# Patient Record
Sex: Male | Born: 1980
Health system: Southern US, Community
[De-identification: ages and names within clinical notes are randomized; demographics above are authoritative.]

## PROBLEM LIST (undated history)

## (undated) DIAGNOSIS — F329 Major depressive disorder, single episode, unspecified: Secondary | ICD-10-CM

## (undated) DIAGNOSIS — R7303 Prediabetes: Secondary | ICD-10-CM

## (undated) DIAGNOSIS — D869 Sarcoidosis, unspecified: Secondary | ICD-10-CM

## (undated) DIAGNOSIS — F419 Anxiety disorder, unspecified: Secondary | ICD-10-CM

## (undated) DIAGNOSIS — G4730 Sleep apnea, unspecified: Secondary | ICD-10-CM

## (undated) DIAGNOSIS — J189 Pneumonia, unspecified organism: Secondary | ICD-10-CM

## (undated) DIAGNOSIS — R0600 Dyspnea, unspecified: Secondary | ICD-10-CM

## (undated) DIAGNOSIS — R519 Headache, unspecified: Secondary | ICD-10-CM

## (undated) DIAGNOSIS — F32A Depression, unspecified: Secondary | ICD-10-CM

## (undated) DIAGNOSIS — L309 Dermatitis, unspecified: Secondary | ICD-10-CM

## (undated) DIAGNOSIS — R51 Headache: Secondary | ICD-10-CM

## (undated) DIAGNOSIS — E669 Obesity, unspecified: Secondary | ICD-10-CM

## (undated) HISTORY — PX: OTHER SURGICAL HISTORY: SHX169

## (undated) HISTORY — PX: ARTHROSCOPY KNEE W/ DRILLING: SUR92

## (undated) HISTORY — DX: Dermatitis, unspecified: L30.9

---
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
## 2003-06-20 ENCOUNTER — Emergency Department (HOSPITAL_COMMUNITY): Admission: EM | Admit: 2003-06-20 | Discharge: 2003-06-20 | Payer: Self-pay | Admitting: Emergency Medicine

## 2005-02-20 ENCOUNTER — Emergency Department (HOSPITAL_COMMUNITY): Admission: EM | Admit: 2005-02-20 | Discharge: 2005-02-20 | Payer: Self-pay | Admitting: Emergency Medicine

## 2008-03-29 ENCOUNTER — Emergency Department (HOSPITAL_COMMUNITY): Admission: EM | Admit: 2008-03-29 | Discharge: 2008-03-29 | Payer: Self-pay | Admitting: Emergency Medicine

## 2009-09-21 ENCOUNTER — Emergency Department (HOSPITAL_COMMUNITY): Admission: EM | Admit: 2009-09-21 | Discharge: 2009-09-21 | Payer: Self-pay | Admitting: Family Medicine

## 2011-07-04 ENCOUNTER — Emergency Department (HOSPITAL_COMMUNITY)
Admission: EM | Admit: 2011-07-04 | Discharge: 2011-07-04 | Disposition: A | Discharge status: Home / Self Care | Payer: BC Managed Care – PPO | Attending: Emergency Medicine | Admitting: Emergency Medicine

## 2011-07-04 ENCOUNTER — Encounter (HOSPITAL_COMMUNITY): Payer: Self-pay | Admitting: Emergency Medicine

## 2011-07-04 DIAGNOSIS — R51 Headache: Secondary | ICD-10-CM | POA: Insufficient documentation

## 2011-07-04 DIAGNOSIS — F17200 Nicotine dependence, unspecified, uncomplicated: Secondary | ICD-10-CM | POA: Insufficient documentation

## 2011-07-04 HISTORY — DX: Obesity, unspecified: E66.9

## 2011-07-04 LAB — GLUCOSE, CAPILLARY: Glucose-Capillary: 104 mg/dL — ABNORMAL HIGH (ref 70–99)

## 2011-07-04 MED ORDER — IBUPROFEN 200 MG PO TABS
400.0000 mg | ORAL_TABLET | Freq: Once | ORAL | Status: AC
  Administered 2011-07-04: 400 mg via ORAL
  Filled 2011-07-04: qty 2

## 2011-07-04 MED ORDER — DIPHENHYDRAMINE HCL 25 MG PO CAPS
25.0000 mg | ORAL_CAPSULE | Freq: Once | ORAL | Status: AC
  Administered 2011-07-04: 25 mg via ORAL
  Filled 2011-07-04: qty 1

## 2011-07-04 MED ORDER — METOCLOPRAMIDE HCL 10 MG PO TABS
10.0000 mg | ORAL_TABLET | Freq: Once | ORAL | Status: AC
  Administered 2011-07-04: 10 mg via ORAL
  Filled 2011-07-04: qty 1

## 2011-07-04 NOTE — ED Notes (Signed)
Pt c/o severe headache beginning yesterday at noon. Pt states that he was informed he had high blood pressure. Pt c/o increase weakness since headache began. No nausea, no vomiting.

## 2011-07-04 NOTE — Discharge Instructions (Signed)
Headaches, Frequently Asked Questions MIGRAINE HEADACHES Q: What is migraine? What causes it? How can I treat it? A: Generally, migraine headaches begin as a dull ache. Then they develop into a constant, throbbing, and pulsating pain. You may experience pain at the temples. You may experience pain at the front or back of one or both sides of the head. The pain is usually accompanied by a combination of:  Nausea.   Vomiting.   Sensitivity to light and noise.  Some people (about 15%) experience an aura (see below) before an attack. The cause of migraine is believed to be chemical reactions in the brain. Treatment for migraine may include over-the-counter or prescription medications. It may also include self-help techniques. These include relaxation training and biofeedback.  Q: What is an aura? A: About 15% of people with migraine get an "aura". This is a sign of neurological symptoms that occur before a migraine headache. You may see wavy or jagged lines, dots, or flashing lights. You might experience tunnel vision or blind spots in one or both eyes. The aura can include visual or auditory hallucinations (something imagined). It may include disruptions in smell (such as strange odors), taste or touch. Other symptoms include:  Numbness.   A "pins and needles" sensation.   Difficulty in recalling or speaking the correct word.  These neurological events may last as long as 60 minutes. These symptoms will fade as the headache begins. Q: What is a trigger? A: Certain physical or environmental factors can lead to or "trigger" a migraine. These include:  Foods.   Hormonal changes.   Weather.   Stress.  It is important to remember that triggers are different for everyone. To help prevent migraine attacks, you need to figure out which triggers affect you. Keep a headache diary. This is a good way to track triggers. The diary will help you talk to your healthcare professional about your  condition. Q: Does weather affect migraines? A: Bright sunshine, hot, humid conditions, and drastic changes in barometric pressure may lead to, or "trigger," a migraine attack in some people. But studies have shown that weather does not act as a trigger for everyone with migraines. Q: What is the link between migraine and hormones? A: Hormones start and regulate many of your body's functions. Hormones keep your body in balance within a constantly changing environment. The levels of hormones in your body are unbalanced at times. Examples are during menstruation, pregnancy, or menopause. That can lead to a migraine attack. In fact, about three quarters of all women with migraine report that their attacks are related to the menstrual cycle.  Q: Is there an increased risk of stroke for migraine sufferers? A: The likelihood of a migraine attack causing a stroke is very remote. That is not to say that migraine sufferers cannot have a stroke associated with their migraines. In persons under age 40, the most common associated factor for stroke is migraine headache. But over the course of a person's normal life span, the occurrence of migraine headache may actually be associated with a reduced risk of dying from cerebrovascular disease due to stroke.  Q: What are acute medications for migraine? A: Acute medications are used to treat the pain of the headache after it has started. Examples over-the-counter medications, NSAIDs, ergots, and triptans.  Q: What are the triptans? A: Triptans are the newest class of abortive medications. They are specifically targeted to treat migraine. Triptans are vasoconstrictors. They moderate some chemical reactions in the brain.   The triptans work on receptors in your brain. Triptans help to restore the balance of a neurotransmitter called serotonin. Fluctuations in levels of serotonin are thought to be a main cause of migraine.  Q: Are over-the-counter medications for migraine  effective? A: Over-the-counter, or "OTC," medications may be effective in relieving mild to moderate pain and associated symptoms of migraine. But you should see your caregiver before beginning any treatment regimen for migraine.  Q: What are preventive medications for migraine? A: Preventive medications for migraine are sometimes referred to as "prophylactic" treatments. They are used to reduce the frequency, severity, and length of migraine attacks. Examples of preventive medications include antiepileptic medications, antidepressants, beta-blockers, calcium channel blockers, and NSAIDs (nonsteroidal anti-inflammatory drugs). Q: Why are anticonvulsants used to treat migraine? A: During the past few years, there has been an increased interest in antiepileptic drugs for the prevention of migraine. They are sometimes referred to as "anticonvulsants". Both epilepsy and migraine may be caused by similar reactions in the brain.  Q: Why are antidepressants used to treat migraine? A: Antidepressants are typically used to treat people with depression. They may reduce migraine frequency by regulating chemical levels, such as serotonin, in the brain.  Q: What alternative therapies are used to treat migraine? A: The term "alternative therapies" is often used to describe treatments considered outside the scope of conventional Western medicine. Examples of alternative therapy include acupuncture, acupressure, and yoga. Another common alternative treatment is herbal therapy. Some herbs are believed to relieve headache pain. Always discuss alternative therapies with your caregiver before proceeding. Some herbal products contain arsenic and other toxins. TENSION HEADACHES Q: What is a tension-type headache? What causes it? How can I treat it? A: Tension-type headaches occur randomly. They are often the result of temporary stress, anxiety, fatigue, or anger. Symptoms include soreness in your temples, a tightening  band-like sensation around your head (a "vice-like" ache). Symptoms can also include a pulling feeling, pressure sensations, and contracting head and neck muscles. The headache begins in your forehead, temples, or the back of your head and neck. Treatment for tension-type headache may include over-the-counter or prescription medications. Treatment may also include self-help techniques such as relaxation training and biofeedback. CLUSTER HEADACHES Q: What is a cluster headache? What causes it? How can I treat it? A: Cluster headache gets its name because the attacks come in groups. The pain arrives with little, if any, warning. It is usually on one side of the head. A tearing or bloodshot eye and a runny nose on the same side of the headache may also accompany the pain. Cluster headaches are believed to be caused by chemical reactions in the brain. They have been described as the most severe and intense of any headache type. Treatment for cluster headache includes prescription medication and oxygen. SINUS HEADACHES Q: What is a sinus headache? What causes it? How can I treat it? A: When a cavity in the bones of the face and skull (a sinus) becomes inflamed, the inflammation will cause localized pain. This condition is usually the result of an allergic reaction, a tumor, or an infection. If your headache is caused by a sinus blockage, such as an infection, you will probably have a fever. An x-ray will confirm a sinus blockage. Your caregiver's treatment might include antibiotics for the infection, as well as antihistamines or decongestants.  REBOUND HEADACHES Q: What is a rebound headache? What causes it? How can I treat it? A: A pattern of taking acute headache medications too   often can lead to a condition known as "rebound headache." A pattern of taking too much headache medication includes taking it more than 2 days per week or in excessive amounts. That means more than the label or a caregiver advises.  With rebound headaches, your medications not only stop relieving pain, they actually begin to cause headaches. Doctors treat rebound headache by tapering the medication that is being overused. Sometimes your caregiver will gradually substitute a different type of treatment or medication. Stopping may be a challenge. Regularly overusing a medication increases the potential for serious side effects. Consult a caregiver if you regularly use headache medications more than 2 days per week or more than the label advises. ADDITIONAL QUESTIONS AND ANSWERS Q: What is biofeedback? A: Biofeedback is a self-help treatment. Biofeedback uses special equipment to monitor your body's involuntary physical responses. Biofeedback monitors:  Breathing.   Pulse.   Heart rate.   Temperature.   Muscle tension.   Brain activity.  Biofeedback helps you refine and perfect your relaxation exercises. You learn to control the physical responses that are related to stress. Once the technique has been mastered, you do not need the equipment any more. Q: Are headaches hereditary? A: Four out of five (80%) of people that suffer report a family history of migraine. Scientists are not sure if this is genetic or a family predisposition. Despite the uncertainty, a child has a 50% chance of having migraine if one parent suffers. The child has a 75% chance if both parents suffer.  Q: Can children get headaches? A: By the time they reach high school, most young people have experienced some type of headache. Many safe and effective approaches or medications can prevent a headache from occurring or stop it after it has begun.  Q: What type of doctor should I see to diagnose and treat my headache? A: Start with your primary caregiver. Discuss his or her experience and approach to headaches. Discuss methods of classification, diagnosis, and treatment. Your caregiver may decide to recommend you to a headache specialist, depending upon  your symptoms or other physical conditions. Having diabetes, allergies, etc., may require a more comprehensive and inclusive approach to your headache. The National Headache Foundation will provide, upon request, a list of [facility] physician members in your state. Document Released: 07/20/2003 Document Revised: 01/09/2011 Document Reviewed: 12/28/2007 [facility] Patient Information 2012 [place], [place].General Headache, Without Cause A general headache has no specific cause. These headaches are not life-threatening. They will not lead to other types of headaches. HOME CARE   Make and keep follow-up visits with your doctor.   Only take medicine as told by your doctor.   Try to relax, get a massage, or use your thoughts to control your body (biofeedback).   Apply cold or heat to the head and neck. Apply 3 or 4 times a day or as needed.  Finding out the results of your test Ask when your test results will be ready. Make sure you get your test results. GET HELP RIGHT AWAY IF:   You have problems with medicine.   Your medicine does not help relieve pain.   Your headache changes or becomes worse.   You feel sick to your stomach (nauseous) or throw up (vomit).   You have a temperature by mouth above 102 F (38.9 C), not controlled by medicine.   Your have a stiff neck.   You have vision loss.   You have muscle weakness.   You lose control of your  muscles.   You lose balance or have trouble walking.   You feel like you are going to pass out (faint).  MAKE SURE YOU:   Understand these instructions.   Will watch this condition.   Will get help right away if you are not doing well or get worse.  Document Released: 02/06/2008 Document Revised: 01/09/2011 Document Reviewed: 02/06/2008 [facility] Patient Information 2012 [place], [place].  RESOURCE GUIDE  Dental Problems  Patients with Medicaid: [facility] [phone]                                            [phone] [address] Phone:  [phone]                                                  Phone:  [phone]  If unable to pay or uninsured, contact:  [facility] or [facility]. to become qualified for the adult dental clinic.  Chronic Pain Problems Contact [name]  [phone] Patients need to be referred by their primary care doctor.  Insufficient Money for Medicine Contact [employer]:  call "211" or [facility] [phone]) 023-0104.  No Primary Care Doctor Call [facility]  234-726-6418 Other agencies that provide inexpensive medical care    [facility]  [phone]    [facility]  [phone]    [facility]  [phone]    [facility]  [phone]    [facility]  [phone]    [facility]  [phone]  Psychological Services [facility]  [phone] [facility] Services  [phone] [facility] Mental [facility]   9841032621 (emergency services 680-575-1071)  Substance Abuse Resources [facility]  5415162938 [facility] [phone] [facility] [place] [phone] Floydene Flock [phone] Residential & Outpatient Substance Abuse Program  4062066255  Abuse/Neglect [facility] Child Abuse Hotline (757)855-8575 [facility] Child Abuse Hotline (810)690-1233 (After Hours)  Emergency Shelter [facility] 607-156-9647  Maternity Homes Room at the [place] [facility] (623)227-7040 [facility] ([phone]  MRSA Hotline #:   908 717 5389    [facility] Resources  [facility]     [employer]                          [facility] Dept. [address] [place]                       [address]      [address]  Patrecia Pace  [facility] Phone:  [phone]                                    Phone:  [phone]                 Phone:  [place] Phone:  [place] [phone] (After Hours)

## 2011-07-04 NOTE — ED Provider Notes (Signed)
History    30yM with HA. Gradual onset yesterday afternoon. Cannot remember what was doing at onset. Pain is diffuse, but worse in frontal region. Denies trauma. No appreciable exacerbating or relieving factors.  No n/v. No fever or chills. No neck pain or stiffness. No numbness, tingling or loss of strength. No sick contacts. No n/v.  Not on blood thinning medication. Not aware of family hx of cerebral aneurysm. No significant HA history. Pt concerned that may be related to BP because told on recent outpt evaluation that was high at that time. Pt also consider he may be diabetic.  CSN: [id number]  Arrival date & time 07/04/11  1034   First MD Initiated Contact with Patient 07/04/11 1041      Chief Complaint  Patient presents with  . Headache    (Consider location/radiation/quality/duration/timing/severity/associated sxs/prior treatment) HPI  Past Medical History  Diagnosis Date  . Obese     Past Surgical History  Procedure Date  . Arthroscopy knee w/ drilling     Family History  Problem Relation Age of Onset  . Hypertension Father   . Diabetes Father   . Stroke Father   . Cancer Paternal Uncle     History  Substance Use Topics  . Smoking status: Current Some Day Smoker -- 1.0 packs/day for 10 years    Types: Cigarettes  . Smokeless tobacco: Never Used  . Alcohol Use: No      Review of Systems   Review of symptoms negative unless otherwise noted in HPI.   Allergies  Review of patient's allergies indicates no known allergies.  Home Medications   Current Outpatient Rx  Name Route Sig Dispense Refill  . NAPROXEN SODIUM 220 MG PO TABS Oral Take 220 mg by mouth 2 (two) times daily with a meal.    . OXYCODONE-ACETAMINOPHEN 10-325 MG PO TABS Oral Take 1 tablet by mouth every 6 (six) hours as needed. pain      BP 112/46  Pulse 78  Temp(Src) 97.8 F (36.6 C) (Oral)  Ht 5\' 8"  (1.727 m)  Wt 470 lb (213.191 kg)  BMI 71.46 kg/m2  SpO2 97%  Physical Exam    Nursing note and vitals reviewed. Constitutional: He is oriented to person, place, and time. He appears well-developed and well-nourished. No distress.       Sitting next to bed. NAD. Morbidly obese.  HENT:  Head: Normocephalic and atraumatic.  Right Ear: External ear normal.  Left Ear: External ear normal.  Mouth/Throat: Oropharynx is clear and moist.       No facial tenderness.  Eyes: Conjunctivae and EOM are normal. Pupils are equal, round, and reactive to light. Right eye exhibits no discharge. Left eye exhibits no discharge.  Neck: Normal range of motion. Neck supple.  Cardiovascular: Normal rate, regular rhythm and normal heart sounds.  Exam reveals no gallop and no friction rub.   No murmur heard. Pulmonary/Chest: Effort normal and breath sounds normal. No respiratory distress.  Abdominal: Soft. He exhibits no distension. There is no tenderness.  Musculoskeletal: Normal range of motion. He exhibits no edema and no tenderness.  Lymphadenopathy:    He has no cervical adenopathy.  Neurological: He is alert and oriented to person, place, and time. No cranial nerve deficit. He exhibits normal muscle tone. Coordination normal.       Steady gait. Good finger to nose testing bilaterally.  Skin: Skin is warm and dry.  Psychiatric: He has a normal mood and affect. His behavior is normal.  Thought content normal.    ED Course  Procedures (including critical care time)  Labs Reviewed  GLUCOSE, CAPILLARY - Abnormal; Notable for the following:    Glucose-Capillary 104 (*)    All other components within normal limits   No results found.   1. Headache       MDM  30yM with HA. Suspect primary HA. Consider secondary causes such as infectious, bleed or mass but doubt. Consider venous thrombosis, carotid/vertebral dissection or ocular etiology but doubt as well. No contacts with similar to suggest CO poisoning. No hx of trauma. Not sudden onset or worst HA of life. Not on blood thinning  medications. Pt concerned about BP being elevated when checked as outpt recently but non concerning today. Also concerned about possible DM but less likely with nonfasting sugar of 104. Non focal neurological exam. Afebrile and no signs of meningismus. Plan continued symptomatic tx and fu as needed. Return precautions discussed.        Raeford Razor, MD 07/08/11 [phone]

## 2013-01-07 ENCOUNTER — Emergency Department (HOSPITAL_COMMUNITY): Payer: Commercial Managed Care - PPO

## 2013-01-07 ENCOUNTER — Encounter (HOSPITAL_COMMUNITY): Payer: Self-pay

## 2013-01-07 ENCOUNTER — Emergency Department (HOSPITAL_COMMUNITY)
Admission: EM | Admit: 2013-01-07 | Discharge: 2013-01-07 | Disposition: A | Discharge status: Home / Self Care | Payer: Commercial Managed Care - PPO | Attending: Emergency Medicine | Admitting: Emergency Medicine

## 2013-01-07 DIAGNOSIS — H5789 Other specified disorders of eye and adnexa: Secondary | ICD-10-CM | POA: Insufficient documentation

## 2013-01-07 DIAGNOSIS — R110 Nausea: Secondary | ICD-10-CM | POA: Insufficient documentation

## 2013-01-07 DIAGNOSIS — R220 Localized swelling, mass and lump, head: Secondary | ICD-10-CM | POA: Insufficient documentation

## 2013-01-07 DIAGNOSIS — E669 Obesity, unspecified: Secondary | ICD-10-CM | POA: Insufficient documentation

## 2013-01-07 DIAGNOSIS — J029 Acute pharyngitis, unspecified: Secondary | ICD-10-CM | POA: Insufficient documentation

## 2013-01-07 DIAGNOSIS — F17200 Nicotine dependence, unspecified, uncomplicated: Secondary | ICD-10-CM | POA: Insufficient documentation

## 2013-01-07 DIAGNOSIS — R509 Fever, unspecified: Secondary | ICD-10-CM | POA: Insufficient documentation

## 2013-01-07 DIAGNOSIS — M542 Cervicalgia: Secondary | ICD-10-CM | POA: Insufficient documentation

## 2013-01-07 DIAGNOSIS — R519 Headache, unspecified: Secondary | ICD-10-CM

## 2013-01-07 DIAGNOSIS — R51 Headache: Secondary | ICD-10-CM | POA: Insufficient documentation

## 2013-01-07 MED ORDER — KETOROLAC TROMETHAMINE 30 MG/ML IJ SOLN
30.0000 mg | Freq: Once | INTRAMUSCULAR | Status: AC
  Administered 2013-01-07: 30 mg via INTRAVENOUS
  Filled 2013-01-07: qty 1

## 2013-01-07 MED ORDER — SODIUM CHLORIDE 0.9 % IV BOLUS (SEPSIS)
1000.0000 mL | Freq: Once | INTRAVENOUS | Status: AC
  Administered 2013-01-07: 1000 mL via INTRAVENOUS

## 2013-01-07 MED ORDER — DIPHENHYDRAMINE HCL 50 MG/ML IJ SOLN
25.0000 mg | Freq: Once | INTRAMUSCULAR | Status: AC
  Administered 2013-01-07: 50 mg via INTRAVENOUS
  Filled 2013-01-07 (×2): qty 1

## 2013-01-07 MED ORDER — PROCHLORPERAZINE EDISYLATE 5 MG/ML IJ SOLN
10.0000 mg | Freq: Once | INTRAMUSCULAR | Status: AC
  Administered 2013-01-07: 10 mg via INTRAVENOUS
  Filled 2013-01-07: qty 2

## 2013-01-07 NOTE — Progress Notes (Signed)
Patient reports he no longer has [employer] and [employer].  He now has [employer] for insurance.  EDCM confirmed this with registration.  Patient also reports he does not have a pcp.  EDCM instructed patient to call the phone number on the back of his insurance card to help him fingd a doctor who is close to him and who is within network.  Patient thankful for information.

## 2013-01-07 NOTE — ED Notes (Signed)
Unable to do CT

## 2013-01-07 NOTE — ED Provider Notes (Signed)
CSN: [id number]     Arrival date & time 01/07/13  1744 History   First MD Initiated Contact with Patient 01/07/13 1836     Chief Complaint  Patient presents with  . Headache  . Nausea   (Consider location/radiation/quality/duration/timing/severity/associated sxs/prior Treatment) Patient is a 32 y.o. male presenting with headaches.  Headache Associated symptoms: abdominal pain, fever, nausea, neck pain, sore throat and vomiting (vomited into mouth and swallowed it.)   Associated symptoms: no congestion, no cough, no diarrhea, no drainage, no ear pain, no pain, no neck stiffness, no photophobia and no sinus pressure   Pt c/o waking up during the night with a headache described as pressure and throbbing behind his eyes and pressure and tightness in his neck. Pt has never had a HA like this before. Pain has stayed about the same throughout the day (8/10) and c/o photophobia. Pt also has throat pain, sore neck, nausea, vomiting into his mouth, chills, fever, watery eyes, swollen eyelids. Pt denies eye pain, visual changes, eye itching, chest pain, SOB, cough, constipation, diarrhea, sick contacts, new medications. Pt says nothing makes his symptoms better or worse.  Past Medical History  Diagnosis Date  . Obese    Past Surgical History  Procedure Laterality Date  . Arthroscopy knee w/ drilling     Family History  Problem Relation Age of Onset  . Hypertension Father   . Diabetes Father   . Stroke Father   . Cancer Paternal Uncle    History  Substance Use Topics  . Smoking status: Current Some Day Smoker -- 1.00 packs/day for 10 years    Types: Cigarettes  . Smokeless tobacco: Never Used  . Alcohol Use: No    Review of Systems  Constitutional: Positive for fever and chills.  HENT: Positive for sore throat, facial swelling and neck pain. Negative for ear pain, congestion, rhinorrhea, neck stiffness, postnasal drip, sinus pressure and ear discharge.   Eyes: Positive for redness.  Negative for photophobia, pain, discharge, itching and visual disturbance.  Respiratory: Negative for apnea, cough, chest tightness and shortness of breath.   Cardiovascular: Negative for chest pain.  Gastrointestinal: Positive for nausea, vomiting (vomited into mouth and swallowed it.) and abdominal pain. Negative for diarrhea, constipation and blood in stool.  Neurological: Positive for headaches.  Marland Kitchen.All other systems negative except as documented in the HPI. All pertinent positives and negatives as reviewed in the HPI.   Allergies  Review of patient's allergies indicates no known allergies.  Home Medications   Current Outpatient Rx  Name  Route  Sig  Dispense  Refill  . naproxen sodium (ANAPROX) 220 MG tablet   Oral   Take 440 mg by mouth as needed.         . traMADol (ULTRAM) 50 MG tablet   Oral   Take 50-100 mg by mouth every 6 (six) hours as needed for pain.          . naproxen sodium (ANAPROX) 220 MG tablet   Oral   Take 220 mg by mouth 2 (two) times daily with a meal.         . oxyCODONE-acetaminophen (PERCOCET) 10-325 MG per tablet   Oral   Take 1 tablet by mouth every 6 (six) hours as needed. pain          BP 149/78  Pulse 93  Temp(Src) 98 F (36.7 C) (Oral)  Resp 20  Ht 5\' 8"  (1.727 m)  Wt 535 lb (242.674 kg)  BMI 81.37  kg/m2  SpO2 95% Physical Exam  Constitutional: He is oriented to person, place, and time. He appears well-developed and well-nourished.  HENT:  Head: Normocephalic and atraumatic.  Right Ear: Tympanic membrane, external ear and ear canal normal.  Left Ear: Tympanic membrane, external ear and ear canal normal.  Nose: Nose normal.  Mouth/Throat: Uvula is midline, oropharynx is clear and moist and mucous membranes are normal. No oropharyngeal exudate.  Eyes: EOM and lids are normal. Pupils are equal, round, and reactive to light. Right conjunctiva is injected. Left conjunctiva is injected.  Neck: Normal range of motion. Neck supple.   Cardiovascular: Normal rate, regular rhythm, normal heart sounds and normal pulses.   Pulmonary/Chest: Effort normal and breath sounds normal.  Abdominal: Soft. Bowel sounds are normal. There is no tenderness. There is no rebound.  Musculoskeletal: Normal range of motion.       Cervical back: He exhibits normal range of motion, no tenderness, no bony tenderness, no edema, no deformity and no pain.  Lymphadenopathy:       Head (right side): No submental, no submandibular, no tonsillar, no preauricular, no posterior auricular and no occipital adenopathy present.       Head (left side): No submental, no submandibular, no tonsillar, no preauricular, no posterior auricular and no occipital adenopathy present.    He has no cervical adenopathy.  Neurological: He is alert and oriented to person, place, and time. He has normal strength. No cranial nerve deficit or sensory deficit.  Psychiatric: He has a normal mood and affect. His behavior is normal. Judgment and thought content normal.    ED Course  Procedures (including critical care time) Labs Review Labs Reviewed - No data to display Imaging Review No results found.  MDM  No diagnosis found. CT could be be performed due to patients body habitus. Pt HA pain has improved from 8/10 to 3/10 with medications.    20:25- Patient has no pain. The patient has no neurological deficits on exam. The patient understands that this could still represent something more serious but seems less likely based on his exam.   Carlyle Dolly, PA-C 01/08/13 [phone]

## 2013-01-07 NOTE — ED Notes (Signed)
Pt complains of a headache since last night and he feels like he could vomit

## 2013-01-12 NOTE — ED Provider Notes (Signed)
Medical screening examination/treatment/procedure(s) were performed by non-physician practitioner and as supervising physician I was immediately available for consultation/collaboration.  Ranada Vigorito R. Neely Cecena, MD 01/12/13 1434 

## 2013-12-24 ENCOUNTER — Encounter (HOSPITAL_COMMUNITY): Payer: Self-pay | Admitting: Emergency Medicine

## 2013-12-24 ENCOUNTER — Emergency Department (HOSPITAL_COMMUNITY)
Admission: EM | Admit: 2013-12-24 | Discharge: 2013-12-24 | Disposition: A | Discharge status: Home / Self Care | Payer: Commercial Managed Care - PPO | Attending: Emergency Medicine | Admitting: Emergency Medicine

## 2013-12-24 DIAGNOSIS — Y9289 Other specified places as the place of occurrence of the external cause: Secondary | ICD-10-CM | POA: Insufficient documentation

## 2013-12-24 DIAGNOSIS — L259 Unspecified contact dermatitis, unspecified cause: Secondary | ICD-10-CM | POA: Diagnosis not present

## 2013-12-24 DIAGNOSIS — M546 Pain in thoracic spine: Secondary | ICD-10-CM

## 2013-12-24 DIAGNOSIS — F17200 Nicotine dependence, unspecified, uncomplicated: Secondary | ICD-10-CM | POA: Diagnosis not present

## 2013-12-24 DIAGNOSIS — IMO0002 Reserved for concepts with insufficient information to code with codable children: Secondary | ICD-10-CM | POA: Insufficient documentation

## 2013-12-24 DIAGNOSIS — E669 Obesity, unspecified: Secondary | ICD-10-CM | POA: Insufficient documentation

## 2013-12-24 DIAGNOSIS — Y9389 Activity, other specified: Secondary | ICD-10-CM | POA: Diagnosis not present

## 2013-12-24 MED ORDER — METHOCARBAMOL 500 MG PO TABS: 500.0000 mg | ORAL_TABLET | Freq: Two times a day (BID) | ORAL | Status: DC

## 2013-12-24 MED ORDER — HYDROCORTISONE 2.5 % EX LOTN: TOPICAL_LOTION | Freq: Two times a day (BID) | CUTANEOUS | Status: DC
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
## 2013-12-26 NOTE — ED Provider Notes (Signed)
Medical screening examination/treatment/procedure(s) were performed by non-physician practitioner and as supervising physician I was immediately available for consultation/collaboration.   EKG Interpretation None        Mirian Mo, MD 12/26/13 1442

## 2014-10-07 ENCOUNTER — Ambulatory Visit: Payer: Commercial Managed Care - PPO | Attending: Otolaryngology

## 2014-10-07 DIAGNOSIS — F5101 Primary insomnia: Secondary | ICD-10-CM | POA: Diagnosis not present

## 2014-10-07 DIAGNOSIS — G4733 Obstructive sleep apnea (adult) (pediatric): Secondary | ICD-10-CM | POA: Diagnosis not present

## 2016-06-03 DIAGNOSIS — L259 Unspecified contact dermatitis, unspecified cause: Secondary | ICD-10-CM | POA: Diagnosis not present

## 2016-06-03 DIAGNOSIS — M545 Low back pain: Secondary | ICD-10-CM | POA: Diagnosis not present

## 2016-06-03 DIAGNOSIS — M2550 Pain in unspecified joint: Secondary | ICD-10-CM | POA: Diagnosis not present

## 2016-06-07 DIAGNOSIS — R21 Rash and other nonspecific skin eruption: Secondary | ICD-10-CM | POA: Diagnosis not present

## 2016-07-05 ENCOUNTER — Emergency Department (HOSPITAL_COMMUNITY)
Admission: EM | Admit: 2016-07-05 | Discharge: 2016-07-05 | Disposition: A | Discharge status: Home / Self Care | Payer: No Typology Code available for payment source | Attending: Emergency Medicine | Admitting: Emergency Medicine

## 2016-07-05 ENCOUNTER — Encounter (HOSPITAL_COMMUNITY): Payer: Self-pay

## 2016-07-05 DIAGNOSIS — Y92410 Unspecified street and highway as the place of occurrence of the external cause: Secondary | ICD-10-CM | POA: Insufficient documentation

## 2016-07-05 DIAGNOSIS — Y939 Activity, unspecified: Secondary | ICD-10-CM | POA: Insufficient documentation

## 2016-07-05 DIAGNOSIS — R51 Headache: Secondary | ICD-10-CM | POA: Insufficient documentation

## 2016-07-05 DIAGNOSIS — Z79899 Other long term (current) drug therapy: Secondary | ICD-10-CM | POA: Diagnosis not present

## 2016-07-05 DIAGNOSIS — M542 Cervicalgia: Secondary | ICD-10-CM | POA: Diagnosis not present

## 2016-07-05 DIAGNOSIS — Y999 Unspecified external cause status: Secondary | ICD-10-CM | POA: Insufficient documentation

## 2016-07-05 DIAGNOSIS — L259 Unspecified contact dermatitis, unspecified cause: Secondary | ICD-10-CM | POA: Diagnosis not present

## 2016-07-05 DIAGNOSIS — M7918 Myalgia, other site: Secondary | ICD-10-CM

## 2016-07-05 DIAGNOSIS — M546 Pain in thoracic spine: Secondary | ICD-10-CM | POA: Diagnosis not present

## 2016-07-05 DIAGNOSIS — F17210 Nicotine dependence, cigarettes, uncomplicated: Secondary | ICD-10-CM | POA: Insufficient documentation

## 2016-07-05 DIAGNOSIS — M2550 Pain in unspecified joint: Secondary | ICD-10-CM | POA: Diagnosis not present

## 2016-07-05 DIAGNOSIS — M545 Low back pain: Secondary | ICD-10-CM | POA: Diagnosis not present

## 2016-07-05 MED ORDER — NAPROXEN 500 MG PO TABS: 500.0000 mg | ORAL_TABLET | Freq: Two times a day (BID) | ORAL | 0 refills | Status: DC

## 2016-07-05 MED ORDER — METHOCARBAMOL 500 MG PO TABS: 500.0000 mg | ORAL_TABLET | Freq: Two times a day (BID) | ORAL | 0 refills | Status: DC
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
## 2016-07-16 DIAGNOSIS — G4733 Obstructive sleep apnea (adult) (pediatric): Secondary | ICD-10-CM | POA: Diagnosis not present

## 2016-07-16 DIAGNOSIS — R5381 Other malaise: Secondary | ICD-10-CM | POA: Diagnosis not present

## 2016-07-16 DIAGNOSIS — R5383 Other fatigue: Secondary | ICD-10-CM | POA: Diagnosis not present

## 2016-08-02 DIAGNOSIS — M545 Low back pain: Secondary | ICD-10-CM | POA: Diagnosis not present

## 2016-08-02 DIAGNOSIS — L259 Unspecified contact dermatitis, unspecified cause: Secondary | ICD-10-CM | POA: Diagnosis not present

## 2016-08-02 DIAGNOSIS — M2550 Pain in unspecified joint: Secondary | ICD-10-CM | POA: Diagnosis not present

## 2016-08-02 DIAGNOSIS — Z79891 Long term (current) use of opiate analgesic: Secondary | ICD-10-CM | POA: Diagnosis not present

## 2017-01-02 ENCOUNTER — Emergency Department (HOSPITAL_BASED_OUTPATIENT_CLINIC_OR_DEPARTMENT_OTHER): Admit: 2017-01-02 | Discharge: 2017-01-02 | Disposition: A | Discharge status: Home / Self Care | Payer: Self-pay

## 2017-01-02 ENCOUNTER — Encounter (HOSPITAL_COMMUNITY): Payer: Self-pay

## 2017-01-02 ENCOUNTER — Emergency Department (HOSPITAL_COMMUNITY): Payer: Self-pay

## 2017-01-02 ENCOUNTER — Emergency Department (HOSPITAL_COMMUNITY)
Admission: EM | Admit: 2017-01-02 | Discharge: 2017-01-02 | Disposition: A | Discharge status: Home / Self Care | Payer: Self-pay | Attending: Emergency Medicine | Admitting: Emergency Medicine

## 2017-01-02 DIAGNOSIS — M7989 Other specified soft tissue disorders: Secondary | ICD-10-CM

## 2017-01-02 DIAGNOSIS — Z79899 Other long term (current) drug therapy: Secondary | ICD-10-CM | POA: Insufficient documentation

## 2017-01-02 DIAGNOSIS — J189 Pneumonia, unspecified organism: Secondary | ICD-10-CM | POA: Insufficient documentation

## 2017-01-02 DIAGNOSIS — Z87891 Personal history of nicotine dependence: Secondary | ICD-10-CM | POA: Insufficient documentation

## 2017-01-02 DIAGNOSIS — R7989 Other specified abnormal findings of blood chemistry: Secondary | ICD-10-CM | POA: Insufficient documentation

## 2017-01-02 LAB — CBC WITH DIFFERENTIAL/PLATELET
BASOS ABS: 0 10*3/uL (ref 0.0–0.1)
Basophils Relative: 0 %
EOS ABS: 0.3 10*3/uL (ref 0.0–0.7)
Eosinophils Relative: 5 %
HCT: 41.8 % (ref 39.0–52.0)
HEMOGLOBIN: 14.3 g/dL (ref 13.0–17.0)
Lymphocytes Relative: 31 %
Lymphs Abs: 2.1 10*3/uL (ref 0.7–4.0)
MCH: 29.9 pg (ref 26.0–34.0)
MCHC: 34.2 g/dL (ref 30.0–36.0)
MCV: 87.3 fL (ref 78.0–100.0)
Monocytes Absolute: 0.5 10*3/uL (ref 0.1–1.0)
Monocytes Relative: 8 %
NEUTROS ABS: 3.8 10*3/uL (ref 1.7–7.7)
NEUTROS PCT: 56 %
Platelets: 271 10*3/uL (ref 150–400)
RBC: 4.79 MIL/uL (ref 4.22–5.81)
RDW: 13.6 % (ref 11.5–15.5)
WBC: 6.7 10*3/uL (ref 4.0–10.5)

## 2017-01-02 LAB — BASIC METABOLIC PANEL
Anion gap: 7 (ref 5–15)
BUN: 10 mg/dL (ref 6–20)
CALCIUM: 9 mg/dL (ref 8.9–10.3)
CHLORIDE: 107 mmol/L (ref 101–111)
CO2: 26 mmol/L (ref 22–32)
CREATININE: 0.81 mg/dL (ref 0.61–1.24)
GFR calc Af Amer: >60 mL/min (ref >60)
GFR calc non Af Amer: >60 mL/min (ref >60)
GLUCOSE: 90 mg/dL (ref 65–99)
Potassium: 3.8 mmol/L (ref 3.5–5.1)
Sodium: 140 mmol/L (ref 135–145)

## 2017-01-02 LAB — POCT I-STAT TROPONIN I: TROPONIN I, POC: 0.02 ng/mL (ref 0.00–0.08)

## 2017-01-02 LAB — D-DIMER, QUANTITATIVE (NOT AT ARMC): D DIMER QUANT: 0.89 ug{FEU}/mL — AB (ref 0.00–0.50)

## 2017-01-02 LAB — BRAIN NATRIURETIC PEPTIDE: B Natriuretic Peptide: 48.9 pg/mL (ref 0.0–100.0)

## 2017-01-02 MED ORDER — ALBUTEROL SULFATE (2.5 MG/3ML) 0.083% IN NEBU: 5.0000 mg | INHALATION_SOLUTION | Freq: Once | RESPIRATORY_TRACT | Status: DC

## 2017-01-02 MED ORDER — OXYCODONE-ACETAMINOPHEN 7.5-325 MG PO TABS
1.0000 | ORAL_TABLET | Freq: Once | ORAL | Status: AC
  Administered 2017-01-02: 1 via ORAL
  Filled 2017-01-02: qty 1

## 2017-01-02 MED ORDER — RIVAROXABAN (XARELTO) VTE STARTER PACK (15 & 20 MG): ORAL_TABLET | ORAL | 0 refills | Status: DC

## 2017-01-02 MED ORDER — IOPAMIDOL (ISOVUE-370) INJECTION 76%
INTRAVENOUS | Status: AC
  Filled 2017-01-02: qty 100

## 2017-01-02 MED ORDER — DOXYCYCLINE HYCLATE 100 MG PO CAPS: 100.0000 mg | ORAL_CAPSULE | Freq: Two times a day (BID) | ORAL | 0 refills | Status: AC

## 2017-01-02 NOTE — ED Notes (Signed)
Pt measures approximately 47in from side to side with shoulders from arm to arm measuring 42 in. PA made aware

## 2017-01-02 NOTE — ED Provider Notes (Signed)
[facility]ER[facility]Y DEPT Provider Note   CSN: [id number] Arrival date & time: 01/02/17  1347     History   Chief Complaint Chief Complaint  Patient presents with  . Shortness of Breath    HPI Anthony [name] is a 36 y.o. male.  HPI Patient, with past medical history of obesity, presents to ED for evaluation of 3 day history of shortness of breath, cough, leg swelling. He states that cough is productive. He has noted bilateral leg swelling with left leg worse than the right. Shortness of breath is worse with exertion. Also reports associated chills.  Denies any previous history of similar symptoms. Has no nebulizer or inhaler use at home. Reports quitting smoking 3 months ago.  He denies any hemoptysis, sick contacts, recent travel, recent surgery, prior DVT or PE.  Past Medical History:  Diagnosis Date  . Obese     There are no active problems to display for this patient.   Past Surgical History:  Procedure Laterality Date  . ARTHROSCOPY KNEE W/ DRILLING         Home Medications    Prior to Admission medications   Medication Sig Start Date End Date Taking? Authorizing Provider  naproxen (NAPROSYN) 500 MG tablet Take 1 tablet (500 mg total) by mouth 2 (two) times daily. 07/05/16  Yes Liberty Handy, PA-C  oxyCODONE-acetaminophen (PERCOCET) 10-325 MG per tablet Take 0.5-1 tablets by mouth 3 (three) times daily as needed for pain. pain   Yes [provider]  doxycycline (VIBRAMYCIN) 100 MG capsule Take 1 capsule (100 mg total) by mouth 2 (two) times daily. 01/02/17 01/09/17  Jaliana Medellin, PA-C  hydrocortisone 2.5 % lotion Apply topically 2 (two) times daily. Patient not taking: Reported on 01/02/2017 12/24/13   Piepenbrink, Victorino Dike, PA-C  methocarbamol (ROBAXIN) 500 MG tablet Take 1 tablet (500 mg total) by mouth 2 (two) times daily. Patient not taking: Reported on 01/02/2017 07/05/16   Liberty Handy, PA-C  Rivaroxaban 15 & 20 MG TBPK Take as directed on package:  Start with one 15mg  tablet by mouth twice a day with food. On Day 22, switch to one 20mg  tablet once a day with food. 01/02/17   Dietrich Pates, PA-C    Family History Family History  Problem Relation Age of Onset  . Hypertension Father   . Diabetes Father   . Stroke Father   . Cancer Paternal Uncle     Social History Social History  Substance Use Topics  . Smoking status: Former Smoker    Packs/day: 1.00    Years: 10.00    Types: Cigarettes    Quit date: 09/30/2016  . Smokeless tobacco: Never Used  . Alcohol use No     Allergies   Patient has no known allergies.   Review of Systems Review of Systems  Constitutional: Negative for appetite change, chills and fever.  HENT: Negative for ear pain, rhinorrhea, sneezing and sore throat.   Eyes: Negative for photophobia and visual disturbance.  Respiratory: Positive for cough and shortness of breath. Negative for chest tightness and wheezing.   Cardiovascular: Positive for leg swelling. Negative for chest pain and palpitations.  Gastrointestinal: Negative for abdominal pain, blood in stool, constipation, diarrhea, nausea and vomiting.  Genitourinary: Negative for dysuria, hematuria and urgency.  Musculoskeletal: Negative for myalgias.  Skin: Negative for rash.  Neurological: Negative for dizziness, weakness and light-headedness.     Physical Exam Updated Vital Signs BP (!) 143/79 (BP Location: Left Arm)   Pulse 74  Temp 99 F (37.2 C) (Oral)   Resp 18   Ht 5\' 8"  (1.727 m)   Wt (!) 251.3 kg (554 lb)   SpO2 92%   BMI 84.24 kg/m   Physical Exam  Constitutional: He appears well-developed and well-nourished. No distress.  Obese appearing male. Nontoxic appearing and in no acute distress. Pleasant.  HENT:  Head: Normocephalic and atraumatic.  Nose: Nose normal.  Eyes: Conjunctivae and EOM are normal. Left eye exhibits no discharge. No scleral icterus.  Neck: Normal range of motion. Neck supple.  Cardiovascular: Normal  rate, regular rhythm, normal heart sounds and intact distal pulses.  Exam reveals no gallop and no friction rub.   No murmur heard. Pulmonary/Chest: Effort normal. No respiratory distress. He has rhonchi in the right upper field.  Abdominal: Soft. Bowel sounds are normal. He exhibits no distension. There is no tenderness. There is no guarding.  Musculoskeletal: Normal range of motion. He exhibits edema.  Bilateral lower extremity edema right greater than left. There is associated discoloration on right shin. Sensation intact to light touch. No calf tenderness present bilaterally. 2+ DP pulses bilaterally.  Neurological: He is alert. He exhibits normal muscle tone. Coordination normal.  Skin: Skin is warm and dry. No rash noted.  Psychiatric: He has a normal mood and affect.  Nursing note and vitals reviewed.    ED Treatments / Results  Labs (all labs ordered are listed, but only abnormal results are displayed) Labs Reviewed  D-DIMER, QUANTITATIVE (NOT AT [facility]) - Abnormal; Notable for the following:       Result Value   D-Dimer, Quant 0.89 (*)    All other components within normal limits  BASIC METABOLIC PANEL  CBC WITH DIFFERENTIAL/PLATELET  BRAIN NATRIURETIC PEPTIDE  I-STAT TROPONIN, ED  POCT I-STAT TROPONIN I    EKG  EKG Interpretation  Date/Time:  Thursday January 02 2017 13:55:22 EDT Ventricular Rate:  84 PR Interval:    QRS Duration: 101 QT Interval:  377 QTC Calculation: 446 R Axis:   89 Text Interpretation:  Sinus rhythm RSR' in V1 or V2, right VCD or RVH Nonspecific T abnormalities, inferior leads Baseline wander in lead(s) II III aVF NO STEMI No old tracing to compare Confirmed by 1970 [phone]) on 01/02/2017 8:16:41 PM       Radiology Dg Chest 2 View  Result Date: 01/02/2017 CLINICAL DATA:  Morbid obesity, shortness of breath, 1 week of nonproductive cough, exertional dyspnea, current smoker. Obesity. EXAM: CHEST  2 VIEW COMPARISON:  None in PACs  FINDINGS: There patchy alveolar opacities in both lungs. There is no pneumothorax or pleural effusion. The heart is top-normal in size. The pulmonary vascularity is mildly engorged. The bony thorax exhibits no acute abnormality. IMPRESSION: Patchy infiltrates bilaterally worrisome for pneumonia or asymmetric pulmonary edema. There is likely underlying CHF as well. Correlation with the patient's clinical history and laboratory values will be needed in an effort to judge the likelihood of CHF versus pneumonia. Conceivably these entities could coexist. Electronically Signed   By: David  01/04/2017 M.D.   On: 01/02/2017 14:32    Procedures Procedures (including critical care time)  Medications Ordered in ED Medications  iopamidol (ISOVUE-370) 76 % injection (not administered)  oxyCODONE-acetaminophen (PERCOCET) 7.5-325 MG per tablet 1 tablet (1 tablet Oral Given 01/02/17 1831)     Initial Impression / Assessment and Plan / ED Course  I have reviewed the triage vital signs and the nursing notes.  Pertinent labs & imaging results that were available  during my care of the patient were reviewed by me and considered in my medical decision making (see chart for details).     Patient, with a past medical history of obesity, presents to ED for evaluation of shortness of breath, cough, lower extremity edema. Symptoms have been going on for the past 3 days. on physical exam there is bilateral lower extremity edema with right greater than left. There are coarse breath sounds on right side. He is satting at 95-98% on room air. He is afebrile. He is not tachycardic or tachypneic. He does not appear in respiratory distress and is tolerating secretions. Chest x-ray showed findings for possible pneumonia or CHF. BMP, CBC unremarkable. Troponin negative 1. EKG shows nonspecific T changes and inferior leads no previous tracings to compare. BNP normal. D-dimer is elevated to 0.89. CT tech stated that patient is too large  for CT machine. I contacted [facility] and it appears that patient's with is too large for their machine as well. Spoke to radiologist who stated that we could get a VQ scan that is most likely going to return as indeterminate. Spoke to Dr. Eudelia Bunch, and after speaking to other attending, will elect to start patient on Xarelto and doxycycline for pneumonia. Patient does have follow-up with PCP monthly. I encouraged him to follow up closely with PCP. Advised him to discontinue use of naproxen and other NSAIDs while being on Xarelto. Patient increase to plan. Strict return precautions given.  Final Clinical Impressions(s) / ED Diagnoses   Final diagnoses:  Community acquired pneumonia, unspecified laterality  Elevated d-dimer    New Prescriptions New Prescriptions   DOXYCYCLINE (VIBRAMYCIN) 100 MG CAPSULE    Take 1 capsule (100 mg total) by mouth 2 (two) times daily.   RIVAROXABAN 15 & 20 MG TBPK    Take as directed on package: Start with one 15mg  tablet by mouth twice a day with food. On Day 22, switch to one 20mg  tablet once a day with food.     , PA-C 01/02/17 2223    01/04/17, MD 01/03/17 [phone]

## 2017-01-02 NOTE — ED Triage Notes (Signed)
Patient presents with shortness of breath x3 days. Patient states his niece and nephew were recently ill with cold/flu like symptoms. Patient reports he "felt feverish" yesterday, but did not take his temperature. Patient endorses dry, nonproductive cough starting yesterday. Patient denies sore throat/runny nose/ nasal congestion/nausea/vomiting/diarrhea. Patient denies history of asthma. Patient endorses history of sleep apnea and states he uses a bipap at home when he sleeps.

## 2017-01-02 NOTE — ED Notes (Signed)
Pt stuck X1 with no success. Pt reports he has had to have ultrasound in the past

## 2017-01-02 NOTE — Discharge Instructions (Signed)
Please read attached information regarding your condition. Take Xarelto as directed. Take doxycycline twice daily for 1 week. Discontinue use of naproxen and other NSAIDs. Follow-up with PCP in one to 2 days for further management. Return to ED for worsening chest pain, trouble breathing, trouble swallowing, numbness, injuries.

## 2017-01-02 NOTE — Progress Notes (Signed)
**  Preliminary report by tech**  Right lower extremity venous duplex complete. There is no obvious evidence of deep or superficial vein thrombosis involving the right lower extremity. All clearly visualized vessels appear patent and compressible. There is no evidence of a Baker's cyst on the right. Results were given to Dr. Eudelia Bunch.  01/02/17 6:31 PM Olen Cordial RVT

## 2017-01-02 NOTE — ED Notes (Addendum)
Vascular Ultrasound at bedside.  

## 2017-01-03 ENCOUNTER — Telehealth: Payer: Self-pay | Admitting: Emergency Medicine

## 2017-01-03 MED FILL — XARELTO 15 MG TABLET: 15 | 21 days supply | Qty: 42 | Fill #0

## 2017-01-03 MED FILL — ?DOXYCYCLINE HYC 100MG TAB: 100 | 7 days supply | Qty: 14 | Fill #0

## 2017-01-03 NOTE — Telephone Encounter (Signed)
Pt called in stating the medications he was started on yesterday for pneumonia and blood thinners are too expensive. Pt does not have ins at this time and is not working, reports transitioning to disablity.  Pt did have a PCP but is unable to continue to see them at this time.  Discussed assistance by establishing PCP with one of our clinics. [facility] Appointment made for Thurs Aug 30 at 8:00 am.  Advised pt to go to the [facility] for pharmacy needs and to make an appointment with them for financial counseling.  No further CM needs noted at this time.

## 2017-01-06 ENCOUNTER — Telehealth: Payer: Self-pay

## 2017-01-06 NOTE — Telephone Encounter (Signed)
As per Georgiana Shore Ausdell, RPH/[facility], the patient picked up the xarelto and doxycycline on 01/03/17. Message sent with Derrek Monaco, RN CM with an update.

## 2017-01-09 ENCOUNTER — Ambulatory Visit (INDEPENDENT_AMBULATORY_CARE_PROVIDER_SITE_OTHER): Payer: Self-pay | Admitting: Family Medicine

## 2017-01-09 ENCOUNTER — Encounter: Payer: Self-pay | Admitting: Family Medicine

## 2017-01-09 VITALS — BP 134/84 | HR 90 | Temp 98.2°F | Resp 14 | Ht 68.0 in | Wt >= 6400 oz

## 2017-01-09 DIAGNOSIS — J189 Pneumonia, unspecified organism: Secondary | ICD-10-CM

## 2017-01-09 DIAGNOSIS — Z7901 Long term (current) use of anticoagulants: Secondary | ICD-10-CM

## 2017-01-09 DIAGNOSIS — F329 Major depressive disorder, single episode, unspecified: Secondary | ICD-10-CM

## 2017-01-09 LAB — POCT URINALYSIS DIP (DEVICE)
BILIRUBIN URINE: NEGATIVE
Glucose, UA: NEGATIVE mg/dL
Hgb urine dipstick: NEGATIVE
Ketones, ur: NEGATIVE mg/dL
Leukocytes, UA: NEGATIVE
NITRITE: NEGATIVE
PH: 5.5 (ref 5.0–8.0)
PROTEIN: NEGATIVE mg/dL
Specific Gravity, Urine: 1.02 (ref 1.005–1.030)
Urobilinogen, UA: 0.2 mg/dL (ref 0.0–1.0)

## 2017-01-09 MED ORDER — RIVAROXABAN (XARELTO) VTE STARTER PACK (15 & 20 MG): ORAL_TABLET | ORAL | 0 refills | Status: DC

## 2017-01-09 MED ORDER — CIPROFLOXACIN HCL 0.3 % OP SOLN: 1.0000 [drp] | OPHTHALMIC | 0 refills | Status: DC

## 2017-01-09 MED FILL — CIPROFLOXACIN 0.3% EYE DROP: 0.3 | 5 days supply | Qty: 5 | Fill #0

## 2017-01-09 NOTE — Progress Notes (Signed)
Patient ID: Anthony [name], male    DOB: 1981-01-29, 36 y.o.   MRN: [id number]  PCP: Bing Neighbors, FNP  Chief Complaint  Patient presents with  . Establish Care  . Hospitalization Follow-up    Subjective:  HPI Anthony [name] is a 36 y.o. male,with morbid obesity, chronic pain,  former smoker, and situational major depressive disorder, presents for hospital follow-up.  Hospital Follow-up Anthony [name] presented to [facility] emergency department on 01/02/2017 with a complaint of chest pain , shortness of breath , productive cough, and bilateral leg swelling. He was initially worked up and it was thought that his symptoms were related to heart failure and pneumonia according per CXR. He had an elevated D. Dimer. However due to patient's weight facility was unable to obtain a CTA as the machine will not accommodate a patient weighing over 350 pounds. Therefore patient was treated with doxycycline for pneumonia and started on Xarelto for presumptive pulmonary embolism. Today Anthony [name] reports only occasionally mild aching  at the lower chest. Continues to have a mild non-productive cough and with eye puffiness upon awakening which resolves throughout the day. He is currently taking his Xarelto starter pack as prescribed and has access to pick-up remaining refills at [facility] pharmacy.   Major Depression Anthony [name] reports chronic, on-going situational depression. He reports a prior injury which occurred at work a few years ago which resulted in lose of employment and insurance benefits. He had gotten another job,which he lost due to being able to perform up to standard due to [employer] limitations. He followed for chronic pain management by Dr. Billee Cashing. Bensen is in the process of applying for disability. He denies suicidal thoughts or thoughts of harming others. He doesn't feel it is necessary at present to start medication therapy or counseling services at present, however is willing to follow-up if symptoms  worsen or do not improve. Social History   Social History  . Marital status: Married    Spouse name: N/A  . Number of children: N/A  . Years of education: N/A   Occupational History  . Not on file.   Social History Main Topics  . Smoking status: Former Smoker    Packs/day: 1.00    Years: 10.00    Types: Cigarettes    Quit date: 09/30/2016  . Smokeless tobacco: Never Used  . Alcohol use No  . Drug use: No  . Sexual activity: Yes    Birth control/ protection: Condom   Other Topics Concern  . Not on file   Social History Narrative  . No narrative on file    Family History  Problem Relation Age of Onset  . Hypertension Father   . Diabetes Father   . Stroke Father   . Cancer Paternal Uncle    Review of Systems See history of present illness There are no active problems to display for this patient.   No Known Allergies  Prior to Admission medications   Medication Sig Start Date End Date Taking? Authorizing Provider  doxycycline (VIBRAMYCIN) 100 MG capsule Take 1 capsule (100 mg total) by mouth 2 (two) times daily. 01/02/17 01/09/17 Yes Khatri, Hina, PA-C  hydrocortisone 2.5 % lotion Apply topically 2 (two) times daily. 12/24/13  Yes Piepenbrink, Victorino Dike, PA-C  methocarbamol (ROBAXIN) 500 MG tablet Take 1 tablet (500 mg total) by mouth 2 (two) times daily. 07/05/16  Yes Liberty Handy, PA-C  oxyCODONE-acetaminophen (PERCOCET) 10-325 MG per tablet Take 0.5-1 tablets by mouth 3 (three) times daily as  needed for pain. pain   Yes [provider]  Rivaroxaban 15 & 20 MG TBPK Take as directed on package: Start with one 15mg  tablet by mouth twice a day with food. On Day 22, switch to one 20mg  tablet once a day with food. 01/02/17  Yes Khatri, Hina, PA-C  naproxen (NAPROSYN) 500 MG tablet Take 1 tablet (500 mg total) by mouth 2 (two) times daily. Patient not taking: Reported on 01/09/2017 07/05/16   01/11/2017    Past Medical, Surgical Family and Social  History reviewed and updated.    Objective:   Today's Vitals   01/09/17 1023  BP: 134/84  Pulse: 90  Resp: 14  Temp: 98.2 F (36.8 C)  TempSrc: Oral  SpO2: 97%  Weight: (!) 529 lb (240 kg)  Height: 5\' 8"  (1.727 m)    Wt Readings from Last 3 Encounters:  01/09/17 (!) 529 lb (240 kg)  01/02/17 (!) 554 lb (251.3 kg)  07/05/16 (!) 554 lb (251.3 kg)    Physical Exam  Constitutional: He is oriented to person, place, and time. He appears well-developed and well-nourished.  HENT:  Head: Normocephalic and atraumatic.  Nose: Nose normal.  Eyes: Pupils are equal, round, and reactive to light. Conjunctivae and EOM are normal. Scleral icterus is present.  Negative periorbital edema  Positive bilateral eye erythema   Neck: Normal range of motion. Neck supple.  Cardiovascular: Normal rate, regular rhythm, normal heart sounds and intact distal pulses.   Pulmonary/Chest: Effort normal and breath sounds normal. He has no wheezes. He has no rales.  Abdominal: Soft.  Musculoskeletal: Normal range of motion.  Lymphadenopathy:    He has no cervical adenopathy.  Neurological: He is alert and oriented to person, place, and time.  Skin: Skin is warm and dry.  Psychiatric: He has a normal mood and affect. His behavior is normal. Judgment and thought content normal.   Assessment & Plan:  1. Pneumonia due to infectious organism, unspecified laterality, unspecified part of lung -Continue Doxycycline, complete all medication  -Return in 6 weeks for repeat CXR  2. Anticoagulated by anticoagulation treatment -Xarelto x 6 months for presumptive PE, given weight and inability to confirm presence of PE /location, will opt to anticoagulate for 6 months.   -Check creatinine in 6 weeks.  3. Reactive depression -Pt decline counseling and SSRI. Affect was negative for signs of active depression. -Encouraged to follow-up if symptoms persist or worsen   RTC: 6 weeks creatinine, CXR, and symptoms  evaluation.   01/11/17. 01/04/17, MSN, FNP-C [facility]  [address] Tiburcio Pea [place], [place] Sherian Maroon [phone]

## 2017-01-16 ENCOUNTER — Ambulatory Visit: Payer: Commercial Managed Care - PPO | Attending: Family Medicine

## 2017-01-16 ENCOUNTER — Telehealth: Payer: Self-pay | Admitting: Family Medicine

## 2017-01-16 DIAGNOSIS — F329 Major depressive disorder, single episode, unspecified: Secondary | ICD-10-CM | POA: Insufficient documentation

## 2017-01-16 DIAGNOSIS — Z7901 Long term (current) use of anticoagulants: Secondary | ICD-10-CM | POA: Insufficient documentation

## 2017-01-16 DIAGNOSIS — J189 Pneumonia, unspecified organism: Secondary | ICD-10-CM | POA: Insufficient documentation

## 2017-01-16 NOTE — Telephone Encounter (Signed)
Wife called because patient was seen for pneumonia last week and he still having symptoms. He does have a non productive cough, is still out of breath and having trouble breathing. She was wondering if the antibiotic could be extended. Patient uses the [facility]. Please advise

## 2017-01-16 NOTE — Telephone Encounter (Signed)
Spoke with patients wife. she states he is still not feeling better, and now has developed a non productive cough. She states he is not having any chest pain or shortness of breath at this time. I advised that patient will need to come in for a follow up and an appointment is scheduled for tomorrow 01/17/2017 with Jerrilyn Cairo. Thanks!

## 2017-01-17 ENCOUNTER — Ambulatory Visit (INDEPENDENT_AMBULATORY_CARE_PROVIDER_SITE_OTHER): Payer: Self-pay | Admitting: Family Medicine

## 2017-01-17 ENCOUNTER — Ambulatory Visit (HOSPITAL_COMMUNITY)
Admission: RE | Admit: 2017-01-17 | Discharge: 2017-01-17 | Disposition: A | Discharge status: Home / Self Care | Payer: No Typology Code available for payment source | Source: Ambulatory Visit | Attending: Family Medicine | Admitting: Family Medicine

## 2017-01-17 ENCOUNTER — Encounter: Payer: Self-pay | Admitting: Family Medicine

## 2017-01-17 ENCOUNTER — Telehealth: Payer: Self-pay | Admitting: Family Medicine

## 2017-01-17 VITALS — BP 140/68 | HR 100 | Temp 98.0°F | Resp 14 | Ht 68.0 in | Wt >= 6400 oz

## 2017-01-17 DIAGNOSIS — J189 Pneumonia, unspecified organism: Secondary | ICD-10-CM

## 2017-01-17 DIAGNOSIS — I288 Other diseases of pulmonary vessels: Secondary | ICD-10-CM | POA: Diagnosis not present

## 2017-01-17 DIAGNOSIS — R0602 Shortness of breath: Secondary | ICD-10-CM

## 2017-01-17 LAB — CBC WITH DIFFERENTIAL/PLATELET
BASOS PCT: 0.8 %
Basophils Absolute: 57 cells/uL (ref 0–200)
Eosinophils Absolute: 249 cells/uL (ref 15–500)
Eosinophils Relative: 3.5 %
HCT: 46 % (ref 38.5–50.0)
Hemoglobin: 15.5 g/dL (ref 13.2–17.1)
Lymphs Abs: 1633 cells/uL (ref 850–3900)
MCH: 29.4 pg (ref 27.0–33.0)
MCHC: 33.7 g/dL (ref 32.0–36.0)
MCV: 87.3 fL (ref 80.0–100.0)
MONOS PCT: 11 %
MPV: 10.9 fL (ref 7.5–12.5)
Neutro Abs: 4381 cells/uL (ref 1500–7800)
Neutrophils Relative %: 61.7 %
PLATELETS: 298 10*3/uL (ref 140–400)
RBC: 5.27 10*6/uL (ref 4.20–5.80)
RDW: 13.4 % (ref 11.0–15.0)
TOTAL LYMPHOCYTE: 23 %
WBC: 7.1 10*3/uL (ref 3.8–10.8)
WBCMIX: 781 {cells}/uL (ref 200–950)

## 2017-01-17 MED ORDER — IPRATROPIUM BROMIDE 0.02 % IN SOLN
0.5000 mg | Freq: Once | RESPIRATORY_TRACT | Status: AC
  Administered 2017-01-17: 0.5 mg via RESPIRATORY_TRACT

## 2017-01-17 MED ORDER — AZITHROMYCIN 250 MG PO TABS: ORAL_TABLET | ORAL | 0 refills | Status: DC

## 2017-01-17 MED ORDER — ALBUTEROL SULFATE HFA 108 (90 BASE) MCG/ACT IN AERS: 2.0000 | INHALATION_SPRAY | RESPIRATORY_TRACT | 1 refills | Status: DC | PRN

## 2017-01-17 MED ORDER — ALBUTEROL SULFATE (2.5 MG/3ML) 0.083% IN NEBU
2.5000 mg | INHALATION_SOLUTION | Freq: Once | RESPIRATORY_TRACT | Status: AC
  Administered 2017-01-17: 2.5 mg via RESPIRATORY_TRACT

## 2017-01-17 MED ORDER — AMOXICILLIN-POT CLAVULANATE 875-125 MG PO TABS: 1.0000 | ORAL_TABLET | Freq: Two times a day (BID) | ORAL | 0 refills | Status: DC

## 2017-01-17 MED FILL — !VENTOLIN HFA INHALER: 108 (90 BAS | 16 days supply | Qty: 18 | Fill #0

## 2017-01-17 MED FILL — AMOX-CLAV 875-125 MG TABLET: 875-125 | 10 days supply | Qty: 20 | Fill #0

## 2017-01-17 MED FILL — AZITHROMYCIN 250 MG TABLET: 250 | 5 days supply | Qty: 6 | Fill #0

## 2017-01-17 NOTE — Telephone Encounter (Signed)
Patient notified of results and will continue medication 

## 2017-01-17 NOTE — Progress Notes (Signed)
Patient ID: Anthony [name], male    DOB: 1981-04-05, 36 y.o.   MRN: [id number]  PCP: Bing Neighbors, FNP  Chief Complaint  Patient presents with  . Shortness of Breath  . Wheezing  . Medication Problem    xarelto not working    Subjective:  HPI Anthony [name] is a 36 y.o. male, recently diagnosed with pneumonia presents today complaining of SOB and wheezing after completing antibiotic treatment. Anthony [name] was diagnosed with CAP 01/02/2017 at [facility] ED. During that visit he was found to have an elevated D dimer, however to weigh capacity limitations of the CT machine, PE could not be confirmed by imaging. He was prescribed a 10 day course of Doxycyline 100 mg and started on Xarelto for treatment of presumptive PE. Today he reports over the last few days experiencing cold sweats, subjective fever, increased fatigue, shortness of breath, decreased appetite, wheezing and worsening cough which is occasionally productive of brownish and green sputum. He feels that the xarelto isn't working to improve his symptoms. He has not attempted to relieve cough with any OTC medications. He denies lower or upper extremity swelling or chest pain.  Social History   Social History  . Marital status: Married    Spouse name: N/A  . Number of children: N/A  . Years of education: N/A   Occupational History  . Not on file.   Social History Main Topics  . Smoking status: Former Smoker    Packs/day: 1.00    Years: 10.00    Types: Cigarettes    Quit date: 09/30/2016  . Smokeless tobacco: Never Used  . Alcohol use No  . Drug use: No  . Sexual activity: Yes    Birth control/ protection: Condom   Other Topics Concern  . Not on file   Social History Narrative  . No narrative on file    Family History  Problem Relation Age of Onset  . Hypertension Father   . Diabetes Father   . Stroke Father   . Cancer Paternal Uncle    Review of Systems  See HPI   Patient Active Problem List   Diagnosis  Date Noted  . Pneumonia due to infectious organism 01/16/2017  . Reactive depression 01/16/2017  . Anticoagulated by anticoagulation treatment 01/16/2017    No Known Allergies  Prior to Admission medications   Medication Sig Start Date End Date Taking? Authorizing Provider  ciprofloxacin (CILOXAN) 0.3 % ophthalmic solution Place 1 drop into both eyes every 4 (four) hours while awake. 01/09/17  Yes Bing Neighbors, FNP  oxyCODONE-acetaminophen (PERCOCET) 10-325 MG per tablet Take 0.5-1 tablets by mouth 3 (three) times daily as needed for pain. pain   Yes [provider]  Rivaroxaban 15 & 20 MG TBPK Take as directed on package 01/09/17  Yes Bing Neighbors, FNP  hydrocortisone 2.5 % lotion Apply topically 2 (two) times daily. Patient not taking: Reported on 01/17/2017 12/24/13   Piepenbrink, Victorino Dike, PA-C  methocarbamol (ROBAXIN) 500 MG tablet Take 1 tablet (500 mg total) by mouth 2 (two) times daily. Patient not taking: Reported on 01/17/2017 07/05/16   Jerrell Mylar    Past Medical, Surgical Family and Social History reviewed and updated.    Objective:   Today's Vitals   01/17/17 0916  BP: 140/68  Pulse: 100  Resp: 14  Temp: 98 F (36.7 C)  TempSrc: Oral  SpO2: 97%  Weight: (!) 530 lb 9.6 oz (240.7 kg)  Height: 5\' 8"  (1.727  m)    Wt Readings from Last 3 Encounters:  01/17/17 (!) 530 lb 9.6 oz (240.7 kg)  01/09/17 (!) 529 lb (240 kg)  01/02/17 (!) 554 lb (251.3 kg)    Physical Exam  Constitutional: He is oriented to person, place, and time. He appears ill.  HENT:  Head: Normocephalic and atraumatic.  Nose: Nose normal.  Mouth/Throat: Oropharynx is clear and moist. No oropharyngeal exudate.  Eyes:  Bilateral scleral erythema. Negative of orbital edema   Neck: Normal range of motion. Neck supple.  Cardiovascular: Normal rate, regular rhythm, normal heart sounds and intact distal pulses.   Pulmonary/Chest: Effort normal. He has decreased breath  sounds in the right upper field, the right middle field, the right lower field and the left upper field. He has no wheezes.  Negative for wheezing. Positive for dyspnea.  Musculoskeletal: Normal range of motion.  Lymphadenopathy:    He has no cervical adenopathy.  Neurological: He is alert and oriented to person, place, and time.  Skin: Skin is warm. He is diaphoretic.  Psychiatric: He has a normal mood and affect. His behavior is normal. Thought content normal.   Dg Chest 2 View  Result Date: 01/17/2017 CLINICAL DATA:  Pneumonia. Completed antibiotics continued fever. Morbid obesity. EXAM: CHEST  2 VIEW COMPARISON:  01/02/2017 FINDINGS: Patchy airspace disease in the right middle lobe unchanged. Nodular density left upper lobe measuring 15 mm also unchanged. Mild patchy left lower lobe airspace disease Heart size within normal limits. Pulmonary artery enlargement may represent vascular congestion or pulmonary artery hypertension. No effusion. IMPRESSION: No significant change from the prior study. While this may represent slowly resolving pneumonia, consider follow-up chest CT to exclude underlying neoplastic process or atypical infection Pulmonary artery enlargement suggesting pulmonary artery hypertension. Underlying mild heart failure not excluded. Electronically Signed   By: Marlan Palau M.D.   On: 01/17/2017 11:46   Dg Chest 2 View  Result Date: 01/02/2017 CLINICAL DATA:  Morbid obesity, shortness of breath, 1 week of nonproductive cough, exertional dyspnea, current smoker. Obesity. EXAM: CHEST  2 VIEW COMPARISON:  None in PACs FINDINGS: There patchy alveolar opacities in both lungs. There is no pneumothorax or pleural effusion. The heart is top-normal in size. The pulmonary vascularity is mildly engorged. The bony thorax exhibits no acute abnormality. IMPRESSION: Patchy infiltrates bilaterally worrisome for pneumonia or asymmetric pulmonary edema. There is likely underlying CHF as well.  Correlation with the patient's clinical history and laboratory values will be needed in an effort to judge the likelihood of CHF versus pneumonia. Conceivably these entities could coexist. Electronically Signed   By: David  [place] M.D.   On: 01/02/2017 14:32       Assessment & Plan:  1. Pneumonia due to infectious organism, unspecified laterality, unspecified part of lung 2. Shortness of breath -Treating empirically with Azithromycin and Augmentin for suspected worsening pneumonia.  -Chest-xray continues to note a questionable underlying CHF, pulmonary hypertension, and the presence of a 15 mm lung nodule. Ordering a CT Chest within 10 days as patient will be nearing the completion of prescribed antibiotic therapy. Educated about the indications and expected outcomes for treatment with a blood thinner, Xarelto is indicated as you are being treated for presumptive pulmonary embolism. For acute shortness of breath, Duo nebulizer treatment administered once in office.  Meds ordered this encounter  Medications  . azithromycin (ZITHROMAX) 250 MG tablet    Sig: Take 2 tabs PO x 1 dose, then 1 tab PO QD x 4 days  Dispense:  6 tablet    Refill:  0    Order Specific Question:   Supervising Provider    Answer:   Quentin Angst [id number]  . amoxicillin-clavulanate (AUGMENTIN) 875-125 MG tablet    Sig: Take 1 tablet by mouth 2 (two) times daily.    Dispense:  20 tablet    Refill:  0    Order Specific Question:   Supervising Provider    Answer:   Quentin Angst [id number]  . albuterol (PROVENTIL HFA;VENTOLIN HFA) 108 (90 Base) MCG/ACT inhaler    Sig: Inhale 2 puffs into the lungs every 4 (four) hours as needed for wheezing or shortness of breath (cough, shortness of breath or wheezing.).    Dispense:  1 Inhaler    Refill:  1    Order Specific Question:   Supervising Provider    Answer:   Quentin Angst [id number]  . albuterol (PROVENTIL) (2.5 MG/3ML) 0.083% nebulizer solution 2.5  mg  . ipratropium (ATROVENT) nebulizer solution 0.5 mg   RTC: 4 days to re-evaluate symptoms.   [name], MSN, FNP-C [facility]  [address] Sherian Maroon [place], [place] [address] [phone]

## 2017-01-17 NOTE — Patient Instructions (Addendum)
-Go downstairs to obtain a chest X-ray, I will follow-up with you via phone regarding labs and chest x-ray results. -Start Azithromycin Take 2 tabs x 1 dose, then 1 tab every day for x 4 days. -Augmentin 875-125 mg twice daily with food x 10 days. -I have prescribed albuterol inhaler, use 4 hours as needed for shortness of breath  -Continue Xarelto as prescribed.   Community-Acquired Pneumonia, Adult Pneumonia is an infection of the lungs. There are different types of pneumonia. One type can develop while a person is in a hospital. A different type, called community-acquired pneumonia, develops in people who are not, or have not recently been, in the hospital or other health care facility. What are the causes? Pneumonia may be caused by bacteria, viruses, or funguses. Community-acquired pneumonia is often caused by Streptococcus pneumonia bacteria. These bacteria are often passed from one person to another by breathing in droplets from the cough or sneeze of an infected person. What increases the risk? The condition is more likely to develop in:  People who havechronic diseases, such as chronic obstructive pulmonary disease (COPD), asthma, congestive heart failure, cystic fibrosis, diabetes, or kidney disease.  People who haveearly-stage or late-stage HIV.  People who havesickle cell disease.  People who havehad their spleen removed (splenectomy).  People who havepoor Administrator.  People who havemedical conditions that increase the risk of breathing in (aspirating) secretions their own mouth and nose.  People who havea weakened immune system (immunocompromised).  People who smoke.  People whotravel to areas where pneumonia-causing germs commonly exist.  People whoare around animal habitats or animals that have pneumonia-causing germs, including birds, bats, rabbits, cats, and farm animals.  What are the signs or symptoms? Symptoms of this condition include:  Adry  cough.  A wet (productive) cough.  Fever.  Sweating.  Chest pain, especially when breathing deeply or coughing.  Rapid breathing or difficulty breathing.  Shortness of breath.  Shaking chills.  Fatigue.  Muscle aches.  How is this diagnosed? Your health care provider will take a medical history and perform a physical exam. You may also have other tests, including:  Imaging studies of your chest, including X-rays.  Tests to check your blood oxygen level and other blood gases.  Other tests on blood, mucus (sputum), fluid around your lungs (pleural fluid), and urine.  If your pneumonia is severe, other tests may be done to identify the specific cause of your illness. How is this treated? The type of treatment that you receive depends on many factors, such as the cause of your pneumonia, the medicines you take, and other medical conditions that you have. For most adults, treatment and recovery from pneumonia may occur at home. In some cases, treatment must happen in a hospital. Treatment may include:  Antibiotic medicines, if the pneumonia was caused by bacteria.  Antiviral medicines, if the pneumonia was caused by a virus.  Medicines that are given by mouth or through an IV tube.  Oxygen.  Respiratory therapy.  Although rare, treating severe pneumonia may include:  Mechanical ventilation. This is done if you are not breathing well on your own and you cannot maintain a safe blood oxygen level.  Thoracentesis. This procedureremoves fluid around one lung or both lungs to help you breathe better.  Follow these instructions at home:  Take over-the-counter and prescription medicines only as told by your health care provider. ? Only takecough medicine if you are losing sleep. Understand that cough medicine can prevent your body's natural  ability to remove mucus from your lungs. ? If you were prescribed an antibiotic medicine, take it as told by your health care provider.  Do not stop taking the antibiotic even if you start to feel better.  Sleep in a semi-upright position at night. Try sleeping in a reclining chair, or place a few pillows under your head.  Do not use tobacco products, including cigarettes, chewing tobacco, and e-cigarettes. If you need help quitting, ask your health care provider.  Drink enough water to keep your urine clear or pale yellow. This will help to thin out mucus secretions in your lungs. How is this prevented? There are ways that you can decrease your risk of developing community-acquired pneumonia. Consider getting a pneumococcal vaccine if:  You are older than 35 years of age.  You are older than 35 years of age and are undergoing cancer treatment, have chronic lung disease, or have other medical conditions that affect your immune system. Ask your health care provider if this applies to you.  There are different types and schedules of pneumococcal vaccines. Ask your health care provider which vaccination option is best for you. You may also prevent community-acquired pneumonia if you take these actions:  Get an influenza vaccine every year. Ask your health care provider which type of influenza vaccine is best for you.  Go to the dentist on a regular basis.  Wash your hands often. Use hand sanitizer if soap and water are not available.  Contact a health care provider if:  You have a fever.  You are losing sleep because you cannot control your cough with cough medicine. Get help right away if:  You have worsening shortness of breath.  You have increased chest pain.  Your sickness becomes worse, especially if you are an older adult or have a weakened immune system.  You cough up blood. This information is not intended to replace advice given to you by your health care provider. Make sure you discuss any questions you have with your health care provider. Document Released: 04/29/2005 Document Revised: 09/07/2015 Document  Reviewed: 08/24/2014 Elsevier Interactive Patient Education  2017 [employer].

## 2017-01-17 NOTE — Telephone Encounter (Signed)
Please advise patient that his chest x-ray shows likely  worsening pneumonia and therefore he should continue with prescribed antibiotics. I would also like to schedule a follow chest CT scan which I will have scheduled within the next 10 days closer to the end of course of antibiotics.  CBC was normal, however I would like to repeat labs in 10 days and CT scan in 10 days.  [name], MSN, FNP-C [facility]  [address] [place] Maroon [place], [place] [address] [phone]

## 2017-01-19 ENCOUNTER — Telehealth: Payer: Self-pay | Admitting: Family Medicine

## 2017-01-19 NOTE — Telephone Encounter (Signed)
Please schedule CT . I will be out of the office next week, preferably if it could be schedule no later than 9/17, that would be great at [facility].

## 2017-01-20 MED FILL — XARELTO 20 MG TABLET: 20 | 9 days supply | Qty: 9 | Fill #0

## 2017-01-21 ENCOUNTER — Ambulatory Visit: Payer: Self-pay | Admitting: Family Medicine

## 2017-01-24 ENCOUNTER — Ambulatory Visit: Payer: Self-pay | Admitting: Family Medicine

## 2017-01-27 ENCOUNTER — Ambulatory Visit: Payer: Self-pay | Admitting: Family Medicine

## 2017-01-28 ENCOUNTER — Ambulatory Visit (HOSPITAL_COMMUNITY): Payer: Self-pay

## 2017-01-31 ENCOUNTER — Ambulatory Visit: Payer: Self-pay

## 2017-02-03 ENCOUNTER — Ambulatory Visit (INDEPENDENT_AMBULATORY_CARE_PROVIDER_SITE_OTHER): Payer: Self-pay | Admitting: Family Medicine

## 2017-02-03 ENCOUNTER — Encounter: Payer: Self-pay | Admitting: Family Medicine

## 2017-02-03 VITALS — BP 136/74 | HR 84 | Temp 98.0°F | Resp 14 | Ht 68.0 in | Wt >= 6400 oz

## 2017-02-03 DIAGNOSIS — R350 Frequency of micturition: Secondary | ICD-10-CM

## 2017-02-03 DIAGNOSIS — M25561 Pain in right knee: Secondary | ICD-10-CM

## 2017-02-03 DIAGNOSIS — G8929 Other chronic pain: Secondary | ICD-10-CM

## 2017-02-03 DIAGNOSIS — R0602 Shortness of breath: Secondary | ICD-10-CM

## 2017-02-03 DIAGNOSIS — R918 Other nonspecific abnormal finding of lung field: Secondary | ICD-10-CM

## 2017-02-03 DIAGNOSIS — M25562 Pain in left knee: Secondary | ICD-10-CM

## 2017-02-03 LAB — POCT GLYCOSYLATED HEMOGLOBIN (HGB A1C): HEMOGLOBIN A1C: 5.8

## 2017-02-03 NOTE — Progress Notes (Signed)
Patient ID: Anthony [name], male    DOB: 1980/12/26, 36 y.o.   MRN: [id number]  PCP: Bing Neighbors, FNP  Chief Complaint  Patient presents with  . Follow-up    SOB  . Sciatica    Subjective:  HPI Anthony [name] is a 36 y.o. male presents for evaluation of shortness of breath. Siaosi reports no improvement of dyspnea since completing last course of antibiotics for symptoms and CXR concerning for worsening pneumonia. Anthony [name] was recently diagnosed with pneumonia 01/02/2017 at [facility] ED. During that visit he was found to have an elevated D dimer, however to weigh capacity limitations of the CT machine, PE could not be confirmed by imaging. He was prescribed a 10 day course of Doxycyline 100 mg and started on Xarelto for treatment of presumptive PE.  He was seen in office again 01/17/2017 and reported worsening of shortness of breath, fever, wheezing, and pronounced fatigue. He was treated with a 10 day course Augmentin and Azithromycin. He notes today no improvement of symptoms. Anthony [name] has stopped taking his Xarelto as he feels this medication is contributing to his symptoms, despite previously being counseled that Xarelto is anticoagulation therapy and treatment for PE. Reports compliance with BIPAP. No improvement of dyspnea with albuterol inhaler. Reports continued chest heaviness with deep breathing. Most recent CXR was significant for lung nodules and patient has been scheduled for a CT chest to further evaluation the etiology of nodules.  Social History   Social History  . Marital status: Married    Spouse name: N/A  . Number of children: N/A  . Years of education: N/A   Occupational History  . Not on file.   Social History Main Topics  . Smoking status: Former Smoker    Packs/day: 1.00    Years: 10.00    Types: Cigarettes    Quit date: 09/30/2016  . Smokeless tobacco: Never Used  . Alcohol use No  . Drug use: No  . Sexual activity: Yes    Birth control/ protection: Condom    Other Topics Concern  . Not on file   Social History Narrative  . No narrative on file    Family History  Problem Relation Age of Onset  . Hypertension Father   . Diabetes Father   . Stroke Father   . Cancer Paternal Uncle    Review of Systems  Constitutional: Positive for fatigue. Negative for fever.  Respiratory: Positive for cough, chest tightness and shortness of breath.   Cardiovascular: Negative.   Psychiatric/Behavioral: Positive for sleep disturbance.    Patient Active Problem List   Diagnosis Date Noted  . Pneumonia due to infectious organism 01/16/2017  . Reactive depression 01/16/2017  . Anticoagulated by anticoagulation treatment 01/16/2017    No Known Allergies  Prior to Admission medications   Medication Sig Start Date End Date Taking? Authorizing Provider  albuterol (PROVENTIL HFA;VENTOLIN HFA) 108 (90 Base) MCG/ACT inhaler Inhale 2 puffs into the lungs every 4 (four) hours as needed for wheezing or shortness of breath (cough, shortness of breath or wheezing.). 01/17/17  Yes Bing Neighbors, FNP  amoxicillin-clavulanate (AUGMENTIN) 875-125 MG tablet Take 1 tablet by mouth 2 (two) times daily. Patient not taking: Reported on 02/03/2017 01/17/17   Bing Neighbors, FNP  azithromycin (ZITHROMAX) 250 MG tablet Take 2 tabs PO x 1 dose, then 1 tab PO QD x 4 days Patient not taking: Reported on 02/03/2017 01/17/17   Bing Neighbors, FNP  ciprofloxacin (CILOXAN) 0.3 % ophthalmic  solution Place 1 drop into both eyes every 4 (four) hours while awake. Patient not taking: Reported on 02/03/2017 01/09/17   Bing Neighbors, FNP  hydrocortisone 2.5 % lotion Apply topically 2 (two) times daily. Patient not taking: Reported on 01/17/2017 12/24/13   Piepenbrink, Victorino Dike, PA-C  methocarbamol (ROBAXIN) 500 MG tablet Take 1 tablet (500 mg total) by mouth 2 (two) times daily. Patient not taking: Reported on 01/17/2017 07/05/16   Liberty Handy, PA-C  oxyCODONE-acetaminophen  (PERCOCET) 10-325 MG per tablet Take 0.5-1 tablets by mouth 3 (three) times daily as needed for pain. pain    [provider]  Rivaroxaban 15 & 20 MG TBPK Take as directed on package Patient not taking: Reported on 02/03/2017 01/09/17   Bing Neighbors, FNP    Past Medical, Surgical Family and Social History reviewed and updated.    Objective:   Today's Vitals   02/03/17 0908  BP: 136/74  Pulse: 84  Resp: 14  Temp: 98 F (36.7 C)  TempSrc: Oral  SpO2: 97%  Weight: (!) 513 lb 3.2 oz (232.8 kg)  Height: 5\' 8"  (1.727 m)    Wt Readings from Last 3 Encounters:  02/03/17 (!) 513 lb 3.2 oz (232.8 kg)  01/17/17 (!) 530 lb 9.6 oz (240.7 kg)  01/09/17 (!) 529 lb (240 kg)   Physical Exam  Constitutional: He is oriented to person, place, and time. He appears well-developed and well-nourished.  Non-toxic appearance. He does not have a sickly appearance. He does not appear ill.  HENT:  Head: Normocephalic.  Cardiovascular: Normal rate, regular rhythm, normal heart sounds and intact distal pulses.   Pulmonary/Chest: Effort normal. No accessory muscle usage. No respiratory distress. He has decreased breath sounds in the right upper field, the right middle field, the right lower field, the left upper field, the left middle field and the left lower field. He has no wheezes. He has no rhonchi. He has no rales.  Musculoskeletal: Normal range of motion.  Lymphadenopathy:    He has no cervical adenopathy.  Neurological: He is alert and oriented to person, place, and time.  Skin: Skin is warm and dry.  Psychiatric: He has a normal mood and affect. His behavior is normal. Judgment and thought content normal.   Assessment & Plan:  1. Shortness of breath 2. Lung nodules 3. Urinary frequency 4. Chronic pain of both knees  [name] continues to experience dyspnea in spite of conservative measures with short-acting inhaler and extended course of antibiotics.Echo is pending to rule of  cardiac etiology. ? Pulmonary HTN? Concern abnormal chest x-ray indicating lung nodules. CT chest pending to further evaluate nodules and rule out malignancy as a possible cause of persistent dyspnea given recent diagnosis of unprovoked PE. Mr. Wessler counseled again regarding resuming Xarelto and risk associated with abruptly discontinuing medication. Mr. Alejandro is prediabetic which may contribute to recent urinary frequency symptoms. Educated regarding limiting fluid intake close to bedtime. Patient is advised to notify me once approved for [facility] Financial assistance in order to be referred to pain management and orthopedic referral for chronic knee pain.   Orders Placed This Encounter  Procedures  . Brain natriuretic peptide  . EXTRA LAV TOP TUBE  . POCT glycosylated hemoglobin (Hb A1C)  . ECHOCARDIOGRAM COMPLETE     RTC: 6 weeks. Will follow-up with you via phone regarding pending diagnostic test.  [facility]. Godfrey Pick, MSN, FNP-C [facility]  [address] [place] [place],  [place] [address] [phone]

## 2017-02-03 NOTE — Patient Instructions (Signed)
Resume Xarelto to reduce the risk of a thrombolytic event.  Obtain Chest CT and I will have you schedule for an echocardiogram.  Contact office to advise once [facility] Patient Assistance Application has been approved and then the remainder of your referrals to pulmonology, orthopedics, and pain management  be completed      Shortness of Breath, Adult Shortness of breath means you have trouble breathing. Your lungs are organs for breathing. Follow these instructions at home: Pay attention to any changes in your symptoms. Take these actions to help with your condition:  Do not smoke. Smoking can cause shortness of breath. If you need help to quit smoking, ask your doctor.  Avoid things that can make it harder to breathe, such as: ? Mold. ? Dust. ? Air pollution. ? Chemical smells. ? Things that can cause allergy symptoms (allergens), if you have allergies.  Keep your living space clean and free of mold and dust.  Rest as needed. Slowly return to your usual activities.  Take over-the-counter and prescription medicines, including oxygen and inhaled medicines, only as told by your doctor.  Keep all follow-up visits as told by your doctor. This is important.  Contact a doctor if:  Your condition does not get better as soon as expected.  You have a hard time doing your normal activities, even after you rest.  You have new symptoms. Get help right away if:  You have trouble breathing when you are resting.  You feel light-headed or you faint.  You have a cough that is not helped by medicines.  You cough up blood.  You have pain with breathing.  You have pain in your chest, arms, shoulders, or belly (abdomen).  You have a fever.  You cannot walk up stairs.  You cannot exercise the way you normally do. This information is not intended to replace advice given to you by your health care provider. Make sure you discuss any questions you have with your health care  provider. Document Released: 10/16/2007 Document Revised: 05/16/2016 Document Reviewed: 05/16/2016 Elsevier Interactive Patient Education  2017 [employer].

## 2017-02-04 LAB — BRAIN NATRIURETIC PEPTIDE: Brain Natriuretic Peptide: 5 pg/mL (ref <100)

## 2017-02-04 LAB — EXTRA LAV TOP TUBE

## 2017-02-05 ENCOUNTER — Ambulatory Visit (HOSPITAL_COMMUNITY)
Admission: RE | Admit: 2017-02-05 | Discharge: 2017-02-05 | Disposition: A | Discharge status: Home / Self Care | Payer: Self-pay | Source: Ambulatory Visit | Attending: Family Medicine | Admitting: Family Medicine

## 2017-02-05 ENCOUNTER — Other Ambulatory Visit: Payer: Self-pay | Admitting: Family Medicine

## 2017-02-05 DIAGNOSIS — R0602 Shortness of breath: Secondary | ICD-10-CM

## 2017-02-05 DIAGNOSIS — R918 Other nonspecific abnormal finding of lung field: Secondary | ICD-10-CM | POA: Insufficient documentation

## 2017-02-05 DIAGNOSIS — R590 Localized enlarged lymph nodes: Secondary | ICD-10-CM | POA: Insufficient documentation

## 2017-02-06 ENCOUNTER — Other Ambulatory Visit: Payer: Self-pay | Admitting: Family Medicine

## 2017-02-06 DIAGNOSIS — R590 Localized enlarged lymph nodes: Secondary | ICD-10-CM

## 2017-02-06 DIAGNOSIS — R918 Other nonspecific abnormal finding of lung field: Secondary | ICD-10-CM

## 2017-02-06 NOTE — Progress Notes (Signed)
Anthony [name] , 36 year old recently established with here at [facility] for primary care services following an/ emergency department visit (01/02/2017)  in which he was diagnosed with a PE after presenting with chest pain and shortness of breath. He was placed on Xarelto for anticoagulation for treatment for a presumptive PE. Due to his morbid obesity, Anthony [name] was unable to perform CT angiogram to confirm PE however he had an elevated d dimer. He was diagnosed and treated for pneumonia only during the ED encounter. Anthony [name] persisted here in office again 01/17/2017 with symptoms of worsening shortness of breath, cough, chills, and subjective fever. Repeat CXR was concerning for pneumonia and revealed the presence of lung nodules. Antibiotic treatment was initiated again and CT Chest ordered and confirmed suspicious adenopathy and notable opacities left and right lobes of the lungs at multiple sites.   Attempted to call patient to make him aware of results, left voice mail to call and speak directly with me about results.  Referring patient to thoracic oncology for further work-up and evaluation.   For shortness of breath, he had an upcoming Echocardiogram pending 02/03/2017, however patient cancelled appointment.   Ct Chest Wo Contrast  Result Date: 02/06/2017 CLINICAL DATA:  Shortness of Breath EXAM: CT CHEST WITHOUT CONTRAST TECHNIQUE: Multidetector CT imaging of the chest was performed following the standard protocol without IV contrast. COMPARISON:  Chest radiograph January 17, 2017 FINDINGS: Cardiovascular: There is no appreciable thoracic aortic aneurysm. Visualized great vessels appear unremarkable on this noncontrast enhanced study. Pericardium is not appreciably thickened. Mediastinum/Nodes: Visualized thyroid appears unremarkable. No esophageal lesions are evident. There is extensive adenopathy throughout the thoracic region. There is a lymph node in the aortopulmonary window region measuring 4.0 x  2.8 cm. There are several right paratracheal and pretracheal lymph nodes, largest measuring 2.8 x 2.8 cm. There are multiple enlarged hilar lymph nodes, largest measuring 3.1 x 2.9 cm. There is left hilar adenopathy with the largest lymph node appreciable in the left hilum measuring 3.4 x 2.9 cm. There is sub- carinal adenopathy with the largest sub- carinal lymph node appreciable measuring 2.9 x 2.7 cm. Lungs/Pleura: There is a masslike area in the anterior segment of the left upper lobe near the apex measuring 2.6 x 2.0 x 2.1 cm. This area has irregular margins. There is a somewhat spiculated nodular opacity abutting the major fissure in the posterior segment of the left upper lobe measuring 1.9 x 1.5 x 1.5 cm. A nearby nodular opacity in this same area measures 1.3 x 1.0 x 1.0 cm. There are confluent opacities in the right perihilar region with involvement in portions of the right middle and lower lobes at several sites. These areas appear more infiltrative and nodular all the right, although there is a somewhat irregular opacity in the medial segment of the right lower lobe measuring 3.2 x 3.0 x 2.2 cm. There is a nodular opacity in the inferior lingula measuring 1.5 x 1.0 x 1.0 cm. There is a 5 mm nodular opacity in the anterior segment right upper lobe seen on axial slice 30 series 8. There is no pleural effusion or pleural thickening evident. Upper Abdomen: Liver appears prominent but incompletely visualized. Visualized spleen appears normal. Visualized upper abdominal structures otherwise appear unremarkable. Musculoskeletal: There is degenerative change in the thoracic spine. There are no blastic or lytic bone lesions. IMPRESSION: 1. Multiple irregular nodular appearing opacities on the left, concerning for multifocal neoplasia. 2. Areas of confluent opacity at multiple sites on the  right, most notably in the right middle and lower lobes. These areas appear more infiltrative than neoplastic, although  neoplasm with superimposed pneumonitis is certainly possible in one or more of these areas. 3. Extensive multifocal adenopathy throughout the hila and mediastinal regions. This appearance heightened suspicion for underlying neoplasm. 4. Given the changes in the lungs, correlation with bronchoscopy may well be warranted. Nuclear medicine PET study also could be helpful to assess for degree of metabolic activity in areas of abnormal lung parenchyma and enlarged lymph nodes. These results will be called to the ordering clinician or representative by the Radiologist Assistant, and communication documented in the PACS or zVision Dashboard. Electronically Signed   By: Bretta Bang III M.D.   On: 02/06/2017 08:50      Dg Chest 2 View  Result Date: 01/17/2017 CLINICAL DATA:  Pneumonia. Completed antibiotics continued fever. Morbid obesity. EXAM: CHEST  2 VIEW COMPARISON:  01/02/2017 FINDINGS: Patchy airspace disease in the right middle lobe unchanged. Nodular density left upper lobe measuring 15 mm also unchanged. Mild patchy left lower lobe airspace disease Heart size within normal limits. Pulmonary artery enlargement may represent vascular congestion or pulmonary artery hypertension. No effusion. IMPRESSION: No significant change from the prior study. While this may represent slowly resolving pneumonia, consider follow-up chest CT to exclude underlying neoplastic process or atypical infection Pulmonary artery enlargement suggesting pulmonary artery hypertension. Underlying mild heart failure not excluded. Electronically Signed   By: Marlan Palau M.D.   On: 01/17/2017 11:46    Anthony [name]. Tiburcio Pea, MSN, FNP-C [facility]  [address] [place], [place] [address] [phone]

## 2017-02-07 ENCOUNTER — Telehealth: Payer: Self-pay | Admitting: Family Medicine

## 2017-02-07 NOTE — Telephone Encounter (Signed)
Patient advised of abnormal CT results and that a referral has been placed to thoracic oncology for further evaluation of lung nodules and adenopathy. Patient also provided the phone number to call and reschedule echocardiogram.   [name], MSN, FNP-C [facility]  [address] [place], [place] [address] [phone]

## 2017-02-08 DIAGNOSIS — R918 Other nonspecific abnormal finding of lung field: Secondary | ICD-10-CM | POA: Insufficient documentation

## 2017-02-11 ENCOUNTER — Telehealth: Payer: Self-pay | Admitting: Family Medicine

## 2017-02-11 ENCOUNTER — Ambulatory Visit (HOSPITAL_COMMUNITY)
Admission: RE | Admit: 2017-02-11 | Discharge: 2017-02-11 | Disposition: A | Discharge status: Home / Self Care | Payer: Self-pay | Source: Ambulatory Visit | Attending: Family Medicine | Admitting: Family Medicine

## 2017-02-11 DIAGNOSIS — E669 Obesity, unspecified: Secondary | ICD-10-CM | POA: Insufficient documentation

## 2017-02-11 DIAGNOSIS — R0602 Shortness of breath: Secondary | ICD-10-CM

## 2017-02-11 DIAGNOSIS — Z87891 Personal history of nicotine dependence: Secondary | ICD-10-CM | POA: Insufficient documentation

## 2017-02-11 NOTE — Telephone Encounter (Signed)
Anthony [name],  Contact patient to advise that recent echocardiogram was unremarkable and that oncology will be contacting him to schedule his consultative visit.  Also please follow-up on referral I placed with oncology. There is a note in the system that patient is to be scheduled with Dr. Gweneth Dimitri with thoracic oncology, however I do not see in telephone notes that patient has been contacted.    [name], MSN, FNP-C [facility]  [address] [place], [place] [address] [phone]

## 2017-02-11 NOTE — Progress Notes (Signed)
  Echocardiogram 2D Echocardiogram has been performed.  Anthony [name] 02/11/2017, 11:06 AM

## 2017-02-12 ENCOUNTER — Encounter: Payer: Self-pay | Admitting: *Deleted

## 2017-02-12 NOTE — Progress Notes (Signed)
Oncology Nurse Navigator Documentation  Oncology Nurse Navigator Flowsheets 02/12/2017  Navigator Location [facility]  Navigator Encounter Type Other/I updated Dr. Arbutus Ped on patient recent scan. He stated patient can be seen by Dr. Gweneth Dimitri.  I updated new patient coordinator to schedule with him.   Treatment Phase Abnormal Scans  Barriers/Navigation Needs Coordination of Care  Interventions Coordination of Care  Coordination of Care Other  Acuity Level 1  Time Spent with Patient 15

## 2017-02-13 ENCOUNTER — Telehealth: Payer: Self-pay | Admitting: Hematology and Oncology

## 2017-02-13 ENCOUNTER — Encounter: Payer: Self-pay | Admitting: Hematology and Oncology

## 2017-02-13 NOTE — Telephone Encounter (Signed)
APPT HAS BEEN SCHEDULED FOR THE PT TO SEE DR. PERLOV ON 10/8 AT 10AM. PT AWARE TO ARRIVE 30 MINUTES EARLY. ADDRESS VERIFIED. PT STATED HE IS SELF PAY. LETTER MAILED.

## 2017-02-17 ENCOUNTER — Encounter: Payer: Self-pay | Admitting: Hematology and Oncology

## 2017-02-17 ENCOUNTER — Telehealth: Payer: Self-pay | Admitting: Hematology and Oncology

## 2017-02-17 ENCOUNTER — Ambulatory Visit (HOSPITAL_BASED_OUTPATIENT_CLINIC_OR_DEPARTMENT_OTHER): Payer: Self-pay | Admitting: Hematology and Oncology

## 2017-02-17 ENCOUNTER — Ambulatory Visit (HOSPITAL_BASED_OUTPATIENT_CLINIC_OR_DEPARTMENT_OTHER): Payer: Self-pay

## 2017-02-17 VITALS — BP 135/66 | HR 96 | Temp 99.3°F | Resp 21 | Ht 68.0 in | Wt >= 6400 oz

## 2017-02-17 DIAGNOSIS — R590 Localized enlarged lymph nodes: Secondary | ICD-10-CM

## 2017-02-17 DIAGNOSIS — R634 Abnormal weight loss: Secondary | ICD-10-CM

## 2017-02-17 DIAGNOSIS — R61 Generalized hyperhidrosis: Secondary | ICD-10-CM

## 2017-02-17 DIAGNOSIS — R0602 Shortness of breath: Secondary | ICD-10-CM

## 2017-02-17 DIAGNOSIS — R918 Other nonspecific abnormal finding of lung field: Secondary | ICD-10-CM

## 2017-02-17 DIAGNOSIS — R630 Anorexia: Secondary | ICD-10-CM

## 2017-02-17 DIAGNOSIS — R05 Cough: Secondary | ICD-10-CM

## 2017-02-17 DIAGNOSIS — R509 Fever, unspecified: Secondary | ICD-10-CM

## 2017-02-17 DIAGNOSIS — R6881 Early satiety: Secondary | ICD-10-CM

## 2017-02-17 LAB — COMPREHENSIVE METABOLIC PANEL
ALBUMIN: 3.5 g/dL (ref 3.5–5.0)
ALK PHOS: 78 U/L (ref 40–150)
ALT: 27 U/L (ref 0–55)
AST: 26 U/L (ref 5–34)
Anion Gap: 7 mEq/L (ref 3–11)
BUN: 7.1 mg/dL (ref 7.0–26.0)
CALCIUM: 9.6 mg/dL (ref 8.4–10.4)
CO2: 28 mEq/L (ref 22–29)
CREATININE: 0.9 mg/dL (ref 0.7–1.3)
Chloride: 103 mEq/L (ref 98–109)
EGFR: >90 mL/min/{1.73_m2} (ref >90)
GLUCOSE: 91 mg/dL (ref 70–140)
Potassium: 4.2 mEq/L (ref 3.5–5.1)
Sodium: 138 mEq/L (ref 136–145)
TOTAL PROTEIN: 8.1 g/dL (ref 6.4–8.3)
Total Bilirubin: 0.55 mg/dL (ref 0.20–1.20)

## 2017-02-17 LAB — CBC WITH DIFFERENTIAL/PLATELET
BASO%: 0.8 % (ref 0.0–2.0)
BASOS ABS: 0.1 10*3/uL (ref 0.0–0.1)
EOS%: 5.3 % (ref 0.0–7.0)
Eosinophils Absolute: 0.4 10*3/uL (ref 0.0–0.5)
HEMATOCRIT: 44.3 % (ref 38.4–49.9)
HEMOGLOBIN: 14.6 g/dL (ref 13.0–17.1)
LYMPH#: 1.5 10*3/uL (ref 0.9–3.3)
LYMPH%: 22.2 % (ref 14.0–49.0)
MCH: 29 pg (ref 27.2–33.4)
MCHC: 33 g/dL (ref 32.0–36.0)
MCV: 88 fL (ref 79.3–98.0)
MONO#: 1 10*3/uL — ABNORMAL HIGH (ref 0.1–0.9)
MONO%: 14.3 % — ABNORMAL HIGH (ref 0.0–14.0)
NEUT%: 57.4 % (ref 39.0–75.0)
NEUTROS ABS: 3.8 10*3/uL (ref 1.5–6.5)
Platelets: 278 10*3/uL (ref 140–400)
RBC: 5.04 10*6/uL (ref 4.20–5.82)
RDW: 14.1 % (ref 11.0–14.6)
WBC: 6.7 10*3/uL (ref 4.0–10.3)

## 2017-02-17 LAB — LACTATE DEHYDROGENASE: LDH: 295 U/L — AB (ref 125–245)

## 2017-02-17 NOTE — Telephone Encounter (Signed)
Scheduled appt per 10/8 los - Gave patient AVS and calender per los.  

## 2017-02-18 LAB — C-REACTIVE PROTEIN: CRP: 55 mg/L — AB (ref 0.0–4.9)

## 2017-02-18 LAB — ANGIOTENSIN CONVERTING ENZYME: Angio Convert Enzyme: 83 U/L — ABNORMAL HIGH (ref 14–82)

## 2017-02-18 LAB — SEDIMENTATION RATE: SED RATE: 29 mm/h — AB (ref 0–15)

## 2017-02-19 ENCOUNTER — Encounter: Payer: Self-pay | Admitting: Hematology and Oncology

## 2017-02-19 NOTE — Assessment & Plan Note (Signed)
37 y.o. male with presentation of protracted cough, shortness of breath with bilateral multifocal pulmonary nodules and mediastinal lymphadenopathy in the context of significant generalized symptoms including subjective fevers, night sweats, decreased appetite, early satiety, and significant weight loss.  Differential diagnosis includes autoimmune, infectious, and malignant etiologies. For me, on top of the list or sarcoidosis, lymphoproliferative condition such as Hodgkin's lymphoma or non-Hodgkin's lymphomas.  Plan: --Labs today as outlined below --CT A/P --Consult pulmonology for possible bronchoscopy/endobronchial ultrasound with lymph node biopsy. --RTC 1 week to review

## 2017-02-19 NOTE — Progress Notes (Signed)
[facility] New Visit:  Assessment: Abnormal chest x-ray with multiple lung nodules 36 y.o. male with presentation of protracted cough, shortness of breath with bilateral multifocal pulmonary nodules and mediastinal lymphadenopathy in the context of significant generalized symptoms including subjective fevers, night sweats, decreased appetite, early satiety, and significant weight loss.  Differential diagnosis includes autoimmune, infectious, and malignant etiologies. For me, on top of the list or sarcoidosis, lymphoproliferative condition such as Hodgkin's lymphoma or non-Hodgkin's lymphomas.  Plan: --Labs today as outlined below --CT A/P --Consult pulmonology for possible bronchoscopy/endobronchial ultrasound with lymph node biopsy. --RTC 1 week to review  Voice recognition software was used and creation of this note. Despite my best effort at editing the text, some misspelling/errors may have occurred.  Orders Placed This Encounter  Procedures  . CT Abdomen Pelvis W Contrast    Standing Status:   Future    Standing Expiration Date:   02/17/2018    Order Specific Question:   If indicated for the ordered procedure, I authorize the administration of contrast media per Radiology protocol    Answer:   Yes    Order Specific Question:   Preferred imaging location?    Answer:   [facility]    Order Specific Question:   Radiology Contrast Protocol - do NOT remove file path    Answer:   [email or web address]  . CBC with Differential    Standing Status:   Future    Number of Occurrences:   1    Standing Expiration Date:   02/17/2018  . Comprehensive metabolic panel    Standing Status:   Future    Number of Occurrences:   1    Standing Expiration Date:   02/17/2018  . Lactate dehydrogenase (LDH)    Standing Status:   Future    Number of Occurrences:   1    Standing Expiration Date:   02/17/2018  . Angiotensin converting enzyme     Standing Status:   Future    Number of Occurrences:   1    Standing Expiration Date:   02/17/2018  . Sedimentation rate    Standing Status:   Future    Number of Occurrences:   1    Standing Expiration Date:   02/17/2018  . C-reactive protein    Standing Status:   Future    Number of Occurrences:   1    Standing Expiration Date:   02/17/2018  . Ambulatory referral to Pulmonology    Referral Priority:   Routine    Referral Type:   Consultation    Referral Reason:   Specialty Services Required    Requested Specialty:   Pulmonary Disease    Number of Visits Requested:   1    All questions were answered.  . The patient knows to call the clinic with any problems, questions or concerns.  This note was electronically signed.    History of Presenting Illness Anthony [name] 36 y.o. presenting to the [facility] for Finding of urinary nodules and mediastinal lymphadenopathy. Patient was diagnosed with pneumonia with protracted period of cough which has started approximately 6 weeks ago. At the end of August, patient was diagnosed with pneumonia and treated with antibiotics without significant relief. Due to elevated d-dimer and inability to obtain lung imaging due to patient's habitus patient was also started on her Rivaroxaban (Xarelto) for suspected pulmonary embolus. Patient continues to have cough to the point off intolerable feeds, associated with shortness of  breath. He has developed discomfort in the left upper quadrant/left lower ribs. Presently, having fevers, chills, 2 episodes of drain teaching night sweats patient reports significant loss of appetite for the past 4 weeks associated with early satiety and weight loss of approximately 40 pounds. No constipation, one episode of watery diarrhea without blood or melena. Has been having generalized body aches and muscle discomfort since Saturday.  He was seen in the emergency room on 02/05/17 and CT scan of the chest was obtained due to  unremitting cough. The CT scan demonstrates extensive lymphadenopathy throughout the mediastinum measuring up to 4.0 cm as well as at least for pulmonary lesions including 2.6 cm lesion in the left upper lobe, 1.9 cm left upper lobe lesion, 3.2 cm right lower lobe lesion, and 1.5 cm lesion in the lingula. No reported adrenal lesions at that time.  Oncological/hematological History:  Medical History: Past Medical History:  Diagnosis Date  . Obese     Surgical History: Past Surgical History:  Procedure Laterality Date  . ARTHROSCOPY KNEE W/ DRILLING      Family History: Family History  Problem Relation Age of Onset  . Hypertension Father   . Diabetes Father   . Stroke Father   . Cancer Paternal Uncle     Social History: Social History   Social History  . Marital status: Married    Spouse name: N/A  . Number of children: N/A  . Years of education: N/A   Occupational History  . Not on file.   Social History Main Topics  . Smoking status: Former Smoker    Packs/day: 1.00    Years: 10.00    Types: Cigarettes    Quit date: 09/30/2016  . Smokeless tobacco: Never Used  . Alcohol use No  . Drug use: No  . Sexual activity: Yes    Birth control/ protection: Condom   Other Topics Concern  . Not on file   Social History Narrative  . No narrative on file    Allergies: No Known Allergies  Medications:  Current Outpatient Prescriptions  Medication Sig Dispense Refill  . oxyCODONE-acetaminophen (PERCOCET) 10-325 MG per tablet Take 0.5-1 tablets by mouth 3 (three) times daily as needed for pain. pain    . albuterol (PROVENTIL HFA;VENTOLIN HFA) 108 (90 Base) MCG/ACT inhaler Inhale 2 puffs into the lungs every 4 (four) hours as needed for wheezing or shortness of breath (cough, shortness of breath or wheezing.). 1 Inhaler 1  . amoxicillin-clavulanate (AUGMENTIN) 875-125 MG tablet Take 1 tablet by mouth 2 (two) times daily. (Patient not taking: Reported on 02/03/2017) 20  tablet 0  . azithromycin (ZITHROMAX) 250 MG tablet Take 2 tabs PO x 1 dose, then 1 tab PO QD x 4 days (Patient not taking: Reported on 02/03/2017) 6 tablet 0  . ciprofloxacin (CILOXAN) 0.3 % ophthalmic solution Place 1 drop into both eyes every 4 (four) hours while awake. (Patient not taking: Reported on 02/03/2017) 5 mL 0  . hydrocortisone 2.5 % lotion Apply topically 2 (two) times daily. (Patient not taking: Reported on 01/17/2017) 59 mL 0  . methocarbamol (ROBAXIN) 500 MG tablet Take 1 tablet (500 mg total) by mouth 2 (two) times daily. (Patient not taking: Reported on 01/17/2017) 20 tablet 0  . Rivaroxaban 15 & 20 MG TBPK Take as directed on package (Patient not taking: Reported on 02/03/2017) 51 each 0   No current facility-administered medications for this visit.     Review of Systems: Review of Systems  Constitutional:  Positive for appetite change, chills, fatigue, fever and unexpected weight change.       Night sweats  Respiratory: Positive for cough.        Left upper quadrant/left lower rib discomfort. Dry cough without hemoptysis  Gastrointestinal: Positive for diarrhea. Negative for blood in stool, constipation and nausea.       Early satiety  Musculoskeletal: Positive for arthralgias and myalgias.     PHYSICAL EXAMINATION Blood pressure 135/66, pulse 96, temperature 99.3 F (37.4 C), temperature source Oral, resp. rate (!) 21, height _0  (1.727 m), weight (!) 509 lb 9.6 oz (231.2 kg), SpO2 98 %.  ECOG PERFORMANCE STATUS: 2 - Symptomatic, <50% confined to bed  Physical Exam  Constitutional: He is oriented to person, place, and time and well-developed, well-nourished, and in no distress. No distress.  HENT:  Head: Normocephalic and atraumatic.  Mouth/Throat: Oropharynx is clear and moist. No oropharyngeal exudate.  Eyes: Pupils are equal, round, and reactive to light. Conjunctivae are normal. Right eye exhibits no discharge. No scleral icterus.  Neck: No thyromegaly present.   Cardiovascular: Normal rate and regular rhythm.   No murmur heard. Heart sounds are distant  Pulmonary/Chest: Effort normal. He has no wheezes. He has no rales.  Abdominal: Soft. He exhibits no distension. There is no tenderness.  Obese, extent of obesity limits evaluation for possible hepatosplenomegaly or intra-abdominal mass lesions.  Musculoskeletal: He exhibits edema.  Lymphadenopathy:    He has no cervical adenopathy.  Neurological: He is alert and oriented to person, place, and time. He has normal reflexes. No cranial nerve deficit.  Skin: Skin is warm. No rash noted. He is diaphoretic. No erythema.  Skin of the palms appears to be thickened, fissures present without active bleeding at this time.     LABORATORY DATA: I have personally reviewed the data as listed: Appointment on 02/17/2017  Component Date Value Ref Range Status  . WBC 02/17/2017 6.7  4.0 - 10.3 10e3/uL Final  . NEUT# 02/17/2017 3.8  1.5 - 6.5 10e3/uL Final  . HGB 02/17/2017 14.6  13.0 - 17.1 g/dL Final  . HCT 02/17/2017 44.3  38.4 - 49.9 % Final  . Platelets 02/17/2017 278  140 - 400 10e3/uL Final  . MCV 02/17/2017 88.0  79.3 - 98.0 fL Final  . MCH 02/17/2017 29.0  27.2 - 33.4 pg Final  . MCHC 02/17/2017 33.0  32.0 - 36.0 g/dL Final  . RBC 02/17/2017 5.04  4.20 - 5.82 10e6/uL Final  . RDW 02/17/2017 14.1  11.0 - 14.6 % Final  . lymph# 02/17/2017 1.5  0.9 - 3.3 10e3/uL Final  . MONO# 02/17/2017 1.0* 0.1 - 0.9 10e3/uL Final  . Eosinophils Absolute 02/17/2017 0.4  0.0 - 0.5 10e3/uL Final  . Basophils Absolute 02/17/2017 0.1  0.0 - 0.1 10e3/uL Final  . NEUT% 02/17/2017 57.4  39.0 - 75.0 % Final  . LYMPH% 02/17/2017 22.2  14.0 - 49.0 % Final  . MONO% 02/17/2017 14.3* 0.0 - 14.0 % Final  . EOS% 02/17/2017 5.3  0.0 - 7.0 % Final  . BASO% 02/17/2017 0.8  0.0 - 2.0 % Final  . Sodium 02/17/2017 138  136 - 145 mEq/L Final  . Potassium 02/17/2017 4.2  3.5 - 5.1 mEq/L Final  . Chloride 02/17/2017 103  98 - 109 mEq/L  Final  . CO2 02/17/2017 28  22 - 29 mEq/L Final  . Glucose 02/17/2017 91  70 - 140 mg/dl Final   Glucose reference range is for nonfasting  patients. Fasting glucose reference range is 70- 100.  Marland Kitchen BUN 02/17/2017 7.1  7.0 - 26.0 mg/dL Final  . Creatinine 02/17/2017 0.9  0.7 - 1.3 mg/dL Final  . Total Bilirubin 02/17/2017 0.55  0.20 - 1.20 mg/dL Final  . Alkaline Phosphatase 02/17/2017 78  40 - 150 U/L Final  . AST 02/17/2017 26  5 - 34 U/L Final  . ALT 02/17/2017 27  0 - 55 U/L Final  . Total Protein 02/17/2017 8.1  6.4 - 8.3 g/dL Final  . Albumin 02/17/2017 3.5  3.5 - 5.0 g/dL Final  . Calcium 02/17/2017 9.6  8.4 - 10.4 mg/dL Final  . Anion Gap 02/17/2017 7  3 - 11 mEq/L Final  . EGFR 02/17/2017 >90  >90 ml/min/1.73 m2 Final   eGFR is calculated using the CKD-EPI Creatinine Equation (2009)  . LDH 02/17/2017 295* 125 - 245 U/L Final  . Angio Convert Enzyme 02/17/2017 83* 14 - 82 U/L Final  . Sedimentation Rate-Westergren 02/17/2017 29* 0 - 15 mm/hr Final  . CRP 02/17/2017 55.0* 0.0 - 4.9 mg/L Final        Ardath Sax, MD

## 2017-02-21 ENCOUNTER — Ambulatory Visit (HOSPITAL_COMMUNITY)
Admission: RE | Admit: 2017-02-21 | Discharge: 2017-02-21 | Disposition: A | Discharge status: Home / Self Care | Payer: Self-pay | Source: Ambulatory Visit | Attending: Hematology and Oncology | Admitting: Hematology and Oncology

## 2017-02-21 ENCOUNTER — Ambulatory Visit: Payer: Commercial Managed Care - PPO | Admitting: Family Medicine

## 2017-02-21 DIAGNOSIS — R918 Other nonspecific abnormal finding of lung field: Secondary | ICD-10-CM | POA: Insufficient documentation

## 2017-02-21 MED ORDER — IOPAMIDOL (ISOVUE-300) INJECTION 61%
INTRAVENOUS | Status: AC
  Administered 2017-02-21: 150 mL
  Filled 2017-02-21: qty 150

## 2017-02-21 MED ORDER — IOPAMIDOL (ISOVUE-300) INJECTION 61%
INTRAVENOUS | Status: AC
  Filled 2017-02-21: qty 75

## 2017-02-24 ENCOUNTER — Ambulatory Visit (HOSPITAL_BASED_OUTPATIENT_CLINIC_OR_DEPARTMENT_OTHER): Payer: Self-pay | Admitting: Hematology and Oncology

## 2017-02-24 ENCOUNTER — Encounter: Payer: Self-pay | Admitting: Hematology and Oncology

## 2017-02-24 ENCOUNTER — Telehealth: Payer: Self-pay

## 2017-02-24 VITALS — BP 146/72 | HR 68 | Temp 99.2°F | Resp 19 | Ht 68.0 in | Wt >= 6400 oz

## 2017-02-24 DIAGNOSIS — D862 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: Secondary | ICD-10-CM

## 2017-02-24 NOTE — Telephone Encounter (Signed)
Printed avs and calender for upcoming appoiintment. oper 10/15 los

## 2017-02-28 ENCOUNTER — Ambulatory Visit (INDEPENDENT_AMBULATORY_CARE_PROVIDER_SITE_OTHER): Payer: Self-pay | Admitting: Emergency Medicine

## 2017-02-28 ENCOUNTER — Encounter: Payer: Self-pay | Admitting: Emergency Medicine

## 2017-02-28 DIAGNOSIS — R918 Other nonspecific abnormal finding of lung field: Secondary | ICD-10-CM

## 2017-02-28 NOTE — Progress Notes (Signed)
Subjective:    Patient ID: Anthony [name], male    DOB: 08-14-1980, 36 y.o.   MRN: [id number]  HPI  36 year old overweight gentleman with a minimal tobacco history, OSA on CPAP, who is referred from Dr Lebron Conners for evaluation of bilateral nodular infiltrates and mediastinal/hilar lymphadenopathy. He was experiencing SOB which was treated 12/2016 as possible pneumonia. The SOB persisted and he developed cough, ultimately a CT chest was done on 02/05/17 that I have reviewed. This shows lateral nodular infiltrates, central in nature, also with significant mediastinal and hilar lymphadenopathy. He was seen by Dr. Lebron Conners with oncology to further evaluate. Referred now to pursue tissue diagnosis.   He has been experiencing subjective fevers, sweats and decreased appetite. He has exertional dyspnea, especially with stairs. He had some eye swelling that has improved.   Labs showed elevated LDH at 295, ESR 29, ACE level 83, c-RP 55 (markedly elevated).   Review of Systems  Constitutional: Positive for appetite change and unexpected weight change. Negative for fever.  HENT: Positive for congestion and dental problem. Negative for ear pain, nosebleeds, postnasal drip, rhinorrhea, sinus pressure, sneezing, sore throat and trouble swallowing.   Eyes: Negative for redness and itching.  Respiratory: Positive for cough and shortness of breath. Negative for chest tightness and wheezing.   Cardiovascular: Positive for chest pain. Negative for palpitations and leg swelling.  Gastrointestinal: Positive for abdominal pain. Negative for nausea and vomiting.  Genitourinary: Negative for dysuria.  Musculoskeletal: Negative for joint swelling.  Skin: Negative for rash.  Neurological: Positive for headaches.  Hematological: Does not bruise/bleed easily.  Psychiatric/Behavioral: Positive for dysphoric mood. The patient is nervous/anxious.    Past Medical History:  Diagnosis Date  . Eczema of hand   . Obese       Family History  Problem Relation Age of Onset  . Hypertension Father   . Diabetes Father   . Stroke Father   . Lymphoma Paternal Uncle   . Ovarian cancer Maternal Grandmother 74     Social History   Social History  . Marital status: Married    Spouse name: N/A  . Number of children: N/A  . Years of education: N/A   Occupational History  . Disabled    Social History Main Topics  . Smoking status: Former Smoker    Packs/day: 0.30    Years: 10.00    Types: Cigarettes    Quit date: 09/30/2016  . Smokeless tobacco: Never Used  . Alcohol use No  . Drug use: No  . Sexual activity: Yes    Birth control/ protection: Condom   Other Topics Concern  . Not on file   Social History Narrative  . 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   No Known Allergies   Outpatient Medications Prior to Visit  Medication Sig Dispense Refill  . albuterol (PROVENTIL HFA;VENTOLIN HFA) 108 (90 Base) MCG/ACT inhaler Inhale 2 puffs into the lungs every 4 (four) hours as needed for wheezing or shortness of breath (cough, shortness of breath or wheezing.). 1 Inhaler 1  . oxyCODONE-acetaminophen (PERCOCET) 10-325 MG per tablet Take 0.5-1 tablets by mouth 3 (three) times daily as needed for pain. pain    . amoxicillin-clavulanate (AUGMENTIN) 875-125 MG tablet Take 1 tablet by mouth 2 (two) times daily. 20 tablet 0  . azithromycin (ZITHROMAX) 250 MG tablet Take 2 tabs PO x 1 dose, then 1 tab PO QD x 4 days  6 tablet 0  . ciprofloxacin (CILOXAN) 0.3 % ophthalmic solution Place 1 drop into both eyes every 4 (four) hours while awake. 5 mL 0  . hydrocortisone 2.5 % lotion Apply topically 2 (two) times daily. 59 mL 0  . methocarbamol (ROBAXIN) 500 MG tablet Take 1 tablet (500 mg total) by mouth 2 (two) times daily. 20 tablet 0  . Rivaroxaban 15 & 20 MG TBPK Take as directed on package 51 each 0   No facility-administered medications prior to visit.          Objective:   Physical Exam Vitals:   02/28/17 1428 02/28/17 1429  BP:  132/80  Pulse:  88  SpO2:  96%  Weight: (!) 504 lb (228.6 kg)   Height: _0  (1.727 m)    Gen: Pleasant, obese man, in no distress,  normal affect  ENT: No lesions,  mouth clear,  oropharynx clear, no postnasal drip  Neck: No JVD, no TMG, no carotid bruits  Lungs: No use of accessory muscles, clear without rales or rhonchi  Cardiovascular: RRR, heart sounds normal, no murmur or gallops, no peripheral edema  Musculoskeletal: No deformities, no cyanosis or clubbing  Neuro: alert, non focal  Skin: Warm, no lesions or rashes    02/05/17 --  COMPARISON:  Chest radiograph January 17, 2017  FINDINGS: Cardiovascular: There is no appreciable thoracic aortic aneurysm. Visualized great vessels appear unremarkable on this noncontrast enhanced study. Pericardium is not appreciably thickened.  Mediastinum/Nodes: Visualized thyroid appears unremarkable. No esophageal lesions are evident.  There is extensive adenopathy throughout the thoracic region. There is a lymph node in the aortopulmonary window region measuring 4.0 x 2.8 cm. There are several right paratracheal and pretracheal lymph nodes, largest measuring 2.8 x 2.8 cm. There are multiple enlarged hilar lymph nodes, largest measuring 3.1 x 2.9 cm. There is left hilar adenopathy with the largest lymph node appreciable in the left hilum measuring 3.4 x 2.9 cm. There is sub- carinal adenopathy with the largest sub- carinal lymph node appreciable measuring 2.9 x 2.7 cm.  Lungs/Pleura: There is a masslike area in the anterior segment of the left upper lobe near the apex measuring 2.6 x 2.0 x 2.1 cm. This area has irregular margins. There is a somewhat spiculated nodular opacity abutting the major fissure in the posterior segment of the left upper lobe measuring 1.9 x 1.5 x 1.5 cm. A nearby nodular opacity in this same area measures 1.3 x  1.0 x 1.0 cm. There are confluent opacities in the right perihilar region with involvement in portions of the right middle and lower lobes at several sites. These areas appear more infiltrative and nodular all the right, although there is a somewhat irregular opacity in the medial segment of the right lower lobe measuring 3.2 x 3.0 x 2.2 cm. There is a nodular opacity in the inferior lingula measuring 1.5 x 1.0 x 1.0 cm. There is a 5 mm nodular opacity in the anterior segment right upper lobe seen on axial slice 30 series 8. There is no pleural effusion or pleural thickening evident.  Upper Abdomen: Liver appears prominent but incompletely visualized. Visualized spleen appears normal. Visualized upper abdominal structures otherwise appear unremarkable.  Musculoskeletal: There is degenerative change in the thoracic spine. There are no blastic or lytic bone lesions.  IMPRESSION: 1. Multiple irregular nodular appearing opacities on the left, concerning for multifocal neoplasia.  2. Areas of confluent opacity at multiple sites on the right, most notably in the right middle  and lower lobes. These areas appear more infiltrative than neoplastic, although neoplasm with superimposed pneumonitis is certainly possible in one or more of these areas.  3. Extensive multifocal adenopathy throughout the hila and mediastinal regions. This appearance heightened suspicion for underlying neoplasm.  4. Given the changes in the lungs, correlation with bronchoscopy may well be warranted. Nuclear medicine PET study also could be helpful to assess for degree of metabolic activity in areas of abnormal lung parenchyma and enlarged lymph nodes.      Assessment & Plan:  Abnormal chest x-ray with multiple lung nodules CT scan of the chest performed in the setting of cough, dyspnea with bilateral patchy nodular infiltrates, mediastinal lymphadenopathy. Elevated Ace level suggestive of sarcoidosis.  Agree that he needs nodal and lung biopsies performed. Best strategy would be endobronchial ultrasound. We will work on arranging this as soon as possible.  Baltazar Apo, MD, PhD 02/28/2017, 3:00 PM [facility] [phone] or if no answer [phone]

## 2017-02-28 NOTE — Patient Instructions (Addendum)
We reviewed your CT scan of the chest today. We will work on arranging a bronchoscopy with endobronchial ultrasound to allow [place] to biopsy your chest lymph nodes and your lung tissue. Follow with Dr Delton Coombes in 1 month

## 2017-02-28 NOTE — Assessment & Plan Note (Signed)
CT scan of the chest performed in the setting of cough, dyspnea with bilateral patchy nodular infiltrates, mediastinal lymphadenopathy. Elevated Ace level suggestive of sarcoidosis. Agree that he needs nodal and lung biopsies performed. Best strategy would be endobronchial ultrasound. We will work on arranging this as soon as possible.

## 2017-03-02 NOTE — Progress Notes (Signed)
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 male with presentation of protracted cough, shortness of breath with bilateral multifocal pulmonary nodules and mediastinal lymphadenopathy in the context of significant generalized symptoms including subjective fevers, night sweats, decreased appetite, early satiety, and significant weight loss.  Our additional assessment demonstrates no evidence of lymphadenopathy or other abnormal findings in the abdomen and pelvis. I worry demonstrates elevated inflammatory markers and elevation of ACE further supporting our previous suspicion for possible pulmonary sarcoidosis.at this time, patient plan for pulmonology assessment and will likely undergo bronchoscopy and possibly EBUS at that time.  Plan: --proceed with pulmonary assessment at discretion of pulmonology clinic --return to my clinic for follow-up two weeks after bronchoscopy.  Voice recognition software was used and creation of this note. Despite my best effort at editing the text, some misspelling/errors may have occurred.   No orders of the defined types were placed in this encounter.   Cancer Staging No matching staging information was found for the patient.  All questions were answered. . The patient knows to call the clinic with any problems, questions or concerns.  This note was electronically signed.    History of Presenting Illness Anthony [name] is a 36 y.o. male followed in the [facility] for finding of Pulmonary nodules and mediastinal lymphadenopathy. Patient was diagnosed with pneumonia with protracted period of cough which has started approximately 6 weeks ago. At the end of August, patient was diagnosed with pneumonia and treated with antibiotics without significant relief. Due to elevated d-dimer and inability to obtain lung imaging due to patient's habitus patient was also started on her  Rivaroxaban (Xarelto) for suspected pulmonary embolus. Patient continues to have cough to the point off intolerable feeds, associated with shortness of breath. He has developed discomfort in the left upper quadrant/left lower ribs. Presently, having fevers, chills, 2 episodes of drain teaching night sweats patient reports significant loss of appetite for the past 4 weeks associated with early satiety and weight loss of approximately 40 pounds. No constipation, one episode of watery diarrhea without blood or melena. Has been having generalized body aches and muscle discomfort since Saturday.  He was seen in the emergency room on 02/05/17 and CT scan of the chest was obtained due to unremitting cough. The CT scan demonstrates extensive lymphadenopathy throughout the mediastinum measuring up to 4.0 cm as well as at least for pulmonary lesions including 2.6 cm lesion in the left upper lobe, 1.9 cm left upper lobe lesion, 3.2 cm right lower lobe lesion, and 1.5 cm lesion in the lingula. No reported adrenal lesions at that time. Patient returns to the clinic today to review results of additional evaluation. He denies a new complaint since last visit to the clinic.   Oncological/hematological History:  No history exists.    Medical History: Past Medical History:  Diagnosis Date  . Eczema of hand   . Obese     Surgical History: Past Surgical History:  Procedure Laterality Date  . ARTHROSCOPY KNEE W/ DRILLING      Family History: Family History  Problem Relation Age of Onset  . Hypertension Father   . Diabetes Father   . Stroke Father   . Lymphoma Paternal Uncle   . Ovarian cancer Maternal Grandmother 60    Social History: Social History   Social History  . Marital status: Married    Spouse name: N/A  . Number of children: N/A  .  Years of education: N/A   Occupational History  . Disabled    Social History Main Topics  . Smoking status: Former Smoker    Packs/day: 0.30    Years:  10.00    Types: Cigarettes    Quit date: 09/30/2016  . Smokeless tobacco: Never Used  . Alcohol use No  . Drug use: No  . Sexual activity: Yes    Birth control/ protection: Condom   Other Topics Concern  . Not on file   Social History Narrative  . No narrative on file    Allergies: No Known Allergies  Medications:  Current Outpatient Prescriptions  Medication Sig Dispense Refill  . albuterol (PROVENTIL HFA;VENTOLIN HFA) 108 (90 Base) MCG/ACT inhaler Inhale 2 puffs into the lungs every 4 (four) hours as needed for wheezing or shortness of breath (cough, shortness of breath or wheezing.). 1 Inhaler 1  . oxyCODONE-acetaminophen (PERCOCET) 10-325 MG per tablet Take 0.5-1 tablets by mouth 3 (three) times daily as needed for pain. pain    . naproxen (NAPROSYN) 500 MG tablet Take 500 mg by mouth 2 (two) times daily with a meal.     No current facility-administered medications for this visit.     Review of Systems: Review of Systems  Constitutional: Positive for appetite change, chills, fatigue, fever and unexpected weight change.       Night sweats  Respiratory: Positive for cough.        Left upper quadrant/left lower rib discomfort. Dry cough without hemoptysis  Gastrointestinal: Positive for diarrhea. Negative for blood in stool, constipation and nausea.       Early satiety  Musculoskeletal: Positive for arthralgias and myalgias.     PHYSICAL EXAMINATION Blood pressure (!) 146/72, pulse 68, temperature 99.2 F (37.3 C), temperature source Oral, resp. rate 19, height _0  (1.727 m), weight (!) 503 lb (228.2 kg), SpO2 99 %.  ECOG PERFORMANCE STATUS: 2 - Symptomatic, <50% confined to bed  Physical Exam  Constitutional: He is oriented to person, place, and time and well-developed, well-nourished, and in no distress. No distress.  HENT:  Head: Normocephalic and atraumatic.  Mouth/Throat: Oropharynx is clear and moist. No oropharyngeal exudate.  Eyes: Pupils are equal,  round, and reactive to light. Conjunctivae are normal. Right eye exhibits no discharge. No scleral icterus.  Neck: No thyromegaly present.  Cardiovascular: Normal rate and regular rhythm.   No murmur heard. Heart sounds are distant  Pulmonary/Chest: Effort normal. He has no wheezes. He has no rales.  Abdominal: Soft. He exhibits no distension. There is no tenderness.  Obese, extent of obesity limits evaluation for possible hepatosplenomegaly or intra-abdominal mass lesions.  Musculoskeletal: He exhibits edema.  Lymphadenopathy:    He has no cervical adenopathy.  Neurological: He is alert and oriented to person, place, and time. He has normal reflexes. No cranial nerve deficit.  Skin: Skin is warm. No rash noted. He is diaphoretic. No erythema.  Skin of the palms appears to be thickened, fissures present without active bleeding at this time.     LABORATORY DATA: I have personally reviewed the data as listed: No visits with results within 1 Week(s) from this visit.  Latest known visit with results is:  Appointment on 02/17/2017  Component Date Value Ref Range Status  . WBC 02/17/2017 6.7  4.0 - 10.3 10e3/uL Final  . NEUT# 02/17/2017 3.8  1.5 - 6.5 10e3/uL Final  . HGB 02/17/2017 14.6  13.0 - 17.1 g/dL Final  . HCT 02/17/2017 44.3  38.4 -  49.9 % Final  . Platelets 02/17/2017 278  140 - 400 10e3/uL Final  . MCV 02/17/2017 88.0  79.3 - 98.0 fL Final  . MCH 02/17/2017 29.0  27.2 - 33.4 pg Final  . MCHC 02/17/2017 33.0  32.0 - 36.0 g/dL Final  . RBC 02/17/2017 5.04  4.20 - 5.82 10e6/uL Final  . RDW 02/17/2017 14.1  11.0 - 14.6 % Final  . lymph# 02/17/2017 1.5  0.9 - 3.3 10e3/uL Final  . MONO# 02/17/2017 1.0* 0.1 - 0.9 10e3/uL Final  . Eosinophils Absolute 02/17/2017 0.4  0.0 - 0.5 10e3/uL Final  . Basophils Absolute 02/17/2017 0.1  0.0 - 0.1 10e3/uL Final  . NEUT% 02/17/2017 57.4  39.0 - 75.0 % Final  . LYMPH% 02/17/2017 22.2  14.0 - 49.0 % Final  . MONO% 02/17/2017 14.3* 0.0 - 14.0 %  Final  . EOS% 02/17/2017 5.3  0.0 - 7.0 % Final  . BASO% 02/17/2017 0.8  0.0 - 2.0 % Final  . Sodium 02/17/2017 138  136 - 145 mEq/L Final  . Potassium 02/17/2017 4.2  3.5 - 5.1 mEq/L Final  . Chloride 02/17/2017 103  98 - 109 mEq/L Final  . CO2 02/17/2017 28  22 - 29 mEq/L Final  . Glucose 02/17/2017 91  70 - 140 mg/dl Final   Glucose reference range is for nonfasting patients. Fasting glucose reference range is 70- 100.  Marland Kitchen BUN 02/17/2017 7.1  7.0 - 26.0 mg/dL Final  . Creatinine 02/17/2017 0.9  0.7 - 1.3 mg/dL Final  . Total Bilirubin 02/17/2017 0.55  0.20 - 1.20 mg/dL Final  . Alkaline Phosphatase 02/17/2017 78  40 - 150 U/L Final  . AST 02/17/2017 26  5 - 34 U/L Final  . ALT 02/17/2017 27  0 - 55 U/L Final  . Total Protein 02/17/2017 8.1  6.4 - 8.3 g/dL Final  . Albumin 02/17/2017 3.5  3.5 - 5.0 g/dL Final  . Calcium 02/17/2017 9.6  8.4 - 10.4 mg/dL Final  . Anion Gap 02/17/2017 7  3 - 11 mEq/L Final  . EGFR 02/17/2017 >90  >90 ml/min/1.73 m2 Final   eGFR is calculated using the CKD-EPI Creatinine Equation (2009)  . LDH 02/17/2017 295* 125 - 245 U/L Final  . Angio Convert Enzyme 02/17/2017 83* 14 - 82 U/L Final  . Sedimentation Rate-Westergren 02/17/2017 29* 0 - 15 mm/hr Final  . CRP 02/17/2017 55.0* 0.0 - 4.9 mg/L Final       Ardath Sax, MD

## 2017-03-02 NOTE — Assessment & Plan Note (Signed)
36 y.o. male with presentation of protracted cough, shortness of breath with bilateral multifocal pulmonary nodules and mediastinal lymphadenopathy in the context of significant generalized symptoms including subjective fevers, night sweats, decreased appetite, early satiety, and significant weight loss.  Our additional assessment demonstrates no evidence of lymphadenopathy or other abnormal findings in the abdomen and pelvis. I worry demonstrates elevated inflammatory markers and elevation of ACE further supporting our previous suspicion for possible pulmonary sarcoidosis.at this time, patient plan for pulmonology assessment and will likely undergo bronchoscopy and possibly EBUS at that time.  Plan: --proceed with pulmonary assessment at discretion of pulmonology clinic --return to my clinic for follow-up two weeks after bronchoscopy.

## 2017-03-03 ENCOUNTER — Encounter (HOSPITAL_COMMUNITY): Payer: Self-pay

## 2017-03-03 NOTE — Anesthesia Preprocedure Evaluation (Addendum)
Anesthesia Evaluation  Patient identified by MRN, date of birth, ID band Patient awake    Reviewed: Allergy & Precautions, NPO status , Patient's Chart, lab work & pertinent test results  Airway Mallampati: III  TM Distance: >3 FB Neck ROM: Full    Dental no notable dental hx. (+) Poor Dentition   Pulmonary shortness of breath and with exertion, sleep apnea and Continuous Positive Airway Pressure Ventilation , pneumonia, former smoker,  Pulmonary infiltrates Sarcoidosis Pulmonary and mediastinal lymphadenopathy   Pulmonary exam normal breath sounds clear to auscultation       Cardiovascular negative cardio ROS Normal cardiovascular exam Rhythm:Regular Rate:Normal     Neuro/Psych  Headaches, PSYCHIATRIC DISORDERS Anxiety Depression    GI/Hepatic negative GI ROS, Neg liver ROS,   Endo/Other  Morbid obesityPre diabetes   Renal/GU negative Renal ROS  negative genitourinary   Musculoskeletal negative musculoskeletal ROS (+) Eczema   Abdominal (+) + obese,   Peds  Hematology xarelto- last dose 10/22   Anesthesia Other Findings   Reproductive/Obstetrics                           Anesthesia Physical Anesthesia Plan  ASA: III  Anesthesia Plan: General   Post-op Pain Management:    Induction: Intravenous  PONV Risk Score and Plan: 2 and Ondansetron, Propofol infusion, Midazolam and Promethazine  Airway Management Planned: Oral ETT  Additional Equipment:   Intra-op Plan: Utilization Of Total Body Hypothermia per surgeon request  Post-operative Plan: Extubation in OR  Informed Consent: I have reviewed the patients History and Physical, chart, labs and discussed the procedure including the risks, benefits and alternatives for the proposed anesthesia with the patient or authorized representative who has indicated his/her understanding and acceptance.   Dental advisory given  Plan  Discussed with: CRNA, Anesthesiologist and Surgeon  Anesthesia Plan Comments:        Anesthesia Quick Evaluation

## 2017-03-04 ENCOUNTER — Ambulatory Visit (HOSPITAL_COMMUNITY): Payer: Self-pay | Admitting: Certified Registered Nurse Anesthetist

## 2017-03-04 ENCOUNTER — Encounter (HOSPITAL_COMMUNITY)
Admission: RE | Disposition: A | Discharge status: Home / Self Care | Payer: Self-pay | Source: Ambulatory Visit | Attending: Emergency Medicine

## 2017-03-04 ENCOUNTER — Ambulatory Visit (HOSPITAL_COMMUNITY)
Admission: RE | Admit: 2017-03-04 | Discharge: 2017-03-04 | Disposition: A | Discharge status: Home / Self Care | Payer: Self-pay | Source: Ambulatory Visit | Attending: Emergency Medicine | Admitting: Emergency Medicine

## 2017-03-04 ENCOUNTER — Encounter (HOSPITAL_COMMUNITY): Payer: Self-pay

## 2017-03-04 DIAGNOSIS — I898 Other specified noninfective disorders of lymphatic vessels and lymph nodes: Secondary | ICD-10-CM | POA: Insufficient documentation

## 2017-03-04 DIAGNOSIS — G4733 Obstructive sleep apnea (adult) (pediatric): Secondary | ICD-10-CM | POA: Insufficient documentation

## 2017-03-04 DIAGNOSIS — Z87891 Personal history of nicotine dependence: Secondary | ICD-10-CM | POA: Insufficient documentation

## 2017-03-04 DIAGNOSIS — Z791 Long term (current) use of non-steroidal anti-inflammatories (NSAID): Secondary | ICD-10-CM | POA: Insufficient documentation

## 2017-03-04 DIAGNOSIS — Z79899 Other long term (current) drug therapy: Secondary | ICD-10-CM | POA: Insufficient documentation

## 2017-03-04 DIAGNOSIS — R918 Other nonspecific abnormal finding of lung field: Secondary | ICD-10-CM | POA: Diagnosis present

## 2017-03-04 DIAGNOSIS — Z9989 Dependence on other enabling machines and devices: Secondary | ICD-10-CM | POA: Insufficient documentation

## 2017-03-04 DIAGNOSIS — R590 Localized enlarged lymph nodes: Secondary | ICD-10-CM | POA: Diagnosis present

## 2017-03-04 DIAGNOSIS — J9811 Atelectasis: Secondary | ICD-10-CM | POA: Insufficient documentation

## 2017-03-04 DIAGNOSIS — Z6841 Body Mass Index (BMI) 40.0 and over, adult: Secondary | ICD-10-CM | POA: Insufficient documentation

## 2017-03-04 DIAGNOSIS — Z79891 Long term (current) use of opiate analgesic: Secondary | ICD-10-CM | POA: Insufficient documentation

## 2017-03-04 HISTORY — DX: Prediabetes: R73.03

## 2017-03-04 HISTORY — DX: Depression, unspecified: F32.A

## 2017-03-04 HISTORY — DX: Dyspnea, unspecified: R06.00

## 2017-03-04 HISTORY — PX: ENDOBRONCHIAL ULTRASOUND: SHX5096

## 2017-03-04 HISTORY — DX: Headache, unspecified: R51.9

## 2017-03-04 HISTORY — DX: Sleep apnea, unspecified: G47.30

## 2017-03-04 HISTORY — DX: Pneumonia, unspecified organism: J18.9

## 2017-03-04 HISTORY — DX: Anxiety disorder, unspecified: F41.9

## 2017-03-04 HISTORY — DX: Headache: R51

## 2017-03-04 HISTORY — DX: Major depressive disorder, single episode, unspecified: F32.9

## 2017-03-04 LAB — BODY FLUID CELL COUNT WITH DIFFERENTIAL
Lymphs, Fluid: 12 %
Monocyte-Macrophage-Serous Fluid: 12 % — ABNORMAL LOW (ref 50–90)
Neutrophil Count, Fluid: 76 % — ABNORMAL HIGH (ref 0–25)
Total Nucleated Cell Count, Fluid: 520 cu mm (ref 0–1000)

## 2017-03-04 SURGERY — ENDOBRONCHIAL ULTRASOUND (EBUS)
Anesthesia: General | Laterality: Bilateral

## 2017-03-04 MED ORDER — MIDAZOLAM HCL 2 MG/2ML IJ SOLN
INTRAMUSCULAR | Status: AC
  Filled 2017-03-04: qty 2

## 2017-03-04 MED ORDER — ONDANSETRON HCL 4 MG/2ML IJ SOLN
INTRAMUSCULAR | Status: DC | PRN
  Administered 2017-03-04: 4 mg via INTRAVENOUS

## 2017-03-04 MED ORDER — ESMOLOL HCL 100 MG/10ML IV SOLN
INTRAVENOUS | Status: DC | PRN
  Administered 2017-03-04 (×3): 20 mg via INTRAVENOUS

## 2017-03-04 MED ORDER — KETAMINE HCL 10 MG/ML IJ SOLN
INTRAMUSCULAR | Status: AC
  Filled 2017-03-04: qty 1

## 2017-03-04 MED ORDER — FENTANYL CITRATE (PF) 100 MCG/2ML IJ SOLN
INTRAMUSCULAR | Status: DC | PRN
  Administered 2017-03-04: 50 ug via INTRAVENOUS

## 2017-03-04 MED ORDER — SUCCINYLCHOLINE CHLORIDE 200 MG/10ML IV SOSY
PREFILLED_SYRINGE | INTRAVENOUS | Status: DC | PRN
  Administered 2017-03-04: 180 mg via INTRAVENOUS

## 2017-03-04 MED ORDER — ONDANSETRON HCL 4 MG/2ML IJ SOLN
INTRAMUSCULAR | Status: AC
  Filled 2017-03-04: qty 2

## 2017-03-04 MED ORDER — DEXAMETHASONE SODIUM PHOSPHATE 10 MG/ML IJ SOLN
INTRAMUSCULAR | Status: DC | PRN
  Administered 2017-03-04: 10 mg via INTRAVENOUS

## 2017-03-04 MED ORDER — LIDOCAINE 2% (20 MG/ML) 5 ML SYRINGE
INTRAMUSCULAR | Status: DC | PRN
  Administered 2017-03-04: 100 mg via INTRAVENOUS

## 2017-03-04 MED ORDER — MIDAZOLAM HCL 5 MG/5ML IJ SOLN
INTRAMUSCULAR | Status: DC | PRN
  Administered 2017-03-04: 1 mg via INTRAVENOUS

## 2017-03-04 MED ORDER — PROPOFOL 10 MG/ML IV BOLUS
INTRAVENOUS | Status: DC | PRN
  Administered 2017-03-04: 300 mg via INTRAVENOUS

## 2017-03-04 MED ORDER — LACTATED RINGERS IV SOLN
INTRAVENOUS | Status: DC
  Administered 2017-03-04 (×2): via INTRAVENOUS

## 2017-03-04 MED ORDER — DEXAMETHASONE SODIUM PHOSPHATE 10 MG/ML IJ SOLN
INTRAMUSCULAR | Status: AC
  Filled 2017-03-04: qty 1

## 2017-03-04 MED ORDER — LIDOCAINE 2% (20 MG/ML) 5 ML SYRINGE
INTRAMUSCULAR | Status: AC
  Filled 2017-03-04: qty 5

## 2017-03-04 MED ORDER — ROCURONIUM BROMIDE 50 MG/5ML IV SOSY
PREFILLED_SYRINGE | INTRAVENOUS | Status: DC | PRN
  Administered 2017-03-04: 10 mg via INTRAVENOUS
  Administered 2017-03-04: 40 mg via INTRAVENOUS

## 2017-03-04 MED ORDER — SUGAMMADEX SODIUM 200 MG/2ML IV SOLN
INTRAVENOUS | Status: DC | PRN
  Administered 2017-03-04: 500 mg via INTRAVENOUS

## 2017-03-04 MED ORDER — PROPOFOL 10 MG/ML IV BOLUS
INTRAVENOUS | Status: AC
  Filled 2017-03-04: qty 40

## 2017-03-04 MED ORDER — FENTANYL CITRATE (PF) 100 MCG/2ML IJ SOLN
INTRAMUSCULAR | Status: AC
  Filled 2017-03-04: qty 2

## 2017-03-04 NOTE — Progress Notes (Signed)
Pt up to chair to sit up per patient request. SAT up in chair varying from mid 80's to upper 90's pt denying trouble breath. Pt states when moving at home he gets SOB. Dr. Malen Gauze made aware that of above. That SATs going from mid 80's to upper 90's while sitting up in chair. Dr. Malen Gauze is fine with this and feels this is how patient does at home. Reminded patient that Dr. Delton Coombes would like him to wear his CPAP at home today while he is resting on sofa or bed. Pt to be discharged to home.

## 2017-03-04 NOTE — Anesthesia Postprocedure Evaluation (Signed)
Anesthesia Post Note  Patient: Anthony [name]  Procedure(s) Performed: ENDOBRONCHIAL ULTRASOUND (Bilateral )     Patient location during evaluation: PACU Anesthesia Type: General Level of consciousness: awake and alert and oriented Pain management: pain level controlled Vital Signs Assessment: post-procedure vital signs reviewed and stable Respiratory status: spontaneous breathing, nonlabored ventilation and respiratory function stable Cardiovascular status: blood pressure returned to baseline and stable Postop Assessment: no apparent nausea or vomiting Anesthetic complications: no    Last Vitals:  Vitals:   03/04/17 0920 03/04/17 0925  BP: (!) 158/82   Pulse: 84 79  Resp: 11 16  Temp: 36.8 C   SpO2: 99% 100%    Last Pain:  Vitals:   03/04/17 0920  TempSrc: Oral                 Brinsley Wence A.

## 2017-03-04 NOTE — H&P (View-Only) (Signed)
 Subjective:    Patient ID: Anthony [name], male    DOB: 09/23/1980, 36 y.o.   MRN: [id number]  HPI  36-year-old overweight gentleman with a minimal tobacco history, OSA on CPAP, who is referred from Dr Perlov for evaluation of bilateral nodular infiltrates and mediastinal/hilar lymphadenopathy. He was experiencing SOB which was treated 12/2016 as possible pneumonia. The SOB persisted and he developed cough, ultimately a CT chest was done on 02/05/17 that I have reviewed. This shows lateral nodular infiltrates, central in nature, also with significant mediastinal and hilar lymphadenopathy. He was seen by Dr. Perlov with oncology to further evaluate. Referred now to pursue tissue diagnosis.   He has been experiencing subjective fevers, sweats and decreased appetite. He has exertional dyspnea, especially with stairs. He had some eye swelling that has improved.   Labs showed elevated LDH at 295, ESR 29, ACE level 83, c-RP 55 (markedly elevated).   Review of Systems  Constitutional: Positive for appetite change and unexpected weight change. Negative for fever.  HENT: Positive for congestion and dental problem. Negative for ear pain, nosebleeds, postnasal drip, rhinorrhea, sinus pressure, sneezing, sore throat and trouble swallowing.   Eyes: Negative for redness and itching.  Respiratory: Positive for cough and shortness of breath. Negative for chest tightness and wheezing.   Cardiovascular: Positive for chest pain. Negative for palpitations and leg swelling.  Gastrointestinal: Positive for abdominal pain. Negative for nausea and vomiting.  Genitourinary: Negative for dysuria.  Musculoskeletal: Negative for joint swelling.  Skin: Negative for rash.  Neurological: Positive for headaches.  Hematological: Does not bruise/bleed easily.  Psychiatric/Behavioral: Positive for dysphoric mood. The patient is nervous/anxious.    Past Medical History:  Diagnosis Date  . Eczema of hand   . Obese       Family History  Problem Relation Age of Onset  . Hypertension Father   . Diabetes Father   . Stroke Father   . Lymphoma Paternal Uncle   . Ovarian cancer Maternal Grandmother 60     Social History   Social History  . Marital status: Married    Spouse name: N/A  . Number of children: N/A  . Years of education: N/A   Occupational History  . Disabled    Social History Main Topics  . Smoking status: Former Smoker    Packs/day: 0.30    Years: 10.00    Types: Cigarettes    Quit date: 09/30/2016  . Smokeless tobacco: Never Used  . Alcohol use No  . Drug use: No  . Sexual activity: Yes    Birth control/ protection: Condom   Other Topics Concern  . Not on file   Social History Narrative  . No narrative on file  He has worked as a musician, some custodial work No military [place] native.   No Known Allergies   Outpatient Medications Prior to Visit  Medication Sig Dispense Refill  . albuterol (PROVENTIL HFA;VENTOLIN HFA) 108 (90 Base) MCG/ACT inhaler Inhale 2 puffs into the lungs every 4 (four) hours as needed for wheezing or shortness of breath (cough, shortness of breath or wheezing.). 1 Inhaler 1  . oxyCODONE-acetaminophen (PERCOCET) 10-325 MG per tablet Take 0.5-1 tablets by mouth 3 (three) times daily as needed for pain. pain    . amoxicillin-clavulanate (AUGMENTIN) 875-125 MG tablet Take 1 tablet by mouth 2 (two) times daily. 20 tablet 0  . azithromycin (ZITHROMAX) 250 MG tablet Take 2 tabs PO x 1 dose, then 1 tab PO QD x 4 days   6 tablet 0  . ciprofloxacin (CILOXAN) 0.3 % ophthalmic solution Place 1 drop into both eyes every 4 (four) hours while awake. 5 mL 0  . hydrocortisone 2.5 % lotion Apply topically 2 (two) times daily. 59 mL 0  . methocarbamol (ROBAXIN) 500 MG tablet Take 1 tablet (500 mg total) by mouth 2 (two) times daily. 20 tablet 0  . Rivaroxaban 15 & 20 MG TBPK Take as directed on package 51 each 0   No facility-administered medications prior to visit.          Objective:   Physical Exam Vitals:   02/28/17 1428 02/28/17 1429  BP:  132/80  Pulse:  88  SpO2:  96%  Weight: (!) 504 lb (228.6 kg)   Height: 5' 8" (1.727 m)    Gen: Pleasant, obese man, in no distress,  normal affect  ENT: No lesions,  mouth clear,  oropharynx clear, no postnasal drip  Neck: No JVD, no TMG, no carotid bruits  Lungs: No use of accessory muscles, clear without rales or rhonchi  Cardiovascular: RRR, heart sounds normal, no murmur or gallops, no peripheral edema  Musculoskeletal: No deformities, no cyanosis or clubbing  Neuro: alert, non focal  Skin: Warm, no lesions or rashes    02/05/17 --  COMPARISON:  Chest radiograph January 17, 2017  FINDINGS: Cardiovascular: There is no appreciable thoracic aortic aneurysm. Visualized great vessels appear unremarkable on this noncontrast enhanced study. Pericardium is not appreciably thickened.  Mediastinum/Nodes: Visualized thyroid appears unremarkable. No esophageal lesions are evident.  There is extensive adenopathy throughout the thoracic region. There is a lymph node in the aortopulmonary window region measuring 4.0 x 2.8 cm. There are several right paratracheal and pretracheal lymph nodes, largest measuring 2.8 x 2.8 cm. There are multiple enlarged hilar lymph nodes, largest measuring 3.1 x 2.9 cm. There is left hilar adenopathy with the largest lymph node appreciable in the left hilum measuring 3.4 x 2.9 cm. There is sub- carinal adenopathy with the largest sub- carinal lymph node appreciable measuring 2.9 x 2.7 cm.  Lungs/Pleura: There is a masslike area in the anterior segment of the left upper lobe near the apex measuring 2.6 x 2.0 x 2.1 cm. This area has irregular margins. There is a somewhat spiculated nodular opacity abutting the major fissure in the posterior segment of the left upper lobe measuring 1.9 x 1.5 x 1.5 cm. A nearby nodular opacity in this same area measures 1.3 x  1.0 x 1.0 cm. There are confluent opacities in the right perihilar region with involvement in portions of the right middle and lower lobes at several sites. These areas appear more infiltrative and nodular all the right, although there is a somewhat irregular opacity in the medial segment of the right lower lobe measuring 3.2 x 3.0 x 2.2 cm. There is a nodular opacity in the inferior lingula measuring 1.5 x 1.0 x 1.0 cm. There is a 5 mm nodular opacity in the anterior segment right upper lobe seen on axial slice 30 series 8. There is no pleural effusion or pleural thickening evident.  Upper Abdomen: Liver appears prominent but incompletely visualized. Visualized spleen appears normal. Visualized upper abdominal structures otherwise appear unremarkable.  Musculoskeletal: There is degenerative change in the thoracic spine. There are no blastic or lytic bone lesions.  IMPRESSION: 1. Multiple irregular nodular appearing opacities on the left, concerning for multifocal neoplasia.  2. Areas of confluent opacity at multiple sites on the right, most notably in the right middle   and lower lobes. These areas appear more infiltrative than neoplastic, although neoplasm with superimposed pneumonitis is certainly possible in one or more of these areas.  3. Extensive multifocal adenopathy throughout the hila and mediastinal regions. This appearance heightened suspicion for underlying neoplasm.  4. Given the changes in the lungs, correlation with bronchoscopy may well be warranted. Nuclear medicine PET study also could be helpful to assess for degree of metabolic activity in areas of abnormal lung parenchyma and enlarged lymph nodes.      Assessment & Plan:  Abnormal chest x-ray with multiple lung nodules CT scan of the chest performed in the setting of cough, dyspnea with bilateral patchy nodular infiltrates, mediastinal lymphadenopathy. Elevated Ace level suggestive of sarcoidosis.  Agree that he needs nodal and lung biopsies performed. Best strategy would be endobronchial ultrasound. We will work on arranging this as soon as possible.  Haniyah Maciolek, MD, PhD 02/28/2017, 3:00 PM [facility] [phone] or if no answer [phone]  

## 2017-03-04 NOTE — Interval H&P Note (Signed)
History and Physical Interval Note:  03/04/2017 7:35 AM  Anthony [name]  has presented today for surgery, with the diagnosis of LUNG MASS  The various methods of treatment have been discussed with the patient and family. After consideration of risks, benefits and other options for treatment, the patient has consented to  Procedure(s): ENDOBRONCHIAL ULTRASOUND (Bilateral) as a surgical intervention .  The patient's history has been reviewed, patient examined, no change in status, stable for surgery.  I have reviewed the patient's chart and labs.  Questions were answered to the patient's satisfaction.     Brandelyn Henne S.

## 2017-03-04 NOTE — Anesthesia Procedure Notes (Signed)
Procedure Name: Intubation Date/Time: 03/04/2017 8:09 AM Performed by: Wynonia Sours Pre-anesthesia Checklist: Patient identified, Emergency Drugs available, Suction available, Patient being monitored and Timeout performed Patient Re-evaluated:Patient Re-evaluated prior to induction Oxygen Delivery Method: Circle system utilized Preoxygenation: Pre-oxygenation with 100% oxygen Induction Type: IV induction Ventilation: Two handed mask ventilation required Laryngoscope Size: Glidescope and 4 Grade View: Grade I Tube type: Oral Tube size: 9.0 mm Number of attempts: 1 Airway Equipment and Method: Stylet,  Video-laryngoscopy and Patient positioned with wedge pillow Placement Confirmation: ETT inserted through vocal cords under direct vision,  positive ETCO2,  CO2 detector and breath sounds checked- equal and bilateral Secured at: 22 cm Tube secured with: Tape Dental Injury: Teeth and Oropharynx as per pre-operative assessment  Comments: Elective use of glidescope Lo Pro #4 utilized. Patient pre-oxgenated and on ramp pillow.

## 2017-03-04 NOTE — Discharge Instructions (Signed)
Flexible Bronchoscopy, Care After These instructions give you information on caring for yourself after your procedure. Your doctor may also give you more specific instructions. Call your doctor if you have any problems or questions after your procedure. Follow these instructions at home:  Do not eat or drink anything for 2 hours after your procedure. If you try to eat or drink before the medicine wears off, food or drink could go into your lungs. You could also burn yourself.  After 2 hours have passed and when you can cough and gag normally, you may eat soft food and drink liquids slowly.  The day after the test, you may eat your normal diet.  You may do your normal activities.  Keep all doctor visits. Get help right away if:  You get more and more short of breath.  You get light-headed.  You feel like you are going to pass out (faint).  You have chest pain.  You have new problems that worry you.  You cough up more than a little blood.  You cough up more blood than before. This information is not intended to replace advice given to you by your health care provider. Make sure you discuss any questions you have with your health care provider. Document Released: 02/24/2009 Document Revised: 10/05/2015 Document Reviewed: 01/01/2013 Elsevier Interactive Patient Education  2017 [place].  Please call our office for any questions or concerns.  [phone].

## 2017-03-04 NOTE — Transfer of Care (Signed)
Immediate Anesthesia Transfer of Care Note  Patient: Anthony [name]  Procedure(s) Performed: Procedure(s): ENDOBRONCHIAL ULTRASOUND (Bilateral)  Patient Location: PACU  Anesthesia Type:General  Level of Consciousness:  sedated, patient cooperative and responds to stimulation  Airway & Oxygen Therapy:Patient Spontanous Breathing and Patient connected to face mask oxgen  Post-op Assessment:  Report given to PACU RN and Post -op Vital signs reviewed and stable  Post vital signs:  Reviewed and stable  Last Vitals:  Vitals:   03/04/17 0655 03/04/17 0920  BP: (!) 150/69   Pulse: 73 84  Resp: 10 11  Temp: 37 C 36.8 C  SpO2: 98% 99%    Complications: No apparent anesthesia complications

## 2017-03-04 NOTE — Progress Notes (Signed)
Pt with inspiratory and some expiratory wheezes . O2 SAt 100% on face mask with 15L. Pt awake and states he is not having any trouble breathing. After about 1-2 min. In recovery wheezes gone. Dr. Malen Gauze to see pt and made aware of wheezing at times but then going away. Dr. Malen Gauze stated that may give an albuterol treatment if pt feels like he is having trouble breathing.  Pt denies trouble at this time. Encouraged to cough and deep breath. Dr. Ortencia Kick by to talk to patient. No CXR needed at this time per Dr. Ortencia Kick.

## 2017-03-04 NOTE — Op Note (Signed)
[facility] Cardiopulmonary Patient Name: Anthony [name] Procedure Date: 03/04/2017 MRN: [id number] Attending MD: Collene Gobble , MD Date of Birth: 01/28/1981 CSN: [id number] Age: 36 Admit Type: Outpatient Ethnicity: Not Hispanic or Latino Procedure:            Bronchoscopy Indications:          Bilateral atelectasis, Bilateral hilar lymphadenopathy,                        Mediastinal adenopathy Providers:            Collene Gobble, MD, Elmer Ramp. Tilden Dome, RN, Ashley Mariner                        RRT,RCP Referring MD:          Medicines:            General Anesthesia Complications:        No immediate complications Estimated Blood Loss: Estimated blood loss was minimal. Procedure:      Pre-Anesthesia Assessment:      - A History and Physical has been performed. Patient meds and allergies       have been reviewed. The risks and benefits of the procedure and the       sedation options and risks were discussed with the patient. All       questions were answered and informed consent was obtained. Patient       identification and proposed procedure were verified prior to the       procedure by the physician in the pre-procedure area. Mental Status       Examination: normal. Airway Examination: normal oropharyngeal airway.       Respiratory Examination: clear to auscultation. CV Examination: RRR, no       murmurs, no S3 or S4. ASA Grade Assessment: II - A patient with mild       systemic disease. After reviewing the risks and benefits, the patient       was deemed in satisfactory condition to undergo the procedure. The       anesthesia plan was to use general anesthesia. Immediately prior to       administration of medications, the patient was re-assessed for adequacy       to receive sedatives. The heart rate, respiratory rate, oxygen       saturations, blood pressure, adequacy of pulmonary ventilation, and       response to care were monitored throughout the procedure.  The physical       status of the patient was re-assessed after the procedure.      After obtaining informed consent, the bronchoscope was passed under       direct vision. Throughout the procedure, the patient's blood pressure,       pulse, and oxygen saturations were monitored continuously. the [id number]       ( [id number]) scope was introduced through the mouth, via the endotracheal       tube and advanced to the tracheobronchial tree. The procedure was       accomplished without difficulty. The patient tolerated the procedure       well. Findings:      The endotracheal tube is in good position. The visualized portion of the       trachea is of normal caliber. The carina is sharp. The tracheobronchial       tree was examined to  at least the first subsegmental level. Bronchial       mucosa and anatomy were difusely edematous and crowded; there are no       endobronchial lesions, and no secretions.      Endobronchial ultrasound was used assist with fine needle aspiration in       the entire tracheobronchial tree. There was generalized lymphadenopathy       in all stations. Transbronchial Wang needle biopsies were performed at       stations 11L, 10R, 7 and sent for cytology.      Rapid On-Site Evaluation (ROSE): Preliminary cytology of the lesions in       the subcarinal area and in the right hilum was suggestive of       non-necrotic granulomatous tissue (final results are pending). Impression:      - Bilateral atelectasis      - Bilateral hilar lymphadenopathy      - Mediastinal adenopathy      - The examination was normal with exception of edematous airways. There       was widespread bulky mediastinal and hilar lymphadenopathy.      - Endobronchial ultrasound was performed. Needle biopsies at 11L, 10R, 7.      - Rapid On-Site Evaluation (ROSE): Preliminary cytology of the lesion in       the subcarinal area and in the right hilum was suggestive of       non-necrotic granulomatous  tissue (final results are pending).      - BAL performed RLL Superior Segment for cytology and microbiology. Moderate Sedation:      Performed under general anesthesia Recommendation:      - Await BAL and cytology results. Procedure Code(s):      --- Professional ---      [phone], Bronchoscopy, rigid or flexible, including fluoroscopic guidance,       when performed; diagnostic, with cell washing, when performed (separate       procedure)      31654, Bronchoscopy, rigid or flexible, including fluoroscopic guidance,       when performed; with transendoscopic endobronchial ultrasound (EBUS)       during bronchoscopic diagnostic or therapeutic intervention(s) for       peripheral lesion(s) (List separately in addition to code for primary       procedure[s]) Diagnosis Code(s):      --- Professional ---      J98.11, Atelectasis      R59.9, Enlarged lymph nodes, unspecified CPT copyright 2016 American Medical Association. All rights reserved. The codes documented in this report are preliminary and upon coder review may  be revised to meet current compliance requirements. Collene Gobble, MD Collene Gobble, MD 03/04/2017 9:13:32 AM Number of Addenda: 0 Scope In: Scope Out:

## 2017-03-05 ENCOUNTER — Encounter (HOSPITAL_COMMUNITY): Payer: Self-pay | Admitting: Emergency Medicine

## 2017-03-05 LAB — ACID FAST SMEAR (AFB, MYCOBACTERIA): Acid Fast Smear: NEGATIVE

## 2017-03-06 ENCOUNTER — Telehealth: Payer: Self-pay | Admitting: Emergency Medicine

## 2017-03-06 LAB — CULTURE, BAL-QUANTITATIVE
CULTURE: NORMAL — AB
SPECIAL REQUESTS: NORMAL

## 2017-03-06 LAB — CULTURE, BAL-QUANTITATIVE W GRAM STAIN

## 2017-03-06 NOTE — Telephone Encounter (Signed)
Patient called back on the status of this message

## 2017-03-06 NOTE — Telephone Encounter (Signed)
Spoke with pt, he states he has knee pain in his legs and walks with a cane. After his procedure on Tuesday he was walking a little better and then the next day he didn't need his cane at all. He wants to know if RB gave him something to help with this because he has not been able to walk like this in 5 years. He wants this to continue and wants to know what he was given during the procedure. RB please advise.

## 2017-03-06 NOTE — Telephone Encounter (Signed)
Pt is aware of below message and voiced his understanding. Nothing further needed.  

## 2017-03-06 NOTE — Telephone Encounter (Signed)
Let him know that I did not give him anything new for the procedure.  He did have general anesthesia managed by the anesthesiologists.  It is possible that they may have given him something anti-inflammatory although I do not have any record of this.  We can try to investigate by looking at the anesthesia notes.

## 2017-03-17 ENCOUNTER — Telehealth: Payer: Self-pay | Admitting: Hematology and Oncology

## 2017-03-17 ENCOUNTER — Ambulatory Visit: Payer: Self-pay | Admitting: Family Medicine

## 2017-03-17 ENCOUNTER — Ambulatory Visit (HOSPITAL_BASED_OUTPATIENT_CLINIC_OR_DEPARTMENT_OTHER): Payer: Self-pay | Admitting: Hematology and Oncology

## 2017-03-17 ENCOUNTER — Other Ambulatory Visit: Payer: Self-pay

## 2017-03-17 ENCOUNTER — Encounter: Payer: Self-pay | Admitting: Hematology and Oncology

## 2017-03-17 ENCOUNTER — Ambulatory Visit: Payer: Self-pay

## 2017-03-17 VITALS — BP 126/52 | HR 72 | Temp 98.8°F | Resp 20 | Ht 68.0 in | Wt >= 6400 oz

## 2017-03-17 DIAGNOSIS — D869 Sarcoidosis, unspecified: Secondary | ICD-10-CM

## 2017-03-17 DIAGNOSIS — D862 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: Secondary | ICD-10-CM

## 2017-03-17 NOTE — Telephone Encounter (Signed)
Scheduled appt per 11/5 los - Gave patient AVS and calender los. - central radiology to contact patient with scan appt.

## 2017-03-21 LAB — QUANTIFERON-TB GOLD PLUS
QUANTIFERON NIL VALUE: 0.1 [IU]/mL
QUANTIFERON TB1 AG VALUE: 0.1 [IU]/mL
QUANTIFERON-TB GOLD PLUS: NEGATIVE
QuantiFERON TB2 Ag Value: 0.1 IU/mL

## 2017-03-27 NOTE — Progress Notes (Signed)
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 male with presentation of protracted cough, shortness of breath with bilateral multifocal pulmonary nodules and mediastinal lymphadenopathy in the context of significant generalized symptoms including subjective fevers, night sweats, decreased appetite, early satiety, and significant weight loss.  Testing demonstrated elevation of ACE enzyme.  Subsequently, patient underwent bronchoscopy confirming diagnosis of pulmonary/mediastinal lymph node sarcoidosis.  Initial testing for possible presence of mycobacteria is negative.  Will complete if evaluation for tuberculosis by obtaining QuantiFERON gold.  Additional imaging with bone scan to assess for possible skeletal involvement with sarcoidosis.  Plan: --Bone scan --Quantiferon testing, if negative, start prednisone 20-40 mg/day. --Patient does have appointment scheduled with Dr. Delton Coombes who will take over prednisone management --Return to my clinic in 2 weeks  Voice recognition software was used and creation of this note. Despite my best effort at editing the text, some misspelling/errors may have occurred.   Orders Placed This Encounter  Procedures  . NM Bone Scan Whole Body    Standing Status:   Future    Standing Expiration Date:   03/17/2018    Order Specific Question:   If indicated for the ordered procedure, I authorize the administration of a radiopharmaceutical per Radiology protocol    Answer:   Yes    Order Specific Question:   Preferred imaging location?    Answer:   [facility]    Order Specific Question:   Radiology Contrast Protocol - do NOT remove file path    Answer:   [email or web address]  . QuantiFERON-TB Gold Plus   All questions were answered. . The patient knows to call the clinic with any problems, questions or concerns.  This note was electronically  signed.    History of Presenting Illness Anthony [name] is a 36 y.o. male followed in the Cancer Center for finding of Pulmonary nodules and mediastinal lymphadenopathy. Patient was diagnosed with pneumonia with protracted period of cough which has started approximately 6 weeks ago. At the end of August, patient was diagnosed with pneumonia and treated with antibiotics without significant relief. Due to elevated d-dimer and inability to obtain lung imaging due to patient's habitus patient was also started on her Rivaroxaban (Xarelto) for suspected pulmonary embolus. Patient continues to have cough to the point off intolerable feeds, associated with shortness of breath. He has developed discomfort in the left upper quadrant/left lower ribs. Presently, having fevers, chills, 2 episodes of drain teaching night sweats patient reports significant loss of appetite for the past 4 weeks associated with early satiety and weight loss of approximately 40 pounds. No constipation, one episode of watery diarrhea without blood or melena. Has been having generalized body aches and muscle discomfort since Saturday.  He was seen in the emergency room on 02/05/17 and CT scan of the chest was obtained due to unremitting cough. The CT scan demonstrates extensive lymphadenopathy throughout the mediastinum measuring up to 4.0 cm as well as at least for pulmonary lesions including 2.6 cm lesion in the left upper lobe, 1.9 cm left upper lobe lesion, 3.2 cm right lower lobe lesion, and 1.5 cm lesion in the lingula. No reported adrenal lesions at that time. Patient returns to the clinic today to review results of additional evaluation. He denies a new complaint since last visit to the clinic.  Patient returns to the clinic after undergoing pulmonary evaluation which confirmed presence of sarcoidosispatient underwent bronchoscopy by Dr. Delton Coombes on  03/04/17 demonstrating edematous and crowded mucosa, no endobronchial lesions and no  malignant cells.  Biopsy demonstrates no malignancy and granulomatous noncaseating inflammation in the lymph nodes.  CT scan of abdomen and pelvis obtained on 02/21/17 demonstrates no abnormal findings in the abdomen and pelvis to suggest involvement with disease.  Medical History: Past Medical History:  Diagnosis Date  . Anxiety   . Depression   . Dyspnea   . Eczema of hand   . Headache    hx of with tooth pain  . Obese   . Pneumonia    12/2016  . Pre-diabetes   . Sleep apnea    cpap    Surgical History: Past Surgical History:  Procedure Laterality Date  . ARTHROSCOPY KNEE W/ DRILLING     right knee   . ENDOBRONCHIAL ULTRASOUND Bilateral 03/04/2017   Procedure: ENDOBRONCHIAL ULTRASOUND;  Surgeon: Leslye Peer, MD;  Location: [facility] ENDOSCOPY;  Service: Cardiopulmonary;  Laterality: Bilateral;  . left knee inner growth plate removed     to straighten leg    Family History: Family History  Problem Relation Age of Onset  . Hypertension Father   . Diabetes Father   . Stroke Father   . Lymphoma Paternal Uncle   . Ovarian cancer Maternal Grandmother 78    Social History: Social History   Socioeconomic History  . Marital status: Married    Spouse name: Not on file  . Number of children: Not on file  . Years of education: Not on file  . Highest education level: Not on file  Social Needs  . Financial resource strain: Not on file  . Food insecurity - worry: Not on file  . Food insecurity - inability: Not on file  . Transportation needs - medical: Not on file  . Transportation needs - non-medical: Not on file  Occupational History  . Occupation: Disabled  Tobacco Use  . Smoking status: Former Smoker    Packs/day: 0.30    Years: 10.00    Pack years: 3.00    Types: Cigarettes    Last attempt to quit: 09/30/2016    Years since quitting: 0.4  . Smokeless tobacco: Never Used  Substance and Sexual Activity  . Alcohol use: No  . Drug use: No  . Sexual activity: Yes     Birth control/protection: Condom  Other Topics Concern  . Not on file  Social History Narrative  . Not on file    Allergies: Allergies  Allergen Reactions  . Xarelto [Rivaroxaban]     Bleeding gums, weakness, eye swelling/bluring vision, intensified joint pain    Medications:  Current Outpatient Medications  Medication Sig Dispense Refill  . albuterol (PROVENTIL HFA;VENTOLIN HFA) 108 (90 Base) MCG/ACT inhaler Inhale 2 puffs into the lungs every 4 (four) hours as needed for wheezing or shortness of breath (cough, shortness of breath or wheezing.). 1 Inhaler 1  . Menthol, Topical Analgesic, (ICY HOT EX) Apply 1 application topically daily as needed (muscle pain).    . naproxen (NAPROSYN) 500 MG tablet Take 500 mg by mouth 2 (two) times daily with a meal.    . naproxen sodium (ANAPROX) 220 MG tablet Take 220-440 mg by mouth 2 (two) times daily. Take 440 mg in the morning an 220 mg in the evening    . oxyCODONE-acetaminophen (PERCOCET) 10-325 MG per tablet Take 1 tablet by mouth 3 (three) times daily. pain    . Pseudoeph-Doxylamine-DM-APAP (NYQUIL PO) Take 1 Dose by mouth at bedtime as needed (cough).  No current facility-administered medications for this visit.     Review of Systems: Review of Systems  Constitutional: Positive for appetite change, chills, fatigue, fever and unexpected weight change.       Night sweats  Respiratory: Positive for cough.        Left upper quadrant/left lower rib discomfort. Dry cough without hemoptysis  Gastrointestinal: Positive for diarrhea. Negative for blood in stool, constipation and nausea.       Early satiety  Musculoskeletal: Positive for arthralgias and myalgias.     PHYSICAL EXAMINATION Blood pressure (!) 126/52, pulse 72, temperature 98.8 F (37.1 C), temperature source Oral, resp. rate 20, height 5\' 8"  (1.727 m), weight (!) 498 lb 3.2 oz (226 kg), SpO2 96 %.  ECOG PERFORMANCE STATUS: 2 - Symptomatic, <50% confined to  bed  Physical Exam  Constitutional: He is oriented to person, place, and time and well-developed, well-nourished, and in no distress. No distress.  HENT:  Head: Normocephalic and atraumatic.  Mouth/Throat: Oropharynx is clear and moist. No oropharyngeal exudate.  Eyes: Conjunctivae are normal. Pupils are equal, round, and reactive to light. Right eye exhibits no discharge. No scleral icterus.  Neck: No thyromegaly present.  Cardiovascular: Normal rate and regular rhythm.  No murmur heard. Heart sounds are distant  Pulmonary/Chest: Effort normal. He has no wheezes. He has no rales.  Abdominal: Soft. He exhibits no distension. There is no tenderness.  Obese, extent of obesity limits evaluation for possible hepatosplenomegaly or intra-abdominal mass lesions.  Musculoskeletal: He exhibits edema.  Lymphadenopathy:    He has no cervical adenopathy.  Neurological: He is alert and oriented to person, place, and time. He has normal reflexes. No cranial nerve deficit.  Skin: Skin is warm. No rash noted. He is diaphoretic. No erythema.  Skin of the palms appears to be thickened, fissures present without active bleeding at this time.     LABORATORY DATA: I have personally reviewed the data as listed: Office Visit on 03/17/2017  Component Date Value Ref Range Status  . QuantiFERON Incubation 03/17/2017 Incubation performed.   Final  . QuantiFERON Criteria 03/17/2017 Comment   Final   Comment: The QuantiFERON-TB Gold Plus result is determined by subtracting the Nil value from either TB antigen (Ag) tube. The mitogen tube serves as a control for the test.   . QuantiFERON TB1 Ag Value 03/17/2017 0.10  IU/mL Final  . QuantiFERON TB2 Ag Value 03/17/2017 0.10  IU/mL Final  . QuantiFERON Nil Value 03/17/2017 0.10  IU/mL Final  . QuantiFERON Mitogen Value 03/17/2017 >10.00  IU/mL Final  . QuantiFERON-TB Gold Plus 03/17/2017 Negative  Negative Final       Daisy Blossom, MD

## 2017-03-27 NOTE — Assessment & Plan Note (Signed)
36 y.o. male with presentation of protracted cough, shortness of breath with bilateral multifocal pulmonary nodules and mediastinal lymphadenopathy in the context of significant generalized symptoms including subjective fevers, night sweats, decreased appetite, early satiety, and significant weight loss.  Testing demonstrated elevation of ACE enzyme.  Subsequently, patient underwent bronchoscopy confirming diagnosis of pulmonary/mediastinal lymph node sarcoidosis.  Initial testing for possible presence of mycobacteria is negative.  Will complete if evaluation for tuberculosis by obtaining QuantiFERON gold.  Additional imaging with bone scan to assess for possible skeletal involvement with sarcoidosis.  Plan: --Bone scan --Quantiferon testing, if negative, start prednisone 20-40 mg/day. --Patient does have appointment scheduled with Dr. Delton Coombes who will take over prednisone management --Return to my clinic in 2 weeks

## 2017-03-31 ENCOUNTER — Encounter (HOSPITAL_COMMUNITY): Payer: Self-pay

## 2017-03-31 ENCOUNTER — Telehealth: Payer: Self-pay

## 2017-03-31 ENCOUNTER — Other Ambulatory Visit: Payer: Self-pay | Admitting: Hematology and Oncology

## 2017-03-31 ENCOUNTER — Ambulatory Visit (HOSPITAL_BASED_OUTPATIENT_CLINIC_OR_DEPARTMENT_OTHER): Payer: Self-pay | Admitting: Hematology and Oncology

## 2017-03-31 DIAGNOSIS — D869 Sarcoidosis, unspecified: Secondary | ICD-10-CM

## 2017-03-31 MED ORDER — PREDNISONE 20 MG PO TABS: 20.0000 mg | ORAL_TABLET | Freq: Every day | ORAL | 0 refills | Status: DC

## 2017-03-31 NOTE — Telephone Encounter (Signed)
Pt did not show for 9:20 appointment.  A message was left to check on pt and asked for him to call to reschedule appointment.

## 2017-04-02 ENCOUNTER — Telehealth: Payer: Self-pay | Admitting: Hematology and Oncology

## 2017-04-02 LAB — FUNGAL ORGANISM REFLEX

## 2017-04-02 LAB — FUNGUS CULTURE WITH STAIN

## 2017-04-02 LAB — FUNGUS CULTURE RESULT

## 2017-04-02 NOTE — Telephone Encounter (Signed)
MP PAL - moved 12/3 lab/fu to 12/4. Spoke with patient he is aware.

## 2017-04-10 ENCOUNTER — Ambulatory Visit (INDEPENDENT_AMBULATORY_CARE_PROVIDER_SITE_OTHER): Payer: Self-pay | Admitting: Emergency Medicine

## 2017-04-10 ENCOUNTER — Encounter: Payer: Self-pay | Admitting: Emergency Medicine

## 2017-04-10 DIAGNOSIS — K5900 Constipation, unspecified: Secondary | ICD-10-CM

## 2017-04-10 DIAGNOSIS — D862 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: Secondary | ICD-10-CM

## 2017-04-10 MED ORDER — PREDNISONE 10 MG PO TABS: 30.0000 mg | ORAL_TABLET | Freq: Every day | ORAL | 1 refills | Status: DC

## 2017-04-10 NOTE — Assessment & Plan Note (Signed)
Try using colace 100mg  twice a day Try using senna or bisacodyl over the counter as needed for constipation.

## 2017-04-10 NOTE — Assessment & Plan Note (Signed)
We will start prednisone 30mg  daily and plan to take for the next month.   Try to modify your carb intake while you are on this medication.  Follow your weight closely while on the prednisone.  We will repeat a CXR or CT scan of your chest  Follow with Dr in 1 month

## 2017-04-10 NOTE — Progress Notes (Signed)
Subjective:    Patient ID: Anthony [name], male    DOB: [birth year], 36 y.o.   MRN: [id number]  HPI  36 year old overweight gentleman with a minimal tobacco history, OSA on CPAP, who is referred from Dr Lebron Conners for evaluation of bilateral nodular infiltrates and mediastinal/hilar lymphadenopathy. He was experiencing SOB which was treated 12/2016 as possible pneumonia. The SOB persisted and he developed cough, ultimately a CT chest was done on 02/05/17 that I have reviewed. This shows lateral nodular infiltrates, central in nature, also with significant mediastinal and hilar lymphadenopathy. He was seen by Dr. Lebron Conners with oncology to further evaluate. Referred now to pursue tissue diagnosis.   He has been experiencing subjective fevers, sweats and decreased appetite. He has exertional dyspnea, especially with stairs. He had some eye swelling that has improved.   Labs showed elevated LDH at 295, ESR 29, ACE level 83, c-RP 55 (markedly elevated).   ROV 04/10/17 --follow-up visit for patient with a history of obstructive sleep apnea and bilateral nodular infiltrates with mediastinal lymphadenopathy.  He underwent endobronchial ultrasound and nodal biopsies on 03/04/17.  Nodal biopsies consistent with granulomatous inflammation.  AFB and fungal studies are negative. Quant gold negative.   Review of Systems  Constitutional: Positive for appetite change and unexpected weight change. Negative for fever.  HENT: Positive for congestion and dental problem. Negative for ear pain, nosebleeds, postnasal drip, rhinorrhea, sinus pressure, sneezing, sore throat and trouble swallowing.   Eyes: Negative for redness and itching.  Respiratory: Positive for cough and shortness of breath. Negative for chest tightness and wheezing.   Cardiovascular: Positive for chest pain. Negative for palpitations and leg swelling.  Gastrointestinal: Positive for abdominal pain. Negative for nausea and vomiting.  Genitourinary:  Negative for dysuria.  Musculoskeletal: Negative for joint swelling.  Skin: Negative for rash.  Neurological: Positive for headaches.  Hematological: Does not bruise/bleed easily.  Psychiatric/Behavioral: Positive for dysphoric mood. The patient is nervous/anxious.    Past Medical History:  Diagnosis Date  . Anxiety   . Depression   . Dyspnea   . Eczema of hand   . Headache    hx of with tooth pain  . Obese   . Pneumonia    12/2016  . Pre-diabetes   . Sleep apnea    cpap     Family History  Problem Relation Age of Onset  . Hypertension Father   . Diabetes Father   . Stroke Father   . Lymphoma Paternal Uncle   . Ovarian cancer Maternal Grandmother 79     Social History   Socioeconomic History  . Marital status: Married    Spouse name: Not on file  . Number of children: Not on file  . Years of education: Not on file  . Highest education level: Not on file  Social Needs  . Financial resource strain: Not on file  . Food insecurity - worry: Not on file  . Food insecurity - inability: Not on file  . Transportation needs - medical: Not on file  . Transportation needs - non-medical: Not on file  Occupational History  . Occupation: Disabled  Tobacco Use  . Smoking status: Former Smoker    Packs/day: 0.30    Years: 10.00    Pack years: 3.00    Types: Cigarettes    Last attempt to quit: 09/30/2016    Years since quitting: 0.5  . Smokeless tobacco: Never Used  Substance and Sexual Activity  . Alcohol use: No  . Drug use:  No  . Sexual activity: Yes    Birth control/protection: Condom  Other Topics Concern  . Not on file  Social History Narrative  . 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   Allergies  Allergen Reactions  . Xarelto [Rivaroxaban]     Bleeding gums, weakness, eye swelling/bluring vision, intensified joint pain     Outpatient Medications Prior to Visit  Medication Sig Dispense Refill  . albuterol  (PROVENTIL HFA;VENTOLIN HFA) 108 (90 Base) MCG/ACT inhaler Inhale 2 puffs into the lungs every 4 (four) hours as needed for wheezing or shortness of breath (cough, shortness of breath or wheezing.). 1 Inhaler 1  . Menthol, Topical Analgesic, (ICY HOT EX) Apply 1 application topically daily as needed (muscle pain).    . naproxen (NAPROSYN) 500 MG tablet Take 500 mg by mouth 2 (two) times daily with a meal.    . naproxen sodium (ANAPROX) 220 MG tablet Take 220-440 mg by mouth 2 (two) times daily. Take 440 mg in the morning an 220 mg in the evening    . oxyCODONE-acetaminophen (PERCOCET) 10-325 MG per tablet Take 1 tablet by mouth 3 (three) times daily. pain    . Pseudoeph-Doxylamine-DM-APAP (NYQUIL PO) Take 1 Dose by mouth at bedtime as needed (cough).    . predniSONE (DELTASONE) 20 MG tablet Take 1 tablet (20 mg total) daily with breakfast by mouth. (Patient not taking: Reported on 04/10/2017) 30 tablet 0   No facility-administered medications prior to visit.         Objective:   Physical Exam Vitals:   04/10/17 1127  BP: 128/66  Pulse: 61  SpO2: 99%  Weight: (!) 506 lb (229.5 kg)  Height: '5\' 8"'  (1.727 m)   Gen: Pleasant, obese man, in no distress,  normal affect  ENT: No lesions,  mouth clear,  oropharynx clear, no postnasal drip  Neck: No JVD, no TMG, no carotid bruits  Lungs: No use of accessory muscles, clear without rales or rhonchi  Cardiovascular: RRR, heart sounds normal, no murmur or gallops, no peripheral edema  Musculoskeletal: No deformities, no cyanosis or clubbing  Neuro: alert, non focal  Skin: Warm, no lesions or rashes    02/05/17 --  COMPARISON:  Chest radiograph January 17, 2017  FINDINGS: Cardiovascular: There is no appreciable thoracic aortic aneurysm. Visualized great vessels appear unremarkable on this noncontrast enhanced study. Pericardium is not appreciably thickened.  Mediastinum/Nodes: Visualized thyroid appears unremarkable.  No esophageal lesions are evident.  There is extensive adenopathy throughout the thoracic region. There is a lymph node in the aortopulmonary window region measuring 4.0 x 2.8 cm. There are several right paratracheal and pretracheal lymph nodes, largest measuring 2.8 x 2.8 cm. There are multiple enlarged hilar lymph nodes, largest measuring 3.1 x 2.9 cm. There is left hilar adenopathy with the largest lymph node appreciable in the left hilum measuring 3.4 x 2.9 cm. There is sub- carinal adenopathy with the largest sub- carinal lymph node appreciable measuring 2.9 x 2.7 cm.  Lungs/Pleura: There is a masslike area in the anterior segment of the left upper lobe near the apex measuring 2.6 x 2.0 x 2.1 cm. This area has irregular margins. There is a somewhat spiculated nodular opacity abutting the major fissure in the posterior segment of the left upper lobe measuring 1.9 x 1.5 x 1.5 cm. A nearby nodular opacity in this same area measures 1.3 x 1.0 x 1.0 cm. There are confluent opacities in  the right perihilar region with involvement in portions of the right middle and lower lobes at several sites. These areas appear more infiltrative and nodular all the right, although there is a somewhat irregular opacity in the medial segment of the right lower lobe measuring 3.2 x 3.0 x 2.2 cm. There is a nodular opacity in the inferior lingula measuring 1.5 x 1.0 x 1.0 cm. There is a 5 mm nodular opacity in the anterior segment right upper lobe seen on axial slice 30 series 8. There is no pleural effusion or pleural thickening evident.  Upper Abdomen: Liver appears prominent but incompletely visualized. Visualized spleen appears normal. Visualized upper abdominal structures otherwise appear unremarkable.  Musculoskeletal: There is degenerative change in the thoracic spine. There are no blastic or lytic bone lesions.  IMPRESSION: 1. Multiple irregular nodular appearing opacities on the  left, concerning for multifocal neoplasia.  2. Areas of confluent opacity at multiple sites on the right, most notably in the right middle and lower lobes. These areas appear more infiltrative than neoplastic, although neoplasm with superimposed pneumonitis is certainly possible in one or more of these areas.  3. Extensive multifocal adenopathy throughout the hila and mediastinal regions. This appearance heightened suspicion for underlying neoplasm.  4. Given the changes in the lungs, correlation with bronchoscopy may well be warranted. Nuclear medicine PET study also could be helpful to assess for degree of metabolic activity in areas of abnormal lung parenchyma and enlarged lymph nodes.      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   Try to modify your carb intake while you are on this medication.  Follow your weight closely while on the prednisone.  We will repeat a CXR or CT scan of your chest  Follow with Dr BLamonte Sakaiin 1 month  Constipation Try using colace 1061mtwice a day Try using senna or bisacodyl over the counter as needed for constipation.   RoBaltazar ApoMD, PhD 04/10/2017, 5:41 PM [facility] [phone] if no answer [phone]

## 2017-04-10 NOTE — Patient Instructions (Addendum)
We will start prednisone 30mg  daily and plan to take for the next month.   Try to modify your carb intake while you are on this medication.  Follow your weight closely while on the prednisone.  We will repeat a CXR or CT scan of your chest  Try using colace 100mg  twice a day Try using senna or bisacodyl over the counter as needed for constipation.  Follow with Dr in 1 month

## 2017-04-14 ENCOUNTER — Ambulatory Visit: Payer: Self-pay | Admitting: Hematology and Oncology

## 2017-04-14 ENCOUNTER — Other Ambulatory Visit: Payer: Self-pay

## 2017-04-15 ENCOUNTER — Other Ambulatory Visit: Payer: Self-pay

## 2017-04-15 ENCOUNTER — Telehealth: Payer: Self-pay

## 2017-04-15 ENCOUNTER — Ambulatory Visit: Payer: Self-pay | Admitting: Hematology and Oncology

## 2017-04-15 NOTE — Telephone Encounter (Signed)
Pt was a no show for his 0840 appointment on 04/15/2017.  A call was made and a message was left with patient letting him know about the missed appointment and advised to call back to reschedule.

## 2017-04-17 LAB — ACID FAST CULTURE WITH REFLEXED SENSITIVITIES: ACID FAST CULTURE - AFSCU3: NEGATIVE

## 2017-04-17 LAB — ACID FAST CULTURE WITH REFLEXED SENSITIVITIES (MYCOBACTERIA)

## 2017-04-19 NOTE — Progress Notes (Signed)
Appointment has been canceled.

## 2017-04-22 ENCOUNTER — Other Ambulatory Visit: Payer: Self-pay

## 2017-05-19 ENCOUNTER — Ambulatory Visit: Payer: BLUE CROSS/BLUE SHIELD | Admitting: Emergency Medicine

## 2017-05-19 ENCOUNTER — Encounter: Payer: Self-pay | Admitting: Emergency Medicine

## 2017-05-19 DIAGNOSIS — D862 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: Secondary | ICD-10-CM

## 2017-05-19 DIAGNOSIS — D869 Sarcoidosis, unspecified: Secondary | ICD-10-CM | POA: Diagnosis not present

## 2017-05-19 DIAGNOSIS — G4733 Obstructive sleep apnea (adult) (pediatric): Secondary | ICD-10-CM | POA: Insufficient documentation

## 2017-05-19 NOTE — Progress Notes (Signed)
Subjective:    Patient ID: Anthony [name], male    DOB: 1981-03-27, 37 y.o.   MRN: [id number]  HPI  37 year old overweight gentleman with a minimal tobacco history, OSA on CPAP, who is referred from Dr Lebron Conners for evaluation of bilateral nodular infiltrates and mediastinal/hilar lymphadenopathy. He was experiencing SOB which was treated 12/2016 as possible pneumonia. The SOB persisted and he developed cough, ultimately a CT chest was done on 02/05/17 that I have reviewed. This shows lateral nodular infiltrates, central in nature, also with significant mediastinal and hilar lymphadenopathy. He was seen by Dr. Lebron Conners with oncology to further evaluate. Referred now to pursue tissue diagnosis.   He has been experiencing subjective fevers, sweats and decreased appetite. He has exertional dyspnea, especially with stairs. He had some eye swelling that has improved.   Labs showed elevated LDH at 295, ESR 29, ACE level 83, c-RP 55 (markedly elevated).   ROV 04/10/17 --follow-up visit for patient with a history of obstructive sleep apnea and bilateral nodular infiltrates with mediastinal lymphadenopathy.  He underwent endobronchial ultrasound and nodal biopsies on 03/04/17.  Nodal biopsies consistent with granulomatous inflammation.  AFB and fungal studies are negative. Quant gold negative.   ROV 05/19/17 --follow-up visit for patient with history of obesity, obstructive sleep apnea, sarcoidosis based on endobronchial ultrasound and nodal biopsies.  Cultures are all negative.  We started him on prednisone at his last visit. He believes that his breathing has benefited. His rash on his hands is clearing up.   Review of Systems  Constitutional: Positive for appetite change and unexpected weight change. Negative for fever.  HENT: Positive for congestion and dental problem. Negative for ear pain, nosebleeds, postnasal drip, rhinorrhea, sinus pressure, sneezing, sore throat and trouble swallowing.   Eyes: Negative  for redness and itching.  Respiratory: Positive for cough and shortness of breath. Negative for chest tightness and wheezing.   Cardiovascular: Positive for chest pain. Negative for palpitations and leg swelling.  Gastrointestinal: Positive for abdominal pain. Negative for nausea and vomiting.  Genitourinary: Negative for dysuria.  Musculoskeletal: Negative for joint swelling.  Skin: Negative for rash.  Neurological: Positive for headaches.  Hematological: Does not bruise/bleed easily.  Psychiatric/Behavioral: Positive for dysphoric mood. The patient is nervous/anxious.    Past Medical History:  Diagnosis Date  . Anxiety   . Depression   . Dyspnea   . Eczema of hand   . Headache    hx of with tooth pain  . Obese   . Pneumonia    12/2016  . Pre-diabetes   . Sleep apnea    cpap     Family History  Problem Relation Age of Onset  . Hypertension Father   . Diabetes Father   . Stroke Father   . Lymphoma Paternal Uncle   . Ovarian cancer Maternal Grandmother 44     Social History   Socioeconomic History  . Marital status: Married    Spouse name: Not on file  . Number of children: Not on file  . Years of education: Not on file  . Highest education level: Not on file  Social Needs  . Financial resource strain: Not on file  . Food insecurity - worry: Not on file  . Food insecurity - inability: Not on file  . Transportation needs - medical: Not on file  . Transportation needs - non-medical: Not on file  Occupational History  . Occupation: Disabled  Tobacco Use  . Smoking status: Former Smoker  Packs/day: 0.30    Years: 10.00    Pack years: 3.00    Types: Cigarettes    Last attempt to quit: 09/30/2016    Years since quitting: 0.6  . Smokeless tobacco: Never Used  Substance and Sexual Activity  . Alcohol use: No  . Drug use: No  . Sexual activity: Yes    Birth control/protection: Condom  Other Topics Concern  . Not on file  Social History Narrative  . 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   Allergies  Allergen Reactions  . Xarelto [Rivaroxaban]     Bleeding gums, weakness, eye swelling/bluring vision, intensified joint pain     Outpatient Medications Prior to Visit  Medication Sig Dispense Refill  . albuterol (PROVENTIL HFA;VENTOLIN HFA) 108 (90 Base) MCG/ACT inhaler Inhale 2 puffs into the lungs every 4 (four) hours as needed for wheezing or shortness of breath (cough, shortness of breath or wheezing.). 1 Inhaler 1  . Menthol, Topical Analgesic, (ICY HOT EX) Apply 1 application topically daily as needed (muscle pain).    . naproxen (NAPROSYN) 500 MG tablet Take 500 mg by mouth 2 (two) times daily with a meal.    . naproxen sodium (ANAPROX) 220 MG tablet Take 220-440 mg by mouth 2 (two) times daily. Take 440 mg in the morning an 220 mg in the evening    . oxyCODONE-acetaminophen (PERCOCET) 10-325 MG per tablet Take 1 tablet by mouth 3 (three) times daily. pain    . predniSONE (DELTASONE) 10 MG tablet Take 3 tablets (30 mg total) by mouth daily with breakfast. 90 tablet 1  . Pseudoeph-Doxylamine-DM-APAP (NYQUIL PO) Take 1 Dose by mouth at bedtime as needed (cough).     No facility-administered medications prior to visit.         Objective:   Physical Exam Vitals:   05/19/17 1523  BP: 134/62  Pulse: 72  SpO2: 96%  Weight: (!) 512 lb (232.2 kg)  Height: '5\' 8"'  (1.727 m)   Gen: Pleasant, obese man, in no distress,  normal affect  ENT: No lesions,  mouth clear,  oropharynx clear, no postnasal drip  Neck: No JVD, no TMG, no carotid bruits  Lungs: No use of accessory muscles, clear without rales or rhonchi  Cardiovascular: RRR, heart sounds normal, no murmur or gallops, no peripheral edema  Musculoskeletal: No deformities, no cyanosis or clubbing  Neuro: alert, non focal  Skin: Warm, no lesions or rashes    02/05/17 --  COMPARISON:  Chest radiograph January 17, 2017  FINDINGS: Cardiovascular: There is no appreciable thoracic aortic aneurysm. Visualized great vessels appear unremarkable on this noncontrast enhanced study. Pericardium is not appreciably thickened.  Mediastinum/Nodes: Visualized thyroid appears unremarkable. No esophageal lesions are evident.  There is extensive adenopathy throughout the thoracic region. There is a lymph node in the aortopulmonary window region measuring 4.0 x 2.8 cm. There are several right paratracheal and pretracheal lymph nodes, largest measuring 2.8 x 2.8 cm. There are multiple enlarged hilar lymph nodes, largest measuring 3.1 x 2.9 cm. There is left hilar adenopathy with the largest lymph node appreciable in the left hilum measuring 3.4 x 2.9 cm. There is sub- carinal adenopathy with the largest sub- carinal lymph node appreciable measuring 2.9 x 2.7 cm.  Lungs/Pleura: There is a masslike area in the anterior segment of the left upper lobe near the apex measuring 2.6 x 2.0 x 2.1 cm. This area has irregular margins.  There is a somewhat spiculated nodular opacity abutting the major fissure in the posterior segment of the left upper lobe measuring 1.9 x 1.5 x 1.5 cm. A nearby nodular opacity in this same area measures 1.3 x 1.0 x 1.0 cm. There are confluent opacities in the right perihilar region with involvement in portions of the right middle and lower lobes at several sites. These areas appear more infiltrative and nodular all the right, although there is a somewhat irregular opacity in the medial segment of the right lower lobe measuring 3.2 x 3.0 x 2.2 cm. There is a nodular opacity in the inferior lingula measuring 1.5 x 1.0 x 1.0 cm. There is a 5 mm nodular opacity in the anterior segment right upper lobe seen on axial slice 30 series 8. There is no pleural effusion or pleural thickening evident.  Upper Abdomen: Liver appears prominent but incompletely visualized. Visualized spleen appears  normal. Visualized upper abdominal structures otherwise appear unremarkable.  Musculoskeletal: There is degenerative change in the thoracic spine. There are no blastic or lytic bone lesions.  IMPRESSION: 1. Multiple irregular nodular appearing opacities on the left, concerning for multifocal neoplasia.  2. Areas of confluent opacity at multiple sites on the right, most notably in the right middle and lower lobes. These areas appear more infiltrative than neoplastic, although neoplasm with superimposed pneumonitis is certainly possible in one or more of these areas.  3. Extensive multifocal adenopathy throughout the hila and mediastinal regions. This appearance heightened suspicion for underlying neoplasm.  4. Given the changes in the lungs, correlation with bronchoscopy may well be warranted. Nuclear medicine PET study also could be helpful to assess for degree of metabolic activity in areas of abnormal lung parenchyma and enlarged lymph nodes.      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  Then decrease to 10 mg daily for 1 week, then stop altogether. If you have any problems while you are decreasing the prednisone please call our office.  Please call especially for any weakness, dizziness, fatigue. We will repeat CT scan of the chest without contrast to follow sarcoidosis. Follow with Dr Lamonte Sakai in 1 month to review the results of your CT scan and to follow your status off the prednisone.  Obstructive sleep apnea Continue your BiPAP every night.  You will need to follow-up with your sleep physician to see if it is time for you to have a repeat BiPAP titration study in the sleep lab.  Baltazar Apo, MD, PhD 05/19/2017, 3:51 PM [facility] [phone] or if no answer [phone]

## 2017-05-19 NOTE — Assessment & Plan Note (Signed)
Decrease your prednisone to 20 mg daily for the next week.  Then decrease to 10 mg daily for 1 week, then stop altogether. If you have any problems while you are decreasing the prednisone please call our office.  Please call especially for any weakness, dizziness, fatigue. We will repeat CT scan of the chest without contrast to follow sarcoidosis. Follow with Dr Delton Coombes in 1 month to review the results of your CT scan and to follow your status off the prednisone.

## 2017-05-19 NOTE — Assessment & Plan Note (Signed)
Continue your BiPAP every night.  You will need to follow-up with your sleep physician to see if it is time for you to have a repeat BiPAP titration study in the sleep lab.

## 2017-05-19 NOTE — Patient Instructions (Signed)
Decrease your prednisone to 20 mg daily for the next week.  Then decrease to 10 mg daily for 1 week, then stop altogether. If you have any problems while you are decreasing the prednisone please call our office.  Please call especially for any weakness, dizziness, fatigue. We will repeat CT scan of the chest without contrast to follow sarcoidosis. Continue your BiPAP every night.  You will need to follow-up with your sleep physician to see if it is time for you to have a repeat BiPAP titration study in the sleep lab. Follow with Dr Delton Coombes in 1 month to review the results of your CT scan and to follow your status off the prednisone.

## 2017-05-22 ENCOUNTER — Ambulatory Visit (HOSPITAL_COMMUNITY)
Admission: RE | Admit: 2017-05-22 | Discharge: 2017-05-22 | Disposition: A | Discharge status: Home / Self Care | Payer: BLUE CROSS/BLUE SHIELD | Source: Ambulatory Visit | Attending: Emergency Medicine | Admitting: Emergency Medicine

## 2017-05-22 DIAGNOSIS — R918 Other nonspecific abnormal finding of lung field: Secondary | ICD-10-CM | POA: Diagnosis not present

## 2017-05-22 DIAGNOSIS — I700 Atherosclerosis of aorta: Secondary | ICD-10-CM | POA: Diagnosis not present

## 2017-05-22 DIAGNOSIS — K760 Fatty (change of) liver, not elsewhere classified: Secondary | ICD-10-CM | POA: Diagnosis not present

## 2017-05-22 DIAGNOSIS — I2510 Atherosclerotic heart disease of native coronary artery without angina pectoris: Secondary | ICD-10-CM | POA: Diagnosis not present

## 2017-05-22 DIAGNOSIS — D869 Sarcoidosis, unspecified: Secondary | ICD-10-CM | POA: Insufficient documentation

## 2017-06-16 ENCOUNTER — Other Ambulatory Visit: Payer: Self-pay | Admitting: Emergency Medicine

## 2017-06-19 ENCOUNTER — Encounter: Payer: Self-pay | Admitting: Emergency Medicine

## 2017-06-19 ENCOUNTER — Ambulatory Visit: Payer: BLUE CROSS/BLUE SHIELD | Admitting: Emergency Medicine

## 2017-06-19 DIAGNOSIS — D862 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: Secondary | ICD-10-CM

## 2017-06-19 DIAGNOSIS — D869 Sarcoidosis, unspecified: Secondary | ICD-10-CM | POA: Diagnosis not present

## 2017-06-19 MED ORDER — PREDNISONE 10 MG PO TABS: 30.0000 mg | ORAL_TABLET | Freq: Every day | ORAL | 5 refills | Status: DC

## 2017-06-19 MED ORDER — BLOOD GLUCOSE MONITOR KIT: PACK | 0 refills | Status: DC

## 2017-06-19 NOTE — Patient Instructions (Addendum)
We will restart prednisone at 30 mg daily We will check blood work today Please continue your BiPAP mask every night. Follow with Sleep Medicine as planned.  We will obtain glucose monitoring kit for you to check your glucose twice a day.  Please call our office if your glucose is staying greater than 200. We will repeat your CT scan of the chest with contrast in May 2019 to follow sarcoidosis We will perform pulmonary function testing Follow with Dr Lamonte Sakai in 3 months or sooner if you have any problems.

## 2017-06-19 NOTE — Progress Notes (Signed)
Subjective:    Patient ID: Anthony [name], male    DOB: 1980/08/28, 37 y.o.   MRN: [id number]  HPI  37 year old overweight gentleman with a minimal tobacco history, OSA on CPAP, who is referred from Dr Lebron Conners for evaluation of bilateral nodular infiltrates and mediastinal/hilar lymphadenopathy. He was experiencing SOB which was treated 12/2016 as possible pneumonia. The SOB persisted and he developed cough, ultimately a CT chest was done on 02/05/17 that I have reviewed. This shows lateral nodular infiltrates, central in nature, also with significant mediastinal and hilar lymphadenopathy. He was seen by Dr. Lebron Conners with oncology to further evaluate. Referred now to pursue tissue diagnosis.   He has been experiencing subjective fevers, sweats and decreased appetite. He has exertional dyspnea, especially with stairs. He had some eye swelling that has improved.   Labs showed elevated LDH at 295, ESR 29, ACE level 83, c-RP 55 (markedly elevated).   ROV 04/10/17 --follow-up visit for patient with a history of obstructive sleep apnea and bilateral nodular infiltrates with mediastinal lymphadenopathy.  He underwent endobronchial ultrasound and nodal biopsies on 03/04/17.  Nodal biopsies consistent with granulomatous inflammation.  AFB and fungal studies are negative. Quant gold negative.   ROV 05/19/17 --follow-up visit for patient with history of obesity, obstructive sleep apnea, sarcoidosis based on endobronchial ultrasound and nodal biopsies.  Cultures are all negative.  We started him on prednisone at his last visit. He believes that his breathing has benefited. His rash on his hands is clearing up.   ROV 06/19/17 --patient is 37 years old, has a history of sarcoidosis.  We treated him with an extended course of prednisone.  Last visit 1 month ago we started to taper with a plan to come off completely.  CT chest was done on 05/22/17 that I have reviewed.  This shows improvement in his bilateral hilar and  mediastinal lymphadenopathy, possibly some mild decrease in his nodular pulmonary infiltrates although they look mostly unchanged. Since he came off the prednisone his breathing has become more difficult, trouble with walking, trouble with stairs. He had been able to shop at the store before the pred was stopped. He tried albuterol - no real change. He has had some increased cough, his wife has heard some wheeze.    Review of Systems  Constitutional: Positive for appetite change and unexpected weight change. Negative for fever.  HENT: Positive for congestion and dental problem. Negative for ear pain, nosebleeds, postnasal drip, rhinorrhea, sinus pressure, sneezing, sore throat and trouble swallowing.   Eyes: Negative for redness and itching.  Respiratory: Positive for cough and shortness of breath. Negative for chest tightness and wheezing.   Cardiovascular: Positive for chest pain. Negative for palpitations and leg swelling.  Gastrointestinal: Positive for abdominal pain. Negative for nausea and vomiting.  Genitourinary: Negative for dysuria.  Musculoskeletal: Negative for joint swelling.  Skin: Negative for rash.  Neurological: Positive for headaches.  Hematological: Does not bruise/bleed easily.  Psychiatric/Behavioral: Positive for dysphoric mood. The patient is nervous/anxious.      Objective:   Physical Exam Vitals:   06/19/17 0908 06/19/17 0909  BP:  126/72  Pulse:  66  SpO2:  98%  Weight: (!) 528 lb (239.5 kg)   Height: '5\' 8"'  (1.727 m)    Gen: Pleasant, obese man, in no distress,  normal affect  ENT: No lesions,  mouth clear,  oropharynx clear, no postnasal drip  Neck: No JVD, no TMG, no carotid bruits  Lungs: No use of accessory muscles,  clear without rales or rhonchi  Cardiovascular: RRR, heart sounds normal, no murmur or gallops, no peripheral edema  Musculoskeletal: No deformities, no cyanosis or clubbing  Neuro: alert, non focal  Skin: Warm, no lesions or  rashes    02/05/17 --  COMPARISON:  Chest radiograph January 17, 2017  FINDINGS: Cardiovascular: There is no appreciable thoracic aortic aneurysm. Visualized great vessels appear unremarkable on this noncontrast enhanced study. Pericardium is not appreciably thickened.  Mediastinum/Nodes: Visualized thyroid appears unremarkable. No esophageal lesions are evident.  There is extensive adenopathy throughout the thoracic region. There is a lymph node in the aortopulmonary window region measuring 4.0 x 2.8 cm. There are several right paratracheal and pretracheal lymph nodes, largest measuring 2.8 x 2.8 cm. There are multiple enlarged hilar lymph nodes, largest measuring 3.1 x 2.9 cm. There is left hilar adenopathy with the largest lymph node appreciable in the left hilum measuring 3.4 x 2.9 cm. There is sub- carinal adenopathy with the largest sub- carinal lymph node appreciable measuring 2.9 x 2.7 cm.  Lungs/Pleura: There is a masslike area in the anterior segment of the left upper lobe near the apex measuring 2.6 x 2.0 x 2.1 cm. This area has irregular margins. There is a somewhat spiculated nodular opacity abutting the major fissure in the posterior segment of the left upper lobe measuring 1.9 x 1.5 x 1.5 cm. A nearby nodular opacity in this same area measures 1.3 x 1.0 x 1.0 cm. There are confluent opacities in the right perihilar region with involvement in portions of the right middle and lower lobes at several sites. These areas appear more infiltrative and nodular all the right, although there is a somewhat irregular opacity in the medial segment of the right lower lobe measuring 3.2 x 3.0 x 2.2 cm. There is a nodular opacity in the inferior lingula measuring 1.5 x 1.0 x 1.0 cm. There is a 5 mm nodular opacity in the anterior segment right upper lobe seen on axial slice 30 series 8. There is no pleural effusion or pleural thickening evident.  Upper Abdomen: Liver  appears prominent but incompletely visualized. Visualized spleen appears normal. Visualized upper abdominal structures otherwise appear unremarkable.  Musculoskeletal: There is degenerative change in the thoracic spine. There are no blastic or lytic bone lesions.  IMPRESSION: 1. Multiple irregular nodular appearing opacities on the left, concerning for multifocal neoplasia.  2. Areas of confluent opacity at multiple sites on the right, most notably in the right middle and lower lobes. These areas appear more infiltrative than neoplastic, although neoplasm with superimposed pneumonitis is certainly possible in one or more of these areas.  3. Extensive multifocal adenopathy throughout the hila and mediastinal regions. This appearance heightened suspicion for underlying neoplasm.  4. Given the changes in the lungs, correlation with bronchoscopy may well be warranted. Nuclear medicine PET study also could be helpful to assess for degree of metabolic activity in areas of abnormal lung parenchyma and enlarged lymph nodes.      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  His lymphadenopathy did shrink some on CT scan of the chest from 05/22/17.  He is more dyspneic now that he is off prednisone.  This coupled with the persistent infiltrates suggest that he has active disease.  Unclear how much obstruction he has.  He will need pulmonary function testing. I will put him back on prednisone 30 mg.  Repeat CT scan in 3 months.  If his infiltrates do not clear or if he has continued symptoms then we may need to look for a steroid sparing agent.  Check his ACE level today  We will restart prednisone at 30 mg daily We will check blood work today Please continue your BiPAP mask every night. Follow with Sleep Medicine as planned.  We will obtain glucose monitoring kit for you to check your glucose twice a  day.  Please call our office if your glucose is staying greater than 200. We will repeat your CT scan of the chest with contrast in May 2019 to follow sarcoidosis We will perform pulmonary function testing Follow with Dr Lamonte Sakai in 3 months or sooner if you have any problems.   Baltazar Apo, MD, PhD 06/19/2017, 1:38 PM [facility] [phone] or if no answer [phone]

## 2017-06-19 NOTE — Assessment & Plan Note (Signed)
Sarcoidosis with persistent pulmonary infiltrates.  His lymphadenopathy did shrink some on CT scan of the chest from 05/22/17.  He is more dyspneic now that he is off prednisone.  This coupled with the persistent infiltrates suggest that he has active disease.  Unclear how much obstruction he has.  He will need pulmonary function testing. I will put him back on prednisone 30 mg.  Repeat CT scan in 3 months.  If his infiltrates do not clear or if he has continued symptoms then we may need to look for a steroid sparing agent.  Check his ACE level today  We will restart prednisone at 30 mg daily We will check blood work today Please continue your BiPAP mask every night. Follow with Sleep Medicine as planned.  We will obtain glucose monitoring kit for you to check your glucose twice a day.  Please call our office if your glucose is staying greater than 200. We will repeat your CT scan of the chest with contrast in May 2019 to follow sarcoidosis We will perform pulmonary function testing Follow with Dr Lamonte Sakai in 3 months or sooner if you have any problems.

## 2017-07-11 ENCOUNTER — Telehealth: Payer: Self-pay | Admitting: Emergency Medicine

## 2017-07-11 NOTE — Telephone Encounter (Signed)
Pt used to go to his PCP Dr. Billee Cashing to get his pain managed but Dr. Dimas Millin office has been completely shut down.  Pt is wanting to know what Dr. Delton Coombes recommends for pt.  Dr. Delton Coombes, any suggestions/recommendations you might have for pt whether it be another doctor for pt to go to for help with managing his pain?  Please advise. Thanks!

## 2017-07-14 NOTE — Telephone Encounter (Signed)
Attempted to call pt but no answer.   Left message for pt to return our call x1 

## 2017-07-14 NOTE — Telephone Encounter (Signed)
He may want to try [facility] on [address]

## 2017-07-15 NOTE — Telephone Encounter (Signed)
Called pt and advised message from the provider. Pt understood and verbalized understanding. Nothing further is needed.    

## 2017-07-15 NOTE — Telephone Encounter (Signed)
Pt is calling back [phone]

## 2017-07-21 ENCOUNTER — Telehealth: Payer: Self-pay | Admitting: Emergency Medicine

## 2017-07-21 DIAGNOSIS — R52 Pain, unspecified: Secondary | ICD-10-CM

## 2017-07-21 NOTE — Telephone Encounter (Signed)
Called and spoke with patient, he stated that his doctor that he was seeing lost their license and is no longer in practice. He is needing a referral to [facility] on [address]   RB please advise if you are ok with this, thanks.

## 2017-07-21 NOTE — Telephone Encounter (Signed)
Spoke with patient. He is aware of the referral. Nothing else needed at time of call.

## 2017-07-21 NOTE — Telephone Encounter (Signed)
Yes please make the referral

## 2017-07-25 DIAGNOSIS — G4733 Obstructive sleep apnea (adult) (pediatric): Secondary | ICD-10-CM | POA: Diagnosis not present

## 2017-08-15 ENCOUNTER — Telehealth: Payer: Self-pay | Admitting: Emergency Medicine

## 2017-08-15 NOTE — Telephone Encounter (Signed)
Spoke with the pt  He is asking for rx for percocet for his back pain, knee pain and sciatic pain  He states that this had previously been prescribed by Dr. Candida Peeling, but he is no longer practicing here  We had already made referral to pain clinic for him, and he states that it's going to take approx 4 months for an appt  I advised that we will check with Dr Delton Coombes, as he is back in the office on 08/18/17  Pt verbalized understanding  Please advise thanks!

## 2017-08-18 MED ORDER — OXYCODONE-ACETAMINOPHEN 10-325 MG PO TABS: 1.0000 | ORAL_TABLET | Freq: Three times a day (TID) | ORAL | 0 refills | Status: DC

## 2017-08-18 NOTE — Telephone Encounter (Signed)
Left detailed message stating that we are still waiting on Dr. Kavin Leech response.   RB, please advise. Thanks!

## 2017-08-18 NOTE — Telephone Encounter (Signed)
Ok to refill monthly for the next 4 months until he can be seen and assessed by the pain clinic

## 2017-08-18 NOTE — Telephone Encounter (Signed)
Pt calling back for an update on his request for Rx of Percocet. Cb is [phone]

## 2017-08-18 NOTE — Telephone Encounter (Signed)
Spoke with the pt and notified of recs per RB  He verbalized understanding  Rx printed and up front for pick up

## 2017-09-09 ENCOUNTER — Telehealth: Payer: Self-pay | Admitting: Emergency Medicine

## 2017-09-09 NOTE — Telephone Encounter (Signed)
Called and spoke with patient he states that he is out of his medication percocet and requesting a refill to be sent in. I advised patient that we can not refill the medication at this time as we just refilled it on the 8th of April. Nothing further needed at this time.

## 2017-09-10 ENCOUNTER — Ambulatory Visit (HOSPITAL_COMMUNITY)
Admission: RE | Admit: 2017-09-10 | Discharge: 2017-09-10 | Disposition: A | Discharge status: Home / Self Care | Payer: BLUE CROSS/BLUE SHIELD | Source: Ambulatory Visit | Attending: Emergency Medicine | Admitting: Emergency Medicine

## 2017-09-10 DIAGNOSIS — D869 Sarcoidosis, unspecified: Secondary | ICD-10-CM

## 2017-09-10 DIAGNOSIS — R918 Other nonspecific abnormal finding of lung field: Secondary | ICD-10-CM | POA: Diagnosis not present

## 2017-09-10 MED ORDER — IOHEXOL 300 MG/ML  SOLN
75.0000 mL | Freq: Once | INTRAMUSCULAR | Status: AC | PRN
  Administered 2017-09-10: 75 mL via INTRAVENOUS

## 2017-09-17 ENCOUNTER — Telehealth: Payer: Self-pay | Admitting: Emergency Medicine

## 2017-09-17 MED ORDER — OXYCODONE-ACETAMINOPHEN 10-325 MG PO TABS: 1.0000 | ORAL_TABLET | Freq: Three times a day (TID) | ORAL | 0 refills | Status: DC

## 2017-09-17 NOTE — Telephone Encounter (Signed)
I have reviewed all of the most recent records from his visits with Dr. Delton Coombes and there is no discussion about prescription of Percocet.

## 2017-09-17 NOTE — Telephone Encounter (Signed)
Pt called last month for this refill and Dr. Delton Coombes stated that he would refill this for the next 4 months for him.  Dr. Kendrick Fries - would you be willing to sign this prescription as Dr. Delton Coombes is not here to sign this. Thanks.

## 2017-09-17 NOTE — Telephone Encounter (Signed)
OK to refill

## 2017-09-17 NOTE — Telephone Encounter (Signed)
Rx has been printed, signed and placed up front for pick up. Pt is aware. Nothing further was needed. 

## 2017-09-17 NOTE — Telephone Encounter (Signed)
This documentation is in a telephone encounter dated 08/15/17.

## 2017-09-22 ENCOUNTER — Ambulatory Visit: Payer: BLUE CROSS/BLUE SHIELD | Admitting: Emergency Medicine

## 2017-09-22 ENCOUNTER — Encounter: Payer: Self-pay | Admitting: Emergency Medicine

## 2017-09-22 ENCOUNTER — Ambulatory Visit (INDEPENDENT_AMBULATORY_CARE_PROVIDER_SITE_OTHER): Payer: BLUE CROSS/BLUE SHIELD | Admitting: Emergency Medicine

## 2017-09-22 DIAGNOSIS — G4733 Obstructive sleep apnea (adult) (pediatric): Secondary | ICD-10-CM

## 2017-09-22 DIAGNOSIS — D862 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: Secondary | ICD-10-CM

## 2017-09-22 DIAGNOSIS — D869 Sarcoidosis, unspecified: Secondary | ICD-10-CM

## 2017-09-22 LAB — PULMONARY FUNCTION TEST
DL/VA % pred: 159 %
DL/VA: 7.27 ml/min/mmHg/L
DLCO unc % pred: 174 %
DLCO unc: 51.72 ml/min/mmHg
FEF 25-75 Post: 2.89 L/sec
FEF 25-75 Pre: 4.09 L/sec
FEF2575-%Change-Post: -29 %
FEF2575-%Pred-Post: 76 %
FEF2575-%Pred-Pre: 109 %
FEV1-%Change-Post: -7 %
FEV1-%Pred-Post: 89 %
FEV1-%Pred-Pre: 97 %
FEV1-Post: 3.1 L
FEV1-Pre: 3.36 L
FEV1FVC-%Change-Post: 0 %
FEV1FVC-%Pred-Pre: 101 %
FEV6-%Change-Post: -7 %
FEV6-%Pred-Post: 89 %
FEV6-%Pred-Pre: 97 %
FEV6-Post: 3.68 L
FEV6-Pre: 3.98 L
FEV6FVC-%Pred-Post: 102 %
FEV6FVC-%Pred-Pre: 102 %
FVC-%Change-Post: -7 %
FVC-%Pred-Post: 88 %
FVC-%Pred-Pre: 95 %
FVC-Post: 3.68 L
FVC-Pre: 3.98 L
Post FEV1/FVC ratio: 84 %
Post FEV6/FVC ratio: 100 %
Pre FEV1/FVC ratio: 84 %
Pre FEV6/FVC Ratio: 100 %

## 2017-09-22 NOTE — Assessment & Plan Note (Signed)
Agree with following up with your bariatric physicians. We will need to continue to work on weight loss and exercise.  We can refer you to [place] [facility] if you believe that you could benefit.

## 2017-09-22 NOTE — Progress Notes (Signed)
PFT scheduled today 09/22/17. Pre and post completed as well as DLCO. Unable to complete Pleth due to not being able to shut the door. Nitrogen washout was unable to be obtained.

## 2017-09-22 NOTE — Patient Instructions (Signed)
Your CT scan of the chest shows improvement in your areas of sarcoidosis in both lungs as well as your lymph node swelling. Your pulmonary function testing from today does not show a significant contribution from asthma. Based on your results I would like to taper your prednisone as follows.  Please decrease to 20 mg daily for the next 2 weeks.  If you tolerate this then decrease to 10 mg daily for 2 weeks and then stop. Please keep albuterol available to use 2 puffs if needed for shortness of breath.  We will not start an every day asthma medication at this time based on your breathing tests. Please continue your CPAP every night. Agree with following up with your bariatric physicians. We will need to continue to work on weight loss and exercise.  We can refer you to [place] [facility] if you believe that you could benefit. Follow with Dr Delton Coombes in 1 month or next available.

## 2017-09-22 NOTE — Assessment & Plan Note (Addendum)
His chest CT is improved compared with both of his priors, January and last August.  It is a better improvement then he got the first time we treated him with prednisone.  I am hopeful that may be we will get him into remission.  If not then he will likely need a steroid sparing agent like methotrexate.  I will wean his prednisone off and follow him clinically and radiographically.  His pulmonary function testing did not show any evidence of significant obstruction, his flow volume loops were normal with evidence for some mild restriction.  Your CT scan of the chest shows improvement in your areas of sarcoidosis in both lungs as well as your lymph node swelling. Your pulmonary function testing from today does not show a significant contribution from asthma. Based on your results I would like to taper your prednisone as follows.  Please decrease to 20 mg daily for the next 2 weeks.  If you tolerate this then decrease to 10 mg daily for 2 weeks and then stop. Please keep albuterol available to use 2 puffs if needed for shortness of breath.  We will not start an every day asthma medication at this time based on your breathing tests. Agree with following up with your bariatric physicians. We will need to continue to work on weight loss and exercise.  We can refer you to [place] [facility] if you believe that you could benefit. Follow with Dr Delton Coombes in 1 month or next available.

## 2017-09-22 NOTE — Progress Notes (Signed)
Subjective:    Patient ID: Anthony [name], male    DOB: 02-08-1981, 37 y.o.   MRN: [id number]  HPI  37 year old overweight gentleman with a minimal tobacco history, OSA on CPAP, who is referred from Dr Lebron Conners for evaluation of bilateral nodular infiltrates and mediastinal/hilar lymphadenopathy. He was experiencing SOB which was treated 12/2016 as possible pneumonia. The SOB persisted and he developed cough, ultimately a CT chest was done on 02/05/17 that I have reviewed. This shows lateral nodular infiltrates, central in nature, also with significant mediastinal and hilar lymphadenopathy. He was seen by Dr. Lebron Conners with oncology to further evaluate. Referred now to pursue tissue diagnosis.   He has been experiencing subjective fevers, sweats and decreased appetite. He has exertional dyspnea, especially with stairs. He had some eye swelling that has improved.   Labs showed elevated LDH at 295, ESR 29, ACE level 83, c-RP 55 (markedly elevated).   ROV 04/10/17 --follow-up visit for patient with a history of obstructive sleep apnea and bilateral nodular infiltrates with mediastinal lymphadenopathy.  He underwent endobronchial ultrasound and nodal biopsies on 03/04/17.  Nodal biopsies consistent with granulomatous inflammation.  AFB and fungal studies are negative. Quant gold negative.   ROV 05/19/17 --follow-up visit for patient with history of obesity, obstructive sleep apnea, sarcoidosis based on endobronchial ultrasound and nodal biopsies.  Cultures are all negative.  We started him on prednisone at his last visit. He believes that his breathing has benefited. His rash on his hands is clearing up.   ROV 06/19/17 --patient is 37 years old, has a history of sarcoidosis.  We treated him with an extended course of prednisone.  Last visit 1 month ago we started to taper with a plan to come off completely.  CT chest was done on 05/22/17 that I have reviewed.  This shows improvement in his bilateral hilar and  mediastinal lymphadenopathy, possibly some mild decrease in his nodular pulmonary infiltrates although they look mostly unchanged. Since he came off the prednisone his breathing has become more difficult, trouble with walking, trouble with stairs. He had been able to shop at the store before the pred was stopped. He tried albuterol - no real change. He has had some increased cough, his wife has heard some wheeze.   ROV 09/22/17 --patient has a history of sarcoidosis for which she has been treated in the past with prednisone.  He had bilateral hilar and mediastinal lymphadenopathy as well as some nodular pulmonary infiltrates.  At his visit in February based on clinical symptoms and his CT scan from January 2019 I have restarted his scheduled prednisone at 30 mg daily.  He had a repeat CT scan of the chest on 09/11/2017 that I reviewed.  This shows a market decrease in the size of his right perihilar pulmonary parenchymal nodules, persistent left upper lobe nodule with linear platelike scarring, some decrease in his mild mediastinal lymphadenopathy.  He underwent pulmonary function testing on 09/22/2017 that I have reviewed.  This shows evidence for probable restriction, little to support coexisting obstruction.  His diffusion capacity was elevated. He still needs to work on getting a pain clinic appointment. He is going to return to see a bariatric surgeon for his weight. He remains on CPAP, reliable with it. He does clinically benefit from it. He has albuterol, uses rarely.    Review of Systems  Constitutional: Positive for appetite change and unexpected weight change. Negative for fever.  HENT: Positive for congestion and dental problem. Negative for ear  pain, nosebleeds, postnasal drip, rhinorrhea, sinus pressure, sneezing, sore throat and trouble swallowing.   Eyes: Negative for redness and itching.  Respiratory: Positive for cough and shortness of breath. Negative for chest tightness and wheezing.     Cardiovascular: Positive for chest pain. Negative for palpitations and leg swelling.  Gastrointestinal: Positive for abdominal pain. Negative for nausea and vomiting.  Genitourinary: Negative for dysuria.  Musculoskeletal: Negative for joint swelling.  Skin: Negative for rash.  Neurological: Positive for headaches.  Hematological: Does not bruise/bleed easily.  Psychiatric/Behavioral: Positive for dysphoric mood. The patient is nervous/anxious.      Objective:   Physical Exam Vitals:   09/22/17 1008  BP: 116/74  Pulse: 64  SpO2: 97%  Weight: (!) 558 lb (253.1 kg)  Height: '5\' 8"'  (1.727 m)   Gen: Pleasant, obese man, in no distress,  normal affect  ENT: No lesions,  mouth clear,  oropharynx clear, no postnasal drip  Neck: No JVD, no stridor  Lungs: No use of accessory muscles, clear without rales or rhonchi  Cardiovascular: RRR, heart sounds normal, no murmur or gallops, no peripheral edema  Musculoskeletal: No deformities, no cyanosis or clubbing  Neuro: alert, non focal  Skin: Warm, healing patchy dryness on his R hand.     CT chest 09/11/17 --  COMPARISON:  CT 05/22/2017  FINDINGS: Cardiovascular: No significant vascular findings. Normal heart size. No pericardial effusion.  Mediastinum/Nodes: No axillary supraclavicular adenopathy. Mild mediastinal adenopathy similar. For example RIGHT paratracheal lymph node measures 12 mm short axis compared to 14 mm remeasured. Prevascular lymph node measures 7 mm compared with 8 mm. No new or enlarged mediastinal lymph nodes. No hilar adenopathy.  Esophagus normal.  No pericardial effusion  Lungs/Pleura: Pulmonary nodules have decreased in size compared to prior. For example LEFT upper lobe nodule presents as thin linear scarring measuring 6 mm x 12 mm decreased from nodule lesion measuring 25 x 18 mm (image 39/5).  Large perihilar nodules in the RIGHT lower lobe of also decreased in size with platelike linear  scarring remaining. Example RIGHT upper lobe nodule/linear scarring measuring 27 x 19 mm decreased from 38 x 30 mm. Similar findings in the RIGHT lower lobe. No new nodularity. Airways are normal.  Upper Abdomen: Limited view of the liver, kidneys, pancreas are unremarkable. Normal adrenal glands.  Musculoskeletal: No aggressive osseous lesion.  IMPRESSION: 1. Marked decrease in size of RIGHT perihilar pulmonary parenchymal nodules and LEFT upper lobe nodule with linear platelike scarring remaining. 2. 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  It is a better improvement then he got the first time we treated him with prednisone.  I am hopeful that may be we will get him into remission.  If not then he will likely need a steroid sparing agent like methotrexate.  I will wean his prednisone off and follow him clinically and radiographically.  His pulmonary function testing did not show any evidence of significant obstruction, his flow volume loops were normal with evidence for some mild restriction.  Your CT scan of the chest shows improvement in your areas of sarcoidosis in both lungs as well as your lymph node swelling. Your pulmonary function testing from today does not show a significant contribution from asthma. Based on your results I would like to taper your  prednisone as follows.  Please decrease to 20 mg daily for the next 2 weeks.  If you tolerate this then decrease to 10 mg daily for 2 weeks and then stop. Please keep albuterol available to use 2 puffs if needed for shortness of breath.  We will not start an every day asthma medication at this time based on your breathing tests. Agree with following up with your bariatric physicians. We will need to continue to work on weight loss and exercise.   We can refer you to [facility] if you believe that you could benefit. Follow with Dr Lamonte Sakai in 1 month or next available.   Obstructive sleep apnea Please continue your CPAP every night.  Morbid obesity ([place]) Agree with following up with your bariatric physicians. We will need to continue to work on weight loss and exercise.  We can refer you to [facility] if you believe that you could benefit.  Baltazar Apo, MD, PhD 09/22/2017, 10:41 AM [facility] [phone] or if no answer [phone]

## 2017-09-22 NOTE — Assessment & Plan Note (Signed)
Please continue your CPAP every night.  

## 2017-10-17 ENCOUNTER — Telehealth: Payer: Self-pay | Admitting: Emergency Medicine

## 2017-10-17 MED ORDER — OXYCODONE-ACETAMINOPHEN 10-325 MG PO TABS: 1.0000 | ORAL_TABLET | Freq: Three times a day (TID) | ORAL | 0 refills | Status: DC

## 2017-10-17 NOTE — Telephone Encounter (Signed)
Percocet 10-325 #90, no refills, printed and signed by MW.  Prescription placed in sealed envelope, up front.  Patient notified of prescription ready for pickup, and that he needed to bring a picture ID. Patient stated understanding.  Nothing further at this time.

## 2017-10-17 NOTE — Telephone Encounter (Signed)
Per a telephone encounter dated 08/15/17, RB said that he would refill this medication for 4 months for him.  MW - please advise if you would be willing to sign this prescription since RB is not here in the office. Thanks.

## 2017-10-17 NOTE — Telephone Encounter (Signed)
yes

## 2017-11-04 ENCOUNTER — Encounter: Payer: Self-pay | Admitting: Emergency Medicine

## 2017-11-04 ENCOUNTER — Ambulatory Visit: Payer: BLUE CROSS/BLUE SHIELD | Admitting: Emergency Medicine

## 2017-11-04 VITALS — BP 118/68 | HR 90 | Ht 68.0 in | Wt >= 6400 oz

## 2017-11-04 DIAGNOSIS — D862 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: Secondary | ICD-10-CM

## 2017-11-04 DIAGNOSIS — L89219 Pressure ulcer of right hip, unspecified stage: Secondary | ICD-10-CM

## 2017-11-04 DIAGNOSIS — G4733 Obstructive sleep apnea (adult) (pediatric): Secondary | ICD-10-CM

## 2017-11-04 DIAGNOSIS — L89209 Pressure ulcer of unspecified hip, unspecified stage: Secondary | ICD-10-CM | POA: Insufficient documentation

## 2017-11-04 NOTE — Patient Instructions (Addendum)
We will try to stay off of the prednisone for another month, will follow your breathing and skin changes from your sarcoidosis.  Please continue your CPAP every night.  Take albuterol 2 puffs up to every 4 hours if needed for shortness of breath.  If your sarcoidosis becomes more active over the next month then we will consider starting an alternative medication like Methotrexate or Imuran.  We will refer you to the wound care clinic to evaluate your right flank and right hip.  Follow with Dr Delton Coombes in 1 month or next available to reassess.

## 2017-11-04 NOTE — Progress Notes (Signed)
Subjective:    Patient ID: Anthony [name], male    DOB: [birth year], 37 y.o.   MRN: [id number]  HPI  37 year old overweight gentleman with a minimal tobacco history, OSA on CPAP, who is referred from Dr Lebron Conners for evaluation of bilateral nodular infiltrates and mediastinal/hilar lymphadenopathy. He was experiencing SOB which was treated 12/2016 as possible pneumonia. The SOB persisted and he developed cough, ultimately a CT chest was done on 02/05/17 that I have reviewed. This shows lateral nodular infiltrates, central in nature, also with significant mediastinal and hilar lymphadenopathy. He was seen by Dr. Lebron Conners with oncology to further evaluate. Referred now to pursue tissue diagnosis.   He has been experiencing subjective fevers, sweats and decreased appetite. He has exertional dyspnea, especially with stairs. He had some eye swelling that has improved.   Labs showed elevated LDH at 295, ESR 29, ACE level 83, c-RP 55 (markedly elevated).   ROV 04/10/17 --follow-up visit for patient with a history of obstructive sleep apnea and bilateral nodular infiltrates with mediastinal lymphadenopathy.  He underwent endobronchial ultrasound and nodal biopsies on 03/04/17.  Nodal biopsies consistent with granulomatous inflammation.  AFB and fungal studies are negative. Quant gold negative.   ROV 05/19/17 --follow-up visit for patient with history of obesity, obstructive sleep apnea, sarcoidosis based on endobronchial ultrasound and nodal biopsies.  Cultures are all negative.  We started him on prednisone at his last visit. He believes that his breathing has benefited. His rash on his hands is clearing up.   ROV 06/19/17 --patient is 37 years old, has a history of sarcoidosis.  We treated him with an extended course of prednisone.  Last visit 1 month ago we started to taper with a plan to come off completely.  CT chest was done on 05/22/17 that I have reviewed.  This shows improvement in his bilateral hilar and  mediastinal lymphadenopathy, possibly some mild decrease in his nodular pulmonary infiltrates although they look mostly unchanged. Since he came off the prednisone his breathing has become more difficult, trouble with walking, trouble with stairs. He had been able to shop at the store before the pred was stopped. He tried albuterol - no real change. He has had some increased cough, his wife has heard some wheeze.   ROV 09/22/17 --patient has a history of sarcoidosis for which she has been treated in the past with prednisone.  He had bilateral hilar and mediastinal lymphadenopathy as well as some nodular pulmonary infiltrates.  At his visit in February based on clinical symptoms and his CT scan from January 2019 I have restarted his scheduled prednisone at 30 mg daily.  He had a repeat CT scan of the chest on 09/11/2017 that I reviewed.  This shows a market decrease in the size of his right perihilar pulmonary parenchymal nodules, persistent left upper lobe nodule with linear platelike scarring, some decrease in his mild mediastinal lymphadenopathy.  He underwent pulmonary function testing on 09/22/2017 that I have reviewed.  This shows evidence for probable restriction, little to support coexisting obstruction.  His diffusion capacity was elevated. He still needs to work on getting a pain clinic appointment. He is going to return to see a bariatric surgeon for his weight. He remains on CPAP, reliable with it. He does clinically benefit from it. He has albuterol, uses rarely.   ROV 11/04/17 --this is a follow-up visit for pleasant 37 year old gentleman with a history of obesity, sarcoidosis diagnosed by nodal biopsies and characterized by mediastinal lymphadenopathy with nodular  pulmonary infiltrates. He also has rash and some nodular lesions on R > L hand.   We had put him on prednisone earlier in the year based on interval increase in the infiltrates on CT scan of the chest.  He is currently weaned to off, has been  off for about 2 weeks.  He has albuterol available for symptom management but has not been on a scheduled bronchodilator.  He uses CPAP reliably every night.    Review of Systems  Constitutional: Positive for appetite change and unexpected weight change. Negative for fever.  HENT: Positive for congestion and dental problem. Negative for ear pain, nosebleeds, postnasal drip, rhinorrhea, sinus pressure, sneezing, sore throat and trouble swallowing.   Eyes: Negative for redness and itching.  Respiratory: Positive for cough and shortness of breath. Negative for chest tightness and wheezing.   Cardiovascular: Positive for chest pain. Negative for palpitations and leg swelling.  Gastrointestinal: Positive for abdominal pain. Negative for nausea and vomiting.  Genitourinary: Negative for dysuria.  Musculoskeletal: Negative for joint swelling.  Skin: Negative for rash.  Neurological: Positive for headaches.  Hematological: Does not bruise/bleed easily.  Psychiatric/Behavioral: Positive for dysphoric mood. The patient is nervous/anxious.      Objective:   Physical Exam Vitals:   11/04/17 1007  BP: 118/68  Pulse: 90  SpO2: 95%  Weight: (!) 557 lb (252.7 kg)  Height: '5\' 8"'  (1.727 m)   Gen: Pleasant, obese man, in no distress,  normal affect  ENT: No lesions,  mouth clear,  oropharynx clear, no postnasal drip  Neck: No JVD, no stridor  Lungs: No use of accessory muscles, clear without rales or rhonchi  Cardiovascular: RRR, heart sounds normal, no murmur or gallops, no peripheral edema  Musculoskeletal: No deformities, no cyanosis or clubbing  Neuro: alert, non focal  Skin: Warm, healing patchy dryness on his R hand.     CT chest 09/11/17 --  COMPARISON:  CT 05/22/2017  FINDINGS: Cardiovascular: No significant vascular findings. Normal heart size. No pericardial effusion.  Mediastinum/Nodes: No axillary supraclavicular adenopathy. Mild mediastinal adenopathy similar. For  example RIGHT paratracheal lymph node measures 12 mm short axis compared to 14 mm remeasured. Prevascular lymph node measures 7 mm compared with 8 mm. No new or enlarged mediastinal lymph nodes. No hilar adenopathy.  Esophagus normal.  No pericardial effusion  Lungs/Pleura: Pulmonary nodules have decreased in size compared to prior. For example LEFT upper lobe nodule presents as thin linear scarring measuring 6 mm x 12 mm decreased from nodule lesion measuring 25 x 18 mm (image 39/5).  Large perihilar nodules in the RIGHT lower lobe of also decreased in size with platelike linear scarring remaining. Example RIGHT upper lobe nodule/linear scarring measuring 27 x 19 mm decreased from 38 x 30 mm. Similar findings in the RIGHT lower lobe. No new nodularity. Airways are normal.  Upper Abdomen: Limited view of the liver, kidneys, pancreas are unremarkable. Normal adrenal glands.  Musculoskeletal: No aggressive osseous lesion.  IMPRESSION: 1. Marked decrease in size of RIGHT perihilar pulmonary parenchymal nodules and LEFT upper lobe nodule with linear platelike scarring remaining. 2. Mild mediastinal adenopathy is similar to slightly decreased       Assessment & Plan:  Pressure sore of hip He has noted a pressure sore on his right hip.  He also has a healing lesion on his right lateral abdomen.  We talked about ways to keep pressure off the hip, some simple wound care strategies but I think he needs to  go to the wound care clinic to have it looked at.  I will refer him today.  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  We got him off his prednisone and is been off for about 2 weeks.  He is having some increase in dyspnea compared to when he was on the prednisone.  Unfortunately also his right hand skin changes are becoming more noticeable.  Not clear to me that he is going to  be able to stay off immunosuppression.  If his breathing continues to decline or if his skin changes become more active than I think we will have to move to methotrexate.  We will assess this in 1 month  Obstructive sleep apnea Continue CPAP every night  Baltazar Apo, MD, PhD 11/04/2017, 10:30 AM  [facility] [phone] or if no answer [phone]

## 2017-11-04 NOTE — Assessment & Plan Note (Signed)
Continue CPAP every night. 

## 2017-11-04 NOTE — Assessment & Plan Note (Signed)
He is radiographically improved on CT scan from 09/11/2017, had also clinically improved.  We got him off his prednisone and is been off for about 2 weeks.  He is having some increase in dyspnea compared to when he was on the prednisone.  Unfortunately also his right hand skin changes are becoming more noticeable.  Not clear to me that he is going to be able to stay off immunosuppression.  If his breathing continues to decline or if his skin changes become more active than I think we will have to move to methotrexate.  We will assess this in 1 month

## 2017-11-04 NOTE — Assessment & Plan Note (Signed)
He has noted a pressure sore on his right hip.  He also has a healing lesion on his right lateral abdomen.  We talked about ways to keep pressure off the hip, some simple wound care strategies but I think he needs to go to the wound care clinic to have it looked at.  I will refer him today.

## 2017-11-12 ENCOUNTER — Telehealth: Payer: Self-pay | Admitting: Emergency Medicine

## 2017-11-12 NOTE — Telephone Encounter (Signed)
Called and spoke with patient regarding oxycodone rx refill. Advised pt that refill is not due until after 11/17/2017. Pt verbalized understanding and had no further concerns. Nothing further needed at this time.

## 2017-11-17 ENCOUNTER — Telehealth: Payer: Self-pay | Admitting: Emergency Medicine

## 2017-11-17 MED ORDER — OXYCODONE-ACETAMINOPHEN 10-325 MG PO TABS: 1.0000 | ORAL_TABLET | Freq: Three times a day (TID) | ORAL | 0 refills | Status: DC

## 2017-11-17 NOTE — Telephone Encounter (Signed)
Yes I agreed to fill it through September for him. Please remind him that he needs to get a PCP so it can be filled by them thereafter. Thanks

## 2017-11-17 NOTE — Telephone Encounter (Signed)
Pt is requesting a refill of his Oxycodone 10/325mg . Last refill: 10/17/17 #90 with 0 refills, take 1 tab TID.  RB please advise if ok to refill.  Thanks!

## 2017-11-17 NOTE — Telephone Encounter (Signed)
Okay 

## 2017-11-17 NOTE — Telephone Encounter (Signed)
Rx has been printed and placed in brown accordion folder for pickup.  Reminded pt of PCP recommendations.  Nothing further needed at this time.

## 2017-11-17 NOTE — Telephone Encounter (Signed)
Routing to VS (dod) to see if he is willing to sign rx for pt since RB is working in the hospital today.  VS please advise.  Thank you!

## 2017-12-05 ENCOUNTER — Encounter: Payer: Self-pay | Admitting: Pulmonary Disease

## 2017-12-05 ENCOUNTER — Ambulatory Visit (INDEPENDENT_AMBULATORY_CARE_PROVIDER_SITE_OTHER): Payer: BLUE CROSS/BLUE SHIELD | Admitting: Pulmonary Disease

## 2017-12-05 VITALS — BP 142/84 | HR 93 | Ht 68.0 in | Wt >= 6400 oz

## 2017-12-05 DIAGNOSIS — M25561 Pain in right knee: Secondary | ICD-10-CM

## 2017-12-05 DIAGNOSIS — R52 Pain, unspecified: Secondary | ICD-10-CM | POA: Diagnosis not present

## 2017-12-05 DIAGNOSIS — D862 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: Secondary | ICD-10-CM | POA: Diagnosis not present

## 2017-12-05 DIAGNOSIS — M25562 Pain in left knee: Secondary | ICD-10-CM

## 2017-12-05 DIAGNOSIS — G4733 Obstructive sleep apnea (adult) (pediatric): Secondary | ICD-10-CM | POA: Diagnosis not present

## 2017-12-05 DIAGNOSIS — M25569 Pain in unspecified knee: Secondary | ICD-10-CM | POA: Insufficient documentation

## 2017-12-05 DIAGNOSIS — G894 Chronic pain syndrome: Secondary | ICD-10-CM

## 2017-12-05 DIAGNOSIS — G8929 Other chronic pain: Secondary | ICD-10-CM

## 2017-12-05 MED ORDER — PREDNISONE 10 MG PO TABS: ORAL_TABLET | ORAL | 0 refills | Status: DC

## 2017-12-05 NOTE — Patient Instructions (Addendum)
Prednisone  >>> Prednisone 30 mg daily take in the morning with food >>> Continue this dose until you follow-up with our clinic  We will refer you to orthopedics for your knee pain  We will refer you to pain management for chronic pain  Continue follow-up with wound care next month  Follow-up in our office in 3 to 4 weeks to see how you are doing on this prednisone dose  Follow-up with our office sooner if symptoms are not improving   Please contact the office if your symptoms worsen or you have concerns that you are not improving.   Thank you for choosing [facility] for your healthcare, and for allowing [place] to partner with you on your healthcare journey. I am thankful to be able to provide care to you today.   Elisha Headland FNP-C

## 2017-12-05 NOTE — Assessment & Plan Note (Addendum)
Continue to work towards healthy weight Continue to follow-up with bariatrics once you have insurance coverage from disability paperwork

## 2017-12-05 NOTE — Assessment & Plan Note (Signed)
Prednisone  >>> Prednisone 30 mg daily take in the morning with food >>> Continue this dose until you follow-up with our clinic  Continue follow-up with wound care next month  Follow-up in our office in 3 to 4 weeks to see how you are doing on this prednisone dose

## 2017-12-05 NOTE — Assessment & Plan Note (Signed)
Referral to pain management today

## 2017-12-05 NOTE — Assessment & Plan Note (Signed)
Continue CPAP use . Keep up the hard work using your device.  . Do not drive or operate heavy machinery if tired or drowsy.  . Please notify the supply company and office if you are unable to use your device regularly due to missing supplies or machine being broken.  . Work on maintaining a healthy weight and following your recommended nutrition plan  . Maintain proper daily exercise and movement  . Maintaining proper use of your device can also help improve management of other chronic illnesses such as: Blood pressure, blood sugars, and weight management.

## 2017-12-05 NOTE — Assessment & Plan Note (Signed)
Referral to orthopedics today

## 2017-12-05 NOTE — Progress Notes (Signed)
'@Patient'  ID: Anthony [name], male    DOB: [birth year], 37 y.o.   MRN: [id number]  Chief Complaint  Patient presents with  . Follow-up    RB pt, since being off prednisone feels breathing is worse, hands and knees are worse    Referring provider: No ref. provider found  HPI: 37 year old male patient.  Former smoker.  Obstructive sleep apnea CPAP.  Sarcoidosis.  Former smoker (quit 09/30/2016).  3 pack years.  Recent [facility] Encounters:   ROV 09/22/17 --patient has a history of sarcoidosis for which she has been treated in the past with prednisone.  He had bilateral hilar and mediastinal lymphadenopathy as well as some nodular pulmonary infiltrates.  At his visit in February based on clinical symptoms and his CT scan from January 2019 I have restarted his scheduled prednisone at 30 mg daily.  He had a repeat CT scan of the chest on 09/11/2017 that I reviewed.  This shows a market decrease in the size of his right perihilar pulmonary parenchymal nodules, persistent left upper lobe nodule with linear platelike scarring, some decrease in his mild mediastinal lymphadenopathy.  He underwent pulmonary function testing on 09/22/2017 that I have reviewed.  This shows evidence for probable restriction, little to support coexisting obstruction.  His diffusion capacity was elevated. He still needs to work on getting a pain clinic appointment. He is going to return to see a bariatric surgeon for his weight. He remains on CPAP, reliable with it. He does clinically benefit from it. He has albuterol, uses rarely.   ROV 11/04/17 --this is a follow-up visit for pleasant 37 year old gentleman with a history of obesity, sarcoidosis diagnosed by nodal biopsies and characterized by mediastinal lymphadenopathy with nodular pulmonary infiltrates. He also has rash and some nodular lesions on R > L hand.   We had put him on prednisone earlier in the year based on interval increase in the infiltrates on CT scan of  the chest.  He is currently weaned to off, has been off for about 2 weeks.  He has albuterol available for symptom management but has not been on a scheduled bronchodilator.  He uses CPAP reliably every night.  Plan: Refer to wound care clinic, continue CPAP, try to remain off prednisone for another month, if sarcoidosis becomes more active we will consider starting alternative medication like methotrexate or Imuran.  Tests:  09/22/2017-pulmonary function test- FVC 95, F/F ratio 84, FEV1 97, no significant bronchodilator change DLCO 174  Imaging:  09/10/2017-CT chest with contrast- market decrease in size of right perihilar pulmonary parenchymal nodules, and left upper lobe nodule with linear scarring  05/22/2017-CT chest without contrast- mediastinal and bilateral hilar adenopathy is decreased, scattered irregular nodular peribronchovascular fossae of consolidation in both lungs are stable to mildly decreased in size.   Cardiac:  02/11/2017-echocardiogram-LV ejection fraction 55 to 62%, systolic function is normal,  Labs:  03/17/2017-quant gold-negative 02/17/17>>>Labs showed elevated LDH at 295, ESR 29, ACE level 83, c-RP 55 (markedly elevated).   Micro:   Chart Review:    12/05/17 -OV Follow up  37 year old patient seen in office today for follow-up visit.  Patient reporting that shortness of breath has gotten worse since being seen in office last month.  Patient also reports that knee pain has gotten worse and he may have to start using a cane again.  Patient denies other respiratory symptoms besides shortness of breath.  Patient reports he has been using his CPAP nightly and that it has been going  well with no complications.  Patient has appointment with wound care in August/2019.  Of note patient does have continued right hand rash.  Patient says that this resolved initially when he was on prednisone in the past.  Patient is no longer pursuing bariatric surgery as he is waiting for his  disability/insurance to be settled.  When this occurs patient would like to proceed forward with bariatric surgery.  Patient says he should have information regarding disability coverage for the next week.   Allergies  Allergen Reactions  . Xarelto [Rivaroxaban]     Bleeding gums, weakness, eye swelling/bluring vision, intensified joint pain     There is no immunization history on file for this patient.  Past Medical History:  Diagnosis Date  . Anxiety   . Depression   . Dyspnea   . Eczema of hand   . Headache    hx of with tooth pain  . Obese   . Pneumonia    12/2016  . Pre-diabetes   . Sleep apnea    cpap    Tobacco History: Social History   Tobacco Use  Smoking Status Former Smoker  . Packs/day: 0.30  . Years: 10.00  . Pack years: 3.00  . Types: Cigarettes  . Last attempt to quit: 09/30/2016  . Years since quitting: 1.1  Smokeless Tobacco Never Used   Counseling given: Not Answered Continue not smoking.  Outpatient Encounter Medications as of 12/05/2017  Medication Sig  . albuterol (PROVENTIL HFA;VENTOLIN HFA) 108 (90 Base) MCG/ACT inhaler Inhale 2 puffs into the lungs every 4 (four) hours as needed for wheezing or shortness of breath (cough, shortness of breath or wheezing.).  Marland Kitchen Menthol, Topical Analgesic, (ICY HOT EX) Apply 1 application topically daily as needed (muscle pain).  . naproxen (NAPROSYN) 500 MG tablet Take 500 mg by mouth 2 (two) times daily with a meal.  . naproxen sodium (ANAPROX) 220 MG tablet Take 220-440 mg by mouth 2 (two) times daily. Take 440 mg in the morning an 220 mg in the evening  . oxyCODONE-acetaminophen (PERCOCET) 10-325 MG tablet Take 1 tablet by mouth 3 (three) times daily. pain  . predniSONE (DELTASONE) 10 MG tablet Take 3 tablets (30 mg total) daily in the morning with food   No facility-administered encounter medications on file as of 12/05/2017.      Review of Systems  Review of Systems  Constitutional: Negative for  activity change, chills, fatigue, fever and unexpected weight change.  HENT: Negative for postnasal drip, rhinorrhea, sinus pressure, sinus pain, sneezing and sore throat.   Respiratory: Positive for shortness of breath. Negative for cough and wheezing.   Cardiovascular: Negative for chest pain and palpitations.  Gastrointestinal: Negative for constipation, diarrhea, nausea and vomiting.  Genitourinary: Negative for hematuria and urgency.  Musculoskeletal: Positive for gait problem. Negative for arthralgias.       Knee pain   Skin: Positive for rash. Negative for color change.       Right hand peeling, minor on left finger tip   Neurological: Negative for dizziness, seizures and headaches.  Psychiatric/Behavioral: Negative for dysphoric mood. The patient is not nervous/anxious.   All other systems reviewed and are negative.     Physical Exam  BP (!) 142/84 (BP Location: Right Wrist, Cuff Size: Normal)   Pulse 93   Ht '5\' 8"'  (1.727 m)   Wt (!) 557 lb 12.8 oz (253 kg)   SpO2 97%   BMI 84.81 kg/m   Wt Readings from Last 5  Encounters:  12/05/17 (!) 557 lb 12.8 oz (253 kg)  11/04/17 (!) 557 lb (252.7 kg)  09/22/17 (!) 558 lb (253.1 kg)  06/19/17 (!) 528 lb (239.5 kg)  05/19/17 (!) 512 lb (232.2 kg)     Physical Exam  Constitutional: He is oriented to person, place, and time and well-developed, well-nourished, and in no distress. Vital signs are normal. No distress.  HENT:  Head: Normocephalic and atraumatic.  Right Ear: Hearing, tympanic membrane, external ear and ear canal normal.  Left Ear: Hearing, tympanic membrane, external ear and ear canal normal.  Mouth/Throat: Uvula is midline, oropharynx is clear and moist and mucous membranes are normal. No oropharyngeal exudate.  Mallampati 2  Eyes: Pupils are equal, round, and reactive to light.  Glasses  Neck: Normal range of motion. Neck supple. No JVD present.  Cardiovascular: Normal rate, regular rhythm and normal heart  sounds.  Pulmonary/Chest: Effort normal. No accessory muscle usage. No respiratory distress. He has decreased breath sounds. He has no wheezes. He has no rhonchi.  Diminished breath sounds throughout exam, air movement in all lobes  Abdominal: Soft. Bowel sounds are normal. There is no tenderness.  Musculoskeletal: Normal range of motion. He exhibits no edema.  Lymphadenopathy:    He has no cervical adenopathy.  Neurological: He is alert and oriented to person, place, and time. Gait normal.  Skin: Skin is warm, dry and intact. Rash (Peeling right hand rash) noted. He is not diaphoretic. No erythema.     Psychiatric: Mood, memory, affect and judgment normal.  Nursing note and vitals reviewed.    Lab Results:  CBC    Component Value Date/Time   WBC 6.7 02/17/2017 1111   WBC 7.1 01/17/2017 1025   RBC 5.04 02/17/2017 1111   RBC 5.27 01/17/2017 1025   HGB 14.6 02/17/2017 1111   HCT 44.3 02/17/2017 1111   PLT 278 02/17/2017 1111   MCV 88.0 02/17/2017 1111   MCH 29.0 02/17/2017 1111   MCH 29.4 01/17/2017 1025   MCHC 33.0 02/17/2017 1111   MCHC 33.7 01/17/2017 1025   RDW 14.1 02/17/2017 1111   LYMPHSABS 1.5 02/17/2017 1111   MONOABS 1.0 (H) 02/17/2017 1111   EOSABS 0.4 02/17/2017 1111   BASOSABS 0.1 02/17/2017 1111    BMET    Component Value Date/Time   NA 138 02/17/2017 1111   K 4.2 02/17/2017 1111   CL 107 01/02/2017 1805   CO2 28 02/17/2017 1111   GLUCOSE 91 02/17/2017 1111   BUN 7.1 02/17/2017 1111   CREATININE 0.9 02/17/2017 1111   CALCIUM 9.6 02/17/2017 1111   GFRNONAA >60 01/02/2017 1805   GFRAA >60 01/02/2017 1805    BNP    Component Value Date/Time   BNP 5 02/03/2017 1009    ProBNP No results found for: PROBNP  Imaging: No results found.   Assessment & Plan:   Pleasant 37 year old patient seen office today.  Unfortunately shortness of breath continues to worsen.  This is multifactorial given patient's sarcoidosis, deconditioning, and morbid  obesity.  Discussed with patient as well as with Dr. Lamonte Sakai restarting 30 mg daily prednisone.  Will reevaluate patient in 3 weeks.  If patient is still needing prednisone then we will transition to methotrexate.  We will refer to orthopedics for chronic knee pain.  Encourage patient to keep follow-up with wound care next month.  We will also refer to pain management for chronic pain.  As soon as disability paperwork is completed patient to proceed forward with meeting  with bariatrics.   Obstructive sleep apnea Continue CPAP use . Keep up the hard work using your device.  . Do not drive or operate heavy machinery if tired or drowsy.  . Please notify the supply company and office if you are unable to use your device regularly due to missing supplies or machine being broken.  . Work on maintaining a healthy weight and following your recommended nutrition plan  . Maintain proper daily exercise and movement  . Maintaining proper use of your device can also help improve management of other chronic illnesses such as: Blood pressure, blood sugars, and weight management.     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) Prednisone  >>> Prednisone 30 mg daily take in the morning with food >>> Continue this dose until you follow-up with our clinic  Continue follow-up with wound care next month  Follow-up in our office in 3 to 4 weeks to see how you are [name] on this prednisone dose  Morbid obesity ([place]) Continue to work towards healthy weight Continue to follow-up with bariatrics once you have insurance coverage from disability paperwork  Chronic pain Referral to pain management today  Knee pain Referral to orthopedics today   This appointment was 42 minutes along with her 50% of that time in direct face-to-face patient care, assessment, interview, plan of care discussion, follow-up.  I also discussed this case with Dr. Lamonte Sakai.  Lauraine Rinne, NP 12/05/2017

## 2017-12-15 ENCOUNTER — Telehealth: Payer: Self-pay | Admitting: Emergency Medicine

## 2017-12-15 MED ORDER — OXYCODONE-ACETAMINOPHEN 10-325 MG PO TABS: 1.0000 | ORAL_TABLET | Freq: Three times a day (TID) | ORAL | 0 refills | Status: DC

## 2017-12-15 NOTE — Telephone Encounter (Signed)
He has been referred to pain management several times before. Can you find out what is the status of the referral?  Ok to give for 2 weeks while he is travelling. This would be the last prescription of pain meds from this office.

## 2017-12-15 NOTE — Telephone Encounter (Signed)
Spoke with pt. He is aware that his prescription is ready. Nothing further was needed.

## 2017-12-15 NOTE — Telephone Encounter (Signed)
Per a telephone encounter dated 08/15/17, RB said that he would refill this medication for 4 months for him. This will be the last prescription that the pt will be given.  Spoke with pt. He states that he would like the prescription today as he is going to be leaving for [place] tomorrow.  Dr. Isaiah Serge - please advise if you are okay with signing this prescription as RB is not in the office today. Thanks.

## 2017-12-16 ENCOUNTER — Telehealth: Payer: Self-pay | Admitting: Emergency Medicine

## 2017-12-16 NOTE — Telephone Encounter (Signed)
Spoke with patient. He wanted to know why he only received 42 Percocet tablets. Explained to him that Dr. Isaiah Serge signed his RX since RB was not in the office. And since he has been referred to pain management, Dr. Isaiah Serge only allowed him enough to last for 2 weeks. The rest will need to come from pain management.   Patient verbalized understanding. Nothing further needed at time of call.

## 2017-12-17 ENCOUNTER — Encounter (HOSPITAL_BASED_OUTPATIENT_CLINIC_OR_DEPARTMENT_OTHER): Payer: BLUE CROSS/BLUE SHIELD | Attending: Internal Medicine

## 2017-12-29 ENCOUNTER — Telehealth: Payer: Self-pay | Admitting: Emergency Medicine

## 2017-12-29 DIAGNOSIS — D862 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: Secondary | ICD-10-CM

## 2017-12-29 NOTE — Telephone Encounter (Signed)
Patient is in the lobby and wanted to speak to someone about medication.

## 2017-12-29 NOTE — Telephone Encounter (Signed)
Called and spoke with patient regarding refill request Pt is requesting refill for prednisone that was prescribed by B.Mack on 12/05/2017 Pt advised he lost the rx of prednisone 10mg , taking 3 tabs QD; he advised currently not taking Pt would like another script of prednisone to be prescribed to [facility] as he lost his original rx.  Pt also advised that his rx for Percocet 10-325mg  is needing refilled until end of Sept 2019 for #90 tabs Pt has been calling in monthly for refill on Percocet 10-325mg  for #90 tabs On 08/28/17 RB scripted Percocet #90 tabs On 09/17/17 BQ scripted Percocet #90 tabs On 10/17/17 MW scripted Percocet #90 tabs On 11/17/17 VS scripted Percocet #90 tabs On 12/15/17 PM scripted Percocet #42 tabs for a 2 weeks supply to have pt seek pain management. Pt is requesting refill of Percocet today for remaining amount to last until end of September 2019.  Called and spoke with October 2019 with [facility] this morning regarding this medication She advised pt filled the rx of prednisone 10mg  #90 tabs on 12/05/2017 She advised pt has filled percocet #90 monthly since 08/28/17   RB please advise.

## 2017-12-29 NOTE — Telephone Encounter (Signed)
Spoke with patient in lobby regarding his refill request on percocet #90 tabs and prednisone Advised patient that RB is out off the office, and will provide an answer later tomorrow Pt advised that he has to have these medications tomorrow as he will be out of the percocet and lost the prednisone.  RB please advise and review below first message today. Thank you.

## 2017-12-30 MED ORDER — PREDNISONE 10 MG PO TABS: ORAL_TABLET | ORAL | 0 refills | Status: DC

## 2017-12-30 NOTE — Telephone Encounter (Signed)
Pt is returning call. Cb is [phone].

## 2017-12-30 NOTE — Telephone Encounter (Signed)
Attempted to call pt. I did not receive an answer. I have left a message for pt to return our call.  

## 2017-12-30 NOTE — Telephone Encounter (Signed)
Pt is returning call. Pt is very upset that he has not been called back with the refill. Pt states that he is out of both Prednisone and Percocet. Cb is [phone]

## 2017-12-30 NOTE — Telephone Encounter (Signed)
I spoke with Anthony [name] about this pt yesterday and he was fine with refilling the pt's prednisone. The Percocet prescription will need to be decided by RB.  I spoke with the pt and made him aware of this. Rx for prednisone has been sent in.  Will route message to myself for follow up tomorrow with RB.

## 2017-12-31 MED ORDER — OXYCODONE-ACETAMINOPHEN 10-325 MG PO TABS: 1.0000 | ORAL_TABLET | Freq: Three times a day (TID) | ORAL | 0 refills | Status: DC

## 2017-12-31 NOTE — Telephone Encounter (Signed)
Patient returned phone call; pt contact # [phone]

## 2017-12-31 NOTE — Telephone Encounter (Signed)
Called spoke with patient who reported he was just checking on the Percocet Rx. Advised patient that we are still waiting on RB's response if he will refill the Percocet. Patient voiced his understanding. Will route back to Dr Delton Coombes to await his decision.

## 2017-12-31 NOTE — Telephone Encounter (Signed)
Pt is calling back in regards to the percocet Rx. Cb [phone].

## 2017-12-31 NOTE — Telephone Encounter (Signed)
Per RB >> okay refill for #45 tablets and this will be the last prescription for Percocet.  Spoke with pt. He has been made aware of RB's response. Rx has been printed, signed and placed up front for pick up. Nothing further was needed.

## 2017-12-31 NOTE — Telephone Encounter (Signed)
I will fill for the rest of this month. I cannot fill it as we go forward. He will need find an alternative source, pain center, etc.

## 2017-12-31 NOTE — Telephone Encounter (Signed)
Spoke with pt, advised him that he has an appt on Friday and the phone tree must have called him to remind him of his appt. Weare still awaiting RB response. Pt understood.

## 2018-01-02 ENCOUNTER — Ambulatory Visit: Payer: BLUE CROSS/BLUE SHIELD | Admitting: Emergency Medicine

## 2018-01-02 ENCOUNTER — Other Ambulatory Visit (INDEPENDENT_AMBULATORY_CARE_PROVIDER_SITE_OTHER): Payer: BLUE CROSS/BLUE SHIELD

## 2018-01-02 ENCOUNTER — Encounter: Payer: Self-pay | Admitting: Emergency Medicine

## 2018-01-02 VITALS — BP 140/84 | HR 96 | Ht 68.0 in | Wt >= 6400 oz

## 2018-01-02 DIAGNOSIS — D869 Sarcoidosis, unspecified: Secondary | ICD-10-CM | POA: Diagnosis not present

## 2018-01-02 DIAGNOSIS — D862 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: Secondary | ICD-10-CM

## 2018-01-02 DIAGNOSIS — G4733 Obstructive sleep apnea (adult) (pediatric): Secondary | ICD-10-CM | POA: Diagnosis not present

## 2018-01-02 LAB — CBC WITH DIFFERENTIAL/PLATELET
BASOS PCT: 0.6 % (ref 0.0–3.0)
Basophils Absolute: 0.1 10*3/uL (ref 0.0–0.1)
EOS ABS: 0 10*3/uL (ref 0.0–0.7)
Eosinophils Relative: 0.2 % (ref 0.0–5.0)
HCT: 46.4 % (ref 39.0–52.0)
Hemoglobin: 15.3 g/dL (ref 13.0–17.0)
LYMPHS ABS: 1 10*3/uL (ref 0.7–4.0)
Lymphocytes Relative: 8.4 % — ABNORMAL LOW (ref 12.0–46.0)
MCHC: 32.9 g/dL (ref 30.0–36.0)
MCV: 93 fl (ref 78.0–100.0)
MONO ABS: 0.3 10*3/uL (ref 0.1–1.0)
Monocytes Relative: 2.7 % — ABNORMAL LOW (ref 3.0–12.0)
NEUTROS ABS: 10.1 10*3/uL — AB (ref 1.4–7.7)
Neutrophils Relative %: 88.1 % — ABNORMAL HIGH (ref 43.0–77.0)
PLATELETS: 296 10*3/uL (ref 150.0–400.0)
RBC: 4.98 Mil/uL (ref 4.22–5.81)
RDW: 14 % (ref 11.5–15.5)
WBC: 11.5 10*3/uL — ABNORMAL HIGH (ref 4.0–10.5)

## 2018-01-02 LAB — HEPATIC FUNCTION PANEL
ALT: 20 U/L (ref 0–53)
AST: 12 U/L (ref 0–37)
Albumin: 4.2 g/dL (ref 3.5–5.2)
Alkaline Phosphatase: 56 U/L (ref 39–117)
BILIRUBIN TOTAL: 0.5 mg/dL (ref 0.2–1.2)
Bilirubin, Direct: 0.1 mg/dL (ref 0.0–0.3)
TOTAL PROTEIN: 7.9 g/dL (ref 6.0–8.3)

## 2018-01-02 LAB — BASIC METABOLIC PANEL
BUN: 12 mg/dL (ref 6–23)
CHLORIDE: 101 meq/L (ref 96–112)
CO2: 32 mEq/L (ref 19–32)
Calcium: 9.7 mg/dL (ref 8.4–10.5)
Creatinine, Ser: 1.09 mg/dL (ref 0.40–1.50)
GFR: 98.42 mL/min (ref >60.00)
GLUCOSE: 135 mg/dL — AB (ref 70–99)
POTASSIUM: 4.2 meq/L (ref 3.5–5.1)
Sodium: 138 mEq/L (ref 135–145)

## 2018-01-02 NOTE — Patient Instructions (Signed)
Please continue prednisone 30 mg daily for now. We will check lab work today Follow-up in 2 to 3 weeks.  At that time if your lab work is normal then we will initiate methotrexate for sarcoidosis control. Please continue your CPAP every night as you have been using it.

## 2018-01-02 NOTE — Progress Notes (Signed)
Subjective:    Patient ID: Anthony [name], male    DOB: 1980/07/07, 37 y.o.   MRN: [id number]  HPI  37 year old overweight gentleman with a minimal tobacco history, OSA on CPAP, who is referred from Dr Lebron Conners for evaluation of bilateral nodular infiltrates and mediastinal/hilar lymphadenopathy. He was experiencing SOB which was treated 12/2016 as possible pneumonia. The SOB persisted and he developed cough, ultimately a CT chest was done on 02/05/17 that I have reviewed. This shows lateral nodular infiltrates, central in nature, also with significant mediastinal and hilar lymphadenopathy. He was seen by Dr. Lebron Conners with oncology to further evaluate. Referred now to pursue tissue diagnosis.   He has been experiencing subjective fevers, sweats and decreased appetite. He has exertional dyspnea, especially with stairs. He had some eye swelling that has improved.   Labs showed elevated LDH at 295, ESR 29, ACE level 83, c-RP 55 (markedly elevated).   ROV 04/10/17 --follow-up visit for patient with a history of obstructive sleep apnea and bilateral nodular infiltrates with mediastinal lymphadenopathy.  He underwent endobronchial ultrasound and nodal biopsies on 03/04/17.  Nodal biopsies consistent with granulomatous inflammation.  AFB and fungal studies are negative. Quant gold negative.   ROV 05/19/17 --follow-up visit for patient with history of obesity, obstructive sleep apnea, sarcoidosis based on endobronchial ultrasound and nodal biopsies.  Cultures are all negative.  We started him on prednisone at his last visit. He believes that his breathing has benefited. His rash on his hands is clearing up.   ROV 06/19/17 --patient is 37 years old, has a history of sarcoidosis.  We treated him with an extended course of prednisone.  Last visit 1 month ago we started to taper with a plan to come off completely.  CT chest was done on 05/22/17 that I have reviewed.  This shows improvement in his bilateral hilar and  mediastinal lymphadenopathy, possibly some mild decrease in his nodular pulmonary infiltrates although they look mostly unchanged. Since he came off the prednisone his breathing has become more difficult, trouble with walking, trouble with stairs. He had been able to shop at the store before the pred was stopped. He tried albuterol - no real change. He has had some increased cough, his wife has heard some wheeze.   ROV 09/22/17 --patient has a history of sarcoidosis for which she has been treated in the past with prednisone.  He had bilateral hilar and mediastinal lymphadenopathy as well as some nodular pulmonary infiltrates.  At his visit in February based on clinical symptoms and his CT scan from January 2019 I have restarted his scheduled prednisone at 30 mg daily.  He had a repeat CT scan of the chest on 09/11/2017 that I reviewed.  This shows a market decrease in the size of his right perihilar pulmonary parenchymal nodules, persistent left upper lobe nodule with linear platelike scarring, some decrease in his mild mediastinal lymphadenopathy.  He underwent pulmonary function testing on 09/22/2017 that I have reviewed.  This shows evidence for probable restriction, little to support coexisting obstruction.  His diffusion capacity was elevated. He still needs to work on getting a pain clinic appointment. He is going to return to see a bariatric surgeon for his weight. He remains on CPAP, reliable with it. He does clinically benefit from it. He has albuterol, uses rarely.   ROV 11/04/17 --this is a follow-up visit for pleasant 37 year old gentleman with a history of obesity, sarcoidosis diagnosed by nodal biopsies and characterized by mediastinal lymphadenopathy with nodular  pulmonary infiltrates. He also has rash and some nodular lesions on R > L hand.   We had put him on prednisone earlier in the year based on interval increase in the infiltrates on CT scan of the chest.  He is currently weaned to off, has been  off for about 2 weeks.  He has albuterol available for symptom management but has not been on a scheduled bronchodilator.  He uses CPAP reliably every night.  ROV 01/02/18 --37 year old gentleman with obesity, OSA/OHS, sarcoidosis that we diagnosed by nodal biopsies.  At the time of diagnosis he had midsternal lymphadenopathy and nodular bilateral pulmonary infiltrates, rash on both hands.  I have weaned him off prednisone after a flare and recurrence of infiltrates at the beginning of the year.  He has developed progressive dyspnea and this prompted [place] to restart prednisone 30 mg daily 12/05/2017.  He is been dealing with chronic osteoarthritis especially of his knees.  He is not been able to get into the pain clinic and so I have been prescribing his Percocet temporarily.  His exertional SOB is better, joint pain is better. He hasn't needed any albuterol lately.  Good compliance with his CPAP.     Review of Systems  Constitutional: Positive for appetite change and unexpected weight change. Negative for fever.  HENT: Positive for congestion and dental problem. Negative for ear pain, nosebleeds, postnasal drip, rhinorrhea, sinus pressure, sneezing, sore throat and trouble swallowing.   Eyes: Negative for redness and itching.  Respiratory: Positive for cough and shortness of breath. Negative for chest tightness and wheezing.   Cardiovascular: Positive for chest pain. Negative for palpitations and leg swelling.  Gastrointestinal: Positive for abdominal pain. Negative for nausea and vomiting.  Genitourinary: Negative for dysuria.  Musculoskeletal: Negative for joint swelling.  Skin: Negative for rash.  Neurological: Positive for headaches.  Hematological: Does not bruise/bleed easily.  Psychiatric/Behavioral: Positive for dysphoric mood. The patient is nervous/anxious.      Objective:   Physical Exam Vitals:   01/02/18 1557  BP: 140/84  Pulse: 96  SpO2: 98%  Weight: (!) 556 lb (252.2 kg)    Height: '5\' 8"'  (1.727 m)   Gen: Pleasant, obese man, in no distress,  normal affect  ENT: No lesions,  mouth clear,  oropharynx clear, no postnasal drip  Neck: No JVD, no stridor  Lungs: No use of accessory muscles, clear without rales or rhonchi  Cardiovascular: RRR, heart sounds normal, no murmur or gallops, no peripheral edema  Musculoskeletal: No deformities, no cyanosis or clubbing  Neuro: alert, non focal  Skin: Warm, healing patchy dryness on his R hand.     CT chest 09/11/17 --  COMPARISON:  CT 05/22/2017  FINDINGS: Cardiovascular: No significant vascular findings. Normal heart size. No pericardial effusion.  Mediastinum/Nodes: No axillary supraclavicular adenopathy. Mild mediastinal adenopathy similar. For example RIGHT paratracheal lymph node measures 12 mm short axis compared to 14 mm remeasured. Prevascular lymph node measures 7 mm compared with 8 mm. No new or enlarged mediastinal lymph nodes. No hilar adenopathy.  Esophagus normal.  No pericardial effusion  Lungs/Pleura: Pulmonary nodules have decreased in size compared to prior. For example LEFT upper lobe nodule presents as thin linear scarring measuring 6 mm x 12 mm decreased from nodule lesion measuring 25 x 18 mm (image 39/5).  Large perihilar nodules in the RIGHT lower lobe of also decreased in size with platelike linear scarring remaining. Example RIGHT upper lobe nodule/linear scarring measuring 27 x 19 mm decreased from  38 x 30 mm. Similar findings in the RIGHT lower lobe. No new nodularity. Airways are normal.  Upper Abdomen: Limited view of the liver, kidneys, pancreas are unremarkable. Normal adrenal glands.  Musculoskeletal: No aggressive osseous lesion.  IMPRESSION: 1. Marked decrease in size of RIGHT perihilar pulmonary parenchymal nodules and LEFT upper lobe nodule with linear platelike scarring remaining. 2. 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  I will determine the timing of a repeat CT chest as we go forward, probably after he is been on some maintenance methotrexate.  Please continue prednisone 30 mg daily for now. We will check lab work today Follow-up in 2 to 3 weeks.  At that time if your lab work is normal then we will initiate methotrexate for sarcoidosis control.   Obstructive sleep apnea Please continue your CPAP every night as you have been using it  Baltazar Apo, MD, PhD 01/02/2018, 4:21 PM [facility] [phone] or if no answer [phone]

## 2018-01-02 NOTE — Assessment & Plan Note (Signed)
Clinically improved since prednisone was reinitiated.  I will determine the timing of a repeat CT chest as we go forward, probably after he is been on some maintenance methotrexate.  Please continue prednisone 30 mg daily for now. We will check lab work today Follow-up in 2 to 3 weeks.  At that time if your lab work is normal then we will initiate methotrexate for sarcoidosis control.

## 2018-01-02 NOTE — Assessment & Plan Note (Signed)
Please continue your CPAP every night as you have been using it

## 2018-01-19 ENCOUNTER — Ambulatory Visit: Payer: BLUE CROSS/BLUE SHIELD | Admitting: Emergency Medicine

## 2018-01-23 ENCOUNTER — Telehealth: Payer: Self-pay | Admitting: Pulmonary Disease

## 2018-01-23 ENCOUNTER — Ambulatory Visit (INDEPENDENT_AMBULATORY_CARE_PROVIDER_SITE_OTHER): Payer: BLUE CROSS/BLUE SHIELD | Admitting: Pulmonary Disease

## 2018-01-23 ENCOUNTER — Other Ambulatory Visit (INDEPENDENT_AMBULATORY_CARE_PROVIDER_SITE_OTHER): Payer: BLUE CROSS/BLUE SHIELD

## 2018-01-23 ENCOUNTER — Ambulatory Visit: Payer: BLUE CROSS/BLUE SHIELD | Admitting: Emergency Medicine

## 2018-01-23 ENCOUNTER — Ambulatory Visit (INDEPENDENT_AMBULATORY_CARE_PROVIDER_SITE_OTHER)
Admission: RE | Admit: 2018-01-23 | Discharge: 2018-01-23 | Disposition: A | Discharge status: Home / Self Care | Payer: BLUE CROSS/BLUE SHIELD | Source: Ambulatory Visit | Attending: Pulmonary Disease | Admitting: Pulmonary Disease

## 2018-01-23 ENCOUNTER — Encounter: Payer: Self-pay | Admitting: Pulmonary Disease

## 2018-01-23 VITALS — BP 142/74 | HR 71 | Ht 66.1 in | Wt >= 6400 oz

## 2018-01-23 DIAGNOSIS — G894 Chronic pain syndrome: Secondary | ICD-10-CM | POA: Diagnosis not present

## 2018-01-23 DIAGNOSIS — D862 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: Secondary | ICD-10-CM

## 2018-01-23 DIAGNOSIS — M25561 Pain in right knee: Secondary | ICD-10-CM | POA: Diagnosis not present

## 2018-01-23 DIAGNOSIS — R918 Other nonspecific abnormal finding of lung field: Secondary | ICD-10-CM

## 2018-01-23 DIAGNOSIS — M25562 Pain in left knee: Secondary | ICD-10-CM

## 2018-01-23 DIAGNOSIS — R0989 Other specified symptoms and signs involving the circulatory and respiratory systems: Secondary | ICD-10-CM | POA: Diagnosis not present

## 2018-01-23 DIAGNOSIS — G8929 Other chronic pain: Secondary | ICD-10-CM

## 2018-01-23 LAB — CBC WITH DIFFERENTIAL/PLATELET
Basophils Absolute: 0.1 10*3/uL (ref 0.0–0.1)
Basophils Relative: 0.9 % (ref 0.0–3.0)
EOS ABS: 0 10*3/uL (ref 0.0–0.7)
EOS PCT: 0.5 % (ref 0.0–5.0)
HCT: 45.7 % (ref 39.0–52.0)
HEMOGLOBIN: 15.6 g/dL (ref 13.0–17.0)
Lymphocytes Relative: 13 % (ref 12.0–46.0)
Lymphs Abs: 1.2 10*3/uL (ref 0.7–4.0)
MCHC: 34.2 g/dL (ref 30.0–36.0)
MCV: 91.6 fl (ref 78.0–100.0)
MONO ABS: 0.3 10*3/uL (ref 0.1–1.0)
Monocytes Relative: 2.7 % — ABNORMAL LOW (ref 3.0–12.0)
Neutro Abs: 7.7 10*3/uL (ref 1.4–7.7)
Neutrophils Relative %: 82.9 % — ABNORMAL HIGH (ref 43.0–77.0)
Platelets: 295 10*3/uL (ref 150.0–400.0)
RBC: 4.99 Mil/uL (ref 4.22–5.81)
RDW: 14.1 % (ref 11.5–15.5)
WBC: 9.3 10*3/uL (ref 4.0–10.5)

## 2018-01-23 LAB — COMPREHENSIVE METABOLIC PANEL
ALBUMIN: 4.1 g/dL (ref 3.5–5.2)
ALK PHOS: 54 U/L (ref 39–117)
ALT: 19 U/L (ref 0–53)
AST: 12 U/L (ref 0–37)
BUN: 10 mg/dL (ref 6–23)
CALCIUM: 9.5 mg/dL (ref 8.4–10.5)
CO2: 28 mEq/L (ref 19–32)
Chloride: 103 mEq/L (ref 96–112)
Creatinine, Ser: 0.98 mg/dL (ref 0.40–1.50)
GFR: 111.23 mL/min (ref >60.00)
Glucose, Bld: 120 mg/dL — ABNORMAL HIGH (ref 70–99)
POTASSIUM: 4.1 meq/L (ref 3.5–5.1)
SODIUM: 141 meq/L (ref 135–145)
Total Bilirubin: 0.5 mg/dL (ref 0.2–1.2)
Total Protein: 7.7 g/dL (ref 6.0–8.3)

## 2018-01-23 LAB — HEPATIC FUNCTION PANEL
ALT: 19 U/L (ref 0–53)
AST: 12 U/L (ref 0–37)
Albumin: 4.1 g/dL (ref 3.5–5.2)
Alkaline Phosphatase: 54 U/L (ref 39–117)
BILIRUBIN DIRECT: 0.1 mg/dL (ref 0.0–0.3)
BILIRUBIN TOTAL: 0.5 mg/dL (ref 0.2–1.2)
Total Protein: 7.7 g/dL (ref 6.0–8.3)

## 2018-01-23 MED ORDER — FOLIC ACID 1 MG PO TABS: 1.0000 mg | ORAL_TABLET | Freq: Every day | ORAL | 6 refills | Status: DC

## 2018-01-23 MED ORDER — METHOTREXATE 2.5 MG PO TABS: 5.0000 mg | ORAL_TABLET | ORAL | 0 refills | Status: DC

## 2018-01-23 NOTE — Progress Notes (Signed)
Spoke with pt and notified of results per Brian Mack, NP. Pt verbalized understanding and denied any questions. 

## 2018-01-23 NOTE — Assessment & Plan Note (Signed)
Reconcile finances with [facility] orthopedics to resume treatment there  We will not prescribe narcotic medications in this office at this time

## 2018-01-23 NOTE — Progress Notes (Signed)
Your lab work is come back.  Lab work is stable.  Proceed forward with starting methotrexate.  Chest x-ray is stable with no acute changes.  Remember to take methotrexate weekly, folic acid daily, avoid drinking alcohol when on methotrexate.  Return back in 4 weeks with lab work.  Elisha Headland, FNP

## 2018-01-23 NOTE — Progress Notes (Signed)
'@Patient'  ID: Anthony [name], male    DOB: [birth year], 37 y.o.   MRN: [id number]  Chief Complaint  Patient presents with  . Follow-up    Sarcoidosis follow-up    Referring provider: No ref. provider found  HPI:  37 year old male patient followed in our office for sarcoidosis.  PMH: Morbid obesity, knee pain, chronic pain management Smoker/ Smoking History: Former smoker, 3 pack years Maintenance: Prednisone 30 mg daily Pt of: Dr. Lamonte Sakai   01/23/2018  - Visit   37 year old arriving today for follow-up appointment.  Patient was late to office so was unable to see Dr. Lamonte Sakai but was able to be rescheduled with myself.  Patient reports is a 2-week follow-up to discuss transitioning from prednisone a chronic alternative such as methotrexate.  Patient is currently on 30 mg of prednisone daily this is to manage his sarcoidosis.  Patient is requesting a refill of his Percocet pain medications as he has not been established with pain management yet.  Patient reports that he needs to pay a bill in order to be seen at [facility] orthopedics to resume treatment there.  Patient reports he has not been able to do that yet.  His patient is disabled and unable to work. Patient also reports that he has not followed up and establish with primary care as previously instructed.   Tests:   09/22/2017-pulmonary function test- FVC 95, F/F ratio 84, FEV1 97, no significant bronchodilator change DLCO 174  Imaging:  09/10/2017-CT chest with contrast- market decrease in size of right perihilar pulmonary parenchymal nodules, and left upper lobe nodule with linear scarring  05/22/2017-CT chest without contrast- mediastinal and bilateral hilar adenopathy is decreased, scattered irregular nodular peribronchovascular fossae of consolidation in both lungs are stable to mildly decreased in size.   Cardiac:  02/11/2017-echocardiogram-LV ejection fraction 55 to 08%, systolic function is normal,  Labs:  03/17/2017-quant  gold-negative 02/17/17>>>Labs showed elevated LDH at 295, ESR 29, ACE level 83, c-RP 55 (markedly elevated).  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 Xarelto [Rivaroxaban]     Bleeding gums, weakness, eye swelling/bluring vision, intensified joint pain    There is no immunization history on file for this patient.  Past Medical History:  Diagnosis Date  . Anxiety   . Depression   . Dyspnea   . Eczema of hand   . Headache    hx of with tooth pain  . Obese   . Pneumonia    12/2016  . Pre-diabetes   . Sleep apnea    cpap    Tobacco History: Social History   Tobacco Use  Smoking Status Former Smoker  . Packs/day: 0.30  . Years: 10.00  . Pack years: 3.00  . Types: Cigarettes  . Last attempt to quit: 09/30/2016  . Years since quitting: 1.3  Smokeless Tobacco Never Used   Counseling given: Not Answered Continue not smoking  Outpatient Encounter Medications as of 01/23/2018  Medication Sig  . albuterol (PROVENTIL HFA;VENTOLIN HFA) 108 (90 Base) MCG/ACT inhaler Inhale 2 puffs into the lungs every 4 (four) hours as needed for wheezing or shortness of breath (cough,  shortness of breath or wheezing.).  Marland Kitchen Menthol, Topical Analgesic, (ICY HOT EX) Apply 1 application topically daily as needed (muscle pain).  . naproxen (NAPROSYN) 500 MG tablet Take 500 mg by mouth 2 (two) times daily with a meal.  . naproxen sodium (ANAPROX) 220 MG tablet Take 220-440 mg by mouth 2 (two) times daily. Take 440 mg in the morning an 220 mg in the evening  . oxyCODONE-acetaminophen (PERCOCET) 10-325 MG tablet Take 1 tablet by mouth 3 (three) times daily. pain  .  predniSONE (DELTASONE) 10 MG tablet Take 3 tablets (30 mg total) daily in the morning with food  . folic acid (FOLVITE) 1 MG tablet Take 1 tablet (1 mg total) by mouth daily.  . methotrexate (RHEUMATREX) 2.5 MG tablet Take 2 tablets (5 mg total) by mouth once a week. Caution:Chemotherapy. Protect from light.   No facility-administered encounter medications on file as of 01/23/2018.      Review of Systems  Review of Systems  Constitutional: Negative for activity change, chills, fatigue, fever and unexpected weight change.  HENT: Negative for postnasal drip, rhinorrhea, sinus pressure, sinus pain, sneezing and sore throat.   Eyes: Negative.   Respiratory: Negative for cough, shortness of breath and wheezing.   Cardiovascular: Positive for leg swelling. Negative for chest pain and palpitations.  Gastrointestinal: Negative for constipation, diarrhea, nausea and vomiting.  Endocrine: Negative.   Musculoskeletal: Positive for back pain and gait problem.       Knee pain   Skin: Negative.   Neurological: Negative for dizziness and headaches.  Psychiatric/Behavioral: Negative.  Negative for dysphoric mood. The patient is not nervous/anxious.   All other systems reviewed and are negative.    Physical Exam  BP (!) 142/74 (BP Location: Right Wrist, Cuff Size: Normal)   Pulse 71   Ht 5' 6.1" (1.679 m)   Wt (!) 557 lb 12.8 oz (253 kg)   SpO2 97%   BMI 89.76 kg/m   Wt Readings from Last 5 Encounters:  01/23/18 (!) 557 lb 12.8 oz (253 kg)  01/02/18 (!) 556 lb (252.2 kg)  12/05/17 (!) 557 lb 12.8 oz (253 kg)  11/04/17 (!) 557 lb (252.7 kg)  09/22/17 (!) 558 lb (253.1 kg)    Physical Exam  Constitutional: He is oriented to person, place, and time and well-developed, well-nourished, and in no distress. No distress.  Morbidly obese  HENT:  Head: Normocephalic and atraumatic.  Right Ear: Hearing, tympanic membrane, external ear and ear canal normal.  Left Ear: Hearing, tympanic membrane  and external ear normal.  Nose: Nose normal. Right sinus exhibits no maxillary sinus tenderness and no frontal sinus tenderness. Left sinus exhibits no maxillary sinus tenderness and no frontal sinus tenderness.  Mouth/Throat: Uvula is midline and oropharynx is clear and moist. No oropharyngeal exudate.  Left ear canal erythematous  Eyes: Pupils are equal, round, and reactive to light.  Neck: Normal range of motion. Neck supple. No JVD present.  Cardiovascular: Normal rate, regular rhythm and normal heart sounds.  Pulmonary/Chest: Effort normal and breath sounds normal. No accessory muscle usage. No respiratory distress. He has no decreased breath sounds. He has no wheezes. He has no rhonchi.  Abdominal: Soft. Bowel sounds are normal. There is no tenderness.  Musculoskeletal: Normal range of motion. He exhibits no edema.  Lymphadenopathy:    He has no cervical adenopathy.  Neurological: He is alert and oriented to person, place, and time. Gait normal.  Skin: Skin is warm and dry. He is  not diaphoretic. No erythema.  Psychiatric: Mood, memory, affect and judgment normal.  Nursing note and vitals reviewed.    Lab Results:  CBC    Component Value Date/Time   WBC 11.5 (H) 01/02/2018 1630   RBC 4.98 01/02/2018 1630   HGB 15.3 01/02/2018 1630   HGB 14.6 02/17/2017 1111   HCT 46.4 01/02/2018 1630   HCT 44.3 02/17/2017 1111   PLT 296.0 01/02/2018 1630   PLT 278 02/17/2017 1111   MCV 93.0 01/02/2018 1630   MCV 88.0 02/17/2017 1111   MCH 29.0 02/17/2017 1111   MCH 29.4 01/17/2017 1025   MCHC 32.9 01/02/2018 1630   RDW 14.0 01/02/2018 1630   RDW 14.1 02/17/2017 1111   LYMPHSABS 1.0 01/02/2018 1630   LYMPHSABS 1.5 02/17/2017 1111   MONOABS 0.3 01/02/2018 1630   MONOABS 1.0 (H) 02/17/2017 1111   EOSABS 0.0 01/02/2018 1630   EOSABS 0.4 02/17/2017 1111   BASOSABS 0.1 01/02/2018 1630   BASOSABS 0.1 02/17/2017 1111    BMET    Component Value Date/Time   NA 138 01/02/2018 1630   NA  138 02/17/2017 1111   K 4.2 01/02/2018 1630   K 4.2 02/17/2017 1111   CL 101 01/02/2018 1630   CO2 32 01/02/2018 1630   CO2 28 02/17/2017 1111   GLUCOSE 135 (H) 01/02/2018 1630   GLUCOSE 91 02/17/2017 1111   BUN 12 01/02/2018 1630   BUN 7.1 02/17/2017 1111   CREATININE 1.09 01/02/2018 1630   CREATININE 0.9 02/17/2017 1111   CALCIUM 9.7 01/02/2018 1630   CALCIUM 9.6 02/17/2017 1111   GFRNONAA >60 01/02/2017 1805   GFRAA >60 01/02/2017 1805    BNP    Component Value Date/Time   BNP 5 02/03/2017 1009    ProBNP No results found for: PROBNP  Imaging: No results found.    Assessment & Plan:   38 year old patient seen today for follow-up visit.  Will start patient on methotrexate today.  Previous lab work is normal.  Will get chest x-ray and lab work as well today.  Patient needs to follow-up in 4 weeks.  With additional lab work at that time.  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[place]) Lab work today >>>CBC, c-Met, LFTs  Chest x-ray today  We will start methotrexate today Methotrexate 2.5 tablet  >>>Take 5 mg (2 tablets) weekly >>> In 2 weeks we will increase this to 7.5 mg (3 tablets) weekly  Stop using IcyHot   We will also add folic acid 1 mg to be taken daily  Follow-up with our office in 4 weeks with lab work   Morbid obesity ([place]) Work towards healthy weight  Knee pain Reconcile finances with [place] orthopedics to resume treatment there  We will not prescribe narcotic medications in this office at this time  Chronic pain Patient needs to be established with primary care to help with coordination of referrals/ chronic needs  We will not prescribe further narcotics at this time  Abnormal chest x-ray with multiple lung nodules Chest x-ray today     Lauraine Rinne, NP 01/23/2018

## 2018-01-23 NOTE — Patient Instructions (Addendum)
Lab work today >>>CBC, c-Met, LFTs  Chest x-ray today  We will start methotrexate today Methotrexate 2.5 tablet  >>>Take 5 mg (2 tablets) weekly >>> In 2 weeks we will increase this to 7.5 mg (3 tablets) weekly  Stop using IcyHot   We will also add folic acid 1 mg to be taken daily  Follow-up with our office in 4 weeks with lab work  Primary care referral  Financial Resources:  [place] [phone]   Patient Financial Assistance [phone]      It is flu season:   >>>Remember to be washing your hands regularly, using hand sanitizer, be careful to use around herself with has contact with people who are sick will increase her chances of getting sick yourself. >>> Best ways to protect herself from the flu: Receive the yearly flu vaccine, practice good hand hygiene washing with soap and also using hand sanitizer when available, eat a nutritious meals, get adequate rest, hydrate appropriately   Please contact the office if your symptoms worsen or you have concerns that you are not improving.   Thank you for choosing [facility] for your healthcare, and for allowing [place] to partner with you on your healthcare journey. I am thankful to be able to provide care to you today.   Wyn Quaker FNP-C

## 2018-01-23 NOTE — Assessment & Plan Note (Signed)
Work towards healthy weight   

## 2018-01-23 NOTE — Assessment & Plan Note (Signed)
Chest xray today.

## 2018-01-23 NOTE — Assessment & Plan Note (Signed)
Patient needs to be established with primary care to help with coordination of referrals/ chronic needs  We will not prescribe further narcotics at this time

## 2018-01-23 NOTE — Assessment & Plan Note (Signed)
Lab work today >>>CBC, c-Met, LFTs  Chest x-ray today  We will start methotrexate today Methotrexate 2.5 tablet  >>>Take 5 mg (2 tablets) weekly >>> In 2 weeks we will increase this to 7.5 mg (3 tablets) weekly  Stop using IcyHot   We will also add folic acid 1 mg to be taken daily  Follow-up with our office in 4 weeks with lab work

## 2018-01-23 NOTE — Telephone Encounter (Signed)
Notes recorded by Coral Ceo, NP on 01/23/2018 at 2:07 PM EDT Your lab work is come back. Lab work is stable. Proceed forward with starting methotrexate. Chest x-ray is stable with no acute changes.  Remember to take methotrexate weekly, folic acid daily, avoid drinking alcohol when on methotrexate.  Return back in 4 weeks with lab work.  Elisha Headland, FNP  Spoke with the pt and notified of results/recs  He verbalized understanding and denied any questions/concerns

## 2018-02-02 ENCOUNTER — Telehealth: Payer: Self-pay | Admitting: Pulmonary Disease

## 2018-02-02 NOTE — Telephone Encounter (Signed)
Patient can use Tylenol. Do not exceed more than 3000mg  daily.      Ibuprofen/NSAIDS can have interactions with methotrexate. We will monitor this with your monthly bloodwork as bother can affect your kidney functioning. Best for patient to avoid use of this.  As previously discussed with patient at multiple office visits he needs to follow-up with [facility] orthopedics/pain management for his chronic pain needs.  [facility] FNP

## 2018-02-02 NOTE — Telephone Encounter (Signed)
Patient is calling to ask what OTC medications he can take for pain he is worried about a interaction with his methotrexate.  Anthony [name] please advise.  Allergies as of 02/02/2018      Reactions   Xarelto [rivaroxaban]    Bleeding gums, weakness, eye swelling/bluring vision, intensified joint pain      Medication List        Accurate as of 02/02/18  5:02 PM. Always use your most recent med list.          albuterol 108 (90 Base) MCG/ACT inhaler Commonly known as:  PROVENTIL HFA;VENTOLIN HFA Inhale 2 puffs into the lungs every 4 (four) hours as needed for wheezing or shortness of breath (cough, shortness of breath or wheezing.).   folic acid 1 MG tablet Commonly known as:  FOLVITE Take 1 tablet (1 mg total) by mouth daily.   ICY HOT EX Apply 1 application topically daily as needed (muscle pain).   methotrexate 2.5 MG tablet Commonly known as:  RHEUMATREX Take 2 tablets (5 mg total) by mouth once a week. Caution:Chemotherapy. Protect from light.   naproxen 500 MG tablet Commonly known as:  NAPROSYN Take 500 mg by mouth 2 (two) times daily with a meal.   naproxen sodium 220 MG tablet Commonly known as:  ALEVE Take 220-440 mg by mouth 2 (two) times daily. Take 440 mg in the morning an 220 mg in the evening   oxyCODONE-acetaminophen 10-325 MG tablet Commonly known as:  PERCOCET Take 1 tablet by mouth 3 (three) times daily. pain   predniSONE 10 MG tablet Commonly known as:  DELTASONE Take 3 tablets (30 mg total) daily in the morning with food

## 2018-02-02 NOTE — Telephone Encounter (Signed)
Spoke with patient. He is aware of Brian's recs. Verbalized understanding. Nothing further needed at time of call.

## 2018-02-16 ENCOUNTER — Other Ambulatory Visit: Payer: Self-pay | Admitting: Pulmonary Disease

## 2018-02-16 DIAGNOSIS — D862 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: Secondary | ICD-10-CM

## 2018-02-17 ENCOUNTER — Telehealth: Payer: Self-pay | Admitting: Pulmonary Disease

## 2018-02-17 MED ORDER — METHOTREXATE 2.5 MG PO TABS: ORAL_TABLET | ORAL | 0 refills | Status: DC

## 2018-02-17 NOTE — Telephone Encounter (Signed)
Instructions stated in pt's last OV with Elisha Headland, NP:  We will start methotrexate today Methotrexate 2.5 tablet  >>>Take 5 mg (2 tablets) weekly >>> In 2 weeks we will increase this to 7.5 mg (3 tablets) weekly  Called and spoke with pt to verify the information from last OV. Verified pt's preferred pharmacy and sent Rx in for pt.nothing further needed.

## 2018-02-26 NOTE — Progress Notes (Deleted)
 @Patient ID: Anthony [name], male    DOB: 10/14/1980, 37 y.o.   MRN: [id number]  No chief complaint on file.   Referring provider: No ref. provider found  HPI:  37-year-old male patient followed in our office for sarcoidosis.  PMH: Morbid obesity, knee pain, chronic pain management Smoker/ Smoking History: Former smoker, 3 pack years Maintenance: Prednisone 30 mg daily, Methotrexate 7.5mg  Pt of: Dr. Byrum  Recent  Pulmonary Encounters:   01/23/2018  - Visit -BM 37-year-old arriving today for follow-up appointment.  Patient was late to office so was unable to see Dr. Byrum but was able to be rescheduled with myself.  Patient reports is a 2-week follow-up to discuss transitioning from prednisone a chronic alternative such as methotrexate.  Patient is currently on 30 mg of prednisone daily this is to manage his sarcoidosis. Patient is requesting a refill of his Percocet pain medications as he has not been established with pain management yet.  Patient reports that he needs to pay a bill in order to be seen at [facility] to resume treatment there.  Patient reports he has not been able to do that yet.  His patient is disabled and unable to work. Patient also reports that he has not followed up and establish with primary care as previously instructed. Plan: start methotrexate, labwork today, folic acid, chest xray   02/26/2018  - Visit   HPI  Tests:   09/22/2017-pulmonary function test- FVC 95, F/F ratio 84, FEV1 97, no significant bronchodilator change DLCO 174  Imaging:  09/10/2017-CT chest with contrast- market decrease in size of right perihilar pulmonary parenchymal nodules, and left upper lobe nodule with linear scarring  05/22/2017-CT chest without contrast- mediastinal and bilateral hilar adenopathy is decreased, scattered irregular nodular peribronchovascular fossae of consolidation in both lungs are stable to mildly decreased in size.   Cardiac:    02/11/2017-echocardiogram-LV ejection fraction 55 to 60%, systolic function is normal,  Labs:  03/17/2017-quant gold-negative 02/17/17>>>Labs showed elevated LDH at 295, ESR 29, ACE level 83, c-RP 55 (markedly elevated).  01/02/2018-hepatic function panel, basic metabolic panel, CBC with differential - all normal   FENO:  No results found for: NITRICOXIDE  PFT: PFT Results Latest Ref Rng & Units 09/22/2017  FVC-Pre L 3.98  FVC-Predicted Pre % 95  FVC-Post L 3.68  FVC-Predicted Post % 88  Pre FEV1/FVC % % 84  Post FEV1/FCV % % 84  FEV1-Pre L 3.36  FEV1-Predicted Pre % 97  DLCO UNC% % 174  DLCO COR %Predicted % 159    Imaging: No results found.  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 Xarelto [Rivaroxaban]     Bleeding gums, weakness, eye swelling/bluring vision, intensified joint pain     There is no immunization history on file for this patient.  Past Medical History:  Diagnosis Date  . Anxiety   . Depression   . Dyspnea   . Eczema of hand   . Headache    hx of with tooth pain  . Obese   . Pneumonia    12/2016  . Pre-diabetes   . Sleep apnea    cpap    Tobacco History: Social History   Tobacco Use    Smoking Status Former Smoker  . Packs/day: 0.30  . Years: 10.00  . Pack years: 3.00  . Types: Cigarettes  . Last attempt to quit: 09/30/2016  . Years since quitting: 1.4  Smokeless Tobacco Never Used   Counseling given: Not Answered   Outpatient Encounter Medications as of 02/27/2018  Medication Sig  . albuterol (PROVENTIL HFA;VENTOLIN HFA) 108 (90 Base) MCG/ACT inhaler Inhale 2 puffs into the lungs every 4 (four) hours as needed for wheezing or shortness of breath (cough, shortness of breath or wheezing.).  .  folic acid (FOLVITE) 1 MG tablet Take 1 tablet (1 mg total) by mouth daily.  . Menthol, Topical Analgesic, (ICY HOT EX) Apply 1 application topically daily as needed (muscle pain).  . methotrexate (RHEUMATREX) 2.5 MG tablet Take 5mg weekly for 2 weeks and then increase to 7.5mg weekly.  Caution:Chemotherapy. Protect from light.  . naproxen (NAPROSYN) 500 MG tablet Take 500 mg by mouth 2 (two) times daily with a meal.  . naproxen sodium (ANAPROX) 220 MG tablet Take 220-440 mg by mouth 2 (two) times daily. Take 440 mg in the morning an 220 mg in the evening  . oxyCODONE-acetaminophen (PERCOCET) 10-325 MG tablet Take 1 tablet by mouth 3 (three) times daily. pain  . predniSONE (DELTASONE) 10 MG tablet Take 3 tablets (30 mg total) daily in the morning with food   No facility-administered encounter medications on file as of 02/27/2018.      Review of Systems  Review of Systems   Physical Exam  There were no vitals taken for this visit.  Wt Readings from Last 5 Encounters:  01/23/18 (!) 557 lb 12.8 oz (253 kg)  01/02/18 (!) 556 lb (252.2 kg)  12/05/17 (!) 557 lb 12.8 oz (253 kg)  11/04/17 (!) 557 lb (252.7 kg)  09/22/17 (!) 558 lb (253.1 kg)     Physical Exam    Lab Results:  CBC    Component Value Date/Time   WBC 9.3 01/23/2018 1229   RBC 4.99 01/23/2018 1229   HGB 15.6 01/23/2018 1229   HGB 14.6 02/17/2017 1111   HCT 45.7 01/23/2018 1229   HCT 44.3 02/17/2017 1111   PLT 295.0 01/23/2018 1229   PLT 278 02/17/2017 1111   MCV 91.6 01/23/2018 1229   MCV 88.0 02/17/2017 1111   MCH 29.0 02/17/2017 1111   MCH 29.4 01/17/2017 1025   MCHC 34.2 01/23/2018 1229   RDW 14.1 01/23/2018 1229   RDW 14.1 02/17/2017 1111   LYMPHSABS 1.2 01/23/2018 1229   LYMPHSABS 1.5 02/17/2017 1111   MONOABS 0.3 01/23/2018 1229   MONOABS 1.0 (H) 02/17/2017 1111   EOSABS 0.0 01/23/2018 1229   EOSABS 0.4 02/17/2017 1111   BASOSABS 0.1 01/23/2018 1229   BASOSABS 0.1 02/17/2017 1111    BMET     Component Value Date/Time   NA 141 01/23/2018 1229   NA 138 02/17/2017 1111   K 4.1 01/23/2018 1229   K 4.2 02/17/2017 1111   CL 103 01/23/2018 1229   CO2 28 01/23/2018 1229   CO2 28 02/17/2017 1111   GLUCOSE 120 (H) 01/23/2018 1229   GLUCOSE 91 02/17/2017 1111   BUN 10 01/23/2018 1229   BUN 7.1 02/17/2017 1111   CREATININE 0.98 01/23/2018 1229   CREATININE 0.9 02/17/2017 1111   CALCIUM 9.5 01/23/2018 1229   CALCIUM 9.6 02/17/2017 1111   GFRNONAA >60 01/02/2017 1805   GFRAA >60 01/02/2017 1805    BNP    Component Value Date/Time     BNP 5 02/03/2017 1009    ProBNP No results found for: PROBNP    Assessment & Plan:     No problem-specific Assessment & Plan notes found for this encounter.      P , NP 02/26/2018  

## 2018-02-27 ENCOUNTER — Ambulatory Visit: Payer: BLUE CROSS/BLUE SHIELD | Admitting: Pulmonary Disease

## 2018-03-04 NOTE — Progress Notes (Signed)
_0  ID: Anthony [name], male    DOB: [birth year], 37 y.o.   MRN: [id number]  Chief Complaint  Patient presents with  . Follow-up    Sarcoid, 7.5 mg of methotrexate, still on 30 mg of prednisone    Referring provider: No ref. provider found  HPI:  36 year old male patient followed in our office for sarcoidosis.  PMH: Morbid obesity, knee pain, chronic pain management Smoker/ Smoking History: Former smoker, 3 pack years Maintenance: Prednisone 30 mg daily, Methotrexate 7.70m  Pt of: Dr. BLamonte Sakai Recent [facility] Encounters:   01/23/2018  - Visit -BM 37year old arriving today for follow-up appointment.  Patient was late to office so was unable to see Dr. BLamonte Sakaibut was able to be rescheduled with myself.  Patient reports is a 2-week follow-up to discuss transitioning from prednisone a chronic alternative such as methotrexate.  Patient is currently on 30 mg of prednisone daily this is to manage his sarcoidosis. Patient is requesting a refill of his Percocet pain medications as he has not been established with pain management yet.  Patient reports that he needs to pay [facility]  Patient reports he has not been able to do that yet.  His patient is disabled and unable to work. Patient also reports that he has not followed up and establish with primary care as previously instructed. Plan: start methotrexate, labwork today, folic acid, chest xray   03/05/2018  - Visit   37year old male patient presenting today for follow-up visit.  Patient was started at last office visit on methotrexate.  Patient continues to be on 30 mg prednisone for management of sarcoidosis.  Patient reports that he has successfully increased his doses to 7.5 mg of methotrexate and he is on that is currently right now.  Patient continues to struggle with pain management.  Patient reports he is completely out of Percocet.  Patient is requesting a  prescription today.  [facility] or followed up on our referrals for primary care to be established for the patient.  Patient [place].  Patient reports he cannot pay this.  Due [facility] as well as run out of Percocet.  Patient took it upon himself to start using kratom as well as CBD oil.  Patient reports he is not inhaling the substances but that they are pills that he is purchase.  Patient reports that he has had some pain management relief by using these substances.  Patient reports he is not using his rescue inhaler.  Patient reports he is considering applying for disability.  Patient reports he has had this denied in the past.  Patient is wondering what that process would look like.   Tests:  09/22/2017-pulmonary function test- FVC 95, F/F ratio 84, FEV1 97, no significant bronchodilator change DLCO 174  Imaging:  09/10/2017-CT chest with contrast- market decrease in size of right perihilar pulmonary parenchymal nodules, and left upper lobe nodule with linear scarring  05/22/2017-CT chest without contrast- mediastinal and bilateral hilar adenopathy is decreased, scattered irregular nodular peribronchovascular fossae of consolidation in both lungs are stable to mildly decreased in size.   Cardiac:  02/11/2017-echocardiogram-LV ejection fraction 55 to 678% systolic function is normal,  Labs:  03/17/2017-quant gold-negative 02/17/17>>>Labs showed elevated LDH at 295, ESR 29, ACE  level 83, c-RP 55 (markedly elevated).  01/02/2018-hepatic function panel, basic metabolic panel, CBC with differential - all normal  FENO:  No results found for: NITRICOXIDE  PFT: PFT Results Latest Ref Rng & Units 09/22/2017  FVC-Pre L 3.98  FVC-Predicted Pre % 95  FVC-Post L 3.68  FVC-Predicted Post % 88  Pre FEV1/FVC % % 84    Post FEV1/FCV % % 84  FEV1-Pre L 3.36  FEV1-Predicted Pre % 97  DLCO UNC% % 174  DLCO COR %Predicted % 159    Imaging: No results found.  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 Xarelto [Rivaroxaban]     Bleeding gums, weakness, eye swelling/bluring vision, intensified joint pain     There is no immunization history on file for this patient.  >>> Patient continues to refuse recommended vaccines  Past Medical History:  Diagnosis Date  . Anxiety   . Depression   . Dyspnea   . Eczema of hand   . Headache    hx of with tooth pain  . Obese   . Pneumonia    12/2016  . Pre-diabetes   . Sleep apnea    cpap    Tobacco History: Social History   Tobacco Use  Smoking Status Former Smoker  . Packs/day: 0.30  . Years: 10.00  . Pack years: 3.00  . Types: Cigarettes  . Last attempt to quit: 09/30/2016  . Years since quitting: 1.4  Smokeless Tobacco Never Used   Counseling given: Not Answered  Continue not smoking  Outpatient Encounter Medications as of 03/05/2018  Medication Sig  . albuterol (PROVENTIL HFA;VENTOLIN HFA) 108 (90 Base) MCG/ACT inhaler Inhale 2 puffs into the lungs every 4 (four) hours as needed for wheezing or shortness of breath (cough, shortness of breath or wheezing.).  Marland Kitchen folic acid (FOLVITE) 1 MG tablet Take 1 tablet (1 mg total) by mouth daily.  . methotrexate (RHEUMATREX) 2.5 MG tablet Take 7.70m weekly, increase dose every 2.564mfor every 2 weeks until goal dose of 1575meekly dose  Caution:Chemotherapy. Protect from light.  . predniSONE (DELTASONE) 10 MG tablet Take 3 tablets (30 mg total) daily in the morning with food  . [DISCONTINUED] folic acid (FOLVITE) 1 MG tablet Take 1 tablet (1 mg total) by mouth  daily.  . [DISCONTINUED] methotrexate (RHEUMATREX) 2.5 MG tablet Take 5mg68mekly for 2 weeks and then increase to 7.5mg 4mkly.  Caution:Chemotherapy. Protect from light.  . Menthol, Topical Analgesic, (ICY HOT EX) Apply 1 application topically daily as needed (muscle pain).  . naproxen (NAPROSYN) 500 MG tablet Take 500 mg by mouth 2 (two) times daily with a meal.  . naproxen sodium (ANAPROX) 220 MG tablet Take 220-440 mg by mouth 2 (two) times daily. Take 440 mg in the morning an 220 mg in the evening  . oxyCODONE-acetaminophen (PERCOCET) 10-325 MG tablet Take 1 tablet by mouth 3 (three) times daily. pain (Patient not taking: Reported on 03/05/2018)   No facility-administered encounter medications on file as of 03/05/2018.     Review of Systems  Review of Systems  Constitutional: Negative for activity change, chills, fatigue, fever and unexpected weight change.  HENT: Negative for postnasal drip, rhinorrhea, sinus pressure, sinus pain,  sneezing and sore throat.   Eyes: Negative.   Respiratory: Positive for shortness of breath (episode of sob last week ). Negative for cough and wheezing.   Cardiovascular: Negative for chest pain and palpitations.  Gastrointestinal: Negative for constipation, diarrhea, nausea and vomiting.  Endocrine: Negative.   Musculoskeletal: Positive for arthralgias.  Skin: Negative.   Allergic/Immunologic: Positive for immunocompromised state.  Neurological: Positive for headaches. Negative for dizziness.  Psychiatric/Behavioral: Negative.  Negative for dysphoric mood. The patient is not nervous/anxious.   All other systems reviewed and are negative.    Physical Exam  BP 124/68 (BP Location: Left Wrist, Cuff Size: Large)   Pulse (!) 103   Ht 5' 8.25" (1.734 m)   Wt (!) 560 lb 6.4 oz (254.2 kg)   SpO2 99%   BMI 84.59 kg/m   Wt Readings from Last 5 Encounters:  03/05/18 (!) 560 lb 6.4 oz (254.2 kg)  01/23/18 (!) 557 lb 12.8 oz (253 kg)  01/02/18 (!) 556 lb  (252.2 kg)  12/05/17 (!) 557 lb 12.8 oz (253 kg)  11/04/17 (!) 557 lb (252.7 kg)    Physical Exam  Constitutional: He is oriented to person, place, and time and well-developed, well-nourished, and in no distress. No distress.  HENT:  Head: Normocephalic and atraumatic.  Right Ear: Hearing, tympanic membrane, external ear and ear canal normal.  Left Ear: Hearing, external ear and ear canal normal.  Nose: Nose normal. Right sinus exhibits no maxillary sinus tenderness and no frontal sinus tenderness. Left sinus exhibits no maxillary sinus tenderness and no frontal sinus tenderness.  Mouth/Throat: Uvula is midline and oropharynx is clear and moist. No oropharyngeal exudate.  + Left ear canal erythematous  Eyes: Pupils are equal, round, and reactive to light.  Neck: Normal range of motion. Neck supple. No JVD present.  Cardiovascular: Normal rate, regular rhythm and normal heart sounds.  Pulmonary/Chest: Effort normal and breath sounds normal. No accessory muscle usage. No respiratory distress. He has no decreased breath sounds. He has no wheezes. He has no rhonchi.  Diminished breath sounds throughout entire exam was likely related to body habitus  Abdominal: Soft. Bowel sounds are normal. There is no tenderness.  Musculoskeletal: Normal range of motion. He exhibits no edema.  Lymphadenopathy:    He has no cervical adenopathy.  Neurological: He is alert and oriented to person, place, and time. Gait normal.  Skin: Skin is warm and dry. He is not diaphoretic. No erythema.  Psychiatric: Mood, memory, affect and judgment normal.  Nursing note and vitals reviewed.     Lab Results:  CBC    Component Value Date/Time   WBC 9.3 01/23/2018 1229   RBC 4.99 01/23/2018 1229   HGB 15.6 01/23/2018 1229   HGB 14.6 02/17/2017 1111   HCT 45.7 01/23/2018 1229   HCT 44.3 02/17/2017 1111   PLT 295.0 01/23/2018 1229   PLT 278 02/17/2017 1111   MCV 91.6 01/23/2018 1229   MCV 88.0 02/17/2017 1111    MCH 29.0 02/17/2017 1111   MCH 29.4 01/17/2017 1025   MCHC 34.2 01/23/2018 1229   RDW 14.1 01/23/2018 1229   RDW 14.1 02/17/2017 1111   LYMPHSABS 1.2 01/23/2018 1229   LYMPHSABS 1.5 02/17/2017 1111   MONOABS 0.3 01/23/2018 1229   MONOABS 1.0 (H) 02/17/2017 1111   EOSABS 0.0 01/23/2018 1229   EOSABS 0.4 02/17/2017 1111   BASOSABS 0.1 01/23/2018 1229   BASOSABS 0.1 02/17/2017 1111    BMET    Component Value Date/Time  NA 141 01/23/2018 1229   NA 138 02/17/2017 1111   K 4.1 01/23/2018 1229   K 4.2 02/17/2017 1111   CL 103 01/23/2018 1229   CO2 28 01/23/2018 1229   CO2 28 02/17/2017 1111   GLUCOSE 120 (H) 01/23/2018 1229   GLUCOSE 91 02/17/2017 1111   BUN 10 01/23/2018 1229   BUN 7.1 02/17/2017 1111   CREATININE 0.98 01/23/2018 1229   CREATININE 0.9 02/17/2017 1111   CALCIUM 9.5 01/23/2018 1229   CALCIUM 9.6 02/17/2017 1111   GFRNONAA >60 01/02/2017 1805   GFRAA >60 01/02/2017 1805    BNP    Component Value Date/Time   BNP 5 02/03/2017 1009    ProBNP No results found for: PROBNP    Assessment & Plan:   37 year old male patient presenting today for follow-up visit we will get baseline lab work today.  We will continue on plan to titrate methotrexate up to 15 mg weekly dose for management of sarcoidosis.  I discussed the patient again at this office visit that we will not be managing his chronic pain with the Percocet prescription.  I recommend that he reconcile his finances with [facility]oped[facility] so that he can be evaluated by them.  Patient will follow-up with our office in 1 month for lab work and to start titration of prednisone down. Patient schedule 8-week follow-up with Dr. Lamonte Sakai.  We will schedule patient with 6-minute walk.  Discussed extensively with patient that we do not recommend using CBD oil or Kratom.   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[place]) Lab work today   Will order 6-minute walk   Continue methotrexate Continue on  7.5 weekly dose for this week >>> 03/09/2018 increased to 10 mg weekly dose maintain this for 2 weeks >>> 03/23/2018 increased to 12.5 mg weekly dose maintain this for 2 weeks >>> 04/06/2018 increased to goal of 15 mg weekly dose maintain  Continue folic acid  Continue prednisone dose >>> We will work on decreasing once methotrexate is at goal of 15 mg   We do not recommend using Kratom or CBD oil Do not inhale the substances  We recommend that you follow-up with [facility]opedics for further pain management  Schedule follow-up office visit the week of 04/06/2018 for lab work  8-week follow-up office visit with Dr. Lamonte Sakai  Morbid obesity [facility]) Continue to work towards healthy weight  Chronic pain We do not recommend using Kratom or CBD oil Do not inhale the substances  We recommend that you follow-up with [facility] for further pain management  Schedule follow-up office visit the week of 04/06/2018 for lab work  8-week follow-up office visit with Dr. Lamonte Sakai  This was a 43-minute long office visit with her 50% that time direct face-to-face patient care, assessment, plan of care follow-up   Lauraine Rinne, NP 03/05/2018

## 2018-03-05 ENCOUNTER — Encounter: Payer: Self-pay | Admitting: Pulmonary Disease

## 2018-03-05 ENCOUNTER — Ambulatory Visit: Payer: BLUE CROSS/BLUE SHIELD | Admitting: Pulmonary Disease

## 2018-03-05 DIAGNOSIS — D862 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: Secondary | ICD-10-CM | POA: Diagnosis not present

## 2018-03-05 DIAGNOSIS — G894 Chronic pain syndrome: Secondary | ICD-10-CM

## 2018-03-05 MED ORDER — FOLIC ACID 1 MG PO TABS: 1.0000 mg | ORAL_TABLET | Freq: Every day | ORAL | 6 refills | Status: DC

## 2018-03-05 MED ORDER — METHOTREXATE 2.5 MG PO TABS: ORAL_TABLET | ORAL | 1 refills | Status: DC

## 2018-03-05 NOTE — Assessment & Plan Note (Addendum)
Lab work today   Will order 6-minute walk   Continue methotrexate Continue on 7.5 weekly dose for this week >>> 03/09/2018 increased to 10 mg weekly dose maintain this for 2 weeks >>> 03/23/2018 increased to 12.5 mg weekly dose maintain this for 2 weeks >>> 04/06/2018 increased to goal of 15 mg weekly dose maintain  Continue folic acid  Continue prednisone dose >>> We will work on decreasing once methotrexate is at goal of 15 mg   We do not recommend using Kratom or CBD oil Do not inhale the substances  We recommend that you follow-up with [facility] orthopedics for further pain management  Schedule follow-up office visit the week of 04/06/2018 for lab work  8-week follow-up office visit with Dr. Delton Coombes

## 2018-03-05 NOTE — Assessment & Plan Note (Signed)
Continue to work towards healthy weight 

## 2018-03-05 NOTE — Patient Instructions (Addendum)
Lab work today   Will order 6-minute walk   Continue methotrexate Continue on 7.5 weekly dose for this week >>> 03/09/2018 increased to 10 mg weekly dose maintain this for 2 weeks >>> 03/23/2018 increased to 12.5 mg weekly dose maintain this for 2 weeks >>> 04/06/2018 increased to goal of 15 mg weekly dose maintain  Continue folic acid  Continue prednisone dose >>> We will work on decreasing once methotrexate is at goal of 15 mg   We do not recommend using Kratom or CBD oil Do not inhale the substances  We recommend that you follow-up with [facility] orthopedics for further pain management    Schedule follow-up office visit the week of 04/06/2018 for lab work  8-week follow-up office visit with Dr. Delton Coombes   If you are starting file disability paperwork all that paperwork needs to be routed to the location listed below: [place] [facility] [address]-floor [place], [place] [address] Telephone [phone]   November/2019 we will be moving! We will no longer be at our [place] location.  Be on the look out for a post card/mailer to let you know we have officially moved.  Our new address and phone number will be:  [address] [place], [place] [address] Telephone number: [phone]  It is flu season:   >>>Remember to be washing your hands regularly, using hand sanitizer, be careful to use around herself with has contact with people who are sick will increase her chances of getting sick yourself. >>> Best ways to protect herself from the flu: Receive the yearly flu vaccine, practice good hand hygiene washing with soap and also using hand sanitizer when available, eat a nutritious meals, get adequate rest, hydrate appropriately   Please contact the office if your symptoms worsen or you have concerns that you are not improving.   Thank you for choosing [facility] for your healthcare, and for allowing [place] to partner with you on your  healthcare journey. I am thankful to be able to provide care to you today.   Elisha Headland FNP-C

## 2018-03-05 NOTE — Assessment & Plan Note (Addendum)
We do not recommend using Kratom or CBD oil Do not inhale the substances  We recommend that you follow-up with [facility] orthopedics for further pain management  Schedule follow-up office visit the week of 04/06/2018 for lab work  8-week follow-up office visit with Dr. Delton Coombes

## 2018-03-30 ENCOUNTER — Ambulatory Visit: Payer: BLUE CROSS/BLUE SHIELD | Admitting: Nurse Practitioner

## 2018-03-30 DIAGNOSIS — Z0289 Encounter for other administrative examinations: Secondary | ICD-10-CM

## 2018-04-09 IMAGING — DX DG CHEST 2V
3 series · 3 of 3 positions shown · non-contrast
Comparison: 01/02/2017

CLINICAL DATA: Pneumonia. Completed antibiotics continued fever.
Morbid obesity.

EXAM:
CHEST  2 VIEW

[chest pa (1 of 2)]
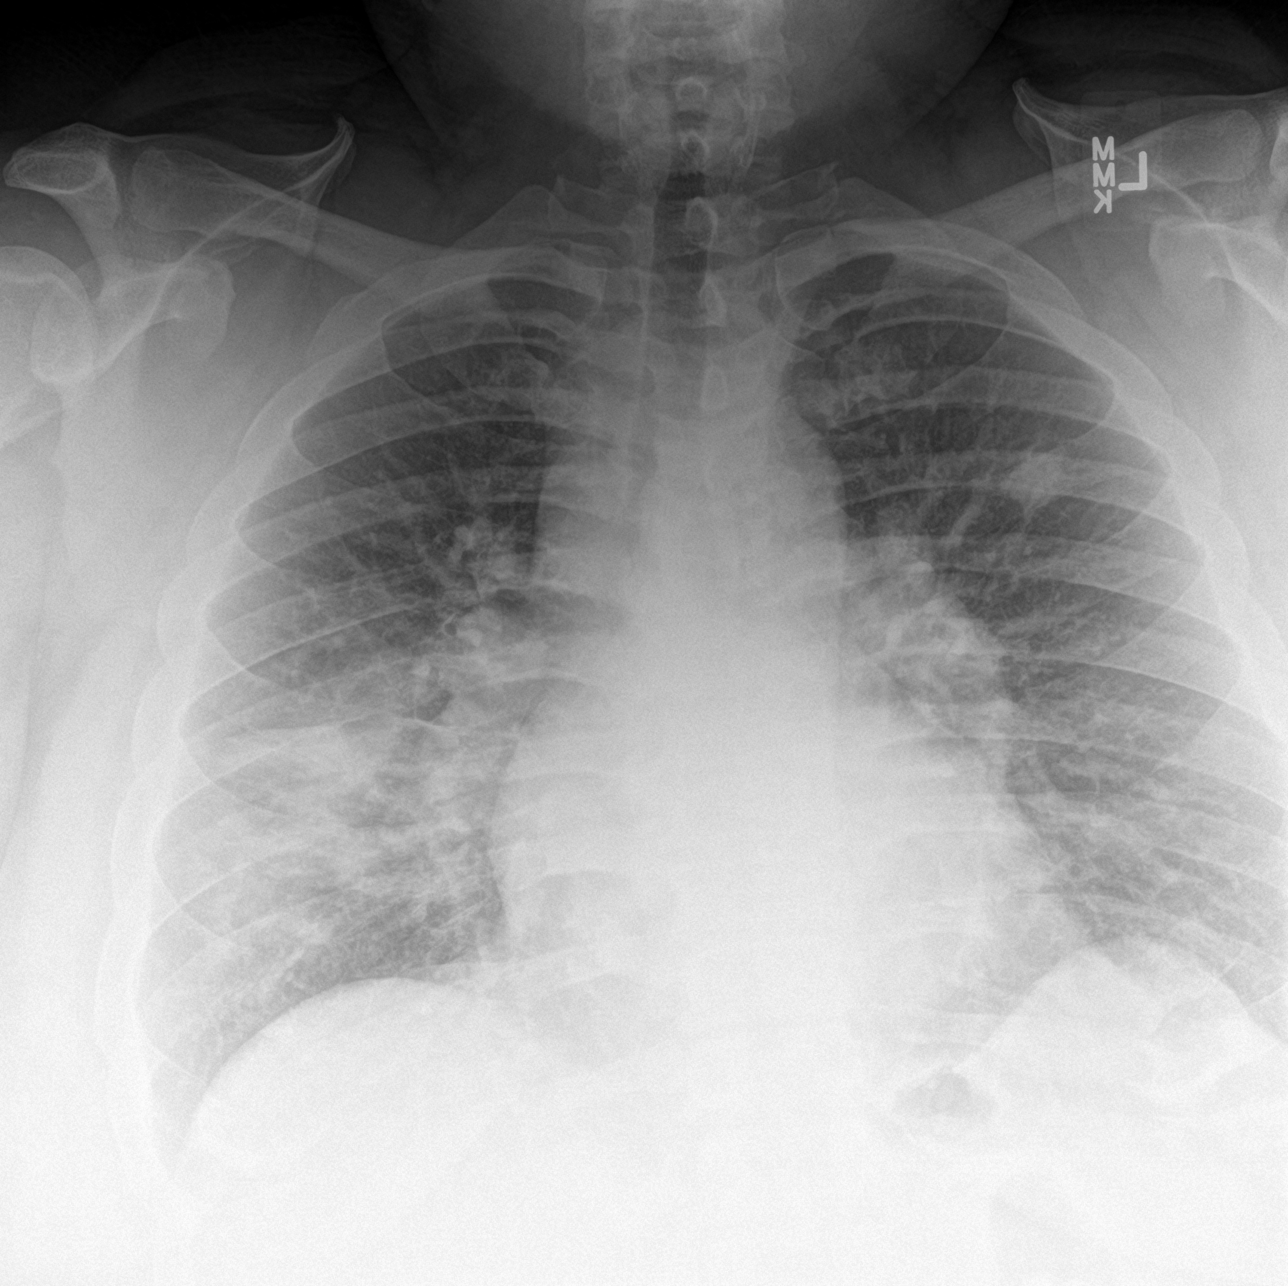

[chest lat]
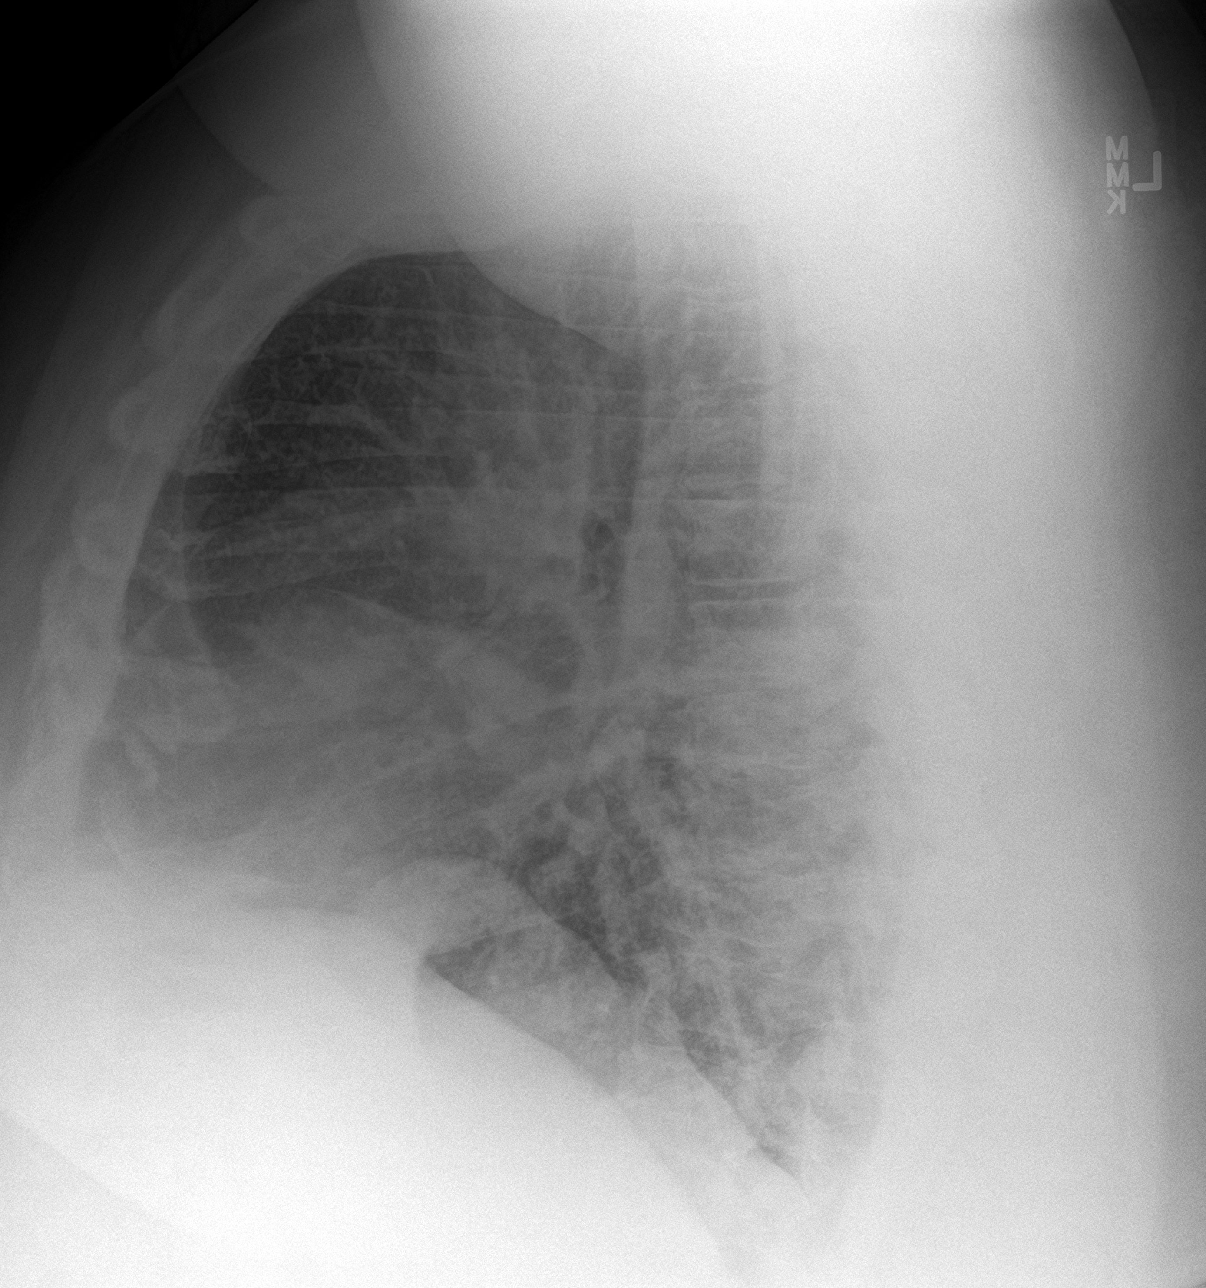

[chest pa (2 of 2)]
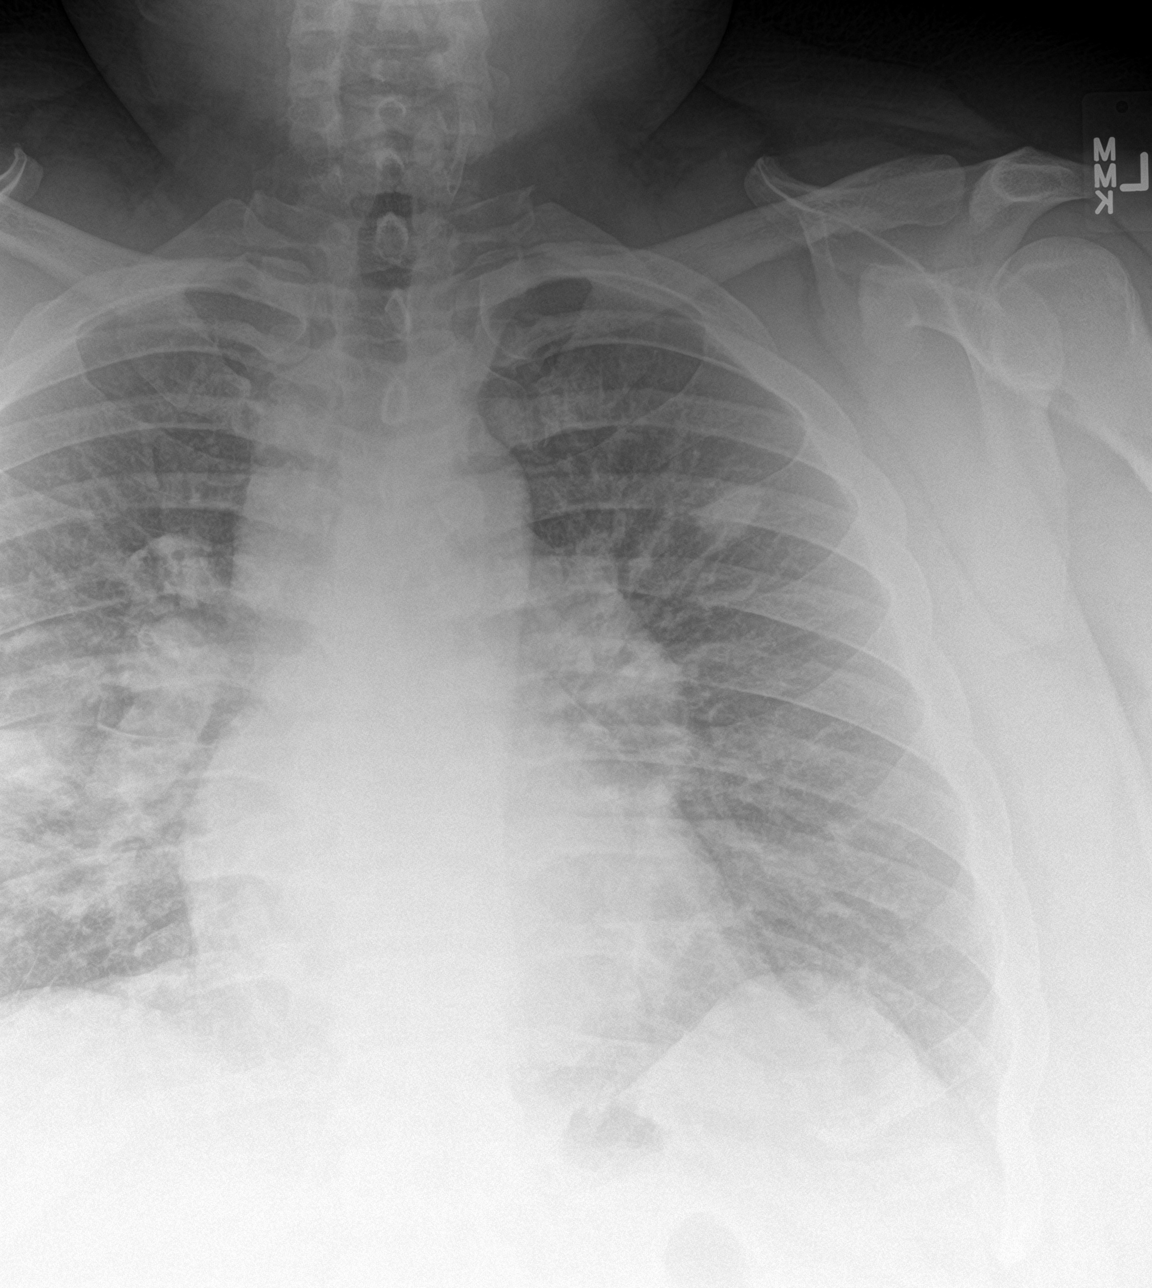

[3 of 3 positions shown; findings below may reference images not displayed]

FINDINGS: Patchy airspace disease in the right middle lobe unchanged. Nodular
density left upper lobe measuring 15 mm also unchanged. Mild patchy
left lower lobe airspace disease

Heart size within normal limits. Pulmonary artery enlargement may
represent vascular congestion or pulmonary artery hypertension. No
effusion.
IMPRESSION: No significant change from the prior study. While this may represent
slowly resolving pneumonia, consider follow-up chest CT to exclude
underlying neoplastic process or atypical infection

Pulmonary artery enlargement suggesting pulmonary artery
hypertension. Underlying mild heart failure not excluded.

## 2018-04-29 ENCOUNTER — Other Ambulatory Visit: Payer: Self-pay | Admitting: Emergency Medicine

## 2018-04-29 ENCOUNTER — Other Ambulatory Visit: Payer: Self-pay | Admitting: Pulmonary Disease

## 2018-04-29 DIAGNOSIS — D862 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: Secondary | ICD-10-CM

## 2018-04-30 ENCOUNTER — Ambulatory Visit: Payer: BLUE CROSS/BLUE SHIELD | Admitting: Emergency Medicine

## 2018-04-30 ENCOUNTER — Ambulatory Visit: Payer: BLUE CROSS/BLUE SHIELD

## 2018-04-30 NOTE — Telephone Encounter (Signed)
Pt requesting refill of methotrexate. Pt was supposed to have had an OV with 04/30/18 but no showed for both appts. Arlys John, please advise on the refill request for pt. Thanks!

## 2018-04-30 NOTE — Telephone Encounter (Signed)
As I have explained to the patient multiple times as has Dr Delton Coombes, pt needs follow up with our office for continued care. He needs an OV. He also needs to be rescheduled for his .   Please contact the patient figure out why he missed his appointment and why he no showed for the walk.   As of right now I do not see an indication to refill the medication if he is going to be non compliant with our plan of care.   Elisha Headland FNP

## 2018-05-04 NOTE — Telephone Encounter (Signed)
Thank you   Anthony [name]  

## 2018-05-04 NOTE — Telephone Encounter (Signed)
I have attempted to call pt but was unable to reach pt in regards to this.  Left message for pt to return call so we can let him know that before we can refill his methotrexate, pt would need to come in for the OV.

## 2018-05-04 NOTE — Telephone Encounter (Signed)
Has patient been contacted regarding this?  Anthony [name]

## 2018-05-29 ENCOUNTER — Other Ambulatory Visit: Payer: Self-pay | Admitting: Emergency Medicine

## 2018-05-29 DIAGNOSIS — D862 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: Secondary | ICD-10-CM

## 2018-06-09 ENCOUNTER — Encounter (HOSPITAL_COMMUNITY): Payer: Self-pay

## 2018-06-09 ENCOUNTER — Emergency Department (HOSPITAL_COMMUNITY)
Admission: EM | Admit: 2018-06-09 | Discharge: 2018-06-10 | Disposition: A | Discharge status: Home / Self Care | Payer: BLUE CROSS/BLUE SHIELD | Attending: Emergency Medicine | Admitting: Emergency Medicine

## 2018-06-09 DIAGNOSIS — N50811 Right testicular pain: Secondary | ICD-10-CM | POA: Insufficient documentation

## 2018-06-09 DIAGNOSIS — Z5321 Procedure and treatment not carried out due to patient leaving prior to being seen by health care provider: Secondary | ICD-10-CM | POA: Insufficient documentation

## 2018-06-09 HISTORY — DX: Sarcoidosis, unspecified: D86.9

## 2018-06-09 NOTE — ED Triage Notes (Signed)
Pt stating he has intermittent right sided testicular pain that radiates toward the hip down the leg. Pt states he has a history of sciatica but now his scrotum has swollen to about x3 its normal size. Pt reports taking 7-8 ibuprofen before arrival. Reports he also has sarcoidosis but ran out of medication last Thursday.

## 2018-06-10 ENCOUNTER — Emergency Department (HOSPITAL_COMMUNITY): Payer: BLUE CROSS/BLUE SHIELD

## 2018-06-10 ENCOUNTER — Ambulatory Visit: Payer: BLUE CROSS/BLUE SHIELD | Admitting: Family Medicine

## 2018-06-10 ENCOUNTER — Ambulatory Visit: Payer: Self-pay | Admitting: Family Medicine

## 2018-06-10 ENCOUNTER — Encounter: Payer: Self-pay | Admitting: Family Medicine

## 2018-06-10 VITALS — BP 142/86 | HR 70 | Temp 98.8°F | Ht 68.0 in | Wt >= 6400 oz

## 2018-06-10 DIAGNOSIS — N5089 Other specified disorders of the male genital organs: Secondary | ICD-10-CM | POA: Insufficient documentation

## 2018-06-10 LAB — COMPREHENSIVE METABOLIC PANEL
ALBUMIN: 4 g/dL (ref 3.5–5.2)
ALT: 22 U/L (ref 0–53)
AST: 18 U/L (ref 0–37)
Alkaline Phosphatase: 54 U/L (ref 39–117)
BUN: 7 mg/dL (ref 6–23)
CALCIUM: 9.4 mg/dL (ref 8.4–10.5)
CHLORIDE: 104 meq/L (ref 96–112)
CO2: 30 meq/L (ref 19–32)
Creatinine, Ser: 0.92 mg/dL (ref 0.40–1.50)
GFR: 112.33 mL/min (ref >60.00)
Glucose, Bld: 83 mg/dL (ref 70–99)
POTASSIUM: 3.9 meq/L (ref 3.5–5.1)
SODIUM: 140 meq/L (ref 135–145)
Total Bilirubin: 0.4 mg/dL (ref 0.2–1.2)
Total Protein: 7.2 g/dL (ref 6.0–8.3)

## 2018-06-10 LAB — POCT URINALYSIS DIPSTICK
Bilirubin, UA: NEGATIVE
Glucose, UA: NEGATIVE
KETONES UA: POSITIVE
LEUKOCYTES UA: NEGATIVE
NITRITE UA: NEGATIVE
PROTEIN UA: POSITIVE — AB
RBC UA: NEGATIVE
Urobilinogen, UA: 0.2 E.U./dL
pH, UA: 6 (ref 5.0–8.0)

## 2018-06-10 LAB — CBC WITH DIFFERENTIAL/PLATELET
Basophils Absolute: 0.1 10*3/uL (ref 0.0–0.1)
Basophils Relative: 1 % (ref 0.0–3.0)
Eosinophils Absolute: 0.3 10*3/uL (ref 0.0–0.7)
Eosinophils Relative: 3.8 % (ref 0.0–5.0)
HCT: 46.8 % (ref 39.0–52.0)
HEMOGLOBIN: 15.3 g/dL (ref 13.0–17.0)
Lymphocytes Relative: 32.6 % (ref 12.0–46.0)
Lymphs Abs: 2.2 10*3/uL (ref 0.7–4.0)
MCHC: 32.8 g/dL (ref 30.0–36.0)
MCV: 94 fl (ref 78.0–100.0)
Monocytes Absolute: 0.8 10*3/uL (ref 0.1–1.0)
Monocytes Relative: 11.2 % (ref 3.0–12.0)
Neutro Abs: 3.5 10*3/uL (ref 1.4–7.7)
Neutrophils Relative %: 51.4 % (ref 43.0–77.0)
Platelets: 267 10*3/uL (ref 150.0–400.0)
RBC: 4.97 Mil/uL (ref 4.22–5.81)
RDW: 13.9 % (ref 11.5–15.5)
WBC: 6.8 10*3/uL (ref 4.0–10.5)

## 2018-06-10 MED ORDER — OXYCODONE-ACETAMINOPHEN 10-325 MG PO TABS: 1.0000 | ORAL_TABLET | Freq: Three times a day (TID) | ORAL | 0 refills | Status: DC | PRN

## 2018-06-10 MED ORDER — OXYCODONE-ACETAMINOPHEN 5-325 MG PO TABS
1.0000 | ORAL_TABLET | ORAL | Status: DC | PRN
  Filled 2018-06-10: qty 1

## 2018-06-10 NOTE — Patient Instructions (Signed)
Scrotal Swelling  Scrotal swelling refers to a condition in which the sac of skin that contains the testes (scrotum) is enlarged or swollen. Many things can cause the scrotum to enlarge or swell, including:   Fluid around the testicle (hydrocele).   A weakened area in the muscles around the groin (hernia).   An enlarged vein around the testicle (varicocele).   An injury.   An infection.   Certain medical treatments.   Certain medical conditions, such as congestive heart failure.   A recent genital surgery or procedure.   A twisting of the spermatic cord that cuts off blood supply (testicular torsion).   Testicular cancer.  Scrotal swelling can happen along with scrotal pain.  Follow these instructions at home:   Until the swelling goes away:  ? Rest. The best position to rest in is to lie down.  ? Limit activity.   Put ice on the scrotum:  ? Put ice in a plastic bag.  ? Place a towel between your skin and the bag.  ? Leave the ice on for 20 minutes, 2-3 times a day for 1-2 days.   Place a rolled towel under your testicles for support.   Wear loose-fitting clothing or an athletic support cup for comfort.   Take over-the-counter and prescription medicines only as told by your health care provider.   Perform a monthly self-exam of the scrotum and penis. Feel for changes. Ask your health care provider how to perform a monthly self-exam if you are unsure.  Contact a health care provider if:   You have a sudden pain that is persistent and does not improve.   You have a heavy feeling or notice fluid in the scrotum.   You have pain or burning while urinating.   You have blood in your urine or semen.   You feel a lump around the testicle.   You notice that one testicle is larger than the other. Keep in mind that a small difference in size is normal.   You have a persistent dull ache or pain in your groin or scrotum.  Get help right away if:   The pain does not go away.   The pain becomes  severe.   You have a fever or chills.   You have pain or vomiting that cannot be controlled.   One or both sides of the scrotum are very red and swollen.   There is redness spreading upward from your scrotum to your abdomen or downward from your scrotum to your thighs.  Summary   Scrotal swelling refers to a condition in which the sac of skin that contains the testes (scrotum) is enlarged.   Many things can cause the scrotum to swell, including hydrocele, a hernia, and a varicocele.   Limiting activity and icing the scrotum may help reduce swelling and pain.   Contact your health care provider if you develop scrotal pain that is sudden and persistent, or if you have pain while urinating. Do this also if you feel a lump around the testicle or notice blood in your urine or semen.   Get help right away for uncontrolled pain or vomiting, for very red and swollen scrotum, or for fever or chills.  This information is not intended to replace advice given to you by your health care provider. Make sure you discuss any questions you have with your health care provider.  Document Released: 06/01/2010 Document Revised: 07/15/2016 Document Reviewed: 07/15/2016  Elsevier Interactive Patient Education    2019 [employer]

## 2018-06-10 NOTE — Progress Notes (Signed)
Anthony [name] - 38 y.o. male MRN [id number]  Date of birth: 02/08/1981  Subjective Chief Complaint  Patient presents with  . Groin Swelling    ongoing for two weeks-describes as shooting pain-went to ER yesterday was not seen    HPI Anthony [name] is a 38 y.o. male with history of sarcoidosis here today with complaint of testicular pain and swelling.  He reports that symptoms started initially around 2 weeks ago with worsening pain and swelling since that time.   Pain radiates to groin and back.  Has had some mild nausea.  Went to ER yesterday but reports that he left after waiting for 6 hours.  Has been out of medication (MTX and prednisone) for treatment of his sarcoid for about 1 week.   He denies fever, chills, nausea or vomiting, pain with urination, or changes to bowels.    ROS:  A comprehensive ROS was completed and negative except as noted per HPI  Allergies  Allergen Reactions  . Xarelto [Rivaroxaban]     Bleeding gums, weakness, eye swelling/bluring vision, intensified joint pain    Past Medical History:  Diagnosis Date  . Anxiety   . Depression   . Dyspnea   . Eczema of hand   . Headache    hx of with tooth pain  . Obese   . Pneumonia    12/2016  . Pre-diabetes   . Sarcoidosis   . Sleep apnea    cpap    Past Surgical History:  Procedure Laterality Date  . ARTHROSCOPY KNEE W/ DRILLING     right knee   . ENDOBRONCHIAL ULTRASOUND Bilateral 03/04/2017   Procedure: ENDOBRONCHIAL ULTRASOUND;  Surgeon: Leslye Peer, MD;  Location: [facility];  Service: Cardiopulmonary;  Laterality: Bilateral;  . left knee inner growth plate removed     to straighten leg    Social History   Socioeconomic History  . Marital status: Married    Spouse name: Not on file  . Number of children: Not on file  . Years of education: Not on file  . Highest education level: Not on file  Occupational History  . Occupation: Disabled  Social Needs  . Financial resource strain:  Not on file  . Food insecurity:    Worry: Not on file    Inability: Not on file  . Transportation needs:    Medical: Not on file    Non-medical: Not on file  Tobacco Use  . Smoking status: Former Smoker    Packs/day: 0.30    Years: 10.00    Pack years: 3.00    Types: Cigarettes    Last attempt to quit: 09/30/2016    Years since quitting: 1.6  . Smokeless tobacco: Never Used  Substance and Sexual Activity  . Alcohol use: No  . Drug use: No  . Sexual activity: Yes    Birth control/protection: Condom  Lifestyle  . Physical activity:    Days per week: Not on file    Minutes per session: Not on file  . Stress: Not on file  Relationships  . Social connections:    Talks on phone: Not on file    Gets together: Not on file    Attends religious service: Not on file    Active member of club or organization: Not on file    Attends meetings of clubs or organizations: Not on file    Relationship status: Not on file  Other Topics Concern  . Not on file  Social History Narrative  . Not on file    Family History  Problem Relation Age of Onset  . Hypertension Father   . Diabetes Father   . Stroke Father   . Lymphoma Paternal Uncle   . Ovarian cancer Maternal Grandmother 60    Health Maintenance  Topic Date Due  . HIV Screening  12/16/1996  . TETANUS/TDAP  01/18/2027 (Originally 12/16/2000)  . INFLUENZA VACCINE  Discontinued    ----------------------------------------------------------------------------------------------------------------------------------------------------------------------------------------------------------------- Physical Exam BP (!) 142/86   Pulse 70   Temp 98.8 F (37.1 C) (Oral)   Ht 5\' 8"  (1.727 m)   Wt (!) 568 lb (257.6 kg)   SpO2 98%   BMI 86.36 kg/m   Physical Exam Constitutional:      Appearance: Normal appearance.  HENT:     Head: Normocephalic and atraumatic.  Eyes:     General: No scleral icterus. Neck:     Musculoskeletal: Neck  supple.  Cardiovascular:     Rate and Rhythm: Normal rate and regular rhythm.  Pulmonary:     Effort: Pulmonary effort is normal.     Breath sounds: Normal breath sounds.  Genitourinary:    Comments: Scrotal swelling with firm and tender R testicle.  No hernias palpated but exam difficult due to body habitus.  Skin:    General: Skin is warm and dry.     Findings: No rash.  Neurological:     General: No focal deficit present.     Mental Status: He is alert.  Psychiatric:        Mood and Affect: Mood normal.        Behavior: Behavior normal.     ------------------------------------------------------------------------------------------------------------------------------------------------------------------------------------------------------------------- Assessment and Plan  Testicular swelling -UA and lab work ordered today.  -Stat ultrasound -?extrapulmonary manifestation of sarcoidosis

## 2018-06-10 NOTE — Assessment & Plan Note (Signed)
-  UA and lab work ordered today.  -Stat ultrasound -?extrapulmonary manifestation of sarcoidosis

## 2018-06-10 NOTE — ED Triage Notes (Signed)
Registration came up to report patient is requesting pain medication. When he was called for pain medication under standing protocol, patient didn't answer. Returned medication to pyxis. PATIENT DIDN'T RECEIVE ANY PAIN MEDICATION FOR THIS WRITER.

## 2018-06-11 ENCOUNTER — Other Ambulatory Visit: Payer: Self-pay | Admitting: General Practice

## 2018-06-11 ENCOUNTER — Ambulatory Visit (HOSPITAL_COMMUNITY)
Admission: RE | Admit: 2018-06-11 | Discharge: 2018-06-11 | Disposition: A | Discharge status: Home / Self Care | Payer: BLUE CROSS/BLUE SHIELD | Source: Ambulatory Visit | Attending: Family Medicine | Admitting: Family Medicine

## 2018-06-11 DIAGNOSIS — N5089 Other specified disorders of the male genital organs: Secondary | ICD-10-CM | POA: Diagnosis not present

## 2018-06-11 NOTE — Progress Notes (Signed)
-  [place] does not show evidence of torsion (twisted testicle) -There is an large area outside of the testicle that appears to be a cyst vs collection of fluid -Recommend having him see a urologist for further evaluation of this.  Please enter referral for him if he agrees to go.

## 2018-06-14 ENCOUNTER — Encounter: Payer: Self-pay | Admitting: Family Medicine

## 2018-06-14 DIAGNOSIS — M5431 Sciatica, right side: Secondary | ICD-10-CM | POA: Diagnosis not present

## 2018-06-14 DIAGNOSIS — Z888 Allergy status to other drugs, medicaments and biological substances status: Secondary | ICD-10-CM | POA: Diagnosis not present

## 2018-06-14 DIAGNOSIS — M1990 Unspecified osteoarthritis, unspecified site: Secondary | ICD-10-CM | POA: Diagnosis not present

## 2018-06-14 DIAGNOSIS — M13869 Other specified arthritis, unspecified knee: Secondary | ICD-10-CM | POA: Diagnosis not present

## 2018-06-14 DIAGNOSIS — M79604 Pain in right leg: Secondary | ICD-10-CM | POA: Diagnosis not present

## 2018-06-15 ENCOUNTER — Other Ambulatory Visit: Payer: Self-pay | Admitting: Family Medicine

## 2018-06-15 DIAGNOSIS — G8929 Other chronic pain: Secondary | ICD-10-CM

## 2018-06-15 DIAGNOSIS — M5441 Lumbago with sciatica, right side: Principal | ICD-10-CM

## 2018-06-15 NOTE — Telephone Encounter (Signed)
Referral placed to pain mgmt.

## 2018-06-22 ENCOUNTER — Encounter: Payer: Self-pay | Admitting: Family Medicine

## 2018-06-24 ENCOUNTER — Ambulatory Visit (INDEPENDENT_AMBULATORY_CARE_PROVIDER_SITE_OTHER): Payer: BLUE CROSS/BLUE SHIELD | Admitting: Family Medicine

## 2018-06-24 ENCOUNTER — Encounter: Payer: Self-pay | Admitting: Family Medicine

## 2018-06-24 VITALS — BP 136/88 | HR 86 | Temp 98.3°F | Ht 68.0 in | Wt >= 6400 oz

## 2018-06-24 DIAGNOSIS — M1711 Unilateral primary osteoarthritis, right knee: Secondary | ICD-10-CM | POA: Diagnosis not present

## 2018-06-24 DIAGNOSIS — D862 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: Secondary | ICD-10-CM

## 2018-06-24 DIAGNOSIS — R739 Hyperglycemia, unspecified: Secondary | ICD-10-CM

## 2018-06-24 DIAGNOSIS — Z0000 Encounter for general adult medical examination without abnormal findings: Secondary | ICD-10-CM

## 2018-06-24 DIAGNOSIS — L309 Dermatitis, unspecified: Secondary | ICD-10-CM

## 2018-06-24 DIAGNOSIS — Z13220 Encounter for screening for lipoid disorders: Secondary | ICD-10-CM

## 2018-06-24 DIAGNOSIS — M25562 Pain in left knee: Secondary | ICD-10-CM | POA: Insufficient documentation

## 2018-06-24 LAB — COMPREHENSIVE METABOLIC PANEL
ALT: 14 U/L (ref 0–53)
AST: 13 U/L (ref 0–37)
Albumin: 4.1 g/dL (ref 3.5–5.2)
Alkaline Phosphatase: 52 U/L (ref 39–117)
BUN: 7 mg/dL (ref 6–23)
CHLORIDE: 102 meq/L (ref 96–112)
CO2: 28 meq/L (ref 19–32)
Calcium: 9.4 mg/dL (ref 8.4–10.5)
Creatinine, Ser: 0.85 mg/dL (ref 0.40–1.50)
GFR: 123.05 mL/min (ref >60.00)
Glucose, Bld: 81 mg/dL (ref 70–99)
Potassium: 4.5 mEq/L (ref 3.5–5.1)
Sodium: 139 mEq/L (ref 135–145)
Total Bilirubin: 0.5 mg/dL (ref 0.2–1.2)
Total Protein: 7.2 g/dL (ref 6.0–8.3)

## 2018-06-24 LAB — HEMOGLOBIN A1C: Hgb A1c MFr Bld: 5.9 % (ref 4.6–6.5)

## 2018-06-24 LAB — LIPID PANEL
Cholesterol: 146 mg/dL (ref 0–200)
HDL: 50.5 mg/dL (ref >39.00)
LDL Cholesterol: 71 mg/dL (ref 0–99)
NonHDL: 95.32
Total CHOL/HDL Ratio: 3
Triglycerides: 124 mg/dL (ref 0.0–149.0)
VLDL: 24.8 mg/dL (ref 0.0–40.0)

## 2018-06-24 LAB — CBC
HEMATOCRIT: 46.4 % (ref 39.0–52.0)
Hemoglobin: 15.2 g/dL (ref 13.0–17.0)
MCHC: 32.6 g/dL (ref 30.0–36.0)
MCV: 93 fl (ref 78.0–100.0)
Platelets: 285 10*3/uL (ref 150.0–400.0)
RBC: 4.99 Mil/uL (ref 4.22–5.81)
RDW: 13.9 % (ref 11.5–15.5)
WBC: 6.9 10*3/uL (ref 4.0–10.5)

## 2018-06-24 LAB — TSH: TSH: 1.91 u[IU]/mL (ref 0.35–4.50)

## 2018-06-24 MED ORDER — BETAMETHASONE DIPROPIONATE AUG 0.05 % EX CREA: TOPICAL_CREAM | Freq: Two times a day (BID) | CUTANEOUS | 0 refills | Status: DC

## 2018-06-24 MED ORDER — OXYCODONE-ACETAMINOPHEN 10-325 MG PO TABS: 1.0000 | ORAL_TABLET | Freq: Three times a day (TID) | ORAL | 0 refills | Status: DC | PRN

## 2018-06-24 NOTE — Assessment & Plan Note (Signed)
-  Referral placed to orthopedics  -Discussed weight loss.

## 2018-06-24 NOTE — Assessment & Plan Note (Signed)
-  Worsened since being off steroid, rx for betamethasone.

## 2018-06-24 NOTE — Assessment & Plan Note (Signed)
Orders Placed This Encounter  Procedures  . Comp Met (CMET)  . CBC  . Lipid Profile  . TSH  . HgB A1c  . Ambulatory referral to Orthopedic Surgery    Referral Priority:   Routine    Referral Type:   Surgical    Referral Reason:   Specialty Services Required    Requested Specialty:   Orthopedic Surgery    Number of Visits Requested:   1  Immunizations:  Up to date Screenings: Lipid Anticipatory guidance/Risk factor reduction:  Discussed weight loss.  Additional recommendations per AVS

## 2018-06-24 NOTE — Assessment & Plan Note (Addendum)
-  Currently off medication -Reminded to schedule f/u with pulmonology.

## 2018-06-24 NOTE — Patient Instructions (Signed)

## 2018-06-24 NOTE — Progress Notes (Signed)
Anthony [name] - 38 y.o. male MRN [id number]  Date of birth: 1981-01-22  Subjective Chief Complaint  Patient presents with  . Annual Exam    wants to discuss dry patch on hands    HPI Anthony [name] is a 38 y.o. male with history of sarcoidosis, OSA, and chronic knee and back pain.  He is here today for annual exam.  Since his last visit with me he was seen in the ER for back and knee pain.  Xrays completed showing moderate OA.  Has had artrhroscopic surgery to R knee previously.  He is interested in seeing orthopedics again.  He has been off prednisone for about 1 month, now having flare of eczema of his hand as well.  He is aware that he needs to f/u with pulmonology.  He does have an upcoming appt with urology and has a pending pain mgmt referral.   Review of Systems  Constitutional: Negative for chills, fever, malaise/fatigue and weight loss.  HENT: Negative for congestion, ear pain and sore throat.   Eyes: Negative for blurred vision, double vision and pain.  Respiratory: Negative for cough and shortness of breath.   Cardiovascular: Negative for chest pain and palpitations.  Gastrointestinal: Negative for abdominal pain, blood in stool, constipation, heartburn and nausea.  Genitourinary: Negative for dysuria and urgency.  Musculoskeletal: Positive for back pain and joint pain. Negative for myalgias.  Neurological: Negative for dizziness and headaches.  Endo/Heme/Allergies: Does not bruise/bleed easily.  Psychiatric/Behavioral: Negative for depression. The patient is not nervous/anxious and does not have insomnia.      Allergies  Allergen Reactions  . Xarelto [Rivaroxaban]     Bleeding gums, weakness, eye swelling/bluring vision, intensified joint pain    Past Medical History:  Diagnosis Date  . Anxiety   . Depression   . Dyspnea   . Eczema of hand   . Headache    hx of with tooth pain  . Obese   . Pneumonia    12/2016  . Pre-diabetes   . Sarcoidosis   . Sleep  apnea    cpap    Past Surgical History:  Procedure Laterality Date  . ARTHROSCOPY KNEE W/ DRILLING     right knee   . ENDOBRONCHIAL ULTRASOUND Bilateral 03/04/2017   Procedure: ENDOBRONCHIAL ULTRASOUND;  Surgeon: Collene Gobble, MD;  Location: [facility];  Service: Cardiopulmonary;  Laterality: Bilateral;  . left knee inner growth plate removed     to straighten leg    Social History   Socioeconomic History  . Marital status: Married    Spouse name: Not on file  . Number of children: Not on file  . Years of education: Not on file  . Highest education level: Not on file  Occupational History  . Occupation: Disabled  Social Needs  . Financial resource strain: Not on file  . Food insecurity:    Worry: Not on file    Inability: Not on file  . Transportation needs:    Medical: Not on file    Non-medical: Not on file  Tobacco Use  . Smoking status: Former Smoker    Packs/day: 0.30    Years: 10.00    Pack years: 3.00    Types: Cigarettes    Last attempt to quit: 09/30/2016    Years since quitting: 1.7  . Smokeless tobacco: Never Used  Substance and Sexual Activity  . Alcohol use: No  . Drug use: No  . Sexual activity: Yes  Birth control/protection: Condom  Lifestyle  . Physical activity:    Days per week: Not on file    Minutes per session: Not on file  . Stress: Not on file  Relationships  . Social connections:    Talks on phone: Not on file    Gets together: Not on file    Attends religious service: Not on file    Active member of club or organization: Not on file    Attends meetings of clubs or organizations: Not on file    Relationship status: Not on file  Other Topics Concern  . Not on file  Social History Narrative  . Not on file    Family History  Problem Relation Age of Onset  . Hypertension Father   . Diabetes Father   . Stroke Father   . Lymphoma Paternal Uncle   . Ovarian cancer Maternal Grandmother 60    Health Maintenance  Topic  Date Due  . HIV Screening  12/16/1996  . TETANUS/TDAP  01/18/2027 (Originally 12/16/2000)  . INFLUENZA VACCINE  Discontinued    ----------------------------------------------------------------------------------------------------------------------------------------------------------------------------------------------------------------- Physical Exam BP 136/88   Pulse 86   Temp 98.3 F (36.8 C) (Oral)   Ht '5\' 8"'  (1.727 m)   Wt (!) 560 lb (254 kg)   SpO2 97%   BMI 85.15 kg/m   Physical Exam Constitutional:      Appearance: Normal appearance.  HENT:     Head: Normocephalic and atraumatic.     Right Ear: Tympanic membrane normal.     Left Ear: Tympanic membrane normal.     Nose: No congestion.     Mouth/Throat:     Mouth: Mucous membranes are moist.  Eyes:     General: No scleral icterus. Neck:     Musculoskeletal: Neck supple.  Cardiovascular:     Rate and Rhythm: Normal rate and regular rhythm.  Pulmonary:     Effort: Pulmonary effort is normal.     Breath sounds: Normal breath sounds.  Abdominal:     General: Bowel sounds are normal. There is no distension.     Palpations: Abdomen is soft.  Musculoskeletal:     Right lower leg: No edema.     Left lower leg: No edema.  Lymphadenopathy:     Cervical: No cervical adenopathy.  Skin:    General: Skin is warm and dry.     Comments: Scaly rash to bilateral hands.   Neurological:     General: No focal deficit present.     Mental Status: He is alert.  Psychiatric:        Mood and Affect: Mood normal.        Behavior: Behavior normal.     ------------------------------------------------------------------------------------------------------------------------------------------------------------------------------------------------------------------- Assessment and Plan  Primary osteoarthritis of right knee -Referral placed to orthopedics  -Discussed weight loss.   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    Eczema -Worsened since being off steroid, rx for betamethasone.   Well adult exam Orders Placed This Encounter  Procedures  . Comp Met (CMET)  . CBC  . Lipid Profile  . TSH  . HgB A1c  . Ambulatory referral to Orthopedic Surgery    Referral Priority:   Routine    Referral Type:   Surgical    Referral Reason:   Specialty Services Required    Requested Specialty:   Orthopedic Surgery    Number of Visits Requested:   1  Immunizations:  Up to date Screenings: Lipid Anticipatory guidance/Risk factor reduction:  Discussed weight loss.  Additional recommendations per AVS

## 2018-07-08 ENCOUNTER — Other Ambulatory Visit: Payer: Self-pay | Admitting: Family Medicine

## 2018-07-08 NOTE — Telephone Encounter (Signed)
Please advise 

## 2018-07-08 NOTE — Telephone Encounter (Signed)
Copied from CRM [id number]. Topic: Quick Communication - Rx Refill/Question >> Jul 08, 2018  2:38 PM Percival Spanish wrote: Medication    oxyCODONE-acetaminophen (PERCOCET) 10-325 MG tablet   Preferred Pharmacy  [facility]    Agent: Please be advised that RX refills may take up to 3 business days. We ask that you follow-up with your pharmacy.

## 2018-07-08 NOTE — Telephone Encounter (Signed)
Requested medication (s) are due for refill today -yes, if continuing  Requested medication (s) are on the active medication list - yes  Future visit scheduled - no  Last refill: 06/24/18  Notes to clinic: Patient is requesting refill of non delegated Rx- sent for PCP review of request  Requested Prescriptions  Pending Prescriptions Disp Refills   oxyCODONE-acetaminophen (PERCOCET) 10-325 MG tablet 30 tablet 0    Sig: Take 1 tablet by mouth every 8 (eight) hours as needed for pain. pain     Not Delegated - Analgesics:  Opioid Agonist Combinations Failed - 07/08/2018  2:42 PM      Failed - This refill cannot be delegated      Failed - Urine Drug Screen completed in last 360 days.      Passed - Valid encounter within last 6 months    Recent Outpatient Visits          2 weeks ago Well adult exam   [facility] [place], [place], [place]   4 weeks ago Testicular swelling   [facility] [place], [place], [place]              Requested Prescriptions  Pending Prescriptions Disp Refills   oxyCODONE-acetaminophen (PERCOCET) 10-325 MG tablet 30 tablet 0    Sig: Take 1 tablet by mouth every 8 (eight) hours as needed for pain. pain     Not Delegated - Analgesics:  Opioid Agonist Combinations Failed - 07/08/2018  2:42 PM      Failed - This refill cannot be delegated      Failed - Urine Drug Screen completed in last 360 days.      Passed - Valid encounter within last 6 months    Recent Outpatient Visits          2 weeks ago Well adult exam   [facility] [place], [place], [place]   4 weeks ago Testicular swelling   [facility] [place], [place], [place]

## 2018-07-12 ENCOUNTER — Other Ambulatory Visit: Payer: Self-pay | Admitting: Family Medicine

## 2018-07-13 ENCOUNTER — Telehealth: Payer: Self-pay

## 2018-07-13 ENCOUNTER — Encounter: Payer: Self-pay | Admitting: Family Medicine

## 2018-07-13 NOTE — Telephone Encounter (Signed)
Copied from CRM [id number]. Topic: Referral - Question >> Jul 13, 2018  9:15 AM Maye Hides wrote: Reason for CRM: Pt states he called [facility] where he was referred and was told by them that they are not a pain clinic.Can patient be referred to pain clinic ?Marland KitchenAlso he wants to know why his request for oxyCODONE-acetaminophen (PERCOCET) 10-325 MG tablet has been denied.Can this be prescribed until he gets into a pain clinic

## 2018-07-13 NOTE — Telephone Encounter (Signed)
Dr. Ashley Royalty please advise on message below,  From what I see in epic it is 4-6 week period of waiting for medical review for the pain clinic.

## 2018-07-13 NOTE — Telephone Encounter (Signed)
Pt would like to know why his refill for oxyCODONE-acetaminophen (PERCOCET) 10-325 MG tablet was denied. Please advise.

## 2018-07-14 ENCOUNTER — Other Ambulatory Visit: Payer: Self-pay | Admitting: Family Medicine

## 2018-07-14 MED ORDER — OXYCODONE-ACETAMINOPHEN 10-325 MG PO TABS: 1.0000 | ORAL_TABLET | Freq: Three times a day (TID) | ORAL | 0 refills | Status: DC | PRN

## 2018-07-14 NOTE — Telephone Encounter (Signed)
Referral placed to pain management recently.  I can send in a few more tabs (#15 sent) but I don't think long term opioids are the best solution for his chronic pain.  Has he seen orthopedics yet?

## 2018-07-14 NOTE — Telephone Encounter (Signed)
Dr. Ashley Royalty,   I spoke with pt and he stated he is scheduled with the orthopedic on Monday 07/20/18

## 2018-07-14 NOTE — Telephone Encounter (Signed)
Patient states that he has sent multiple messages and has not heard anything back. Patient states that he is in a considerable amount of pain and is requesting an update on the status of referral or if he will be able to get this medication filled by Dr. Ashley Royalty. Patient is requesting cb from someone in office as soon as possible with update. Please advise.

## 2018-08-04 ENCOUNTER — Encounter: Payer: Self-pay | Admitting: Family Medicine

## 2018-08-04 DIAGNOSIS — M5441 Lumbago with sciatica, right side: Principal | ICD-10-CM

## 2018-08-04 DIAGNOSIS — G894 Chronic pain syndrome: Secondary | ICD-10-CM

## 2018-08-04 DIAGNOSIS — G8929 Other chronic pain: Secondary | ICD-10-CM

## 2018-08-04 NOTE — Addendum Note (Signed)
Addended by: Maryelizabeth Rowan A on: 08/04/2018 04:46 PM   Modules accepted: Orders

## 2018-08-04 NOTE — Telephone Encounter (Signed)
Per pain management nothing further could be offered to patient and referral was closed.  He was referred to ortho but canceled appt and appears never called back to reschedule.  We can try adding gabapentin on for his sciatica.

## 2018-08-05 ENCOUNTER — Other Ambulatory Visit: Payer: Self-pay | Admitting: Family Medicine

## 2018-08-05 MED ORDER — GABAPENTIN 300 MG PO CAPS: 300.0000 mg | ORAL_CAPSULE | Freq: Three times a day (TID) | ORAL | 1 refills | Status: DC

## 2018-08-18 ENCOUNTER — Other Ambulatory Visit: Payer: Self-pay

## 2018-08-18 ENCOUNTER — Ambulatory Visit (INDEPENDENT_AMBULATORY_CARE_PROVIDER_SITE_OTHER): Payer: Self-pay | Admitting: Pulmonary Disease

## 2018-08-18 ENCOUNTER — Telehealth: Payer: Self-pay | Admitting: Emergency Medicine

## 2018-08-18 ENCOUNTER — Encounter: Payer: Self-pay | Admitting: Pulmonary Disease

## 2018-08-18 DIAGNOSIS — Z7952 Long term (current) use of systemic steroids: Secondary | ICD-10-CM

## 2018-08-18 DIAGNOSIS — Z91199 Patient's noncompliance with other medical treatment and regimen due to unspecified reason: Secondary | ICD-10-CM

## 2018-08-18 DIAGNOSIS — G894 Chronic pain syndrome: Secondary | ICD-10-CM

## 2018-08-18 DIAGNOSIS — G8929 Other chronic pain: Secondary | ICD-10-CM

## 2018-08-18 DIAGNOSIS — Z9119 Patient's noncompliance with other medical treatment and regimen: Secondary | ICD-10-CM

## 2018-08-18 DIAGNOSIS — D862 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: Secondary | ICD-10-CM

## 2018-08-18 MED ORDER — PREDNISONE 10 MG PO TABS: ORAL_TABLET | ORAL | 0 refills | Status: DC

## 2018-08-18 NOTE — Assessment & Plan Note (Signed)
Assessment: Has been off of chronic prednisone as well as methotrexate since November/2019 due to lack of follow-up with our office Patient reporting significant financial barriers to follow-up with our office regularly Last weight in chart is 560 pounds Patient reports that skin rash on his hands has come back now that he has been off of prednisone Patient also reporting increased shortness of breath and joint pain Patient reports he is walking with a cane  Plan: We start patient back on 30 mg of prednisone daily, 90-day prescription with no refills Once patient has insurance he needs to contact her office and schedule a visit with Dr. Delton Coombes or an APP Patient needs a steroid sparing agent for further management of his sarcoidosis and his breathing

## 2018-08-18 NOTE — Patient Instructions (Addendum)
Prednisone 10mg  tablet  >>> Prednisone 3 tablets (30 mg daily) take in the morning with food >>> 90 day prescription 0 refills   If you change your mind and would like to be set up with community health and wellness for primary care please contact our office and we can place a referral for you  Continue follow-up with primary care, you can discuss with Dr. Ashley Royalty and see if he is willing to chronically fill your prednisone  When you have insurance and have the ability to come into our office please contact appointment to be scheduled with Dr. Delton Coombes  Return in about 3 months (around 11/17/2018), or if symptoms worsen or fail to improve, for Follow up with Dr. Delton Coombes.   Coronavirus (COVID-19) Are you at risk?  Are you at risk for the Coronavirus (COVID-19)?  To be considered HIGH RISK for Coronavirus (COVID-19), you have to meet the following criteria:  . Traveled to [place], [place], [place], [place] or [place]; or in the [place] to [place], [place], [place], or [place]; and have fever, cough, and shortness of breath within the last 2 weeks of travel OR . Been in close contact with a person diagnosed with COVID-19 within the last 2 weeks and have fever, cough, and shortness of breath . IF YOU DO NOT MEET THESE CRITERIA, YOU ARE CONSIDERED LOW RISK FOR COVID-19.  What to do if you are HIGH RISK for COVID-19?  Marland Kitchen If you are having a medical emergency, call 911. . Seek medical care right away. Before you go to a doctor's office, urgent care or emergency department, call ahead and tell them about your recent travel, contact with someone diagnosed with COVID-19, and your symptoms. You should receive instructions from your physician's office regarding next steps of care.  . When you arrive at healthcare provider, tell the healthcare staff immediately you have returned from visiting [place], [place], [place], [place] or [place]; or traveled in the [place] to [place], [place], [place], or [place]; in the last two weeks or you have been in close contact with a person diagnosed with COVID-19 in the last 2 weeks.   . Tell the health care staff about your symptoms: fever, cough and shortness of breath. . After you have been seen by a medical provider, you will be either: o Tested for (COVID-19) and discharged home on quarantine except to seek medical care if symptoms worsen, and asked to  - Stay home and avoid contact with others until you get your results (4-5 days)  - Avoid travel on public transportation if possible (such as bus, train, or airplane) or o Sent to the Emergency Department by EMS for evaluation, COVID-19 testing, and possible admission depending on your condition and test results.  What to do if you are LOW RISK for COVID-19?  Reduce your risk of any infection by using the same precautions used for avoiding the common cold or flu:  Marland Kitchen Wash your hands often with soap and warm water for at least 20 seconds.  If soap and water are not readily available, use an alcohol-based hand sanitizer with at least 60% alcohol.  . If coughing or sneezing, cover your mouth and nose by coughing or sneezing into the elbow areas of your shirt or coat, into a tissue or into your sleeve (not your hands). . Avoid shaking hands with others and consider head nods or verbal greetings only. . Avoid touching your eyes, nose, or mouth  with unwashed hands.  . Avoid close contact with people who are sick. . Avoid places or events with large numbers of people in one location, like concerts or sporting events. . Carefully consider travel plans you have or are making. . If you are planning any travel outside or inside the [place], visit the CDC's Travelers' Health webpage for the latest health notices. . If you have some symptoms but not all symptoms, continue to monitor at home and seek medical attention if your symptoms worsen. . If you are having a medical emergency, call  911.   ADDITIONAL HEALTHCARE OPTIONS FOR PATIENTS  [facility] / e-Visit: [email or web address]         [facility]: [phone]  [facility]: [phone]                   [facility]: [phone]           It is flu season:   >>> Best ways to protect herself from the flu: Receive the yearly flu vaccine, practice good hand hygiene washing with soap and also using hand sanitizer when available, eat a nutritious meals, get adequate rest, hydrate appropriately   Please contact the office if your symptoms worsen or you have concerns that you are not improving.   Thank you for choosing [facility] for your healthcare, and for allowing [place] to partner with you on your healthcare journey. I am thankful to be able to provide care to you today.   Elisha Headland FNP-C

## 2018-08-18 NOTE — Assessment & Plan Note (Signed)
Plan: Continue to work towards healthy weight Work to reduce BMI for management of other chronic diseases Once patient has insurance could consider referral to medical weight management

## 2018-08-18 NOTE — Assessment & Plan Note (Signed)
Counseled patient on my concerns of his chronic steroid use.  Due to financial barriers at this time will have patient start back on 30 mg of prednisone daily I have given him 90 days of this medication with no refills.  Patient needs to present back to our office for further evaluation.  Further explained to the patient and his spouse that ideally we will get the patient on a steroid sparing agent due to his young age as well as already long use of chronic steroids.  Chronic Steroid Use  . places the patient at high risk for: osteoporosis, infections, diabetes or elevated glucose levels, cataracts or glaucoma, GERD/ Peptic Ulcer Disease, Heart Failure, and or peripheral edema  We try to limit the adverse effects of systemic glucocorticoids by: Marland Kitchen Using the lowest dose of glucocorticoid to the shortest period of time to achieve treatment goals . Management of pre-existing comorbid conditions that may increase risk when glucocorticoids are required . Monitoring of patients under treatment for adverse effects who may benefit from additional interventions . If for longer than 3 months you should be started on supplemental calcium and vitamin d based on [employer] of Rheumatology (ACR) Taskforce Guidelines o Calcium 1000-1200mg  a day  o Vitamin D 600-800 IU daily  . Ensure you are up to date with vaccinations  If you are using chronic glucocorticoids/steroids please make sure: . Have regular follow-up with primary care to monitor for blood sugars . Discuss bone density testing with primary care  . Notify primary care with changes in symptoms . Have regular follow-up with ophthalmology . Actively perform weightbearing exercises  . Avoid Smoking and drinking alcohol  . Take measures to prevent falls

## 2018-08-18 NOTE — Telephone Encounter (Signed)
TELE visit patient is states he is having a flare of  sarcoidosis has not been seen since 10/19. Felt he need to talk to provider. Telephone visit made nothing further needed

## 2018-08-18 NOTE — Assessment & Plan Note (Signed)
Assessment: This is been a difficult problem for the patient to manage October/2019 office notes reveal that patient was using Kratom and CBD oils for management of chronic pain We have referred the patient multiple times to [facility] for further pain management The patient reports that he continues to have issues with following up with pain management  Plan: Discuss further management of chronic pain with primary care or [facility] orthopedics

## 2018-08-18 NOTE — Assessment & Plan Note (Signed)
Assessment: Patient stopped following up with our office in November/2019 Due to lack of follow-up with our office he has been off of his prednisone as well as the methotrexate that we are trialing to start the patient on Patient has struggled with follow-up with other providers due to high cost of medications Patient reporting as well as confirm with the spouse that he is working with disability in order to get better medication coverage  Plan: Offered referral to community health and wellness, patient declined Unfortunately were quite limited with how we can treat the patient medically due to financial barriers will prescribe prescription of high-dose prednisone 30 mg daily 90-day supply Patient needs to eventually resume regular follow-up with our office so we can get him started on a steroid sparing agent which will be much safer for him due to his young age as well as other comorbidities

## 2018-08-18 NOTE — Progress Notes (Signed)
Virtual Visit via Telephone Note  I connected with Anthony [name] on 08/18/18 at 11:30 AM EDT by telephone and verified that I am speaking with the correct person using two identifiers.   I discussed the limitations, risks, security and privacy concerns of performing an evaluation and management service by telephone and the availability of in person appointments. I also discussed with the Anthony [name] that there may be a Anthony [name] responsible charge related to this service. The Anthony [name] expressed understanding and agreed to proceed.   History of Present Illness: 38 year old male Anthony [name] followed in our office for sarcoidosis.  PMH: Morbid obesity, knee pain, chronic pain management Smoker/ Smoking History: Former smoker, 3 pack years Maintenance: Prednisone 30 mg daily-Anthony [name] ran out of prednisone in November/2019, trial of methotrexate Anthony [name] stopped following up with our office so he stopped methotrexate in November/2019 Pt of: Dr. Lamonte Sakai  Anthony [name] consented to consult via telephone: Yes People present and their role in pt care: Anthony [name] and Anthony [name] spouse  Chief complaint: Sarcoidosis  3 year old former smoker followed in our office for sarcoidosis.  Anthony [name] was last seen in 2019 on high-dose prednisone and we are starting the Anthony [name] on a steroid sparing agent of methotrexate.  Unfortunately the Anthony [name] stopped following up with our office.  He reports that it was too expensive of a co-pay with his insurance.  Anthony [name] has also had difficulties following up with pain management as well as with primary care.  Anthony [name] and spouse today report the disability forms have been submitted and this should be working to be able to get the Anthony [name] better insurance.  Per Anthony [name] as well as Anthony [name]'s spouse they reported that it should be a 90-day process they are probably around  day 35 today of that process.  To hopefully at the end of the 90 days he will have insurance.  Anthony [name] reports that due to the cost  of coming into the office visits and him not following up with our office he has been off of chronic prednisone and methotrexate since November/2019.  Anthony [name] reports that now he has been having worsened shortness of breath as well as joint pain as well as the rash on his hands has started to come back.  All of which improve when Anthony [name] is managed on prednisone.  Observations/Objective:   09/22/2017-pulmonary function test- FVC 95, F/F ratio 84, FEV1 97, no significant bronchodilator change DLCO 174  Imaging:  09/10/2017-CT chest with contrast- market decrease in size of right perihilar pulmonary parenchymal nodules, and left upper lobe nodule with linear scarring  05/22/2017-CT chest without contrast- mediastinal and bilateral hilar adenopathy is decreased, scattered irregular nodular peribronchovascular fossae of consolidation in both lungs are stable to mildly decreased in size.   Cardiac:  02/11/2017-echocardiogram-LV ejection fraction 55 to 45%, systolic function is normal,  Labs:  03/17/2017-quant gold-negative 02/17/17>>>Labs showed elevated LDH at 295, ESR 29, ACE level 83, c-RP 55 (markedly elevated).  01/02/2018-hepatic function panel, basic metabolic panel, CBC with differential - all normal  No results found for: NITRICOXIDE    Assessment and Plan:  Current chronic use of systemic steroids Counseled Anthony [name] on my concerns of his chronic steroid use.  Due to financial barriers at this time will have Anthony [name] start back on 30 mg of prednisone daily I have given him 90 days of this medication with no refills.  Anthony [name] needs to present back to our office for further evaluation.  Further explained to the Anthony [name] and his spouse that ideally we will get the Anthony [name]  on a steroid sparing agent due to his young age as well as already long use of chronic steroids.  Chronic Steroid Use  . places the Anthony [name] at high risk for: osteoporosis, infections, diabetes or elevated glucose levels,  cataracts or glaucoma, GERD/ Peptic Ulcer Disease, Heart Failure, and or peripheral edema  We try to limit the adverse effects of systemic glucocorticoids by: Marland Kitchen Using the lowest dose of glucocorticoid to the shortest period of time to achieve treatment goals . Management of pre-existing comorbid conditions that may increase risk when glucocorticoids are required . Monitoring of patients under treatment for adverse effects who may benefit from additional interventions . If for longer than 3 months you should be started on supplemental calcium and vitamin d based on [employer] of Rheumatology (ACR) Taskforce Guidelines o Calcium 1000-1252m a day  o Vitamin D 600-800 IU daily  . Ensure you are up to date with vaccinations  If you are using chronic glucocorticoids/steroids please make sure: . Have regular follow-up with primary care to monitor for blood sugars . Discuss bone density testing with primary care  . Notify primary care with changes in symptoms . Have regular follow-up with ophthalmology . Actively perform weightbearing exercises  . Avoid Smoking and drinking alcohol  . 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[place] Assessment: Has been off of chronic prednisone as well as methotrexate since November/2019 due to lack of follow-up with our office Anthony [name] reporting significant financial barriers to follow-up with our office regularly Last weight in chart is 560 pounds Anthony [name] reports that skin rash on his hands has come back now that he has been off of prednisone Anthony [name] also reporting increased shortness of breath and joint pain Anthony [name] reports he is walking with a cane  Plan: We start Anthony [name] back on 30 mg of  prednisone daily, 90-day prescription with no refills Once Anthony [name] has insurance he needs to contact her office and schedule a visit with Dr. BLamonte Sakaior an APP Anthony [name] needs a steroid sparing agent for further management of his sarcoidosis and his breathing   Non compliance with medical treatment Assessment: Anthony [name] stopped following up with our office in November/2019 Due to lack of follow-up with our office he has been off of his prednisone as well as the methotrexate that we are trialing to start the Anthony [name] on Anthony [name] has struggled with follow-up with other providers due to high cost of medications Anthony [name] reporting as well as confirm with the spouse that he is working with disability in order to get better medication coverage  Plan: Offered referral to community health and wellness, Anthony [name] declined Unfortunately were quite limited with how we can treat the Anthony [name] medically due to financial barriers will prescribe prescription of high-dose prednisone 30 mg daily 90-day supply Anthony [name] needs to eventually resume regular follow-up with our office so we can get him started on a steroid sparing agent which will be much safer for him due to his young age as well as other comorbidities  Chronic pain Assessment: This is been a difficult problem for the Anthony [name] to manage October/2019 office notes reveal that Anthony [name] was using Kratom and CBD oils for management of chronic pain We have [facility] the [name] The Anthony [name] reports that he continues to have issues with following up with pain management  Plan: Discuss further management of chronic pain with primary care or [facility] orthoped[facility]     Follow Up Instructions:  Return in about 3 months (around 11/17/2018), or if symptoms worsen or fail to improve, for Follow up with Dr. Lamonte Sakai.    I discussed the assessment and treatment plan with the Anthony [name]. The Anthony [name] was provided  an opportunity to ask questions and all were answered. The Anthony [name] agreed with the plan and demonstrated an understanding of the instructions.   The Anthony [name] was advised to call back or seek an in-person evaluation if the symptoms worsen or if the condition fails to improve as anticipated.  I provided 30 minutes of non-face-to-face time during this encounter.   Lauraine Rinne, NP

## 2018-09-02 NOTE — Telephone Encounter (Signed)
See tele note created from other my chart message to follow-up regarding the message above.  Arlys John

## 2018-09-02 NOTE — Telephone Encounter (Signed)
09/02/2018 849  Triage, please route this message to Dr. Delton Coombes.  Dr. Kavin Leech see the patient's request listed below (I have combined for 3 mychart messages the patient has sent back to back):  Dr. Delton Coombes,    On April 7th, I had a tele-appointment with NP Anthony [name]. A few days after our appointment, I had an opportunity to view NP Anthony [name]'s progress notes from my visit and I noticed that he notated within my medical chart, a diagnosis of: "Non Compliance with Medical Treatment". Although I understand that my behavior has not coincided with the therapeutic plan that you initially set in place, I believe this diagnosis is inaccurate based on the fact that my not attending appointments is due to my lack of financial resources and not just a refusal to comply with my prescribed medical plan. Throughout my appointment with him, this was expressed many times by both me and my wife.   Also, within the "Plan" sub-section of the "Non Compliance with Medical Treatment" section, NP Anthony [name] states that he "offered referral to community health and wellness, patient declined" Dr. Delton Coombes, this statement is false. During my appointment, NP Anthony [name] made mention of the program and I told him that I had heard of it but after that, no further discussion was had about [employer] [facility] and an offer was not made for a referral.  Finally, within the Chronic Pain section of his notes, NP Anthony [name] states: "we have referred the patient multiple times to [facility] for further pain management" Although this statement on the surface is true, the implication may be made that I am being non-compliant in my treatmentbecause although your office has referred me to [facility] multiple times, I still have not had an appointment.   Once again, I have not been able to successfully attend an appointment with [facility] because of my financial situation. Not because I don't have a desire to follow through  with my treatment.    Dr. Delton Coombes, I am sure that a more nuanced approach can be taken to documenting my current situation with regard to my inability to attend appointments due to my current financial situation.    Although I respect you and am extremely grateful to you for your thoughtful approach to my care, I am concerned about how I am being portrayed within my medical record.    I am sending this message to you as a formal request that the "Non Compliance to Treatment" diagnosis be completely removed from my record.    Thank you,  Anthony [name]   A&P documentation from my 08/18/2018 tele-visit with the patient is also listed below:  Non compliance with medical treatment Assessment: Patient stopped following up with our office in November/2019 Due to lack of follow-up with our office he has been off of his prednisone as well as the methotrexate that we are trialing to start the patient on Patient has struggled with follow-up with other providers due to high cost of medications Patient reporting as well as confirm with the spouse that he is working with disability in order to get better medication coverage  Plan: Offered referral to community health and wellness, patient declined Unfortunately were quite limited with how we can treat the patient medically due to financial barriers will prescribe prescription of high-dose prednisone 30 mg daily 90-day supply Patient needs to eventually resume regular follow-up with our office so we can get him started on a steroid sparing agent which will be much  safer for him due to his young age as well as other comorbidities  Chronic pain Assessment: This is been a difficult problem for the patient to manage October/2019 office notes reveal that patient was using Kratom and CBD oils for management of chronic pain We have referred the patient multiple times [facility] for further pain management The patient reports that he  continues to have issues with following up with pain management  Plan: Discuss further management of chronic pain with primary care or [facility]  Triage please notify the patient that Dr. 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  We will route his request and questions to Dr. Delton CoombesByrum for further follow-up.  Anthony HeadlandBrian Rohnan Bartleson, FNP

## 2018-09-02 NOTE — Telephone Encounter (Signed)
See telephone note created based off of other my chart message.  Anthony [name]

## 2018-09-03 ENCOUNTER — Encounter: Payer: Self-pay | Admitting: Student in an Organized Health Care Education/Training Program

## 2018-09-03 NOTE — Progress Notes (Signed)
Patient's Name: Anthony [name]  MRN: [id number]  Referring Provider: Luetta Nutting, DO  DOB: December 24, 1980  PCP: Luetta Nutting, DO  DOS: 09/07/2018  Note by: Gillis Santa, MD  Service setting: Ambulatory outpatient  Specialty: Interventional Pain Management  Location: [facility] Virtual Visit  Visit type: Initial Patient Evaluation  Patient type: New Patient   Pain Management Virtual Encounter Note - Virtual Visit via Wallace (real-time audio visits between healthcare provider and patient).  Patient's Phone No.:  [phone] (home); [phone] (mobile); (Preferred) [phone] [email or web address]  [id number] - [place], [place] [address] [address] Phone: [phone] Fax: [place], [place] [address] [place] [address] Phone: [phone] Fax: [phone]   Pre-screening note:  Our staff contacted Anthony [name] and offered him an "in person", "face-to-face" appointment versus a telephone encounter. He indicated preferring the telephone encounter, at this time.  Primary Reason(s) for Visit: Tele-Encounter for initial evaluation of one or more chronic problems (new to examiner) potentially causing chronic pain, and posing a threat to normal musculoskeletal function. (Level of risk: High) CC: Back Pain (lower) and Knee Pain (bilateral)  I contacted Anthony [name] on 09/07/2018 at 11:57 AM via video conference and clearly identified myself as Gillis Santa, MD. I verified that I was speaking with the correct person using two identifiers (Name and date of birth: 1980-10-02).  Advanced Informed Consent I sought verbal advanced consent from Anthony [name] for virtual visit interactions. I informed Anthony [name] of possible security and privacy concerns, risks, and limitations associated  with providing "not-in-person" medical evaluation and management services. I also informed Anthony [name] of the availability of "in-person" appointments. Finally, I informed him that there would be a charge for the virtual visit and that he could be  personally, fully or partially, financially responsible for it. Anthony [name] expressed understanding and agreed to proceed.   HPI  Anthony [name] is a 38 y.o. year old, male patient, contacted today for an initial evaluation of his chronic pain. 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 Cause of pain: Work related accident or event, morbid obesity Severity: Getting worse, pain at its worst is 10 Timing: Not influenced by the time of the day, however worse when patient is on his feet Aggravating Factors: Bending, Kneeling, Lifiting, Motion, Prolonged standing, Twisting, Walking uphill and Walking downhill Alleviating Factors: Lying down, Medications, Resting, Sleeping, Relaxation therapy and Warm showers or baths Associated Problems: Erectile dysfunction, Fatigue,  Inability to concentrate, Personality changes, Spasms, Pain that wakes patient up and Pain that does not allow patient to sleep Quality of Pain: Aching, Agonizing,  Burning, Constant, Cramping, Disabling, Distressing, Exhausting, Feeling of weight, Horrible, Punishing, Shooting, Stabbing and Throbbing Previous Examinations or Tests: X-rays and Orthopedic evaluation Previous Treatments: Steroid treatments by mouth and Stretching exercises   Virtual visit conducted initially by video conference and was later transitioned to phone given poor audio quality on [email or web address].  38 year old male with a history of morbid obesity, chronic pain with pain generators including low back, right knee and right hip.  Patient also has a history of sarcoidosis proven by lung biopsy with multiple lung nodules.  Patient states that his low back pain is very debilitating for him.  He states that it is difficult to walk.  He finds it difficult to sleep.  He states that performing basic ADLs are very difficult for him.  He was previously a Retail buyer but sustained a work-related injury where he twisted incorrectly resulting in persistent and ongoing low back and right knee pain.  Patient has filed for disability.  During the course of our conversation, patient continued to focus on opioid medications specifically fentanyl and Percocet.  He states that fentanyl after his knee surgery provided him with pain relief and he was able to walk and perform ADLs.  Patient was previously receiving Percocet from a pain clinic in May however that provider has lost his license.  Patient has tried multiple medications in the past including anti-inflammatories such as naproxen, ibuprofen.  He denies having tried diclofenac nor is there any mention of it in his record.  Patient is tried gabapentin in the past 300 mg 3 times a day which he states was not beneficial.  Patient denies having tried physical therapy.  Patient states that he is unable  to perform an MRI given his weight.  He has not been able to find an MRI machine capable of tolerating his weight.  The patient was informed that my practice is divided into two sections: an interventional pain management section, as well as a completely separate and distinct medication management section. I explained that I have procedure days for my interventional therapies, and evaluation days for follow-ups and medication management. Because of the amount of documentation required during both, they are kept separated. This means that there is the possibility that he may be scheduled for a procedure on one day, and medication management the next. I have also informed him that because of staffing and facility limitations, I no longer take patients for medication management only. To illustrate the reasons for this, I gave the patient the example of surgeons, and how inappropriate it would be to refer a patient to his/her care, just to write for the post-surgical antibiotics on a surgery done by a different surgeon.   Because interventional pain management is my board-certified specialty, the patient was informed that joining my practice means that they are open to any and all interventional therapies. I made it clear that this does not mean that they will be forced to have any procedures done. What this means is that I believe interventional therapies to be essential part of the diagnosis and proper management of chronic pain conditions. Therefore, patients not interested in these interventional alternatives will be better served under the care of a different practitioner.  The patient was also made aware of my Comprehensive Pain Management Safety Guidelines where by joining my practice, they limit all of their nerve blocks and joint injections to those done by our practice, for as long as we are retained to manage their care.   07/14/2018  2   07/14/2018  Oxycodone-Acetaminophen 10-325  15.00 5 Co Mat    [id number]   Wal ([id number])   0  45.00 MME  Comm Ins   [place]   Prior to this patient was receiving Percocet 10 mg 3 times daily PRN, quantity 90 prescribed by Othella Boyer, MD who has lost his license according to the patient.  Medications: Bottles not available for inspection. Pharmacodynamics: Desired effects: Analgesia: The patient reports <50% benefit. Reported improvement in function: The patient reports medications have not provided significant benefit Clinically meaningful improvement in function (CMIF): Medication does not meet basic CMIF Perceived effectiveness: Described as ineffective and would like to make some changes Undesirable effects: Side-effects or Adverse reactions: None reported Historical Monitoring: The patient  reports no history of drug use. List of all UDS Test(s): No results found for: MDMA, COCAINSCRNUR, [place], [place], CANNABQUANT, [place], [place] List of other Serum/Urine Drug Screening Test(s):  No results found for: AMPHSCRSER, BARBSCRSER, BENZOSCRSER, COCAINSCRSER, COCAINSCRNUR, PCPSCRSER, PCPQUANT, THCSCRSER, THCU, CANNABQUANT, OPIATESCRSER, OXYSCRSER, PROPOXSCRSER, ETH Historical Background Evaluation: [place] PMP: PDMP not reviewed this encounter. Six (6) year initial data search conducted.             [place] Department of public safety, offender search: Editor, commissioning Information) Non-contributory Risk Assessment Profile: Aberrant behavior: claims that "nothing else works", extensive time Water quality scientist and request for specific drugs or requesting "Brand Name" drugs Risk factors for fatal opioid overdose: morbid obesity, sleep apnea, sarcoidosis, age 74 Fatal overdose hazard ratio (HR): Calculation deferred Non-fatal overdose hazard ratio (HR): Calculation deferred Risk of opioid abuse or dependence: 0.7-3.0% with doses ? 36 MME/day and 6.1-26% with doses ? 120 MME/day. Substance use disorder (SUD) risk level: Moderate Personal History of Substance Abuse  (SUD-Substance use disorder):  Alcohol: Negative  Illegal Drugs: Positive Male or Male  Rx Drugs: Negative  ORT Risk Level calculation: Moderate Risk Opioid Risk Tool - 09/03/18 1007      Family History of Substance Abuse   Alcohol  Negative    Illegal Drugs  Negative    Rx Drugs  Negative      Personal History of Substance Abuse   Alcohol  Negative    Illegal Drugs  Positive Male or Male    Rx Drugs  Negative      Age   Age between 58-45 years   No      History of Preadolescent Sexual Abuse   History of Preadolescent Sexual Abuse  Negative or Male      Psychological Disease   Psychological Disease  Negative    Depression  Positive      Total Score   Opioid Risk Tool Scoring  5    Opioid Risk Interpretation  Moderate Risk      ORT Scoring interpretation table:  Score <3 = Low Risk for SUD  Score between 4-7 = Moderate Risk for SUD  Score >8 = High Risk for Opioid Abuse   Pharmacologic Plan: Non-opioid analgesic therapy offered.            Initial impression: Poor candidate for opioid analgesics.  Meds   Current Outpatient Medications:  .  ibuprofen (ADVIL,MOTRIN) 400 MG tablet, Take 800 mg by mouth every 6 (six) hours as needed. , Disp: , Rfl:  .  Menthol, Topical Analgesic, (ICY HOT EX), Apply 1 application topically daily as needed (muscle pain)., Disp: , Rfl:  .  predniSONE (DELTASONE) 10 MG tablet, Take 3 tablets daily (6m total) daily with food in  the morning., Disp: 270 tablet, Rfl: 0 .  diclofenac (VOLTAREN) 75 MG EC tablet, Take 1 tablet (75 mg total) by mouth 2 (two) times daily. Please discontinue and refrain from all NSAIDs while on this medication. Take after meal., Disp: 60 tablet, Rfl: 0 .  gabapentin (NEURONTIN) 300 MG capsule, Take 1 capsule (300 mg total) by mouth 3 (three) times daily. (Patient not taking: Reported on 09/03/2018), Disp: 90 capsule, Rfl: 1  ROS  Cardiovascular: Chest pain Pulmonary or Respiratory: Sarcoidosis Neurological: No  reported neurological signs or symptoms such as seizures, abnormal skin sensations, urinary and/or fecal incontinence, being born with an abnormal open spine and/or a tethered spinal cord Review of Past Neurological Studies: No results found for this or any previous visit. Psychological-Psychiatric: Depressed Gastrointestinal: Reflux or heatburn Genitourinary: No reported renal or genitourinary signs or symptoms such as difficulty voiding or producing urine, peeing blood, non-functioning kidney, kidney stones, difficulty emptying the bladder, difficulty controlling the flow of urine, or chronic kidney disease Hematological: No reported hematological signs or symptoms such as prolonged bleeding, low or poor functioning platelets, bruising or bleeding easily, hereditary bleeding problems, low energy levels due to low hemoglobin or being anemic Endocrine: No reported endocrine signs or symptoms such as high or low blood sugar, rapid heart rate due to high thyroid levels, obesity or weight gain due to slow thyroid or thyroid disease Rheumatologic: Joint aches and or swelling due to excess weight (Osteoarthritis) Musculoskeletal: Negative for myasthenia gravis, muscular dystrophy, multiple sclerosis or malignant hyperthermia Work History: Disabled  Allergies  Mr. Roosevelt is allergic to xarelto [rivaroxaban].  Laboratory Chemistry  Inflammation Markers (CRP: Acute Phase) (ESR: Chronic Phase) Lab Results  Component Value Date   CRP 55.0 (H) 02/17/2017   ESRSEDRATE 29 (H) 02/17/2017                         Rheumatology Markers No results found for: RF, ANA, LABURIC, URICUR, LYMEIGGIGMAB, LYMEABIGMQN, HLAB27                      Renal Function Markers Lab Results  Component Value Date   BUN 7 06/24/2018   CREATININE 0.85 06/24/2018   GFRAA >60 01/02/2017   GFRNONAA >60 01/02/2017                             Hepatic Function Markers Lab Results  Component Value Date   AST 13 06/24/2018    ALT 14 06/24/2018   ALBUMIN 4.1 06/24/2018   ALKPHOS 52 06/24/2018                        Electrolytes Lab Results  Component Value Date   NA 139 06/24/2018   K 4.5 06/24/2018   CL 102 06/24/2018   CALCIUM 9.4 06/24/2018                        Neuropathy Markers Lab Results  Component Value Date   HGBA1C 5.9 06/24/2018                        CNS Tests No results found for: COLORCSF, APPEARCSF, RBCCOUNTCSF, WBCCSF, POLYSCSF, LYMPHSCSF, EOSCSF, PROTEINCSF, GLUCCSF, JCVIRUS, CSFOLI, IGGCSF, LABACHR, ACETBL                      Bone  Pathology Markers No results found for: Marveen Reeks, [id number], [id number], 25OHVITD1, 25OHVITD2, 25OHVITD3, TESTOFREE, TESTOSTERONE                       Coagulation Parameters Lab Results  Component Value Date   PLT 285.0 06/24/2018   DDIMER 0.89 (H) 01/02/2017                        Cardiovascular Markers Lab Results  Component Value Date   BNP 5 02/03/2017   HGB 15.2 06/24/2018   HCT 46.4 06/24/2018                         ID Markers No results found for: LYMEIGGIGMAB, HIV                      CA Markers No results found for: CEA, CA125, LABCA2                      Endocrine Markers Lab Results  Component Value Date   TSH 1.91 06/24/2018                        Note: Lab results reviewed. Knee-R DG 4 views:  Results for orders placed during the hospital encounter of 06/20/03  DG Knee Complete 4 Views Right   Narrative Clinical Data: 38 year old who fell. Right knee pain.  RIGHT KNEE, FOUR VIEWS   Joint spaces are maintained. There is early degenerative change for the patient's age involving the medial compartment with some early osteophytic lipping. There may be some type of congenital deformity involving the medial tibial plateau which appears to be at somewhat of an angle. I do not see any evidence of an acute fracture. There is no joint effusion.   IMPRESSION  1. Early degenerative change involving the medial  compartment which may be due to a congenital abnormality involving the medial tibial plateau.  2. No acute bony findings and no joint effusion.    Provider: Colleen Can   Knee-L DG 4 views:  Results for orders placed in visit on 08/09/98  DG Knee Complete 4 Views Left   Narrative FINDINGS CLINICAL DATA:    TRAUMA FIVE DAYS AGO. LEFT KNEE: THE STUDY IS LIMITED TECHNICALLY BECAUSE OF THE PATIENT'S SIZE.  I DO NOT SEE ANY DEFINITE FRACTURE.  THERE APPEARS TO BE SOME DEGENERATIVE CHANGES PRESENT AND AN OSTEOCHONDROMA INVOLVING THE PROXIMAL ASPECT OF THE LEFT LATERAL TIBIA.  NO JOINT EFFUSION EVIDENT. IMPRESSION   Foot Imaging: Foot-R DG Complete:  Results for orders placed during the hospital encounter of 02/20/05  DG Foot Complete Right   Narrative Clinical Data:  Right foot pain status post twisting injury.   RIGHT FOOT - 3 VIEWS:  Comparison: None.   Findings: There is a lucency which extends through the base of the fifth metatarsal. This results in two separate osseous fragments the margins of which appear well corticated. I suspect this either represents failure of fusion of the apophysis or an old base of fifth metatarsal fracture. Less likely this represents an acute fracture. Correlation with patient's exact site of tenderness recommended.   IMPRESSION:  Probable failure of fusion of base of fifth metatarsal apophysis. Less likely this represents an acute fracture. Correlation with exact site of tenderness is recommended.  Provider: Otis Dials   Complexity Note: Imaging results reviewed. Results shared  with Mr. Nalepa, using Layman's terms.                         PFSH  Drug: Mr. Patino  reports no history of drug use. Alcohol:  reports no history of alcohol use. Tobacco:  reports that he quit smoking about 23 months ago. His smoking use included cigarettes. He has a 3.00 pack-year smoking history. He has never used smokeless tobacco. Medical:  has a past medical history  of Anxiety, Depression, Dyspnea, Eczema of hand, Headache, Obese, Pneumonia, Pre-diabetes, Sarcoidosis, and Sleep apnea. Family: family history includes Diabetes in his father; Hypertension in his father; Lymphoma in his paternal uncle; Ovarian cancer (age of onset: 59) in his maternal grandmother; Stroke in his father.  Past Surgical History:  Procedure Laterality Date  . ARTHROSCOPY KNEE W/ DRILLING     right knee   . ENDOBRONCHIAL ULTRASOUND Bilateral 03/04/2017   Procedure: ENDOBRONCHIAL ULTRASOUND;  Surgeon: Collene Gobble, MD;  Location: [facility];  Service: Cardiopulmonary;  Laterality: Bilateral;  . left knee inner growth plate removed     to straighten leg   Active Ambulatory Problems    Diagnosis Date Noted  . Pneumonia due to infectious organism 01/16/2017  . Reactive depression 01/16/2017  . Anticoagulated by anticoagulation treatment 01/16/2017  . Abnormal chest x-ray with multiple lung nodules 02/08/2017  . 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 Mediastinal lymphadenopathy 03/04/2017  . Constipation 04/10/2017  . Obstructive sleep apnea 05/19/2017  . Morbid obesity ([place]) 09/22/2017  . Chronic pain 12/05/2017  . Testicular swelling 06/10/2018  . Well adult exam 06/24/2018  . Primary osteoarthritis of right knee 06/24/2018  . Eczema 06/24/2018  . Current chronic use of systemic steroids 08/18/2018  . Non compliance with medical treatment 08/18/2018   Resolved Ambulatory Problems    Diagnosis Date Noted  . Pressure sore of hip 11/04/2017  . Knee pain 12/05/2017   Past Medical History:  Diagnosis Date  . Anxiety   . Depression   . Dyspnea   . Eczema of hand   . Headache   . Obese   . Pneumonia   . Pre-diabetes   . Sarcoidosis   . Sleep apnea    Assessment  Primary Diagnosis & Pertinent Problem List: The primary encounter diagnosis was Chronic pain syndrome. Diagnoses of Morbid obesity ([place]), Sarcoidosis of lung with  sarcoidosis of lymph nodes ([place]), Primary osteoarthritis of right knee, Chronic right-sided low back pain with right-sided sciatica, Chronic pain of both knees, and Right hip pain were also pertinent to this visit.  Visit Diagnosis (New problems to examiner): 1. Chronic pain syndrome   2. Morbid obesity ([place])   3. 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 Primary osteoarthritis of right knee   5. Chronic right-sided low back pain with right-sided sciatica   6. Chronic pain of both knees   7. Right hip pain    Plan of Care (Initial workup plan)   I had an extensive discussion with the patient regarding pain management and treatment plan.  In my professional opinion, a large component of the patient's chronic low back pain and bilateral knee pain is secondary to his morbid obesity.  Patient's weight is 550 pounds and has a BMI of 83.63.  I explained to the patient that losing weight will have a significant impact on his chronic pain.  I encouraged the patient to perform physical  therapy and even start with aquatic therapy to help with weight loss however the patient was not interested in PT referral.    I also informed the patient that to better understand the etiology of his back pain, imaging would be needed however given his weight, our radiology facility is unable to handle his weight.  Furthermore in terms of interventional options, I informed the patient that our fluoroscopy table used to perform interventional pain procedures would NOT  be able to handle his weight.  The patient spent a great deal of time discussing opioid medications and became somewhat aggressive about the need for these medications.  I clearly and directly informed the patient that he would not be a candidate for chronic opioid therapy at this clinic given his morbid obesity, history of sarcoidosis, risk factors for obstructive sleep apnea, and the risk of respiratory depression associated with opioids  medications.    Furthermore, the patient is  38 years old and could benefit from non-opioid analgesics while trying to lose weight.  I also offered the patient referral to a weight loss clinic however the patient states that he was already referred to one.  The patient specifically asked about a fentanyl patch stating that he received fentanyl after his right knee surgery which provided him with noticeable pain relief.  I educated and informed the patient regarding fentanyl and how this could be effective for postoperative pain and also for malignancy (cancer related pain).  This would not be a good medication for this patient given his morbid obesity and risk of respiratory depression in the context of obstructive sleep apnea.  Towards the end of our phone conversation, the patient's tone became more aggressive and he started to interrupt my treatment plan.  The patient was angry and frustrated that I would not consider opioid medications as a part of his treatment plan however I explained why this was the case and how the risks outweighed the benefit.  There were times during the phone encounter when the patient was displaying drug-seeking behavior as he was constantly talking about opioid medications, the need for something "stronger", asking specifically for "Fentanyl", and interrupting me on many occasions as I was explaining to him alternatives to help manage his pain including psychology assisted biofeedback, pain coping strategies, referral to physical therapy starting with aquatic therapy and consideration of NSAIDs.  I did offer the patient diclofenac that he can take to help manage his pain.  I explained to him that diclofenac has better absorption in knee synovial fluid and that this could be more effective than ibuprofen in helping to manage his pain.  I informed the patient to discontinue and refrain from all NSAIDs while he is on diclofenac.  The patient was not pleased with the treatment plan  as he was expecting opioid medication.  Patient requested a transcript/copy of the note.  At this point, I do not have much to offer the patient as it seems that he is only interested in opioid medications- mentioning Fentanyl specifically.    Patient can follow-up with his primary care physician for medication management as he will not be an opioid or interventional pain management candidate here given concerns listed above but primarily due to his morbid obesity, risk factors for obstructive sleep apnea, risk of respiratory depression, age, and some concern of drug seeking behavior.  I encouraged the patient to reach out to his previous weight loss clinic as loosing weight will significantly improve his chronic pain.  Pharmacotherapy (current):  Medications ordered:  Meds ordered this encounter  Medications  . DISCONTD: diclofenac (VOLTAREN) 75 MG EC tablet    Sig: Take 1 tablet (75 mg total) by mouth 2 (two) times daily.    Dispense:  60 tablet    Refill:  1  . diclofenac (VOLTAREN) 75 MG EC tablet    Sig: Take 1 tablet (75 mg total) by mouth 2 (two) times daily. Please discontinue and refrain from all NSAIDs while on this medication. Take after meal.    Dispense:  60 tablet    Refill:  0   Medications administered during this visit: Damany J. Olenick had no medications administered during this visit.    Provider-requested follow-up: Follow up with PCP  Total duration of non-face-to-face encounter: 45 minutes.  Primary Care Physician: Luetta Nutting, DO Location: [facility] Note by: Gillis Santa, MD Date: 09/07/2018; Time: 11:57 AM

## 2018-09-07 ENCOUNTER — Other Ambulatory Visit: Payer: Self-pay

## 2018-09-07 ENCOUNTER — Ambulatory Visit
Payer: Self-pay | Attending: Student in an Organized Health Care Education/Training Program | Admitting: Student in an Organized Health Care Education/Training Program

## 2018-09-07 VITALS — Wt >= 6400 oz

## 2018-09-07 DIAGNOSIS — M25551 Pain in right hip: Secondary | ICD-10-CM

## 2018-09-07 DIAGNOSIS — M5441 Lumbago with sciatica, right side: Secondary | ICD-10-CM

## 2018-09-07 DIAGNOSIS — M25561 Pain in right knee: Secondary | ICD-10-CM

## 2018-09-07 DIAGNOSIS — G8929 Other chronic pain: Secondary | ICD-10-CM

## 2018-09-07 DIAGNOSIS — D862 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: Secondary | ICD-10-CM

## 2018-09-07 DIAGNOSIS — M1711 Unilateral primary osteoarthritis, right knee: Secondary | ICD-10-CM

## 2018-09-07 DIAGNOSIS — G894 Chronic pain syndrome: Secondary | ICD-10-CM

## 2018-09-07 DIAGNOSIS — M25562 Pain in left knee: Secondary | ICD-10-CM

## 2018-09-07 MED ORDER — DICLOFENAC SODIUM 75 MG PO TBEC: 75.0000 mg | DELAYED_RELEASE_TABLET | Freq: Two times a day (BID) | ORAL | 1 refills | Status: DC

## 2018-09-07 MED ORDER — DICLOFENAC SODIUM 75 MG PO TBEC: 75.0000 mg | DELAYED_RELEASE_TABLET | Freq: Two times a day (BID) | ORAL | 0 refills | Status: DC

## 2018-10-22 ENCOUNTER — Encounter: Payer: Self-pay | Admitting: Family Medicine

## 2018-10-22 ENCOUNTER — Telehealth: Payer: Self-pay | Admitting: Family Medicine

## 2018-10-22 NOTE — Telephone Encounter (Unsigned)
Copied from [place] [id number]. Topic: General - Other >> Oct 22, 2018  4:06 PM Celene Kras A wrote: Reason for CRM: Pt called and states he is needing a referral to pain management. Pt is requesting to have referral sent to a Dr. Mirna Mires at [facility]. Pt states he got in contact with them and the had no referral. Please advise.

## 2018-10-23 NOTE — Telephone Encounter (Signed)
It appears that Anthony [name] closed the referral, can we check with her when she returns next week.

## 2018-12-08 ENCOUNTER — Other Ambulatory Visit: Payer: Self-pay | Admitting: Student in an Organized Health Care Education/Training Program

## 2019-01-11 ENCOUNTER — Other Ambulatory Visit: Payer: Self-pay | Admitting: Student in an Organized Health Care Education/Training Program

## 2019-01-13 ENCOUNTER — Other Ambulatory Visit: Payer: Self-pay | Admitting: Student in an Organized Health Care Education/Training Program

## 2019-01-27 ENCOUNTER — Encounter: Payer: Self-pay | Admitting: Emergency Medicine

## 2019-01-29 NOTE — Telephone Encounter (Signed)
Closing message.

## 2019-02-03 ENCOUNTER — Ambulatory Visit: Payer: Self-pay | Admitting: Emergency Medicine

## 2019-02-15 ENCOUNTER — Ambulatory Visit: Payer: Self-pay | Admitting: Emergency Medicine

## 2019-03-01 ENCOUNTER — Encounter: Payer: Self-pay | Admitting: Emergency Medicine

## 2019-03-01 ENCOUNTER — Other Ambulatory Visit: Payer: Self-pay

## 2019-03-01 ENCOUNTER — Ambulatory Visit (INDEPENDENT_AMBULATORY_CARE_PROVIDER_SITE_OTHER): Payer: Self-pay | Admitting: Emergency Medicine

## 2019-03-01 ENCOUNTER — Telehealth: Payer: Self-pay | Admitting: Emergency Medicine

## 2019-03-01 DIAGNOSIS — G894 Chronic pain syndrome: Secondary | ICD-10-CM

## 2019-03-01 DIAGNOSIS — D862 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: Secondary | ICD-10-CM

## 2019-03-01 MED ORDER — PREDNISONE 10 MG PO TABS: ORAL_TABLET | ORAL | 0 refills | Status: DC

## 2019-03-01 NOTE — Telephone Encounter (Signed)
Need to try to call the patient's pain management physician Burtis Junes [phone] to determine what is pain management options are.  Clearly his obesity is the biggest barrier to narcotics.  Sarcoidosis (either active or quiescent) is probably a barrier also

## 2019-03-01 NOTE — Patient Instructions (Addendum)
Please take prednisone 30mg  daily for the next 3 weeks, then take 20mg  for 5 days, then 10mg  for 5 days, then stop.  CXR today Follow with Dr Lamonte Sakai in 4-5 weeks  We will determine timing of a repeat CT chest at your next visit. We will try to defer until after you get health insurance. We will also discuss possibly being on steroid-sparing medications.  Follow with Dr Lamonte Sakai in 4 weeks or sooner if you have any problems.

## 2019-03-01 NOTE — Assessment & Plan Note (Addendum)
Sarcoidosis with evidence for active disease based on evolving rash on both hands which is characteristics of past flares.  He has been off any maintenance medication due to insurance barriers, cost barriers with coming to appointments etc.  I think he needs to be treated now for a flare for the next 3 weeks, then depending on how well we are able to follow-up reliably we could consider retrying methotrexate.  He does not believe he is going to be able to get a CT scan of his chest to let me evaluate for active pulmonary disease until after January when he has health insurance.  I would like to do so at that time if we are able.  For now I will check a chest x-ray.   Please take prednisone 30mg  daily for the next 3 weeks, then take 20mg  for 5 days, then 10mg  for 5 days, then stop.  CXR today Follow with Dr Lamonte Sakai in 4-5 weeks  We will determine timing of a repeat CT chest at your next visit. We will try to defer until after you get health insurance. We will also discuss possibly being on steroid-sparing medications.  Follow with Dr Lamonte Sakai in 4 weeks or sooner if you have any problems.

## 2019-03-01 NOTE — Progress Notes (Signed)
Subjective:    Patient ID: Anthony [name], male    DOB: [birth year], 38 y.o.   MRN: [id number]  HPI   ROV 03/01/2019 -- 38 yo obese man with sarcoidosis that has caused pulmonary infiltrates, mediastinal adenopathy and also cutaneous manifestations especially on both hands.  Also with OSA on CPAP.  His other significant medical problem is chronic pain due to osteoarthritis, weight affecting his knees.  This has been difficult for him to manage as he has had trouble establishing with pain clinic.  I have tried starting him on steroid sparing methotrexate in the past but we had to stop this because we had difficulty with adequate follow-up, lab work etc.  I last saw him a year ago.  He has seen B.  Warner Mccreedy here in the interval.  Last CT chest was 09/11/2017.  He weighs 542 pounds, makes CT chest complicated.   One of his chief concerns is his chronic back and lower extremity pain.  He has been seen by multiple pain management physicians with understandable concerns about narcotics in the setting of his obesity, obesity hypoventilation syndrome.  I do not necessarily think that his sarcoidosis is a large barrier but it is a contributor to respiratory insufficiency.  Difficult for me to characterize to what extent in absence of CT chest.   Anthony [name] is asking me for narcotics today.  Most recently followed by Dr. Burtis Junes, [phone].  I can contact Dr. Primus Bravo and try to help him come up with a plan to address his pain   Review of Systems  Constitutional: Positive for appetite change and unexpected weight change. Negative for fever.  HENT: Positive for congestion and dental problem. Negative for ear pain, nosebleeds, postnasal drip, rhinorrhea, sinus pressure, sneezing, sore throat and trouble swallowing.   Eyes: Negative for redness and itching.  Respiratory: Positive for cough and shortness of breath. Negative for chest tightness and wheezing.   Cardiovascular: Positive for chest pain.  Negative for palpitations and leg swelling.  Gastrointestinal: Positive for abdominal pain. Negative for nausea and vomiting.  Genitourinary: Negative for dysuria.  Musculoskeletal: Negative for joint swelling.  Skin: Negative for rash.  Neurological: Positive for headaches.  Hematological: Does not bruise/bleed easily.  Psychiatric/Behavioral: Positive for dysphoric mood. The patient is nervous/anxious.      Objective:   Physical Exam Vitals:   03/01/19 1056  BP: (!) 142/90  Pulse: 72  SpO2: 98%  Weight: (!) 542 lb (245.8 kg)  Height: 5\' 8"  (1.727 m)   Gen: Pleasant, obese man, in no distress,  normal affect  ENT: No lesions,  mouth clear,  oropharynx clear, no postnasal drip  Neck: No JVD, no stridor  Lungs: No use of accessory muscles, clear without rales or rhonchi  Cardiovascular: RRR, heart sounds normal, no murmur or gallops, no peripheral edema  Musculoskeletal: No deformities, no cyanosis or clubbing  Neuro: alert, non focal  Skin: Warm, healing patchy dryness on his R hand.     CT chest 09/11/17 --  COMPARISON:  CT 05/22/2017  FINDINGS: Cardiovascular: No significant vascular findings. Normal heart size. No pericardial effusion.  Mediastinum/Nodes: No axillary supraclavicular adenopathy. Mild mediastinal adenopathy similar. For example RIGHT paratracheal lymph node measures 12 mm short axis compared to 14 mm remeasured. Prevascular lymph node measures 7 mm compared with 8 mm. No new or enlarged mediastinal lymph nodes. No hilar adenopathy.  Esophagus normal.  No pericardial effusion  Lungs/Pleura: Pulmonary nodules have decreased in size compared to  prior. For example LEFT upper lobe nodule presents as thin linear scarring measuring 6 mm x 12 mm decreased from nodule lesion measuring 25 x 18 mm (image 39/5).  Large perihilar nodules in the RIGHT lower lobe of also decreased in size with platelike linear scarring remaining. Example RIGHT upper  lobe nodule/linear scarring measuring 27 x 19 mm decreased from 38 x 30 mm. Similar findings in the RIGHT lower lobe. No new nodularity. Airways are normal.  Upper Abdomen: Limited view of the liver, kidneys, pancreas are unremarkable. Normal adrenal glands.  Musculoskeletal: No aggressive osseous lesion.  IMPRESSION: 1. Marked decrease in size of RIGHT perihilar pulmonary parenchymal nodules and LEFT upper lobe nodule with linear platelike scarring remaining. 2. 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  He has been off any maintenance medication due to insurance barriers, cost barriers with coming to appointments etc.  I think he needs to be treated now for a flare for the next 3 weeks, then depending on how well we are able to follow-up reliably we could consider retrying methotrexate.  He does not believe he is going to be able to get a CT scan of his chest to let me evaluate for active pulmonary disease until after January when he has health insurance.  I would like to do so at that time if we are able.  For now I will check a chest x-ray.   Please take prednisone 30mg  daily for the next 3 weeks, then take 20mg  for 5 days, then 10mg  for 5 days, then stop.  CXR today Follow with Dr in 4-5 weeks  We will determine timing of a repeat CT chest at your next visit. We will try to defer until after you get health insurance. We will also discuss possibly being on steroid-sparing medications.  Follow with Dr in 4 weeks or sooner if you have any problems.  Chronic pain Unfortunate this is a big problem for Anthony [name] due to osteoarthritis in the knee, the back.  Relates to past injuries and also to the wear and tear on his joints due to his obesity.  He  has had a very difficult time establishing with a stable pain management clinic due to his insurance coverage.  There have also been barriers to treating his pain due to the complicating issue of his obesity hypoventilation.  Is not clear to me his pulmonary sarcoid is active right now, but it may be.  This would also be a risk factor for taking narcotics.  Anthony [name] asked me today if I could give him narcotics to tide him over until he can establish more stable plan.  Unfortunately I am not able to do that, am not comfortable managing those medications for him.  I will try to help him maintain a relationship with the pain medicine clinic.  The most recent he has seen is with Dr. Delton Coombes, [phone].  I can try to call Dr. Vita Barley to see what Anthony [name] options are.  [phone], MD, PhD 03/01/2019, 5:29 PM [facility] [phone] or if no answer [phone]

## 2019-03-01 NOTE — Assessment & Plan Note (Signed)
Unfortunate this is a big problem for Anthony [name] due to osteoarthritis in the knee, the back.  Relates to past injuries and also to the wear and tear on his joints due to his obesity.  He has had a very difficult time establishing with a stable pain management clinic due to his insurance coverage.  There have also been barriers to treating his pain due to the complicating issue of his obesity hypoventilation.  Is not clear to me his pulmonary sarcoid is active right now, but it may be.  This would also be a risk factor for taking narcotics.  Anthony [name] asked me today if I could give him narcotics to tide him over until he can establish more stable plan.  Unfortunately I am not able to do that, am not comfortable managing those medications for him.  I will try to help him maintain a relationship with the pain medicine clinic.  The most recent he has seen is with Dr. Burtis Junes, [phone].  I can try to call Dr. Primus Bravo to see what Anthony [name] options are.

## 2019-03-02 ENCOUNTER — Telehealth: Payer: Self-pay | Admitting: Emergency Medicine

## 2019-03-02 NOTE — Telephone Encounter (Signed)
Called and spoke with patient.  Patient received his Prednisone taper prescription today with #30 tabs, and directions of 4 tabs x's 3 days, 3 tabs x's 3 days, 2 tabs x's 3 days, 1 tab x's 3 days. Advised patient to go by RB's instructions on AVS. [facility] Dr.  Per Tye Maryland, Pharmacist, for insurance purposes, she advised Patient take Prednisone #30 he has, with RB's AVS instructions. Once his prescription is 3 days from complete, have new prescription called into pharmacy, with RB's instructions. Called patient with Pharmacy instructions.  Patient stated he has 10mg  tabs and will take as instructed by RB on AVS. Patient stated he will call office for a new  Prednisone prescription with continuing instructions.   Per 10/19 AVS with RB-  Please take prednisone 30mg  daily for the next 3 weeks, then take 20mg  for 5 days, then 10mg  for 5 days, then stop.  CXR today Follow with Dr Lamonte Sakai in 4-5 weeks  We will determine timing of a repeat CT chest at your next visit. We will try to defer until after you get health insurance. We will also discuss possibly being on steroid-sparing medications.  Follow with Dr Lamonte Sakai in 4 weeks or sooner if you have any problems.

## 2019-03-11 ENCOUNTER — Telehealth: Payer: Self-pay | Admitting: Emergency Medicine

## 2019-03-11 MED ORDER — PREDNISONE 10 MG PO TABS: ORAL_TABLET | ORAL | 0 refills | Status: DC

## 2019-03-11 NOTE — Telephone Encounter (Signed)
Spoke with the pt  Not enough pred sent at the last visit  To do taper as directed  I went over taper with him again and he read back to me   Instructions  Please take prednisone 30mg  daily for the next 3 weeks, then take 20mg  for 5 days, then 10mg  for 5 days, then stop.  CXR today      More pred sent  Nothing further needed

## 2019-03-17 NOTE — Telephone Encounter (Signed)
I called Dr. Ethel Rana office, spoke to the office staff and nursing.  They have no record that Mr. Orlich has been seen in their office to date.

## 2019-03-29 ENCOUNTER — Telehealth: Payer: Self-pay | Admitting: Emergency Medicine

## 2019-03-29 MED ORDER — PREDNISONE 10 MG PO TABS: ORAL_TABLET | ORAL | 0 refills | Status: DC

## 2019-03-29 NOTE — Telephone Encounter (Signed)
Call returned to patient, confirmed DOB, he states he has now completed the 30mg  x 3 weeks of his prednisone. He states he just needs the 20mg  x5 days and 10 mg x5 days sent in to complete his taper. I made him aware I would have to get signed of by provider and we would give him a call back once we had it sent in.   Beth please advise if we can send reminder of taper Prednisone 20mg  x 5 days then 10mg  x5 days then stop, under your name. Thanks.

## 2019-03-29 NOTE — Telephone Encounter (Signed)
Sent!

## 2019-03-29 NOTE — Telephone Encounter (Signed)
Spoke with pt, aware of recs and rx being sent in.  Nothing further needed at this time- will close encounter.

## 2019-04-13 ENCOUNTER — Ambulatory Visit (INDEPENDENT_AMBULATORY_CARE_PROVIDER_SITE_OTHER): Payer: Self-pay | Admitting: Emergency Medicine

## 2019-04-13 ENCOUNTER — Encounter: Payer: Self-pay | Admitting: Emergency Medicine

## 2019-04-13 ENCOUNTER — Other Ambulatory Visit: Payer: Self-pay

## 2019-04-13 ENCOUNTER — Ambulatory Visit: Payer: Self-pay | Admitting: Emergency Medicine

## 2019-04-13 DIAGNOSIS — M1711 Unilateral primary osteoarthritis, right knee: Secondary | ICD-10-CM

## 2019-04-13 DIAGNOSIS — D862 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: Secondary | ICD-10-CM

## 2019-04-13 DIAGNOSIS — G4733 Obstructive sleep apnea (adult) (pediatric): Secondary | ICD-10-CM

## 2019-04-13 MED ORDER — DICLOFENAC SODIUM ER 100 MG PO TB24: 100.0000 mg | ORAL_TABLET | Freq: Every day | ORAL | 0 refills | Status: DC

## 2019-04-13 NOTE — Assessment & Plan Note (Signed)
Good compliance with his CPAP.  Continue same.  His machine is old and will likely need to be replaced.  We can discuss this when he gets his insurance.

## 2019-04-13 NOTE — Progress Notes (Signed)
Virtual Visit via Telephone Note  I connected with Anthony [name] on 04/13/19 at 10:30 AM EST by telephone and verified that I am speaking with the correct person using two identifiers.  Location: Patient: Home Provider: office    I discussed the limitations, risks, security and privacy concerns of performing an evaluation and management service by telephone and the availability of in person appointments. I also discussed with the patient that there may be a patient responsible charge related to this service. The patient expressed understanding and agreed to proceed.   History of Present Illness: 38 year old gentleman with a history of obesity with OSA (on CPAP), chronic back and lower extremity pain, whom I have followed for sarcoidosis with pulmonary infiltrates as well as cutaneous manifestations especially on the palms of both hands.    Observations/Objective: Last saw him 03/01/2019 at which time he was having progressive rash on his hands that was characteristics of past flares.  He had difficulty staying on methotrexate due to unreliable follow-up.  I treated him with prednisone 30 mg daily for 3 weeks with a plan to taper to 0 over the subsequent 10 days.  He is currently off prednisone, since last week. His rash is 95% better. Had some improvement in his overall functional capacity, his breathing.  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  I did explain to him that I am concerned that this may only be temporary and that he may need to go back to maintenance medications.  He will have insurance in January.  At that time we will work on obtaining a CT scan of his chest, have an office visit to rediscuss possible maintenance therapy with methotrexate, check labs etc.  Obstructive sleep apnea Good compliance with his CPAP.  Continue  same.  His machine is old and will likely need to be replaced.  We can discuss this when he gets his insurance.  Primary osteoarthritis of right knee He got some temporary relief from the prednisone.  He is working on reestablishing with a pain clinic.  He has asked me to give him a prescription for diclofenac to tide him over until this can be accomplished.  I will give him a month supply but do not feel comfortable managing long-term given the potential side effects of renal dysfunction, gastritis, etc.    Follow Up Instructions: January 2021   I discussed the assessment and treatment plan with the patient. The patient was provided an opportunity to ask questions and all were answered. The patient agreed with the plan and demonstrated an understanding of the instructions.   The patient was advised to call back or seek an in-person evaluation if the symptoms worsen or if the condition fails to improve as anticipated.  I provided 25 minutes of non-face-to-face time during this encounter.   Collene Gobble, MD

## 2019-04-13 NOTE — Assessment & Plan Note (Signed)
Good clinical response to treatment with 1 month of prednisone.  I did explain to him that I am concerned that this may only be temporary and that he may need to go back to maintenance medications.  He will have insurance in January.  At that time we will work on obtaining a CT scan of his chest, have an office visit to rediscuss possible maintenance therapy with methotrexate, check labs etc.

## 2019-04-13 NOTE — Assessment & Plan Note (Signed)
He got some temporary relief from the prednisone.  He is working on reestablishing with a pain clinic.  He has asked me to give him a prescription for diclofenac to tide him over until this can be accomplished.  I will give him a month supply but do not feel comfortable managing long-term given the potential side effects of renal dysfunction, gastritis, etc.

## 2019-04-15 IMAGING — DX DG CHEST 2V
2 series · 4 of 4 positions shown · non-contrast
Comparison: Chest radiograph January 17, 2017 and chest CT September 10, 2017

CLINICAL DATA: Sarcoidosis

EXAM:
CHEST - 2 VIEW

[Series 1: chest pa · 0.14mm/px · 2 of 2 slices shown]
[im 1/2]
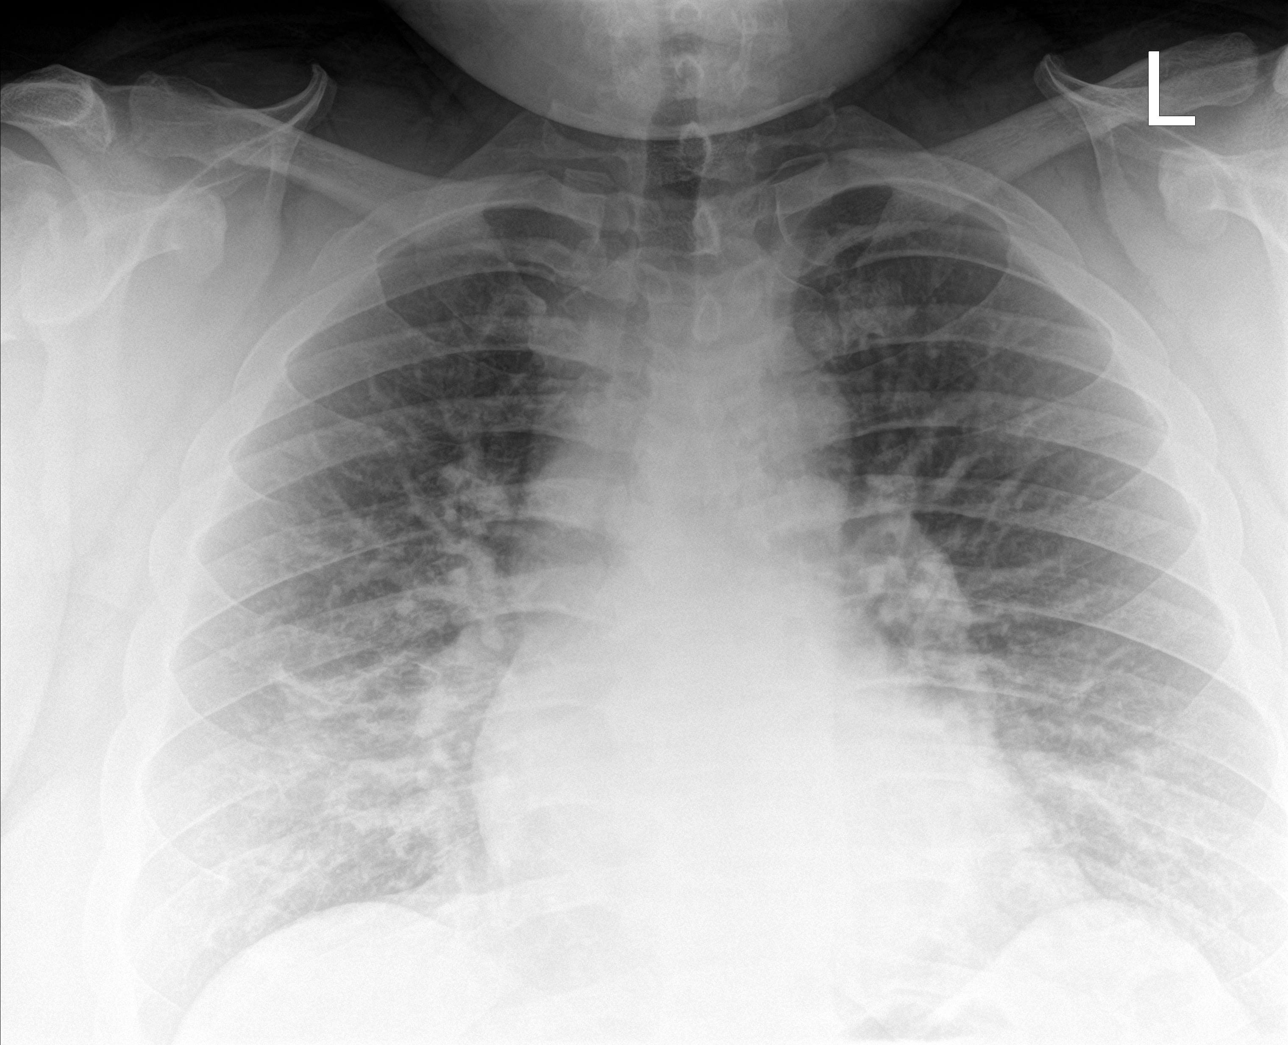
[im 2/2]
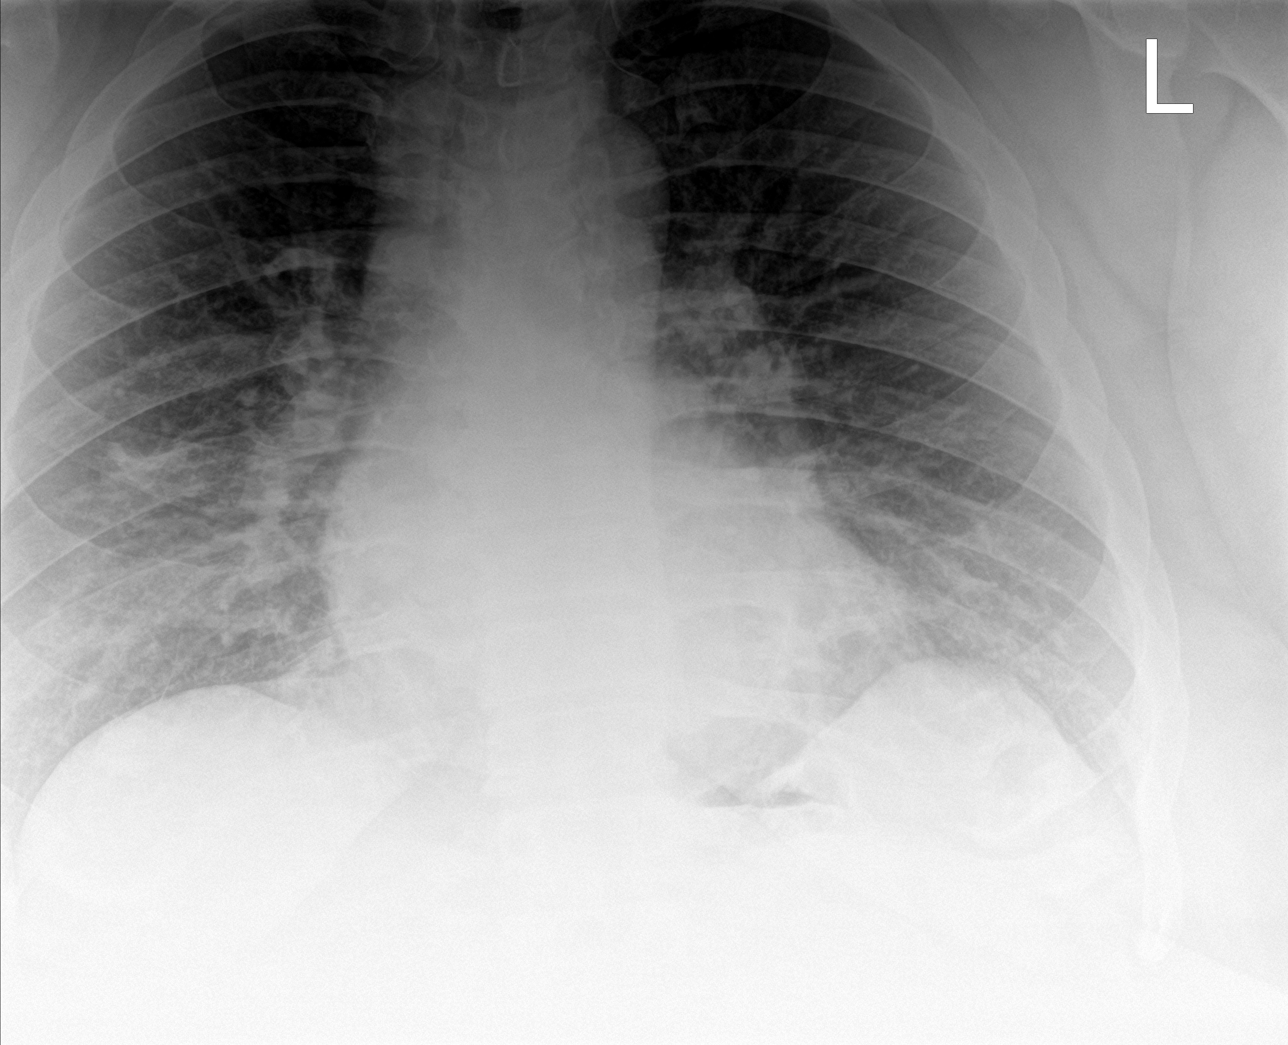

[Series 2: chest lat · 0.14mm/px · 2 of 2 slices shown]
[im 1/2]
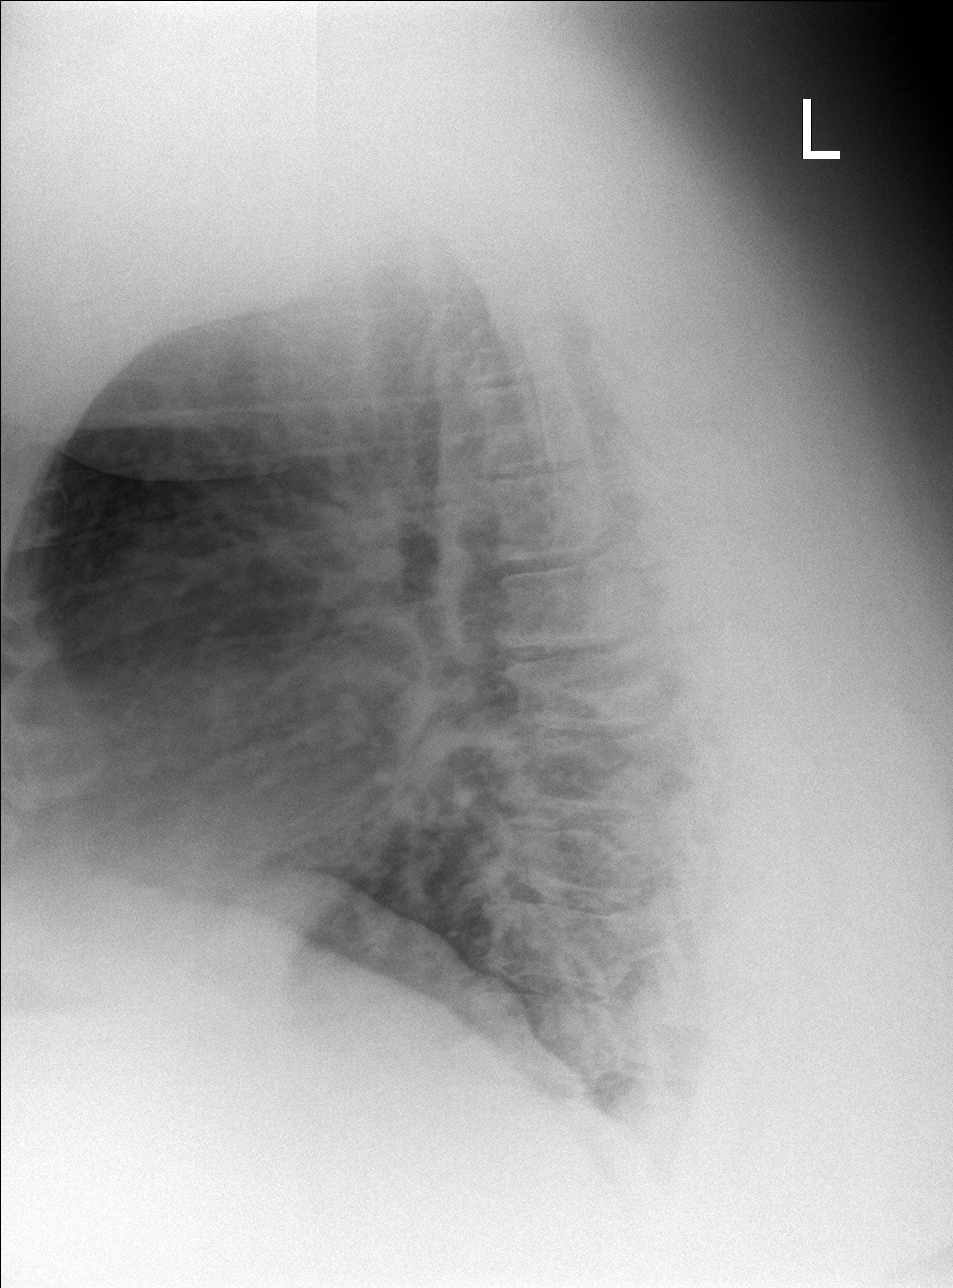
[im 2/2]
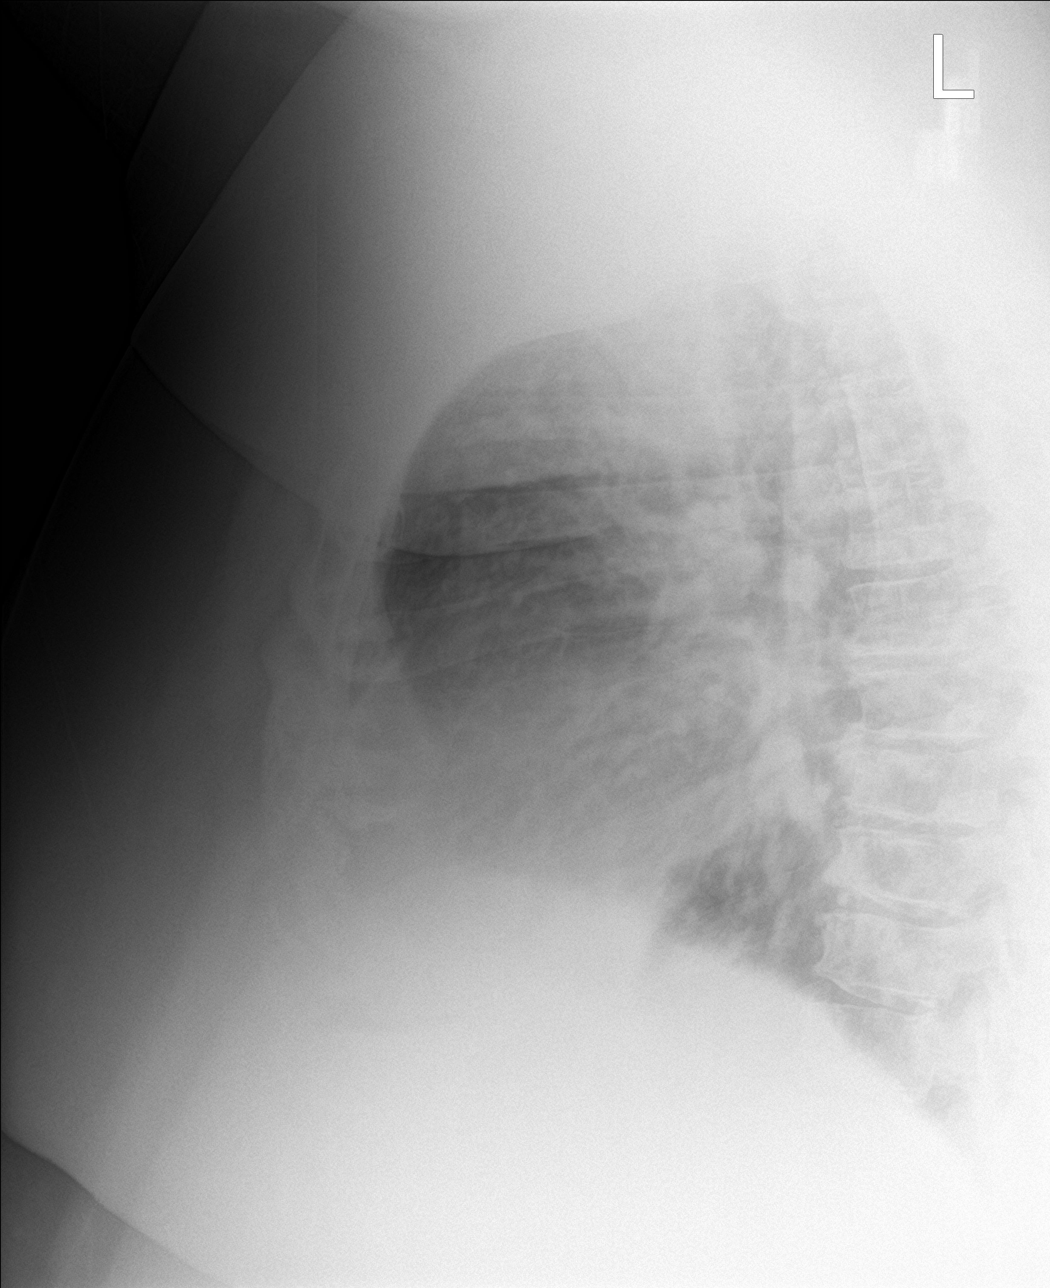

[4 of 4 positions shown; findings below may reference images not displayed]

FINDINGS: Remains reticulonodular interstitial prominence without progression.
Ill-defined nodular opacity seen in left upper lobe on prior study
has resolved. Currently no consolidation.

Heart is upper normal in size with pulmonary vascularity normal.
There is no adenopathy appreciable by radiography. There is stable
slight anterior wedging of a midthoracic vertebral body.
IMPRESSION: Persistent reticulonodular interstitial prominence without
progression. No consolidation. No new or enlarging nodular opacity.
Heart upper normal in size. No overt adenopathy by radiography.

## 2019-05-24 ENCOUNTER — Encounter: Payer: Self-pay | Admitting: Emergency Medicine

## 2019-05-24 ENCOUNTER — Telehealth: Payer: Self-pay | Admitting: Emergency Medicine

## 2019-05-24 ENCOUNTER — Telehealth (INDEPENDENT_AMBULATORY_CARE_PROVIDER_SITE_OTHER): Payer: Self-pay | Admitting: Emergency Medicine

## 2019-05-24 DIAGNOSIS — D862 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: Secondary | ICD-10-CM

## 2019-05-24 DIAGNOSIS — G4733 Obstructive sleep apnea (adult) (pediatric): Secondary | ICD-10-CM

## 2019-05-24 MED ORDER — PREDNISONE 10 MG PO TABS: ORAL_TABLET | ORAL | 0 refills | Status: DC

## 2019-05-24 NOTE — Patient Instructions (Signed)
Prednisone 30 mg daily for 3 weeks and then go to 20 mg daily indefinitely while we plan to start methotrexate. Lab work in 3 weeks. Follow-up visit in 3 weeks to discuss plans, methotrexate regimen.  As the methotrexate gets started we will slowly come down on the prednisone Defer CT chest for now.  Plan to get a baseline CT chest once we have him on a stable anti-inflammatory regimen

## 2019-05-24 NOTE — Telephone Encounter (Signed)
I have placed an order for [employer] to provide CPAP supplies to the patient.

## 2019-05-24 NOTE — Assessment & Plan Note (Signed)
Poor compliance with his CPAP currently because of some equipment issues.  He is working with his DME to get the proper equipment.  He will call me if other barriers evolve

## 2019-05-24 NOTE — Assessment & Plan Note (Signed)
Sarcoidosis with pulmonary, lymphadenopathy, cutaneous manifestations.  He flares when he is not on therapy.  I think he is going to need to be on steroid sparing medication, he did get some benefit from methotrexate in the past but we had to stop it due to his compliance with lab work, office visits.  He does now have insurance and hopefully this will not be such a barrier.  I will treat him with prednisone 30 mg daily for 3 weeks, then go to 20 mg daily and stay at that dose.  At that time he will have initial lab work and then plan to start methotrexate which I will ramp-up.  As we do so I will come down on the prednisone.  Hopefully methotrexate can be a long-term medication for him.  I am going to defer CT chest right now because I do not think this will influence whether we decide to treat or not.  Instead I will get a CT chest once he is on a stable regimen, at baseline to assess his pulmonary infiltrates.

## 2019-05-24 NOTE — Progress Notes (Signed)
Virtual Visit via Video Note  I connected with Anthony [name] on 05/24/19 at  9:30 AM EST by a video enabled telemedicine application and verified that I am speaking with the correct person using two identifiers.  Location: Patient: Home  Provider: Office   I discussed the limitations of evaluation and management by telemedicine and the availability of in person appointments. The patient expressed understanding and agreed to proceed.  History of Present Illness: [name] is a 39 year old morbidly obese man with OSA/OHS, followed for sarcoidosis with adenopathy, pulmonary infiltrates and cutaneous manifestations especially on the palms of both hands.  We have tried in the past to treat him with steroid sparing agents due to persistent symptoms but difficulty staying on methotrexate due to poor follow-up, only intermittent lab work, etc.  I treated him for a flare in October with 3 weeks of prednisone.  Based on our follow-up visit in December he did get a good clinical response.  I had planned for a repeat CT scan to be done once he gets his medical insurance, goal January 2021.   Observations/Objective: Reports that his breathing is unfortunately worse.  Its back to the point that it was before we treated with prednisone.  He is having exertional dyspnea, difficulty climbing stairs.. The rash on his hands has completely returned, affecting mainly the palms of his hands  He is not using CPAP as reliably right now because he needs new equipment.  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  He flares when he is not on therapy.  I think he is going to need to be on steroid sparing medication, he did get some benefit from methotrexate in the past but we had to stop it due to his compliance with lab work, office visits.  He does now have  insurance and hopefully this will not be such a barrier.  I will treat him with prednisone 30 mg daily for 3 weeks, then go to 20 mg daily and stay at that dose.  At that time he will have initial lab work and then plan to start methotrexate which I will ramp-up.  As we do so I will come down on the prednisone.  Hopefully methotrexate can be a long-term medication for him.  I am going to defer CT chest right now because I do not think this will influence whether we decide to treat or not.  Instead I will get a CT chest once he is on a stable regimen, at baseline to assess his pulmonary infiltrates.  Obstructive sleep apnea Poor compliance with his CPAP currently because of some equipment issues.  He is working with his DME to get the proper equipment.  He will call me if other barriers evolve     Follow Up Instructions: 3-4 weeks virtual or in person   I discussed the assessment and treatment plan with the patient. The patient was provided an opportunity to ask questions and all were answered. The patient agreed with the plan and demonstrated an understanding of the instructions.   The patient was advised to call back or seek an in-person evaluation if the symptoms worsen or if the condition fails to improve as anticipated.  I provided 20 minutes of non-face-to-face time during this encounter.   Leslye Peer, MD

## 2019-07-06 ENCOUNTER — Telehealth: Payer: Self-pay | Admitting: Emergency Medicine

## 2019-07-06 NOTE — Telephone Encounter (Signed)
ATC, NA had to [facility]- needs appt

## 2019-07-06 NOTE — Telephone Encounter (Signed)
Okay to refill the patient's prednisone to ensure that he is maintained on it.  Patient absolutely needs to have follow-up with an NP or Dr. Delton Coombes moving forward to ensure he has regular follow-up.  I believe that it would be best for him to have an in person office visit not video visit.   We can wait to see Dr. Kavin Leech thoughts regarding this.  If patient is scheduled and NP please schedule an appointment with Dr. Delton Coombes 4 weeks after it.  Elisha Headland, FNP

## 2019-07-06 NOTE — Telephone Encounter (Signed)
Message routed to app of the day, Elisha Headland, NP and Dr. Delton Coombes.  Anthony [name], this patient had a video visit with Dr. Delton Coombes on 05/24/19, and office visit on 04/13/19.  Called [facility] [phone] and confirmed the patient did pick up the prednisone 10 mg prescription sent on 05/24/19.  I called the patient and he stated that he lost the remaining prescription of prednisone yesterday. He normally keeps the medicine in bag with his other things and if fell out without knowing it. Patient stated he checked his car and his home and cannot find it. He is down to the 20 mg daily.  Can he get a refill of the prednisone?   Dr. Delton Coombes, based on your current schedule there will not be a spot available for you to see the patient for follow up until 07/30/19. Can he have a video visit with an NP? Based on your last note, the patient was to follow up in 3 - 4 weeks.  The patient wants to know what the next step is regarding the Methotrexate since he is on the 20 mg prednisone daily right now.

## 2019-07-07 MED ORDER — PREDNISONE 10 MG PO TABS: ORAL_TABLET | ORAL | 0 refills | Status: DC

## 2019-07-07 NOTE — Telephone Encounter (Signed)
Called and spoke to pt. Informed him of the recs per Elisha Headland, NP. Pt verbalized understanding and is aware we will call him back about his next OV (virtual vs in person and NP vs RB). Will await Dr. Kavin Leech response.

## 2019-07-07 NOTE — Telephone Encounter (Signed)
Spoke with the pt and have scheduled appt with Beth for 09/06/19

## 2019-07-07 NOTE — Telephone Encounter (Signed)
He needs to be seen as an office visit by either Jawara Latorre or APP, will need a CBC and LFT at that appointment, plan to start methotrexate at that time.  We will then do a crossover with the methotrexate and prednisone as we wean the prednisone off.

## 2019-07-09 ENCOUNTER — Other Ambulatory Visit: Payer: Self-pay

## 2019-07-09 ENCOUNTER — Encounter: Payer: Self-pay | Admitting: Primary Care

## 2019-07-09 ENCOUNTER — Ambulatory Visit (INDEPENDENT_AMBULATORY_CARE_PROVIDER_SITE_OTHER): Payer: 59 | Admitting: Primary Care

## 2019-07-09 VITALS — BP 118/76 | HR 84 | Temp 97.5°F | Ht 68.0 in | Wt >= 6400 oz

## 2019-07-09 DIAGNOSIS — G4733 Obstructive sleep apnea (adult) (pediatric): Secondary | ICD-10-CM | POA: Diagnosis not present

## 2019-07-09 DIAGNOSIS — Z9989 Dependence on other enabling machines and devices: Secondary | ICD-10-CM

## 2019-07-09 DIAGNOSIS — D862 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: Secondary | ICD-10-CM

## 2019-07-09 LAB — CBC WITH DIFFERENTIAL/PLATELET
Basophils Absolute: 0.1 10*3/uL (ref 0.0–0.1)
Basophils Relative: 0.5 % (ref 0.0–3.0)
Eosinophils Absolute: 0 10*3/uL (ref 0.0–0.7)
Eosinophils Relative: 0.3 % (ref 0.0–5.0)
HCT: 45.8 % (ref 39.0–52.0)
Hemoglobin: 14.9 g/dL (ref 13.0–17.0)
Lymphocytes Relative: 20.5 % (ref 12.0–46.0)
Lymphs Abs: 2.4 10*3/uL (ref 0.7–4.0)
MCHC: 32.5 g/dL (ref 30.0–36.0)
MCV: 92.3 fl (ref 78.0–100.0)
Monocytes Absolute: 0.6 10*3/uL (ref 0.1–1.0)
Monocytes Relative: 5 % (ref 3.0–12.0)
Neutro Abs: 8.6 10*3/uL — ABNORMAL HIGH (ref 1.4–7.7)
Neutrophils Relative %: 73.7 % (ref 43.0–77.0)
Platelets: 285 10*3/uL (ref 150.0–400.0)
RBC: 4.96 Mil/uL (ref 4.22–5.81)
RDW: 14.2 % (ref 11.5–15.5)
WBC: 11.6 10*3/uL — ABNORMAL HIGH (ref 4.0–10.5)

## 2019-07-09 LAB — BASIC METABOLIC PANEL
BUN: 9 mg/dL (ref 6–23)
CO2: 29 mEq/L (ref 19–32)
Calcium: 9.3 mg/dL (ref 8.4–10.5)
Chloride: 104 mEq/L (ref 96–112)
Creatinine, Ser: 0.81 mg/dL (ref 0.40–1.50)
GFR: 129.35 mL/min (ref >60.00)
Glucose, Bld: 116 mg/dL — ABNORMAL HIGH (ref 70–99)
Potassium: 3.8 mEq/L (ref 3.5–5.1)
Sodium: 139 mEq/L (ref 135–145)

## 2019-07-09 NOTE — Telephone Encounter (Signed)
Called [facility] this morning to see if patient received CPAP. They stated that patient has not got his CPAP yet. They called him to set it up and he denied it and told them he was going to have to contact his insurance and call them back and has not called them back to get it. Will follow up with patient today at scheduled appointment.

## 2019-07-09 NOTE — Patient Instructions (Addendum)
Recommendations: - Labs today as ordered (CBC and BMET) - Plan to start methotrexate after labs return next week - Continue CPAP every night 4-6 hours or more, do not drive if experiencing excessive daytime fatigue (call [facility] to check on new CPAP machine/supplies)  Follow-up: - 6 weeks with Dr. Lamonte Sakai     CPAP and BPAP Information CPAP and BPAP are methods of helping a person breathe with the use of air pressure. CPAP stands for "continuous positive airway pressure." BPAP stands for "bi-level positive airway pressure." In both methods, air is blown through your nose or mouth and into your air passages to help you breathe well. CPAP and BPAP use different amounts of pressure to blow air. With CPAP, the amount of pressure stays the same while you breathe in and out. With BPAP, the amount of pressure is increased when you breathe in (inhale) so that you can take larger breaths. Your health care provider will recommend whether CPAP or BPAP would be more helpful for you. Why are CPAP and BPAP treatments used? CPAP or BPAP can be helpful if you have:  Sleep apnea.  Chronic obstructive pulmonary disease (COPD).  Heart failure.  Medical conditions that weaken the muscles of the chest including muscular dystrophy, or neurological diseases such as amyotrophic lateral sclerosis (ALS).  Other problems that cause breathing to be weak, abnormal, or difficult. CPAP is most commonly used for obstructive sleep apnea (OSA) to keep the airways from collapsing when the muscles relax during sleep. How is CPAP or BPAP administered? Both CPAP and BPAP are provided by a small machine with a flexible plastic tube that attaches to a plastic mask. You wear the mask. Air is blown through the mask into your nose or mouth. The amount of pressure that is used to blow the air can be adjusted on the machine. Your health care provider will determine the pressure setting that should be used based on your individual  needs. When should CPAP or BPAP be used? In most cases, the mask only needs to be worn during sleep. Generally, the mask needs to be worn throughout the night and during any daytime naps. People with certain medical conditions may also need to wear the mask at other times when they are awake. Follow instructions from your health care provider about when to use the machine. What are some tips for using the mask?   Because the mask needs to be snug, some people feel trapped or closed-in (claustrophobic) when first using the mask. If you feel this way, you may need to get used to the mask. One way to do this is by holding the mask loosely over your nose or mouth and then gradually applying the mask more snugly. You can also gradually increase the amount of time that you use the mask.  Masks are available in various types and sizes. Some fit over your mouth and nose while others fit over just your nose. If your mask does not fit well, talk with your health care provider about getting a different one.  If you are using a mask that fits over your nose and you tend to breathe through your mouth, a chin strap may be applied to help keep your mouth closed.  The CPAP and BPAP machines have alarms that may sound if the mask comes off or develops a leak.  If you have trouble with the mask, it is very important that you talk with your health care provider about finding a way to  make the mask easier to tolerate. Do not stop using the mask. Stopping the use of the mask could have a negative impact on your health. What are some tips for using the machine?  Place your CPAP or BPAP machine on a secure table or stand near an electrical outlet.  Know where the on/off switch is located on the machine.  Follow instructions from your health care provider about how to set the pressure on your machine and when you should use it.  Do not eat or drink while the CPAP or BPAP machine is on. Food or fluids could get pushed  into your lungs by the pressure of the CPAP or BPAP.  Do not smoke. Tobacco smoke residue can damage the machine.  For home use, CPAP and BPAP machines can be rented or purchased through home health care companies. Many different brands of machines are available. Renting a machine before purchasing may help you find out which particular machine works well for you.  Keep the CPAP or BPAP machine and attachments clean. Ask your health care provider for specific instructions. Get help right away if:  You have redness or open areas around your nose or mouth where the mask fits.  You have trouble using the CPAP or BPAP machine.  You cannot tolerate wearing the CPAP or BPAP mask.  You have pain, discomfort, and bloating in your abdomen. Summary  CPAP and BPAP are methods of helping a person breathe with the use of air pressure.  Both CPAP and BPAP are provided by a small machine with a flexible plastic tube that attaches to a plastic mask.  If you have trouble with the mask, it is very important that you talk with your health care provider about finding a way to make the mask easier to tolerate. This information is not intended to replace advice given to you by your health care provider. Make sure you discuss any questions you have with your health care provider. Document Revised: 08/19/2018 Document Reviewed: 03/18/2016 Elsevier Patient Education  2020 [employer].

## 2019-07-09 NOTE — Assessment & Plan Note (Signed)
-   Patient reports compliance with CPAP, airview not available - Order for new supplies sent to [facility] in January  - Encourage patient to wear every night for 4-6 hours or more

## 2019-07-09 NOTE — Assessment & Plan Note (Signed)
-   Pulmonary and derm symptoms have improved since starting oral steriods. Currently on Prednisone 20mg  daily - Plan change patient to methotrexate after labs drawn today and results available for review (slowly taper down prednisone while ramping up MXT). Goal is for patient to be on methotrexate long term.  - Will get CT chest once patient is on stable regimen to assess baseline pulmonary infiltrate

## 2019-07-09 NOTE — Progress Notes (Signed)
@Patient  ID: Anthony [name], male    DOB: 1980-05-26, 39 y.o.   MRN: [id number]  Chief Complaint  Patient presents with  . Follow-up    Patient is here for follow up for Sarcoidosis and OSA. Patient has not got his CPAP yet. Patient states that he feels much better since being on Prednisone everyday.     Referring provider: Luetta Nutting, DO  HPI: 39 year old male, former smoker (3-pack-year history).  Past medical history significant for sarcoidosis, obstructive sleep apnea, pulmonary infiltrates, mediastinal lymphadenopathy, chronic anticoagulation, morbid obesity.  Patient of Dr. Lamonte Sakai, last seen for video visit on May 24, 2019.    Previously tried on steroid sparing medication due to persistent symptoms but he had difficulty staying on methotrexate due to inconsistent follow-up and lab work. He currently on prednisone 30 mg daily x3 weeks; taper to 20 mg daily until next follow-up.  At that time recommending labs and plan restart methotrexate. CT chest deferred at this time until stable regimen established then check basline imaging to assess pulmonary infiltrates.   07/09/2019 Patient presents today for 4 to 6-week follow-up visit. His breathing (and skin) have been a lot better since being on prednisone. He is currently taking 20mg  daily. He is due for labs today. States that he actually never took prescribed methotrexate d/t insurance coverage. He reports compliance with CPAP, order for new supplies sent to [place] but patient has not received. States that he can not sleep without wearing his CPAP mask. He has [employer] device to clean his supplies. Airview not available. Epworth score is 1. Denies fever, significant cough, chest tightness or pain.   Allergies  Allergen Reactions  . Xarelto [Rivaroxaban]     Bleeding gums, weakness, eye swelling/bluring vision, intensified joint pain     There is no immunization history on file for this patient.  Past Medical History:    Diagnosis Date  . Anxiety   . Depression   . Dyspnea   . Eczema of hand   . Headache    hx of with tooth pain  . Obese   . Pneumonia    12/2016  . Pre-diabetes   . Sarcoidosis   . Sleep apnea    cpap    Tobacco History: Social History   Tobacco Use  Smoking Status Former Smoker  . Packs/day: 0.30  . Years: 10.00  . Pack years: 3.00  . Types: Cigarettes  . Quit date: 09/30/2016  . Years since quitting: 2.7  Smokeless Tobacco Never Used   Counseling given: Not Answered   Outpatient Medications Prior to Visit  Medication Sig Dispense Refill  . Diclofenac Sodium CR 100 MG 24 hr tablet Take 1 tablet (100 mg total) by mouth daily. 30 tablet 0  . ibuprofen (ADVIL,MOTRIN) 400 MG tablet Take 800 mg by mouth every 6 (six) hours as needed.     . Menthol, Topical Analgesic, (ICY HOT EX) Apply 1 application topically daily as needed (muscle pain).    . predniSONE (DELTASONE) 10 MG tablet Take 20mg  daily 60 tablet 0   No facility-administered medications prior to visit.   Review of Systems  Review of Systems  Constitutional: Negative.   Respiratory: Negative.    Physical Exam  BP 118/76 (BP Location: Left Arm, Patient Position: Sitting, Cuff Size: Large)   Pulse 84   Temp (!) 97.5 F (36.4 C) (Temporal)   Ht 5\' 8"  (1.727 m)   Wt (!) 558 lb 9.6 oz (253.4 kg)   SpO2 97%  BMI 84.93 kg/m  Physical Exam Constitutional:      General: He is not in acute distress.    Appearance: Normal appearance. He is obese. He is not ill-appearing.  HENT:     Head: Normocephalic and atraumatic.     Mouth/Throat:     Mouth: Mucous membranes are moist.     Pharynx: Oropharynx is clear.  Cardiovascular:     Rate and Rhythm: Normal rate and regular rhythm.  Pulmonary:     Effort: Pulmonary effort is normal.     Breath sounds: Normal breath sounds. No wheezing or rhonchi.  Skin:    General: Skin is warm and dry.     Findings: No erythema, lesion or rash.     Comments: Dry skin on  palms improved  Neurological:     General: No focal deficit present.     Mental Status: He is alert and oriented to person, place, and time. Mental status is at baseline.  Psychiatric:        Mood and Affect: Mood normal.        Behavior: Behavior normal.        Thought Content: Thought content normal.        Judgment: Judgment normal.      Lab Results:  CBC    Component Value Date/Time   WBC 11.6 (H) 07/09/2019 1500   RBC 4.96 07/09/2019 1500   HGB 14.9 07/09/2019 1500   HGB 14.6 02/17/2017 1111   HCT 45.8 07/09/2019 1500   HCT 44.3 02/17/2017 1111   PLT 285.0 07/09/2019 1500   PLT 278 02/17/2017 1111   MCV 92.3 07/09/2019 1500   MCV 88.0 02/17/2017 1111   MCH 29.0 02/17/2017 1111   MCH 29.4 01/17/2017 1025   MCHC 32.5 07/09/2019 1500   RDW 14.2 07/09/2019 1500   RDW 14.1 02/17/2017 1111   LYMPHSABS 2.4 07/09/2019 1500   LYMPHSABS 1.5 02/17/2017 1111   MONOABS 0.6 07/09/2019 1500   MONOABS 1.0 (H) 02/17/2017 1111   EOSABS 0.0 07/09/2019 1500   EOSABS 0.4 02/17/2017 1111   BASOSABS 0.1 07/09/2019 1500   BASOSABS 0.1 02/17/2017 1111    BMET    Component Value Date/Time   NA 139 07/09/2019 1500   NA 138 02/17/2017 1111   K 3.8 07/09/2019 1500   K 4.2 02/17/2017 1111   CL 104 07/09/2019 1500   CO2 29 07/09/2019 1500   CO2 28 02/17/2017 1111   GLUCOSE 116 (H) 07/09/2019 1500   GLUCOSE 91 02/17/2017 1111   BUN 9 07/09/2019 1500   BUN 7.1 02/17/2017 1111   CREATININE 0.81 07/09/2019 1500   CREATININE 0.9 02/17/2017 1111   CALCIUM 9.3 07/09/2019 1500   CALCIUM 9.6 02/17/2017 1111   GFRNONAA >60 01/02/2017 1805   GFRAA >60 01/02/2017 1805    BNP    Component Value Date/Time   BNP 5 02/03/2017 1009    ProBNP No results found for: PROBNP  Imaging: No results found.   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 Currently on Prednisone 20mg   daily - Plan change patient to methotrexate after labs drawn today and results available for review (slowly taper down prednisone while ramping up MXT). Goal is for patient to be on methotrexate long term.  - Will get CT chest once patient is on stable regimen to assess baseline pulmonary infiltrate    Obstructive sleep apnea -  Patient reports compliance with CPAP, airview not available - Order for new supplies sent to [facility] in January  - Encourage patient to wear every night for 4-6 hours or more    Glenford Bayley, NP 07/09/2019

## 2019-08-04 MED ORDER — PREDNISONE 10 MG PO TABS: ORAL_TABLET | ORAL | 0 refills | Status: DC

## 2019-08-04 MED ORDER — METHOTREXATE 2.5 MG PO TABS: ORAL_TABLET | ORAL | 0 refills | Status: AC

## 2019-08-04 NOTE — Telephone Encounter (Signed)
Yes, I will refill. Will send in prescription for methotrexate. Can push back follow-up 2-4 weeks  Instructions: Take prednisone 15mg  x 2 weeks; then 10mg  x 2 weeks Methotrexate 2.5mg  weekly x 2 weeks; then 5mg  x 2 weeks  Follow-up: 2-4 weeks

## 2019-08-04 NOTE — Telephone Encounter (Signed)
Beth, can you please advise on his prednisone? I know he had some labs done in Feb to see if he could start the methotrexate. He is almost out of pred. Please advise, thanks!

## 2019-08-07 ENCOUNTER — Telehealth: Payer: Self-pay | Admitting: Internal Medicine

## 2019-08-07 NOTE — Telephone Encounter (Signed)
paient paged via answering service to [facility] MD on call (me). By tthe time I called him back some 30 min later he said pharmacy addressed his methotrexate supply issue and nothing was needed

## 2019-08-20 ENCOUNTER — Ambulatory Visit (INDEPENDENT_AMBULATORY_CARE_PROVIDER_SITE_OTHER): Payer: 59 | Admitting: Emergency Medicine

## 2019-08-20 ENCOUNTER — Encounter: Payer: Self-pay | Admitting: Emergency Medicine

## 2019-08-20 ENCOUNTER — Other Ambulatory Visit: Payer: Self-pay

## 2019-08-20 DIAGNOSIS — G4733 Obstructive sleep apnea (adult) (pediatric): Secondary | ICD-10-CM | POA: Diagnosis not present

## 2019-08-20 DIAGNOSIS — D862 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: Secondary | ICD-10-CM

## 2019-08-20 MED ORDER — FOLIC ACID 1 MG PO TABS: 1.0000 mg | ORAL_TABLET | Freq: Every day | ORAL | 5 refills | Status: DC

## 2019-08-20 NOTE — Patient Instructions (Addendum)
We will start methotrexate 7.5 mg once weekly. Take folic acid 1 mg daily, except for the day you take the methotrexate Continue prednisone 20 mg daily for now Your CPAP mask every night while sleeping. Follow with Dr. Delton Coombes or APP in 2 weeks. You will have labs done that day and we will then decide whether to increase the methotrexate.

## 2019-08-20 NOTE — Assessment & Plan Note (Signed)
Continue CPAP nightly. °

## 2019-08-20 NOTE — Assessment & Plan Note (Signed)
Plan to transition from maintenance prednisone to methotrexate.  The goal will be to come off prednisone altogether.  His CBC, BMP, LFTs were acceptable.  The barrier has been concern regarding the safety of methotrexate when he and his wife are trying to start a family.  I did a literature search and the consensus is that there is no increased risk of teratogenicity, spontaneous abortion, any other complication.  Based on that we are going to start methotrexate 7.5 mg weekly, folate 1 mg daily.  I will have him follow-up in 2 weeks with CBC, LFT to ensure stability.  At that point we should be able to up titrate by 2.5 mg every 2 weeks.  The goal will be 12.5-15 mg weekly.  The maximum dose would approach 30 mg weekly.  Hopefully we will start to see some improvement in his rash, dyspnea.  If so I will start weaning the prednisone.  Plan to continue to follow frequent labs until we get him on a stable dose.

## 2019-08-20 NOTE — Progress Notes (Signed)
   Subjective:    Patient ID: Anthony [name], male    DOB: 05/25/1980, 39 y.o.   MRN: [id number]  HPI 39 year old man with a history of sarcoidosis, associated lymphadenopathy and pulmonary parenchymal disease, cutaneous disease especially on the palms of his hands.  Treated him for flaring symptoms at the beginning of 2021 with high-dose prednisone, weaned ultimately to 20 mg daily with a plan to transition to methotrexate.  The transition has not happened.  He has been concerned about being on the MTX as he and his wife are trying to have children. Good CPAP compliance  He is currently on prednisone 20mg  qd. Has slowly noticed some worsening in the rash on his palms, dyspnea. He is clearly better than he was before being on any Prednisone.   MDM:  labwork reviewed 07/09/19 Office notes 2/26 reviewed Literature reviewed regarding safety of MTX in paternity    Review of Systems As per hpi     Objective:   Physical Exam Vitals:   08/20/19 0903  BP: 134/84  Pulse: 98  SpO2: 98%  Weight: (!) 567 lb (257.2 kg)  Height: 5\' 8"  (1.727 m)   Gen: Pleasant, morbidly obese man, in no distress,  normal affect  ENT: No lesions,  mouth clear,  oropharynx clear, no postnasal drip  Neck: No JVD, no stridor  Lungs: No use of accessory muscles, no crackles or wheezing on normal respiration, no wheeze on forced expiration  Cardiovascular: RRR, heart sounds normal, no murmur or gallops, no peripheral edema  Musculoskeletal: No deformities, no cyanosis or clubbing  Neuro: alert, awake, non focal  Skin: He has a dry mixed hypopigmented with areas of dark rash on the palms of his hands, more so on his right hand than his left.  There is some skin cracking.      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  The goal will be to come off prednisone altogether.  His CBC, BMP, LFTs were acceptable.   The barrier has been concern regarding the safety of methotrexate when he and his wife are trying to start a family.  I did a literature search and the consensus is that there is no increased risk of teratogenicity, spontaneous abortion, any other complication.  Based on that we are going to start methotrexate 7.5 mg weekly, folate 1 mg daily.  I will have him follow-up in 2 weeks with CBC, LFT to ensure stability.  At that point we should be able to up titrate by 2.5 mg every 2 weeks.  The goal will be 12.5-15 mg weekly.  The maximum dose would approach 30 mg weekly.  Hopefully we will start to see some improvement in his rash, dyspnea.  If so I will start weaning the prednisone.  Plan to continue to follow frequent labs until we get him on a stable dose.  Obstructive sleep apnea Continue CPAP nightly   , MD, PhD 08/20/2019, 5:16 PM [facility] [phone] or if no answer [phone]

## 2019-09-03 ENCOUNTER — Ambulatory Visit: Payer: 59 | Admitting: Primary Care

## 2019-09-03 NOTE — Telephone Encounter (Signed)
ATC patient about canceling appt and to get him rescheduled LMTCB. Appointment has been canceled will wait for patient to call back. Nothing further needed at this time.

## 2019-09-08 MED ORDER — METHOTREXATE 2.5 MG PO TABS: 7.5000 mg | ORAL_TABLET | ORAL | 1 refills | Status: DC

## 2019-09-08 MED ORDER — PREDNISONE 20 MG PO TABS: 20.0000 mg | ORAL_TABLET | Freq: Every day | ORAL | 1 refills | Status: DC

## 2019-09-08 MED ORDER — PREDNISONE 10 MG PO TABS: ORAL_TABLET | ORAL | 0 refills | Status: DC

## 2019-09-08 NOTE — Telephone Encounter (Signed)
Upon further inspection of patient's chart, RB wanted to wean patient off of prednisone and begin Methotrexate with Folic acid.  Folic acid was sent at time of ov, methotrexate was not. Rx sent as stated in the patient instructions of 7.5mg  once weekly.  Also, previous Rx for prednisone has been changed to 20mg  daily as also stated in patient instructions.  This change has been called to , spoke with pharmacist [facility].  Patient was to come in 2 weeks after previous appt - pt cancelled his 4/23 appt with RB.  Patient recommended today to reschedule.  RB has openings tomorrow.  If patient is unable to come tomorrow for appt (or have virtual visit) will schedule with APP at patient's convenience.  Med list updated  Patient Instructions We will start methotrexate 7.5 mg once weekly. Take folic acid 1 mg daily, except for the day you take the methotrexate Continue prednisone 20 mg daily for now Your CPAP mask every night while sleeping. Follow with Dr. 5/23 or APP in 2 weeks. You will have labs done that day and we will then decide whether to increase the methotrexate.

## 2019-09-08 NOTE — Addendum Note (Signed)
Addended by: Boone Master E on: 09/08/2019 10:17 AM   Modules accepted: Orders

## 2019-10-08 MED ORDER — METHOTREXATE 2.5 MG PO TABS: 7.5000 mg | ORAL_TABLET | ORAL | 0 refills | Status: DC

## 2019-10-13 MED ORDER — PREDNISONE 20 MG PO TABS: 20.0000 mg | ORAL_TABLET | Freq: Every day | ORAL | 1 refills | Status: DC

## 2019-10-13 NOTE — Addendum Note (Signed)
Addended by: Maurene Capes on: 10/13/2019 12:29 PM   Modules accepted: Orders

## 2019-11-05 ENCOUNTER — Ambulatory Visit (INDEPENDENT_AMBULATORY_CARE_PROVIDER_SITE_OTHER): Payer: 59 | Admitting: Emergency Medicine

## 2019-11-05 ENCOUNTER — Encounter: Payer: Self-pay | Admitting: Emergency Medicine

## 2019-11-05 ENCOUNTER — Other Ambulatory Visit: Payer: Self-pay

## 2019-11-05 DIAGNOSIS — D862 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: Secondary | ICD-10-CM | POA: Diagnosis not present

## 2019-11-05 LAB — CBC WITH DIFFERENTIAL/PLATELET
Basophils Absolute: 0.1 10*3/uL (ref 0.0–0.1)
Basophils Relative: 0.7 % (ref 0.0–3.0)
Eosinophils Absolute: 0.2 10*3/uL (ref 0.0–0.7)
Eosinophils Relative: 1.7 % (ref 0.0–5.0)
HCT: 44.8 % (ref 39.0–52.0)
Hemoglobin: 14.8 g/dL (ref 13.0–17.0)
Lymphocytes Relative: 36.1 % (ref 12.0–46.0)
Lymphs Abs: 3.5 10*3/uL (ref 0.7–4.0)
MCHC: 33.1 g/dL (ref 30.0–36.0)
MCV: 94.3 fl (ref 78.0–100.0)
Monocytes Absolute: 0.8 10*3/uL (ref 0.1–1.0)
Monocytes Relative: 8.2 % (ref 3.0–12.0)
Neutro Abs: 5.1 10*3/uL (ref 1.4–7.7)
Neutrophils Relative %: 53.3 % (ref 43.0–77.0)
Platelets: 264 10*3/uL (ref 150.0–400.0)
RBC: 4.75 Mil/uL (ref 4.22–5.81)
RDW: 14.3 % (ref 11.5–15.5)
WBC: 9.6 10*3/uL (ref 4.0–10.5)

## 2019-11-05 LAB — COMPREHENSIVE METABOLIC PANEL
ALT: 23 U/L (ref 0–53)
AST: 16 U/L (ref 0–37)
Albumin: 4 g/dL (ref 3.5–5.2)
Alkaline Phosphatase: 54 U/L (ref 39–117)
BUN: 9 mg/dL (ref 6–23)
CO2: 31 mEq/L (ref 19–32)
Calcium: 9.3 mg/dL (ref 8.4–10.5)
Chloride: 101 mEq/L (ref 96–112)
Creatinine, Ser: 1.01 mg/dL (ref 0.40–1.50)
GFR: 100.09 mL/min (ref >60.00)
Glucose, Bld: 93 mg/dL (ref 70–99)
Potassium: 3.7 mEq/L (ref 3.5–5.1)
Sodium: 140 mEq/L (ref 135–145)
Total Bilirubin: 0.4 mg/dL (ref 0.2–1.2)
Total Protein: 7.2 g/dL (ref 6.0–8.3)

## 2019-11-05 MED ORDER — FOLIC ACID 1 MG PO TABS: 1.0000 mg | ORAL_TABLET | Freq: Every day | ORAL | 2 refills | Status: DC

## 2019-11-05 NOTE — Patient Instructions (Signed)
Lab work today Next week please increase your methotrexate to 10 mg (4 pills) once weekly.  Stay on this dose for 3 weeks and then increase to 12.5 mg (5 pills) once weekly and stay at that dose Continue your folic acid as you have been taking it Continue prednisone 20 mg daily.  We will try to adjust this downward at your next visit Follow with Dr Delton Coombes in 6 to 8 weeks

## 2019-11-05 NOTE — Progress Notes (Signed)
   Subjective:    Patient ID: Anthony [name], male    DOB: [birth year], 39 y.o.   MRN: [id number]  HPI 39 year old man with a history of sarcoidosis, associated lymphadenopathy and pulmonary parenchymal disease, cutaneous disease especially on the palms of his hands.  Treated him for flaring symptoms at the beginning of 2021 with high-dose prednisone, weaned ultimately to 20 mg daily with a plan to transition to methotrexate.  The transition has not happened.  He has been concerned about being on the MTX as he and his wife are trying to have children. Good CPAP compliance  He is currently on prednisone 20mg  qd. Has slowly noticed some worsening in the rash on his palms, dyspnea. He is clearly better than he was before being on any Prednisone.   R OV 11/05/2019 --39 year old man follows up today for sarcoidosis with associated pulmonary disease, lymphadenopathy, cutaneous disease.  He also has a history of OSA on CPAP.  At his last visit in April we planned to initiate transition to methotrexate. He has been on 7.5mg  qweek for over a month.    Review of Systems As per hpi     Objective:   Physical Exam Vitals:   11/05/19 1000  BP: (!) 142/80  Pulse: 70  Temp: 98.4 F (36.9 C)  TempSrc: Oral  SpO2: 97%  Weight: (!) 584 lb 3.2 oz (265 kg)  Height: 5\' 8"  (1.727 m)   Gen: Pleasant, morbidly obese man, in no distress,  normal affect  ENT: No lesions,  mouth clear,  oropharynx clear, no postnasal drip  Neck: No JVD, no stridor  Lungs: No use of accessory muscles, no crackles or wheezing on normal respiration, no wheeze on forced expiration  Cardiovascular: RRR, heart sounds normal, no murmur or gallops, no peripheral edema  Musculoskeletal: No deformities, no cyanosis or clubbing  Neuro: alert, awake, non focal  Skin: He has a dry mixed hypopigmented with areas of dark rash on the palms of his hands, more so on his right hand than his left.  There is some skin cracking.        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  Stay on this dose for 3 weeks and then increase to 12.5 mg (5 pills) once weekly and stay at that dose Continue your folic acid as you have been taking it Continue prednisone 20 mg daily.  We will try to adjust this downward at your next visit Follow with Dr 11/07/19 in 6 to 8 weeks   , MD, PhD 11/05/2019, 10:21 AM [facility] [phone] or if no answer [phone]

## 2019-11-05 NOTE — Assessment & Plan Note (Signed)
Lab work today >> CBC, CMP Next week please increase your methotrexate to 10 mg (4 pills) once weekly.  Stay on this dose for 3 weeks and then increase to 12.5 mg (5 pills) once weekly and stay at that dose Continue your folic acid as you have been taking it Continue prednisone 20 mg daily.  We will try to adjust this downward at your next visit Follow with Dr Delton Coombes in 6 to 8 weeks

## 2019-12-09 MED ORDER — METHOTREXATE 2.5 MG PO TABS: 10.0000 mg | ORAL_TABLET | ORAL | 0 refills | Status: DC

## 2019-12-16 MED ORDER — PREDNISONE 20 MG PO TABS: 20.0000 mg | ORAL_TABLET | Freq: Every day | ORAL | 3 refills | Status: DC

## 2019-12-22 ENCOUNTER — Other Ambulatory Visit: Payer: Self-pay

## 2019-12-22 ENCOUNTER — Ambulatory Visit (INDEPENDENT_AMBULATORY_CARE_PROVIDER_SITE_OTHER): Payer: 59 | Admitting: Emergency Medicine

## 2019-12-22 ENCOUNTER — Encounter: Payer: Self-pay | Admitting: Emergency Medicine

## 2019-12-22 DIAGNOSIS — Z139 Encounter for screening, unspecified: Secondary | ICD-10-CM

## 2019-12-22 DIAGNOSIS — Z79631 Long term (current) use of antimetabolite agent: Secondary | ICD-10-CM

## 2019-12-22 DIAGNOSIS — R0789 Other chest pain: Secondary | ICD-10-CM | POA: Diagnosis not present

## 2019-12-22 DIAGNOSIS — Z79899 Other long term (current) drug therapy: Secondary | ICD-10-CM

## 2019-12-22 LAB — COMPREHENSIVE METABOLIC PANEL
ALT: 19 U/L (ref 0–53)
AST: 17 U/L (ref 0–37)
Albumin: 4 g/dL (ref 3.5–5.2)
Alkaline Phosphatase: 51 U/L (ref 39–117)
BUN: 9 mg/dL (ref 6–23)
CO2: 29 mEq/L (ref 19–32)
Calcium: 9.3 mg/dL (ref 8.4–10.5)
Chloride: 102 mEq/L (ref 96–112)
Creatinine, Ser: 1.06 mg/dL (ref 0.40–1.50)
GFR: 94.6 mL/min (ref >60.00)
Glucose, Bld: 95 mg/dL (ref 70–99)
Potassium: 3.6 mEq/L (ref 3.5–5.1)
Sodium: 141 mEq/L (ref 135–145)
Total Bilirubin: 0.5 mg/dL (ref 0.2–1.2)
Total Protein: 7.5 g/dL (ref 6.0–8.3)

## 2019-12-22 LAB — CBC WITH DIFFERENTIAL/PLATELET
Basophils Absolute: 0.1 10*3/uL (ref 0.0–0.1)
Basophils Relative: 0.6 % (ref 0.0–3.0)
Eosinophils Absolute: 0.1 10*3/uL (ref 0.0–0.7)
Eosinophils Relative: 1.2 % (ref 0.0–5.0)
HCT: 43.7 % (ref 39.0–52.0)
Hemoglobin: 14.4 g/dL (ref 13.0–17.0)
Lymphocytes Relative: 24.6 % (ref 12.0–46.0)
Lymphs Abs: 2.7 10*3/uL (ref 0.7–4.0)
MCHC: 33.1 g/dL (ref 30.0–36.0)
MCV: 93.7 fl (ref 78.0–100.0)
Monocytes Absolute: 0.7 10*3/uL (ref 0.1–1.0)
Monocytes Relative: 6.6 % (ref 3.0–12.0)
Neutro Abs: 7.4 10*3/uL (ref 1.4–7.7)
Neutrophils Relative %: 67 % (ref 43.0–77.0)
Platelets: 277 10*3/uL (ref 150.0–400.0)
RBC: 4.67 Mil/uL (ref 4.22–5.81)
RDW: 14.4 % (ref 11.5–15.5)
WBC: 11.1 10*3/uL — ABNORMAL HIGH (ref 4.0–10.5)

## 2019-12-22 NOTE — Addendum Note (Signed)
Addended by: Demetrio Lapping E on: 12/22/2019 11:26 AM   Modules accepted: Orders

## 2019-12-22 NOTE — Assessment & Plan Note (Signed)
He describes tightness, dyspnea. Resolves with rest. May very well be related to his sarcoid or his weight/deconditioning. Reasonable to retry SABA to see if he gets benefit. All the same I think he needs cardiology risk ratification since his weight puts him at high risk for coronary disease. He is agreeable. I will refer him to cardiology

## 2019-12-22 NOTE — Progress Notes (Signed)
Subjective:    Patient ID: Anthony [name], male    DOB: October 12, 1980, 39 y.o.   MRN: [id number]  HPI 39 year old man with a history of sarcoidosis, associated lymphadenopathy and pulmonary parenchymal disease, cutaneous disease especially on the palms of his hands.  Treated him for flaring symptoms at the beginning of 2021 with high-dose prednisone, weaned ultimately to 20 mg daily with a plan to transition to methotrexate.  The transition has not happened.  He has been concerned about being on the MTX as he and his wife are trying to have children. Good CPAP compliance  He is currently on prednisone 20mg  qd. Has slowly noticed some worsening in the rash on his palms, dyspnea. He is clearly better than he was before being on any Prednisone.   R OV 11/05/2019 --39 year old man follows up today for sarcoidosis with associated pulmonary disease, lymphadenopathy, cutaneous disease.  He also has a history of OSA on CPAP.  At his last visit in April we planned to initiate transition to methotrexate. He has been on 7.5mg  qweek for over a month.   ROV 12/22/19 --this is a follow-up visit for sarcoidosis.  39 year old gentleman with lymphadenopathy and cutaneous disease especially palmar aspect of his hands.  Also with OSA on CPAP.  I saw him at the end of June about 6 weeks ago and increased his methotrexate to 10 mg once weekly. He did this, hasn't increased to 12.5mg . he missed last week and just restarted.  I left his prednisone 20 mg daily.  Today he reports that he has been a bit more active. He has had some intermittent chest tightness and SOB, happened when he walked back to room today. Substernal, mid chest. Resolves with resting. Not associated with wheeze.    Review of Systems As per hpi     Objective:   Physical Exam Vitals:   12/22/19 1056 12/22/19 1100  BP:  134/90  Pulse: 94   Temp: (!) 97.4 F (36.3 C)   SpO2: 98%   Weight: (!) 585 lb (265.4 kg)   Height: 5\' 8"  (1.727 m)    Gen:  Pleasant, morbidly obese man, in no distress,  normal affect  ENT: No lesions,  mouth clear,  oropharynx clear, no postnasal drip  Neck: No JVD, no stridor  Lungs: No use of accessory muscles, no crackles or wheezing on normal respiration, no wheeze on forced expiration  Cardiovascular: RRR, heart sounds normal, no murmur or gallops, no peripheral edema  Musculoskeletal: No deformities, no cyanosis or clubbing  Neuro: alert, awake, non focal  Skin: He has a dry mixed hypopigmented with areas of dark rash on the palms of his hands, more so on his right hand than his left.  There is some skin cracking.      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  Lab work today. Continue prednisone 20mg  daily for now. We will work on decreasing this going forward.  We will try using albuterol 2 puffs if needed for chest tightness or shortness of breath. Keep track of whether it helps your symptoms.  Follow with Dr 02/21/20 in 1 month or next available.   Obstructive sleep apnea Continue BiPAP  Chest discomfort He describes tightness, dyspnea. Resolves with rest. May very well be related to his sarcoid or his weight/deconditioning. Reasonable to retry SABA to see if he gets benefit. All the same I  think he needs cardiology risk ratification since his weight puts him at high risk for coronary disease. He is agreeable. I will refer him to cardiology   Levy Pupa, MD, PhD 12/22/2019, 11:23 AM [facility] [phone] or if no answer [phone]

## 2019-12-22 NOTE — Assessment & Plan Note (Signed)
Continue BiPAP.  

## 2019-12-22 NOTE — Assessment & Plan Note (Signed)
We will plan to increase the methotrexate to 12.5mg  weekly next week.  Lab work today. Continue prednisone 20mg  daily for now. We will work on decreasing this going forward.  We will try using albuterol 2 puffs if needed for chest tightness or shortness of breath. Keep track of whether it helps your symptoms.  Follow with Dr in 1 month or next available.

## 2019-12-22 NOTE — Patient Instructions (Signed)
We will plan to increase the methotrexate to 12.5mg  weekly next week.  Lab work today. Continue prednisone 20mg  daily for now. We will work on decreasing this going forward.  We will try using albuterol 2 puffs if needed for chest tightness or shortness of breath. Keep track of whether it helps your symptoms.  We will refer you to see Cardiology to evaluate your risk for heart disease.  Continue your BiPAP every night Follow with Dr in 1 month or next available.

## 2019-12-24 MED ORDER — ALBUTEROL SULFATE HFA 108 (90 BASE) MCG/ACT IN AERS
2.0000 | INHALATION_SPRAY | Freq: Four times a day (QID) | RESPIRATORY_TRACT | 6 refills | Status: DC | PRN
Start: 2019-12-24 — End: 2022-02-19

## 2020-01-12 NOTE — Telephone Encounter (Signed)
Patient requesting refill for methotrexate 2.5 mg, last OV 12/22/2019, last refill 12/09/2019. Please refill or advise triage to do so. Thank you.

## 2020-01-13 MED ORDER — METHOTREXATE 2.5 MG PO TABS: 12.5000 mg | ORAL_TABLET | ORAL | 0 refills | Status: DC

## 2020-01-13 NOTE — Telephone Encounter (Signed)
Called and spoke with pt about Rx. I have sent Rx to pharmacy for pt and stated to pt to make sure he keeps follow up as scheduled. Pt verbalized understanding. Nothing further needed.

## 2020-01-13 NOTE — Telephone Encounter (Signed)
Pt calling about a refill on his medication. Pt can be reached at [phone].

## 2020-01-13 NOTE — Telephone Encounter (Signed)
Yes this needs to be refilled.  Need to insure that he has an OV so we can check his labwork on this dose.

## 2020-01-23 NOTE — Progress Notes (Deleted)
Cardiology Office Note:   Date:  01/23/2020  NAME:  Anthony [name]    MRN: [id number] DOB:  1980-11-03   PCP:  Patient, No Pcp Per  Cardiologist:  No primary care provider on file.  Electrophysiologist:  None   Referring MD: Anthony Peer, MD   No chief complaint on file. ***  History of Present Illness:   Anthony [name] is a 39 y.o. male with a hx of morbid obesity BMI 88, OSA, pulmonary sarcoidosis who is being seen today for the evaluation of chest pain/SOB at the request of Anthony [name], Anthony Pou, MD.  Problem list 1. Morbid obesity -wt 585 lb -BMI 88 2. OSA.  3. Pulmonary/cutaneous sarcoidosis   Past Medical History: Past Medical History:  Diagnosis Date  . Anxiety   . Depression   . Dyspnea   . Eczema of hand   . Headache    hx of with tooth pain  . Obese   . Pneumonia    12/2016  . Pre-diabetes   . Sarcoidosis   . Sleep apnea    cpap    Past Surgical History: Past Surgical History:  Procedure Laterality Date  . ARTHROSCOPY KNEE W/ DRILLING     right knee   . ENDOBRONCHIAL ULTRASOUND Bilateral 03/04/2017   Procedure: ENDOBRONCHIAL ULTRASOUND;  Surgeon: Anthony Peer, MD;  Location: [facility];  Service: Cardiopulmonary;  Laterality: Bilateral;  . left knee inner growth plate removed     to straighten leg    Current Medications: No outpatient medications have been marked as taking for the 01/25/20 encounter (Appointment) with O'Neal, Ronnald Ramp, MD.     Allergies:    Xarelto [rivaroxaban]   Social History: Social History   Socioeconomic History  . Marital status: Married    Spouse name: Not on file  . Number of children: Not on file  . Years of education: Not on file  . Highest education level: Not on file  Occupational History  . Occupation: Disabled  Tobacco Use  . Smoking status: Former Smoker    Packs/day: 0.30    Years: 10.00    Pack years: 3.00    Types: Cigarettes    Quit date: 09/30/2016    Years since quitting: 3.3  .  Smokeless tobacco: Never Used  Vaping Use  . Vaping Use: Never used  Substance and Sexual Activity  . Alcohol use: No  . Drug use: No  . Sexual activity: Yes    Birth control/protection: Condom  Other Topics Concern  . Not on file  Social History Narrative  . Not on file   Social Determinants of Health   Financial Resource Strain:   . Difficulty of Paying Living Expenses: Not on file  Food Insecurity:   . Worried About Programme researcher, broadcasting/film/video in the Last Year: Not on file  . Ran Out of Food in the Last Year: Not on file  Transportation Needs:   . Lack of Transportation (Medical): Not on file  . Lack of Transportation (Non-Medical): Not on file  Physical Activity:   . Days of Exercise per Week: Not on file  . Minutes of Exercise per Session: Not on file  Stress:   . Feeling of Stress : Not on file  Social Connections:   . Frequency of Communication with Friends and Family: Not on file  . Frequency of Social Gatherings with Friends and Family: Not on file  . Attends Religious Services: Not on file  . Active Member of  Clubs or Organizations: Not on file  . Attends Banker Meetings: Not on file  . Marital Status: Not on file     Family History: The patient's ***family history includes Diabetes in his father; Hypertension in his father; Lymphoma in his paternal uncle; Ovarian cancer (age of onset: 107) in his maternal grandmother; Stroke in his father.  ROS:   All other ROS reviewed and negative. Pertinent positives noted in the HPI.     EKGs/Labs/Other Studies Reviewed:   The following studies were personally reviewed by me today:  EKG:  EKG is *** ordered today.  The ekg ordered today demonstrates ***, and was personally reviewed by me.   Recent Labs: 12/22/2019: ALT 19; BUN 9; Creatinine, Ser 1.06; Hemoglobin 14.4; Platelets 277.0; Potassium 3.6; Sodium 141   Recent Lipid Panel    Component Value Date/Time   CHOL 146 06/24/2018 0839   TRIG 124.0 06/24/2018  0839   HDL 50.50 06/24/2018 0839   CHOLHDL 3 06/24/2018 0839   VLDL 24.8 06/24/2018 0839   LDLCALC 71 06/24/2018 0839    Physical Exam:   VS:  There were no vitals taken for this visit.   Wt Readings from Last 3 Encounters:  12/22/19 (!) 585 lb (265.4 kg)  11/05/19 (!) 584 lb 3.2 oz (265 kg)  08/20/19 (!) 567 lb (257.2 kg)    General: Well nourished, well developed, in no acute distress Heart: Atraumatic, normal size  Eyes: PEERLA, EOMI  Neck: Supple, no JVD Endocrine: No thryomegaly Cardiac: Normal S1, S2; RRR; no murmurs, rubs, or gallops Lungs: Clear to auscultation bilaterally, no wheezing, rhonchi or rales  Abd: Soft, nontender, no hepatomegaly  Ext: No edema, pulses 2+ Musculoskeletal: No deformities, BUE and BLE strength normal and equal Skin: Warm and dry, no rashes   Neuro: Alert and oriented to person, place, time, and situation, CNII-XII grossly intact, no focal deficits  Psych: Normal mood and affect   ASSESSMENT:   Anthony [name] is a 39 y.o. male who presents for the following: No diagnosis found.  PLAN:   There are no diagnoses linked to this encounter.  Disposition: No follow-ups on file.  Medication Adjustments/Labs and Tests Ordered: Current medicines are reviewed at length with the patient today.  Concerns regarding medicines are outlined above.  No orders of the defined types were placed in this encounter.  No orders of the defined types were placed in this encounter.   There are no Patient Instructions on file for this visit.   Time Spent with Patient: I have spent a total of *** minutes with patient reviewing hospital notes, telemetry, EKGs, labs and examining the patient as well as establishing an assessment and plan that was discussed with the patient.  > 50% of time was spent in direct patient care.  Signed, Lenna Gilford. Flora Lipps, MD [facility]  [address] [place], [place] [address] [phone]  01/23/2020  6:55 PM

## 2020-01-25 ENCOUNTER — Ambulatory Visit: Payer: 59 | Admitting: Cardiovascular Disease

## 2020-02-03 ENCOUNTER — Ambulatory Visit (INDEPENDENT_AMBULATORY_CARE_PROVIDER_SITE_OTHER): Payer: 59 | Admitting: Emergency Medicine

## 2020-02-03 ENCOUNTER — Other Ambulatory Visit: Payer: Self-pay

## 2020-02-03 ENCOUNTER — Encounter: Payer: Self-pay | Admitting: Emergency Medicine

## 2020-02-03 VITALS — BP 132/74 | HR 77 | Temp 97.3°F | Ht 68.0 in | Wt >= 6400 oz

## 2020-02-03 DIAGNOSIS — Z5181 Encounter for therapeutic drug level monitoring: Secondary | ICD-10-CM | POA: Diagnosis not present

## 2020-02-03 DIAGNOSIS — D862 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: Secondary | ICD-10-CM | POA: Diagnosis not present

## 2020-02-03 LAB — CBC WITH DIFFERENTIAL/PLATELET
Basophils Absolute: 0.1 10*3/uL (ref 0.0–0.1)
Basophils Relative: 0.5 % (ref 0.0–3.0)
Eosinophils Absolute: 0 10*3/uL (ref 0.0–0.7)
Eosinophils Relative: 0.5 % (ref 0.0–5.0)
HCT: 44.5 % (ref 39.0–52.0)
Hemoglobin: 14.7 g/dL (ref 13.0–17.0)
Lymphocytes Relative: 16.1 % (ref 12.0–46.0)
Lymphs Abs: 1.6 10*3/uL (ref 0.7–4.0)
MCHC: 33 g/dL (ref 30.0–36.0)
MCV: 94.6 fl (ref 78.0–100.0)
Monocytes Absolute: 0.6 10*3/uL (ref 0.1–1.0)
Monocytes Relative: 6 % (ref 3.0–12.0)
Neutro Abs: 7.6 10*3/uL (ref 1.4–7.7)
Neutrophils Relative %: 76.9 % (ref 43.0–77.0)
Platelets: 267 10*3/uL (ref 150.0–400.0)
RBC: 4.7 Mil/uL (ref 4.22–5.81)
RDW: 14.2 % (ref 11.5–15.5)
WBC: 9.9 10*3/uL (ref 4.0–10.5)

## 2020-02-03 LAB — COMPREHENSIVE METABOLIC PANEL
ALT: 20 U/L (ref 0–53)
AST: 17 U/L (ref 0–37)
Albumin: 4.2 g/dL (ref 3.5–5.2)
Alkaline Phosphatase: 51 U/L (ref 39–117)
BUN: 10 mg/dL (ref 6–23)
CO2: 29 mEq/L (ref 19–32)
Calcium: 9.3 mg/dL (ref 8.4–10.5)
Chloride: 104 mEq/L (ref 96–112)
Creatinine, Ser: 0.97 mg/dL (ref 0.40–1.50)
GFR: 104.73 mL/min (ref >60.00)
Glucose, Bld: 102 mg/dL — ABNORMAL HIGH (ref 70–99)
Potassium: 4.1 mEq/L (ref 3.5–5.1)
Sodium: 140 mEq/L (ref 135–145)
Total Bilirubin: 0.4 mg/dL (ref 0.2–1.2)
Total Protein: 7.5 g/dL (ref 6.0–8.3)

## 2020-02-03 MED ORDER — PREDNISONE 10 MG PO TABS
10.0000 mg | ORAL_TABLET | Freq: Every day | ORAL | 0 refills | Status: DC
Start: 2020-02-03 — End: 2020-03-03

## 2020-02-03 NOTE — Progress Notes (Signed)
Subjective:    Patient ID: Anthony [name], male    DOB: 04-16-1981, 39 y.o.   MRN: [id number]  HPI 39 year old man with a history of sarcoidosis, associated lymphadenopathy and pulmonary parenchymal disease, cutaneous disease especially on the palms of his hands.  Treated him for flaring symptoms at the beginning of 2021 with high-dose prednisone, weaned ultimately to 20 mg daily with a plan to transition to methotrexate.  The transition has not happened.  He has been concerned about being on the MTX as he and his wife are trying to have children. Good CPAP compliance  He is currently on prednisone 20mg  qd. Has slowly noticed some worsening in the rash on his palms, dyspnea. He is clearly better than he was before being on any Prednisone.   R OV 11/05/2019 --39 year old man follows up today for sarcoidosis with associated pulmonary disease, lymphadenopathy, cutaneous disease.  He also has a history of OSA on CPAP.  At his last visit in April we planned to initiate transition to methotrexate. He has been on 7.5mg  qweek for over a month.   ROV 12/22/19 --this is a follow-up visit for sarcoidosis.  39 year old gentleman with lymphadenopathy and cutaneous disease especially palmar aspect of his hands.  Also with OSA on CPAP.  I saw him at the end of June about 6 weeks ago and increased his methotrexate to 10 mg once weekly. He did this, hasn't increased to 12.5mg . he missed last week and just restarted.  I left his prednisone 20 mg daily.  Today he reports that he has been a bit more active. He has had some intermittent chest tightness and SOB, happened when he walked back to room today. Substernal, mid chest. Resolves with resting. Not associated with wheeze.   ROV 02/03/20 --39 year old man with sarcoidosis, significant cutaneous disease especially on the palms of his hands, lymphadenopathy.  Also with obesity, OSA/OHS on CPAP.  At his last visit we planned to increase his methotrexate to 12.5 mg weekly.   He was having some chest discomfort and tightness and we decided to try albuterol to see if he would get benefit - not sure that it has made a difference in his breathing. He missed his Cards referral, is going to reschedule. He remains on prednisone 20 mg daily with a goal to begin weaning soon. Good compliance w CPAP.    Review of Systems As per hpi     Objective:   Physical Exam Vitals:   02/03/20 1153  BP: 132/74  Pulse: 77  Temp: (!) 97.3 F (36.3 C)  TempSrc: Temporal  SpO2: 97%  Weight: (!) 588 lb 3.2 oz (266.8 kg)  Height: 5\' 8"  (1.727 m)   Gen: Pleasant, morbidly obese man, in no distress,  normal affect  ENT: No lesions,  mouth clear,  oropharynx clear, no postnasal drip  Neck: No JVD, no stridor  Lungs: No use of accessory muscles, no crackles or wheezing on normal respiration, no wheeze on forced expiration  Cardiovascular: RRR, heart sounds normal, no murmur or gallops, no peripheral edema  Musculoskeletal: No deformities, no cyanosis or clubbing  Neuro: alert, awake, non focal  Skin: He has a dry mixed hypopigmented with areas of dark rash on the palms of his hands, more so on his right hand than his left.  There is some skin cracking.      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  Overall clinically stable with regard to breathing and also his palmar rash.  I believe we can try to start weaning his prednisone slowly, he agrees.  Discussed his functional capacity and ability to work today.  He is quite limited by his sarcoidosis, by his joint pain, by his obesity.  He has been on disability and I do not believe his medical conditions are going to allow him to return to work.  He is going to work on permanent disability.  I will support this claim and provide his medical records when necessary.  Continue methotrexate 12.5 mg weekly. Lab work today Continue  your folic acid every day as you have been taking it We will decrease prednisone to 15 mg daily for 3 weeks.  If you continue to do well at that time go ahead and decrease to 10 mg daily.  Stay at that dose until next visit Okay to continue to use albuterol 2 puffs if you need it for shortness of breath, chest tightness, wheezing. Reschedule your cardiology evaluation Follow with Dr Delton Coombes in 6-8 weeks or sooner if you have any problems   Levy Pupa, MD, PhD 02/09/2020, 5:22 PM [facility] [phone] or if no answer [phone]

## 2020-02-03 NOTE — Patient Instructions (Signed)
Continue methotrexate 12.5 mg weekly. Lab work today Continue your folic acid every day as you have been taking it We will decrease prednisone to 15 mg daily for 3 weeks.  If you continue to do well at that time go ahead and decrease to 10 mg daily.  Stay at that dose until next visit Okay to continue to use albuterol 2 puffs if you need it for shortness of breath, chest tightness, wheezing. Reschedule your cardiology evaluation Follow with Dr Delton Coombes in 6-8 weeks or sooner if you have any problems

## 2020-02-07 ENCOUNTER — Telehealth: Payer: Self-pay | Admitting: Emergency Medicine

## 2020-02-07 NOTE — Telephone Encounter (Signed)
Dr. Delton Coombes patient is sending another email regarding an notation in his chart regarding this disability.   "Yes, I have applied for disability. I'm at least 4 years in concerning my case. I discussed it with Dr. Delton Coombes at my last appointment a few days ago. He said he had know problem notating my chart from my last visit concerning my condition being disabling. As stated in my message on 9/24."  Dr. Herbie Baltimore please advise if this has been done

## 2020-02-08 ENCOUNTER — Encounter: Payer: Self-pay | Admitting: Emergency Medicine

## 2020-02-09 NOTE — Telephone Encounter (Signed)
Anthony Peer, MD  02/04/2020 10:36 AM EDT     Labs are normal. Continue same meds. RSB   Spoke with the pt and notified of recs per Dr Delton Coombes  Pt verbalized understanding  Nothing further needed

## 2020-02-09 NOTE — Assessment & Plan Note (Signed)
He is tolerating the increase in methotrexate now 12.5 mg weekly.  Overall clinically stable with regard to breathing and also his palmar rash.  I believe we can try to start weaning his prednisone slowly, he agrees.  Discussed his functional capacity and ability to work today.  He is quite limited by his sarcoidosis, by his joint pain, by his obesity.  He has been on disability and I do not believe his medical conditions are going to allow him to return to work.  He is going to work on permanent disability.  I will support this claim and provide his medical records when necessary.  Continue methotrexate 12.5 mg weekly. Lab work today Continue your folic acid every day as you have been taking it We will decrease prednisone to 15 mg daily for 3 weeks.  If you continue to do well at that time go ahead and decrease to 10 mg daily.  Stay at that dose until next visit Okay to continue to use albuterol 2 puffs if you need it for shortness of breath, chest tightness, wheezing. Reschedule your cardiology evaluation Follow with Dr Delton Coombes in 6-8 weeks or sooner if you have any problems

## 2020-02-14 ENCOUNTER — Other Ambulatory Visit: Payer: Self-pay | Admitting: Emergency Medicine

## 2020-02-28 NOTE — Progress Notes (Signed)
Cardiology Office Note:   Date:  02/29/2020  NAME:  Anthony [name]    MRN: [id number] DOB:  05/09/1981   PCP:  Patient, No Pcp Per  Cardiologist:  No primary care provider on file.  Electrophysiologist:  None   Referring MD: Leslye Peer, MD   Chief Complaint  Patient presents with  . Chest Pain   History of Present Illness:   Anthony [name] is a 39 y.o. male with a hx of morbid obesity (BMI 89), pulmonary sarcoidosis, OSA who is being seen today for the evaluation of chest pain at the request of Leslye Peer, MD. Echo in 2018 showed probably normal EF, but due to habitus very poor study. He reports he gets short of breath when sitting. He does not do much activity due to his obesity. He also has several arthritic issues. He has bad knees per his report. He also has chronic back pain. He also reports he gets occasional tightness in his chest. Describes sharp pain in the center of his chest. Can catch his breath. Does not occur all the time. He reports when he does activity is not that short of breath but when he is sitting he appears to have this. He does suffer from morbid obesity. He has obstructive sleep apnea and requires BiPAP with oxygen at night. Symptoms appear to be stable. He had an echocardiogram in the past which is unable to really see the heart due to his obesity. He does have sarcoidosis but there is been no evidence of heart involvement. His EKG today demonstrates normal sinus rhythm. He does have possibly an inferior infarct on that which is likely related to RV enlargement from what I suspect. He reports he does not have any worsening lower extremity edema today. He can get some at times. He is working on his diet and losing weight. He is a former smoker. He does suffer from pulmonary sarcoidosis. No prior heart attack or stroke. No strong family history of heart disease. He reports he would like to see a healthy weight and wellness at some point. His consider bariatric  surgery in the past but apparently insurance has been an issue.  Problem List 1. Pulmonary sarcoidosis 2. Morbid obesity -BMI 89 3. OSA   Past Medical History: Past Medical History:  Diagnosis Date  . Anxiety   . Depression   . Dyspnea   . Eczema of hand   . Headache    hx of with tooth pain  . Obese   . Pneumonia    12/2016  . Pre-diabetes   . Sarcoidosis   . Sleep apnea    cpap    Past Surgical History: Past Surgical History:  Procedure Laterality Date  . ARTHROSCOPY KNEE W/ DRILLING     right knee   . ENDOBRONCHIAL ULTRASOUND Bilateral 03/04/2017   Procedure: ENDOBRONCHIAL ULTRASOUND;  Surgeon: Leslye Peer, MD;  Location: [facility];  Service: Cardiopulmonary;  Laterality: Bilateral;  . left knee inner growth plate removed     to straighten leg    Current Medications: Current Meds  Medication Sig  . albuterol (VENTOLIN HFA) 108 (90 Base) MCG/ACT inhaler Inhale 2 puffs into the lungs every 6 (six) hours as needed for wheezing or shortness of breath.  . folic acid (FOLVITE) 1 MG tablet Take 1 tablet (1 mg total) by mouth daily.  Marland Kitchen ibuprofen (ADVIL,MOTRIN) 400 MG tablet Take 800 mg by mouth every 6 (six) hours as needed.   . Menthol,  Topical Analgesic, (ICY HOT EX) Apply 1 application topically daily as needed (muscle pain).  . methotrexate (RHEUMATREX) 2.5 MG tablet TAKE 5 TABLETS BY MOUTH ONCE A WEEK. CAUTION: CHEMOTHERAPY. PROTECT FROM LIGHT  . predniSONE (DELTASONE) 10 MG tablet Take 1 tablet (10 mg total) by mouth daily with breakfast.     Allergies:    Xarelto [rivaroxaban]   Social History: Social History   Socioeconomic History  . Marital status: Married    Spouse name: Not on file  . Number of children: Not on file  . Years of education: Not on file  . Highest education level: Not on file  Occupational History  . Occupation: Disabled  Tobacco Use  . Smoking status: Former Smoker    Packs/day: 0.30    Years: 10.00    Pack years: 3.00     Types: Cigarettes    Quit date: 09/30/2016    Years since quitting: 3.4  . Smokeless tobacco: Never Used  Vaping Use  . Vaping Use: Never used  Substance and Sexual Activity  . Alcohol use: No  . Drug use: No  . Sexual activity: Yes    Birth control/protection: Condom  Other Topics Concern  . Not on file  Social History Narrative  . Not on file   Social Determinants of Health   Financial Resource Strain:   . Difficulty of Paying Living Expenses: Not on file  Food Insecurity:   . Worried About Programme researcher, broadcasting/film/video in the Last Year: Not on file  . Ran Out of Food in the Last Year: Not on file  Transportation Needs:   . Lack of Transportation (Medical): Not on file  . Lack of Transportation (Non-Medical): Not on file  Physical Activity:   . Days of Exercise per Week: Not on file  . Minutes of Exercise per Session: Not on file  Stress:   . Feeling of Stress : Not on file  Social Connections:   . Frequency of Communication with Friends and Family: Not on file  . Frequency of Social Gatherings with Friends and Family: Not on file  . Attends Religious Services: Not on file  . Active Member of Clubs or Organizations: Not on file  . Attends Banker Meetings: Not on file  . Marital Status: Not on file     Family History: The patient's family history includes Diabetes in his father; Hypertension in his father; Lymphoma in his paternal uncle; Ovarian cancer (age of onset: 18) in his maternal grandmother; Stroke in his father.  ROS:   All other ROS reviewed and negative. Pertinent positives noted in the HPI.     EKGs/Labs/Other Studies Reviewed:   The following studies were personally reviewed by me today:  EKG:  EKG is ordered today.  The ekg ordered today demonstrates normal sinus rhythm, heart rate 73, possible inferior infarct noted, and was personally reviewed by me.   Recent Labs: 02/03/2020: ALT 20; BUN 10; Creatinine, Ser 0.97; Hemoglobin 14.7; Platelets  267.0; Potassium 4.1; Sodium 140   Recent Lipid Panel    Component Value Date/Time   CHOL 146 06/24/2018 0839   TRIG 124.0 06/24/2018 0839   HDL 50.50 06/24/2018 0839   CHOLHDL 3 06/24/2018 0839   VLDL 24.8 06/24/2018 0839   LDLCALC 71 06/24/2018 0839    Physical Exam:   VS:  BP 114/68   Pulse 73   Ht 5\' 8"  (1.727 m)   Wt (!) 584 lb 9.6 oz (265.2 kg)   SpO2  99%   BMI 88.89 kg/m    Wt Readings from Last 3 Encounters:  02/29/20 (!) 584 lb 9.6 oz (265.2 kg)  02/03/20 (!) 588 lb 3.2 oz (266.8 kg)  12/22/19 (!) 585 lb (265.4 kg)    General: Morbidly obese male, no acute distress Heart: Atraumatic, normal size  Eyes: PEERLA, EOMI  Neck: Supple, no JVD Endocrine: No thryomegaly Cardiac: Normal S1, S2; RRR; no murmurs, rubs, or gallops Lungs: Clear to auscultation bilaterally, no wheezing, rhonchi or rales  Abd: Soft, nontender, no hepatomegaly  Ext: No edema, pulses 2+ Musculoskeletal: No deformities, BUE and BLE strength normal and equal Skin: Warm and dry, no rashes   Neuro: Alert and oriented to person, place, time, and situation, CNII-XII grossly intact, no focal deficits  Psych: Normal mood and affect   ASSESSMENT:   Anthony [name] is a 38 y.o. male who presents for the following: 1. Chest pain, unspecified type   2. SOB (shortness of breath) on exertion   3. Obesity, morbid, BMI 50 or higher (HCC)     PLAN:   1. Chest pain, unspecified type 2. SOB (shortness of breath) on exertion 3. Obesity, morbid, BMI 50 or higher (HCC) -Occasional chest tightness and shortness of breath which mainly occur at rest. No strong exertional symptoms other than easy fatigue. EKG shows questionable inferior infarct.  -I think his symptoms are all obesity related. He could have acid reflux explain his chest tightness. His BMI is 89 and his weight is 584 pounds. I have recommended to check a BNP today. We will attempt to repeat an echocardiogram. In the past he has had such poor  acoustic windows due to his morbid obesity the heart was not able to be evaluated.  -He is not a candidate for cardiac stress test given his size. More importantly he is not a candidate for cardiac catheterization should any of his tests be abnormal. I think the best course of action for him to pursue is to lose weight. I have recommended urgent referral to the healthy weight and wellness clinic. He needs to be evaluated and started on a diet protocol so he can lose weight. His weight his life expectancy is not what it should be at his young age. We will notify him of his results by phone. -Overall his cardiac risk is quite high given his morbid obesity. However, we have very limited options to evaluate him from a cardiac standpoint. -He will see [place] as needed.  Disposition: Return if symptoms worsen or fail to improve.  Medication Adjustments/Labs and Tests Ordered: Current medicines are reviewed at length with the patient today.  Concerns regarding medicines are outlined above.  Orders Placed This Encounter  Procedures  . Brain natriuretic peptide  . Amb Ref to Medical Weight Management  . EKG 12-Lead  . ECHOCARDIOGRAM COMPLETE   No orders of the defined types were placed in this encounter.   Patient Instructions  Medication Instructions:  Your physician recommends that you continue on your current medications as directed. Please refer to the Current Medication list given to you today.  *If you need a refill on your cardiac medications before your next appointment, please call your pharmacy*   Lab Work: BNP today   If you have labs (blood work) drawn today and your tests are completely normal, you will receive your results only by: Marland Kitchen MyChart Message (if you have MyChart) OR . A paper copy in the mail If you have any lab test that  is abnormal or we need to change your treatment, we will call you to review the results.   Testing/Procedures: Your physician has requested that you  have an echocardiogram. Echocardiography is a painless test that uses sound waves to create images of your heart. It provides your doctor with information about the size and shape of your heart and how well your heart's chambers and valves are working. This procedure takes approximately one hour. There are no restrictions for this procedure. -- [address]rd [address]   Follow-Up: At [facility], you and your health needs are our priority.  As part of our continuing mission to provide you with exceptional heart care, we have created designated Provider Care Teams.  These Care Teams include your primary Cardiologist (physician) and Advanced Practice Providers (APPs -  Physician Assistants and Nurse Practitioners) who all work together to provide you with the care you need, when you need it.  We recommend signing up for the patient portal called "MyChart".  Sign up information is provided on this After Visit Summary.  MyChart is used to connect with patients for Virtual Visits (Telemedicine).  Patients are able to view lab/test results, encounter notes, upcoming appointments, etc.  Non-urgent messages can be sent to your provider as well.   To learn more about what you can do with MyChart, go to [email or web address].    Your next appointment:   AS NEEDED with Dr. Flora Lipps  Other Instructions You have been referred to Dr. Quillian [facility] [address] [place]. [place], [place] [address] [phone]   Signed, Lenna Gilford. Flora Lipps, MD [facility]  [address], [address] [place], [place] [address] [phone]  02/29/2020 3:44 PM

## 2020-02-29 ENCOUNTER — Encounter: Payer: Self-pay | Admitting: Cardiovascular Disease

## 2020-02-29 ENCOUNTER — Ambulatory Visit (INDEPENDENT_AMBULATORY_CARE_PROVIDER_SITE_OTHER): Payer: 59 | Admitting: Cardiovascular Disease

## 2020-02-29 ENCOUNTER — Other Ambulatory Visit: Payer: Self-pay

## 2020-02-29 VITALS — BP 114/68 | HR 73 | Ht 68.0 in | Wt >= 6400 oz

## 2020-02-29 DIAGNOSIS — R079 Chest pain, unspecified: Secondary | ICD-10-CM | POA: Diagnosis not present

## 2020-02-29 DIAGNOSIS — R0602 Shortness of breath: Secondary | ICD-10-CM | POA: Diagnosis not present

## 2020-02-29 NOTE — Patient Instructions (Signed)
Medication Instructions:  Your physician recommends that you continue on your current medications as directed. Please refer to the Current Medication list given to you today.  *If you need a refill on your cardiac medications before your next appointment, please call your pharmacy*   Lab Work: BNP today   If you have labs (blood work) drawn today and your tests are completely normal, you will receive your results only by: Marland Kitchen MyChart Message (if you have MyChart) OR . A paper copy in the mail If you have any lab test that is abnormal or we need to change your treatment, we will call you to review the results.   Testing/Procedures: Your physician has requested that you have an echocardiogram. Echocardiography is a painless test that uses sound waves to create images of your heart. It provides your doctor with information about the size and shape of your heart and how well your heart's chambers and valves are working. This procedure takes approximately one hour. There are no restrictions for this procedure. -- [address] 3[address]   Follow-Up: At [facility], you and your health needs are our priority.  As part of our continuing mission to provide you with exceptional heart care, we have created designated Provider Care Teams.  These Care Teams include your primary Cardiologist (physician) and Advanced Practice Providers (APPs -  Physician Assistants and Nurse Practitioners) who all work together to provide you with the care you need, when you need it.  We recommend signing up for the patient portal called "MyChart".  Sign up information is provided on this After Visit Summary.  MyChart is used to connect with patients for Virtual Visits (Telemedicine).  Patients are able to view lab/test results, encounter notes, upcoming appointments, etc.  Non-urgent messages can be sent to your provider as well.   To learn more about what you can do with MyChart, go to [email or web address].     Your next appointment:   AS NEEDED with Dr. Flora Lipps  Other Instructions You have been referred to Dr. Quillian [facility] [address] [place]. [place], [place] [address] [phone]

## 2020-03-01 LAB — BRAIN NATRIURETIC PEPTIDE: BNP: 11.1 pg/mL (ref 0.0–100.0)

## 2020-03-03 MED ORDER — FOLIC ACID 1 MG PO TABS
1.0000 mg | ORAL_TABLET | Freq: Every day | ORAL | 5 refills | Status: DC
Start: 2020-03-03 — End: 2021-01-11

## 2020-03-03 MED ORDER — PREDNISONE 10 MG PO TABS
10.0000 mg | ORAL_TABLET | Freq: Every day | ORAL | 0 refills | Status: DC
Start: 2020-03-03 — End: 2020-04-03

## 2020-03-07 NOTE — Telephone Encounter (Signed)
Dr. Delton Coombes please advise on patient mychart message. He has been made aware that you are out of the office this week.  Since my Prednisone dosage has been reduced to 10mg , I have been experiencing worsening pain in my knees. Is there anything that I can be prescribed to help with this?

## 2020-03-13 NOTE — Telephone Encounter (Signed)
I know he is already taking ibuprofen sometimes.  Could alternate with tylenol. I would be willing to give him ultram 100mg  q6h prn for pain if he thinks it will help (I think he has used before) .

## 2020-03-16 ENCOUNTER — Ambulatory Visit: Payer: 59 | Admitting: Emergency Medicine

## 2020-03-16 ENCOUNTER — Ambulatory Visit (INDEPENDENT_AMBULATORY_CARE_PROVIDER_SITE_OTHER): Payer: Self-pay | Admitting: Family Medicine

## 2020-03-21 ENCOUNTER — Other Ambulatory Visit: Payer: Self-pay

## 2020-03-21 ENCOUNTER — Ambulatory Visit (HOSPITAL_COMMUNITY): Payer: 59 | Attending: Cardiology

## 2020-03-21 DIAGNOSIS — R0602 Shortness of breath: Secondary | ICD-10-CM

## 2020-03-21 LAB — ECHOCARDIOGRAM COMPLETE
Area-P 1/2: 3.48 cm2
S' Lateral: 3.5 cm

## 2020-03-21 MED ORDER — PERFLUTREN LIPID MICROSPHERE
1.0000 mL | INTRAVENOUS | Status: AC | PRN
  Administered 2020-03-21: 2 mL via INTRAVENOUS

## 2020-03-21 MED ORDER — METHOTREXATE 2.5 MG PO TABS
12.5000 mg | ORAL_TABLET | ORAL | 0 refills | Status: DC
Start: 2020-03-21 — End: 2020-05-01

## 2020-03-30 ENCOUNTER — Ambulatory Visit (INDEPENDENT_AMBULATORY_CARE_PROVIDER_SITE_OTHER): Payer: Self-pay | Admitting: Family Medicine

## 2020-04-03 MED ORDER — PREDNISONE 10 MG PO TABS
10.0000 mg | ORAL_TABLET | Freq: Every day | ORAL | 0 refills | Status: DC
Start: 2020-04-03 — End: 2020-05-01

## 2020-04-05 ENCOUNTER — Other Ambulatory Visit: Payer: Self-pay

## 2020-04-05 ENCOUNTER — Encounter: Payer: Self-pay | Admitting: Emergency Medicine

## 2020-04-05 ENCOUNTER — Ambulatory Visit (INDEPENDENT_AMBULATORY_CARE_PROVIDER_SITE_OTHER): Payer: 59 | Admitting: Emergency Medicine

## 2020-04-05 DIAGNOSIS — G4733 Obstructive sleep apnea (adult) (pediatric): Secondary | ICD-10-CM

## 2020-04-05 DIAGNOSIS — D862 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: Secondary | ICD-10-CM | POA: Diagnosis not present

## 2020-04-05 MED ORDER — TRIAMCINOLONE ACETONIDE 0.1 % EX CREA: 1.0000 "application " | TOPICAL_CREAM | Freq: Three times a day (TID) | CUTANEOUS | 0 refills | Status: DC

## 2020-04-05 NOTE — Assessment & Plan Note (Addendum)
As well as significant cutaneous disease especially in the palms of his hands.  He notes some decreased functional capacity, more shortness of breath as we have slowly come down on his prednisone.  Also more inflammatory change in his skin, hands.  Not clear to me that were going to be able to continue to wean the prednisone.  He is now on 10 mg daily since the addition of his methotrexate.  I will try triamcinolone cream to his hands which may allow [place] to continue to come down on the prednisone.  I will also check pulmonary function testing next time to assess for evolving obstructive lung disease.  His PFT from 2019 were fairly reassuring. most recent CT chest with 2019, may need to repeat and assess for evolving sarcoid changes.  Continue the methotrexate, folate at same dose for now.  Lab work today.

## 2020-04-05 NOTE — Assessment & Plan Note (Signed)
With OHS.  On BiPAP.  His machine is old, over 38 years old.  He gets his equipment from [place].  We will arrange for a repeat BiPAP titration study to ensure that his pressures are adequate, optimized.  Once this information is available I will order a new machine through [facility] for him.

## 2020-04-05 NOTE — Addendum Note (Signed)
Addended by: Dorisann Frames R on: 04/05/2020 12:55 PM   Modules accepted: Orders

## 2020-04-05 NOTE — Addendum Note (Signed)
Addended by: Dorisann Frames R on: 04/05/2020 12:53 PM   Modules accepted: Orders

## 2020-04-05 NOTE — Progress Notes (Signed)
Subjective:    Patient ID: Anthony [name], male    DOB: Apr 03, 1981, 39 y.o.   MRN: [id number]  HPI  ROV 02/03/20 --39 year old man with sarcoidosis, significant cutaneous disease especially on the palms of his hands, lymphadenopathy.  Also with obesity, OSA/OHS on CPAP.  At his last visit we planned to increase his methotrexate to 12.5 mg weekly.  He was having some chest discomfort and tightness and we decided to try albuterol to see if he would get benefit - not sure that it has made a difference in his breathing. He missed his Cards referral, is going to reschedule. He remains on prednisone 20 mg daily with a goal to begin weaning soon. Good compliance w CPAP.   ROV 04/05/20 --pleasant 39 year old gentleman history of obesity, sarcoidosis with both cutaneous and lymphatic involvement.  He has OSA/OHS, on CPAP.  We have been managing him on 12.5 mg weekly methotrexate.  Current prednisone dose is 10 mg daily (decreased last time in September).  His CBC, LFT have been reassuring, stable, last done in September.  He is working on obtaining permanent disability. He saw the cardiologist on 02/29/2020, reassuring TTE and ECG.    Review of Systems As per hpi     Objective:   Physical Exam Vitals:   04/05/20 1212  BP: (!) 146/84  Pulse: 72  Temp: 97.7 F (36.5 C)  SpO2: 98%  Weight: (!) 588 lb 6.4 oz (266.9 kg)  Height: 5\' 8"  (1.727 m)   Gen: Pleasant, morbidly obese man, in no distress,  normal affect  ENT: No lesions,  mouth clear,  oropharynx clear, no postnasal drip  Neck: No JVD, no stridor  Lungs: No use of accessory muscles, no crackles or wheezing on normal respiration, no wheeze on forced expiration  Cardiovascular: RRR, heart sounds normal, no murmur or gallops, no peripheral edema  Musculoskeletal: No deformities, no cyanosis or clubbing  Neuro: alert, awake, non focal  Skin: He has a dry mixed hypopigmented with areas of dark rash on the palms of his hands, more so  on his right hand than his left.  There is some skin cracking, appears stable compared with last exam.      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  He notes some decreased functional capacity, more shortness of breath as we have slowly come down on his prednisone.  Also more inflammatory change in his skin, hands.  Not clear to me that were going to be able to continue to wean the prednisone.  He is now on 10 mg daily since the addition of his methotrexate.  I will try triamcinolone cream to his hands which may allow to continue to come down on the prednisone.  I will also check pulmonary function testing next time to assess for evolving obstructive lung disease.  His PFT from 2019 were fairly reassuring. most recent CT chest with 2019, may need to repeat and assess for evolving sarcoid changes.  Continue the methotrexate, folate at same dose for now.  Lab work today.  Obstructive sleep apnea With OHS.  On BiPAP.  His machine is old, over 66 years old.  He gets his equipment from [facility].  We will arrange for a repeat BiPAP titration study to ensure that his pressures are adequate, optimized.  Once this information is available I will order a new machine through [place] for  him.   Levy Pupa, MD, PhD 04/05/2020, 12:45 PM [facility] [phone] or if no answer [phone]

## 2020-04-05 NOTE — Patient Instructions (Addendum)
Please try starting triamcinolone 0.1% lotion to affected skin of your hands.  Use 2-3 times daily. We will set up lab work today Please keep albuterol available use 2 puffs if needed for shortness of breath, chest tightness, wheezing. We will arrange for repeat pulmonary function testing in next office visit Continue prednisone 10 mg daily Continue your methotrexate 12.5 mg weekly. Continue your folate every day We will order a BiPAP titration study in the sleep lab.  Based on this we will adjust your BiPAP pressures and order you a new machine when the information is available.  Continue current BiPAP for now. Follow with Dr. Delton Coombes next available with full pulmonary function testing on the same day.

## 2020-05-01 MED ORDER — METHOTREXATE 2.5 MG PO TABS
12.5000 mg | ORAL_TABLET | ORAL | 0 refills | Status: DC
Start: 2020-05-01 — End: 2021-01-11

## 2020-05-01 MED ORDER — PREDNISONE 10 MG PO TABS
10.0000 mg | ORAL_TABLET | Freq: Every day | ORAL | 0 refills | Status: DC
Start: 2020-05-01 — End: 2020-06-12

## 2020-05-18 ENCOUNTER — Ambulatory Visit: Payer: 59 | Admitting: Internal Medicine

## 2020-05-19 ENCOUNTER — Ambulatory Visit: Payer: 59 | Admitting: Emergency Medicine

## 2020-05-24 ENCOUNTER — Other Ambulatory Visit: Payer: Self-pay

## 2020-05-24 ENCOUNTER — Ambulatory Visit (HOSPITAL_BASED_OUTPATIENT_CLINIC_OR_DEPARTMENT_OTHER): Payer: 59 | Attending: Emergency Medicine | Admitting: Pulmonary Disease

## 2020-05-24 DIAGNOSIS — G4733 Obstructive sleep apnea (adult) (pediatric): Secondary | ICD-10-CM | POA: Diagnosis present

## 2020-05-25 ENCOUNTER — Other Ambulatory Visit (HOSPITAL_BASED_OUTPATIENT_CLINIC_OR_DEPARTMENT_OTHER): Payer: Self-pay

## 2020-05-25 DIAGNOSIS — G4733 Obstructive sleep apnea (adult) (pediatric): Secondary | ICD-10-CM

## 2020-05-26 DIAGNOSIS — G4733 Obstructive sleep apnea (adult) (pediatric): Secondary | ICD-10-CM

## 2020-05-26 NOTE — Procedures (Signed)
    Patient Name: Anthony [name], Anthony [name] Date: 05/24/2020 Gender: Male D.O.B: 05-05-1981 Age (years): 39 Referring Provider: Levy Pupa Height (inches): 68 Interpreting Physician: Coralyn Helling MD, ABSM Weight (lbs): 572 RPSGT: Ulyess Mort BMI: 87 MRN: [id number] Neck Size: 21.50  CLINICAL INFORMATION The patient is referred for a BiPAP titration to treat sleep apnea.  Date of NPSG 10/07/14: AHI 200, SpO2 low 63.3%.  SLEEP STUDY TECHNIQUE As per the AASM Manual for the Scoring of Sleep and Associated Events v2.3 (April 2016) with a hypopnea requiring 4% desaturations.  The channels recorded and monitored were frontal, central and occipital EEG, electrooculogram (EOG), submentalis EMG (chin), nasal and oral airflow, thoracic and abdominal wall motion, anterior tibialis EMG, snore microphone, electrocardiogram, and pulse oximetry. Bilevel positive airway pressure (BPAP) was initiated at the beginning of the study and titrated to treat sleep-disordered breathing.  MEDICATIONS Medications self-administered by patient taken the night of the study : N/A  RESPIRATORY PARAMETERS Optimal IPAP Pressure (cm): 27 AHI at Optimal Pressure (/hr) 1.5 Optimal EPAP Pressure (cm): 22   Overall Minimal O2 (%): 75.0 Minimal O2 at Optimal Pressure (%): 92.0  SLEEP ARCHITECTURE Start Time: 10:29:35 PM Stop Time: 4:48:29 AM Total Time (min): 378.9 Total Sleep Time (min): 296.8 Sleep Latency (min): 11.1 Sleep Efficiency (%): 78.3% REM Latency (min): 108.5 WASO (min): 71.0 Stage N1 (%): 9.5% Stage N2 (%): 76.8% Stage N3 (%): 0.0% Stage R (%): 13.7 Supine (%): 89.72 Arousal Index (/hr): 14.4   CARDIAC DATA The 2 lead EKG demonstrated sinus rhythm. The mean heart rate was 63.4 beats per minute. Other EKG findings include: PVCs.  LEG MOVEMENT DATA The total Periodic Limb Movements of Sleep (PLMS) were 0. The PLMS index was 0.0. A PLMS index of <15 is considered normal in adults.  IMPRESSIONS -  Optimal BPAP pressure of 27/22 cm H2O. - He did not require supplemental oxygen during this study.  DIAGNOSIS - Obstructive Sleep Apnea (G47.33)  RECOMMENDATIONS - Trial of BiPAP therapy on 27/22 cm H2O with a Medium Wide size Philips Respironics Full Face Mask Dreamwear mask and heated humidification. - Avoid alcohol, sedatives and other CNS depressants that may worsen sleep apnea and disrupt normal sleep architecture. - Sleep hygiene should be reviewed to assess factors that may improve sleep quality. - Weight management and regular exercise should be initiated or continued.  [Electronically signed] 05/26/2020 08:04 AM  Coralyn Helling MD, ABSM Diplomate, American Board of Sleep Medicine   NPI: [id number]

## 2020-06-02 ENCOUNTER — Other Ambulatory Visit: Payer: Self-pay | Admitting: *Deleted

## 2020-06-02 ENCOUNTER — Inpatient Hospital Stay (HOSPITAL_COMMUNITY): Admission: RE | Admit: 2020-06-02 | Payer: 59 | Source: Ambulatory Visit

## 2020-06-02 DIAGNOSIS — D862 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: Secondary | ICD-10-CM

## 2020-06-05 ENCOUNTER — Ambulatory Visit: Payer: 59 | Admitting: Pulmonary Disease

## 2020-06-05 ENCOUNTER — Ambulatory Visit: Payer: 59 | Admitting: Emergency Medicine

## 2020-06-12 MED ORDER — PREDNISONE 10 MG PO TABS: 10.0000 mg | ORAL_TABLET | Freq: Every day | ORAL | 0 refills | Status: DC

## 2020-07-11 MED ORDER — PREDNISONE 10 MG PO TABS: 10.0000 mg | ORAL_TABLET | Freq: Every day | ORAL | 0 refills | Status: DC

## 2020-08-15 ENCOUNTER — Ambulatory Visit (INDEPENDENT_AMBULATORY_CARE_PROVIDER_SITE_OTHER): Payer: 59 | Admitting: Emergency Medicine

## 2020-08-15 ENCOUNTER — Encounter: Payer: Self-pay | Admitting: Emergency Medicine

## 2020-08-15 ENCOUNTER — Other Ambulatory Visit: Payer: Self-pay

## 2020-08-15 DIAGNOSIS — G894 Chronic pain syndrome: Secondary | ICD-10-CM

## 2020-08-15 DIAGNOSIS — D862 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: Secondary | ICD-10-CM | POA: Diagnosis not present

## 2020-08-15 DIAGNOSIS — G4733 Obstructive sleep apnea (adult) (pediatric): Secondary | ICD-10-CM

## 2020-08-15 MED ORDER — PERCOCET 10-325 MG PO TABS: 1.0000 | ORAL_TABLET | ORAL | 0 refills | Status: DC | PRN

## 2020-08-15 NOTE — Assessment & Plan Note (Signed)
He stopped his methotrexate and his prednisone on his own.  Very difficult situation to manage.  Sometimes there have been financial barriers, sometimes issues with side effects especially his weight gain which clearly is a serious medical issue.  I have been trying to get him off prednisone altogether but have been unsuccessful using methotrexate.  He is now off of both.  I am not sure I can reliably get him on maintenance sarcoid therapy.  I suspect based on his history that he will require it.  I may need to send him to a tertiary center to consider Biologics.  I mentioned this to him today.  He wants to see how he does over the next several months on no maintenance therapy.  He hopes that he will lose weight.  At that time we can decide whether to refer him to another provider to consider alternatives for treatment of his sarcoidosis.

## 2020-08-15 NOTE — Assessment & Plan Note (Signed)
Has been seen in pain management before, has not currently connected with them.  He is asking me today for narcotics.  In fact I think this is the main thing he would like to accomplish today, not chronic treatment for sarcoid.  We will try to refer him out to pain management again.  I did give him a prescription for 20 Percocet 10/325 mg and will not refill this.  If the pain management issue becomes a barrier between [place] that he may need to change to a new pulmonologist.

## 2020-08-15 NOTE — Patient Instructions (Signed)
We will stay off your methotrexate and your prednisone for now.  We discussed today about the possible need to look for an alternative maintenance sarcoid therapy.  We may need to refer you to a tertiary center to accomplish this.  We can discuss next time. Use your triamcinolone cream on your palmar rash Keep your albuterol available use 2 puffs when you need if shortness of breath, chest tightness, wheezing. Continue to work on [employer], exercise and weight loss. A one-time prescription was written today for pain medication for your joint pain and neuropathy.  This is meant to address your pain until we can get you reestablished with pain management.  We will not be able to refill this at our office. We will refer you to Pain Management  Follow with Anthony [name] in July

## 2020-08-15 NOTE — Assessment & Plan Note (Signed)
His BiPAP titration study showed that his optimal BiPAP pressure of 27/22.  We need to send an order for this and for a new machine.

## 2020-08-15 NOTE — Progress Notes (Signed)
Subjective:    Patient ID: Anthony [name], male    DOB: Feb 06, 1981, 40 y.o.   MRN: [id number]  HPI  ROV 02/03/20 --40 year old man with sarcoidosis, significant cutaneous disease especially on the palms of his hands, lymphadenopathy.  Also with obesity, OSA/OHS on CPAP.  At his last visit we planned to increase his methotrexate to 12.5 mg weekly.  He was having some chest discomfort and tightness and we decided to try albuterol to see if he would get benefit - not sure that it has made a difference in his breathing. He missed his Cards referral, is going to reschedule. He remains on prednisone 20 mg daily with a goal to begin weaning soon. Good compliance w CPAP.   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  He has OSA/OHS, on CPAP.  We have been managing him on 12.5 mg weekly methotrexate.  Current prednisone dose is 10 mg daily (decreased last time in September).  His CBC, LFT have been reassuring, stable, last done in September.  He is working on obtaining permanent disability. He saw the cardiologist on 02/29/2020, reassuring TTE and ECG.   ROV 08/15/20 --Anthony [name] is 40 and returns today for follow-up of his sarcoidosis, obesity with OSA/OHS on CPAP.  We have been managing him on prednisone 10 mg daily, methotrexate 12.5 mg weekly plus folate.  He tells me that he has been off these medications: he quit MTX about 6 weeks ago, quit the pred since about 2 weeks ago - he has been very concerned about the associated wt gain, ? Some pain associated w the MTX. Since he came off the pred his breathing has been worse - more exertional SOB.  He has not used any albuterol. Not using steroid cream for his hands.   BiPAP titration study done 05/24/2020 reviewed showed optimal BiPAP pressures of 27/22 cmH2O.  He did not need oxygen bled in.   Review of Systems As per hpi     Objective:   Physical Exam Vitals:   08/15/20 1132   BP: 122/78  Pulse: 85  Temp: 97.8 F (36.6 C)  TempSrc: Temporal  SpO2: 96%  Weight: (!) 592 lb (268.5 kg)  Height: 5\' 8"  (1.727 m)   Gen: Pleasant, morbidly obese man, in no distress,  normal affect  ENT: No lesions,  mouth clear,  oropharynx clear, no postnasal drip  Neck: No JVD, no stridor  Lungs: No use of accessory muscles, no crackles or wheezing on normal respiration, no wheeze on forced expiration  Cardiovascular: RRR, heart sounds normal, no murmur or gallops, no peripheral edema  Musculoskeletal: No deformities, no cyanosis or clubbing  Neuro: alert, awake, non focal  Skin: He has a dry mixed hypopigmented with areas of dark rash on the palms of his hands, more so on his right hand than his left.  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  Very difficult situation to manage.  Sometimes there have been financial barriers, sometimes issues with side effects especially his weight gain which clearly is a serious medical issue.  I have been trying to get him off prednisone altogether but have been unsuccessful using methotrexate.  He is now off of both.  I am not sure I can reliably get him on maintenance sarcoid therapy.  I suspect based  on his history that he will require it.  I may need to send him to a tertiary center to consider Biologics.  I mentioned this to him today.  He wants to see how he does over the next several months on no maintenance therapy.  He hopes that he will lose weight.  At that time we can decide whether to refer him to another provider to consider alternatives for treatment of his sarcoidosis.  Obstructive sleep apnea His BiPAP titration study showed that his optimal BiPAP pressure of 27/22.  We need to send an order for this and for a new machine.  Chronic pain Has been seen in pain management before, has not currently  connected with them.  He is asking me today for narcotics.  In fact I think this is the main thing he would like to accomplish today, not chronic treatment for sarcoid.  We will try to refer him out to pain management again.  I did give him a prescription for 20 Percocet 10/325 mg and will not refill this.  If the pain management issue becomes a barrier between [place] that he may need to change to a new pulmonologist.   Levy Pupa, MD, PhD 08/15/2020, 5:40 PM [facility] [phone] or if no answer [phone]

## 2020-08-21 ENCOUNTER — Other Ambulatory Visit: Payer: Self-pay

## 2020-08-21 DIAGNOSIS — G4733 Obstructive sleep apnea (adult) (pediatric): Secondary | ICD-10-CM

## 2020-08-28 DIAGNOSIS — G894 Chronic pain syndrome: Secondary | ICD-10-CM

## 2020-08-28 NOTE — Telephone Encounter (Signed)
RB please advise of the Pain Management that you wanted to refer the pt to.  I did not see a referral in the system and pt wanted to know which office that referral was placed to.  Thanks

## 2020-08-29 ENCOUNTER — Telehealth: Payer: Self-pay | Admitting: Emergency Medicine

## 2020-08-29 NOTE — Telephone Encounter (Signed)
Called [facility] and the fax number to fax these OV notes to is [phone].  They stated that this is directly to the department that needs the documentation.    Last OV note has been faxed to them and nothing further is needed.

## 2020-09-04 NOTE — Telephone Encounter (Signed)
Agree with another referral to pain management. Thanks.

## 2020-09-05 NOTE — Telephone Encounter (Signed)
I had mentioned Dr Ollen Bowl, but I am not sure that he is still taking patients, so I made a general referral

## 2020-09-05 NOTE — Telephone Encounter (Signed)
Dr. Delton Coombes please advise  Thank you! What is the name of the pain Doctor? I know there was a specific Doctor that Dr.Bynum said he wanted me to try.

## 2020-09-13 ENCOUNTER — Encounter: Payer: 59 | Attending: Physical Medicine and Rehabilitation | Admitting: Physical Medicine and Rehabilitation

## 2020-09-13 ENCOUNTER — Encounter: Payer: Self-pay | Admitting: Physical Medicine and Rehabilitation

## 2020-09-13 ENCOUNTER — Other Ambulatory Visit: Payer: Self-pay

## 2020-09-13 VITALS — BP 170/103 | HR 76 | Temp 99.4°F | Ht 68.0 in | Wt >= 6400 oz

## 2020-09-13 DIAGNOSIS — Z79891 Long term (current) use of opiate analgesic: Secondary | ICD-10-CM | POA: Diagnosis present

## 2020-09-13 DIAGNOSIS — G894 Chronic pain syndrome: Secondary | ICD-10-CM | POA: Diagnosis not present

## 2020-09-13 DIAGNOSIS — M48062 Spinal stenosis, lumbar region with neurogenic claudication: Secondary | ICD-10-CM | POA: Insufficient documentation

## 2020-09-13 DIAGNOSIS — Z5181 Encounter for therapeutic drug level monitoring: Secondary | ICD-10-CM | POA: Insufficient documentation

## 2020-09-13 MED ORDER — TOPIRAMATE 25 MG PO TABS: 25.0000 mg | ORAL_TABLET | Freq: Every day | ORAL | 2 refills | Status: DC

## 2020-09-13 NOTE — Patient Instructions (Addendum)
1) Ginger (especially studied for arthritis)- reduce leukotriene production to decrease inflammation 2) Blueberries- high in phytonutrients that decrease inflammation 3) Salmon- marine omega-3s reduce joint swelling and pain 4) Pumpkin seeds- reduce inflammation 5) dark chocolate- reduces inflammation 6) turmeric- reduces inflammation 7) tart cherries - reduce pain and stiffness 8) extra virgin olive oil - its compound olecanthal helps to block prostaglandins  9) chili peppers- can be eaten or applied topically via capsaicin 10) mint- helpful for headache, muscle aches, joint pain, and itching 11) garlic- reduces inflammation  Link to further information on diet for chronic pain: [email or web address]  Weight loss:  -Discussed foods that can assist in weight loss: 1) leafy greens- high in fiber and nutrients 2) dark chocolate- improves metabolism (if prefer sweetened, best to sweeten with honey instead of sugar).  3) cruciferous vegetables- high in fiber and protein 4) full fat yogurt: high in healthy fat, protein, calcium, and probiotics 5) apples- high in a variety of phytochemicals 6) nuts- high in fiber and protein that increase feelings of fullness 7) grapefruit: rich in nutrients, antioxidants, and fiber (not to be taken with anticoagulation) 8) beans- high in protein and fiber 9) salmon- has high quality protein and healthy fats 10) green tea- rich in polyphenols 11) eggs- rich in choline and vitamin D 12) tuna- high protein, boosts metabolism 13) avocado- decreases visceral abdominal fat 14) chicken (pasture raised): high in protein and iron 15) blueberries- reduce abdominal fat and cholesterol 16) whole grains- decreases calories retained during digestion, speeds metabolism 17) chia seeds- curb appetite 18) chilies- increases fat metabolism

## 2020-09-13 NOTE — Progress Notes (Signed)
Subjective:    Patient ID: Anthony [name], male    DOB: 01/30/1981, 40 y.o.   MRN: [id number]  HPI  Anthony [name] is a 40 year old man who presents to establish care of right sided lower back pain radiating into his leg.   He has had this pain since 2013 when he got injured and required knee surgery. He realized he had back pain while doing PT for his knee pain.   He also has pain in his right knee  His was let go from his job as was unable to do the work he was normally unable to do.  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  He has been on prednisone to shrink the nodules on his lungs. It was amazing- he was crawling before this. He did gain 100 lbs with the prednisone. He had to stop this due to the weight gain and the risk of diabetes.   Pain Inventory Average Pain 7 Pain Right Now 8 My pain is sharp, burning, dull, stabbing, tingling and aching  In the last 24 hours, has pain interfered with the following? General activity 10 Relation with others 10 Enjoyment of life 10 What TIME of day is your pain at its worst? morning , daytime, evening and night Sleep (in general) Poor  Pain is worse with: walking, bending, sitting, inactivity, standing and some activites Pain improves with: nonthing Relief from Meds: 0  walk without assistance walk with assistance use a cane ability to climb steps?  no do you drive?  yes  employed # of hrs/week . what is your job? . I need assistance with the following:  dressing, bathing, toileting, meal prep, household duties and shopping  weakness numbness tingling trouble walking depression anxiety  new  new    Family History  Problem Relation Age of Onset  . Hypertension Father   . Diabetes Father   . Stroke Father   . Lymphoma Paternal Uncle   . Ovarian cancer Maternal Grandmother 8   Social History   Socioeconomic History  . Marital status: Married    Spouse name: Not on file  . Number of  children: Not on file  . Years of education: Not on file  . Highest education level: Not on file  Occupational History  . Occupation: Disabled  Tobacco Use  . Smoking status: Former Smoker    Packs/day: 0.30    Years: 10.00    Pack years: 3.00    Types: Cigarettes    Quit date: 09/30/2016    Years since quitting: 3.9  . Smokeless tobacco: Never Used  Vaping Use  . Vaping Use: Never used  Substance and Sexual Activity  . Alcohol use: No  . Drug use: No  . Sexual activity: Yes    Birth control/protection: Condom  Other Topics Concern  . Not on file  Social History Narrative  . Not on file   Social Determinants of Health   Financial Resource Strain: Not on file  Food Insecurity: Not on file  Transportation Needs: Not on file  Physical Activity: Not on file  Stress: Not on file  Social Connections: Not on file   Past Surgical History:  Procedure Laterality Date  . ARTHROSCOPY KNEE W/ DRILLING     right knee   . ENDOBRONCHIAL ULTRASOUND Bilateral 03/04/2017   Procedure: ENDOBRONCHIAL ULTRASOUND;  Surgeon: Leslye Peer, MD;  Location: [facility];  Service: Cardiopulmonary;  Laterality: Bilateral;  . left knee inner growth  plate removed     to straighten leg   Past Medical History:  Diagnosis Date  . Anxiety   . Depression   . Dyspnea   . Eczema of hand   . Headache    hx of with tooth pain  . Obese   . Pneumonia    12/2016  . Pre-diabetes   . Sarcoidosis   . Sleep apnea    cpap   BP (!) 170/103   Pulse 76   Temp 99.4 F (37.4 C)   Wt (!) 592 lb (268.5 kg) Comment: previous  SpO2 95%   BMI 90.01 kg/m   Opioid Risk Score:   Fall Risk Score:  `1  Depression screen PHQ 2/9  Depression screen [facility] 2/9 06/24/2018 02/03/2017 01/17/2017 01/09/2017  Decreased Interest 0 1 0 2  Down, Depressed, Hopeless 0 1 0 2  PHQ - 2 Score 0 2 0 4  Altered sleeping - 2 - 3  Tired, decreased energy - 1 - 3  Change in appetite - 1 - 2  Feeling bad or failure about  yourself  - 1 - 2  Trouble concentrating - 0 - 2  Moving slowly or fidgety/restless - 1 - 1  Suicidal thoughts - 0 - 1  PHQ-9 Score - 8 - 18    Review of Systems  Constitutional: Positive for diaphoresis and unexpected weight change.  HENT: Negative.   Eyes: Negative.   Respiratory: Positive for apnea and shortness of breath.   Gastrointestinal: Negative.   Endocrine: Negative.   Musculoskeletal: Positive for arthralgias, myalgias and neck pain.  Skin: Positive for rash.  Allergic/Immunologic: Negative.   Neurological: Positive for weakness and numbness.       Tingling   Hematological: Negative.   Psychiatric/Behavioral: Positive for dysphoric mood. The patient is nervous/anxious.   All other systems reviewed and are negative.      Objective:   Physical Exam Gen: no distress, normal appearing, 592 lbs HEENT: oral mucosa pink and moist, NCAT Cardio: Reg rate Chest: normal effort, normal rate of breathing Abd: soft, non-distended Ext: no edema Psych: pleasant, normal affect Skin: intact Neuro: Alert and oriented x3.  Musculoskeletal: Antalgic gait.     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  -Discussed current symptoms of pain and history of pain.  -discussed risks and benefits of diclofenac, topiramate, and percocet.  -UDS and pain contract today. If negative, will prescribe Percocet 5mg -325mg  BID.  -his wife has to help with everything.  -start topiramate 25mg  HS -Discussed benefits of exercise in reducing pain. -Discussed following foods that may reduce pain: 1) Ginger (especially studied for arthritis)- reduce leukotriene production to decrease inflammation 2) Blueberries- high in phytonutrients that decrease inflammation 3) Salmon- marine omega-3s reduce joint swelling and pain 4) Pumpkin seeds- reduce inflammation 5) dark chocolate- reduces inflammation 6) turmeric- reduces inflammation 7) tart cherries - reduce pain and  stiffness 8) extra virgin olive oil - its compound olecanthal helps to block prostaglandins  9) chili peppers- can be eaten or applied topically via capsaicin 10) mint- helpful for headache, muscle aches, joint pain, and itching 11) garlic- reduces inflammation  Link to further information on diet for chronic pain:  2) Obesity: -Discussed foods that can assist in weight loss: 1) leafy greens- high in fiber and nutrients 2) dark chocolate- improves metabolism (if prefer sweetened, best to sweeten with honey instead of sugar).  3) cruciferous vegetables- high in fiber and protein 4)  full fat yogurt: high in healthy fat, protein, calcium, and probiotics 5) apples- high in a variety of phytochemicals 6) nuts- high in fiber and protein that increase feelings of fullness 7) grapefruit: rich in nutrients, antioxidants, and fiber (not to be taken with anticoagulation) 8) beans- high in protein and fiber 9) salmon- has high quality protein and healthy fats 10) green tea- rich in polyphenols 11) eggs- rich in choline and vitamin D 12) tuna- high protein, boosts metabolism 13) avocado- decreases visceral abdominal fat 14) chicken (pasture raised): high in protein and iron 15) blueberries- reduce abdominal fat and cholesterol 16) whole grains- decreases calories retained during digestion, speeds metabolism 17) chia seeds- curb appetite 18) chilies- increases fat metabolism

## 2020-09-14 MED ORDER — DICLOFENAC SODIUM 75 MG PO TBEC: 75.0000 mg | DELAYED_RELEASE_TABLET | Freq: Two times a day (BID) | ORAL | 2 refills | Status: DC

## 2020-09-14 NOTE — Addendum Note (Signed)
Addended by: [name] Chin on: 09/14/2020 09:57 AM   Modules accepted: Orders

## 2020-09-20 ENCOUNTER — Other Ambulatory Visit: Payer: Self-pay | Admitting: Physical Medicine and Rehabilitation

## 2020-09-20 LAB — TOXASSURE SELECT,+ANTIDEPR,UR

## 2020-09-20 MED ORDER — OXYCODONE-ACETAMINOPHEN 5-325 MG PO TABS: 1.0000 | ORAL_TABLET | Freq: Two times a day (BID) | ORAL | 0 refills | Status: DC | PRN

## 2020-10-02 MED ORDER — TOPIRAMATE 25 MG PO TABS: 25.0000 mg | ORAL_TABLET | Freq: Every day | ORAL | 2 refills | Status: DC

## 2020-10-20 MED ORDER — OXYCODONE-ACETAMINOPHEN 5-325 MG PO TABS: 1.0000 | ORAL_TABLET | Freq: Two times a day (BID) | ORAL | 0 refills | Status: DC | PRN

## 2020-10-20 NOTE — Telephone Encounter (Signed)
Refilled pain meds #60- Percocet 5/325- last refilled 09/20/20- was due-

## 2020-10-20 NOTE — Telephone Encounter (Signed)
Sent in Rx- ML

## 2020-11-16 ENCOUNTER — Other Ambulatory Visit: Payer: Self-pay | Admitting: Physical Medicine and Rehabilitation

## 2020-11-16 MED ORDER — OXYCODONE-ACETAMINOPHEN 5-325 MG PO TABS: 1.0000 | ORAL_TABLET | Freq: Two times a day (BID) | ORAL | 0 refills | Status: DC | PRN

## 2020-12-05 ENCOUNTER — Telehealth: Payer: Self-pay | Admitting: Emergency Medicine

## 2020-12-05 NOTE — Telephone Encounter (Signed)
I have called and LM on VM for the pt to call back about this message.

## 2020-12-05 NOTE — Telephone Encounter (Signed)
Pt left a message on MyChart stating; "I have a cough that has developed. This cough is not upon exertion."  Pls regard; [phone].

## 2020-12-07 NOTE — Telephone Encounter (Addendum)
Called and spoke to pt. Pt states he has developed a cough and an increase in SOB. Pt states he would like an appt to see Dr. Delton Coombes. Pt is due for an appt per last OV. Appt scheduled to see Dr. Delton Coombes on 8/1, pt states he feels fine to wait till then for an appt. Pt advised to take an at home covid test. Pt aware to call back if test is positive. Pt also aware to call back or seek emergency care if any new or worsening s/s.

## 2020-12-11 ENCOUNTER — Ambulatory Visit: Payer: 59 | Admitting: Emergency Medicine

## 2020-12-14 ENCOUNTER — Other Ambulatory Visit: Payer: Self-pay | Admitting: Physical Medicine and Rehabilitation

## 2020-12-15 ENCOUNTER — Encounter: Payer: 59 | Attending: Physical Medicine and Rehabilitation | Admitting: Physical Medicine and Rehabilitation

## 2020-12-15 ENCOUNTER — Other Ambulatory Visit: Payer: Self-pay

## 2020-12-15 VITALS — BP 108/70 | HR 76 | Temp 98.8°F | Ht 68.0 in | Wt >= 6400 oz

## 2020-12-15 DIAGNOSIS — M48062 Spinal stenosis, lumbar region with neurogenic claudication: Secondary | ICD-10-CM | POA: Diagnosis not present

## 2020-12-15 MED ORDER — OXYCODONE-ACETAMINOPHEN 10-325 MG PO TABS: 1.0000 | ORAL_TABLET | Freq: Two times a day (BID) | ORAL | 0 refills | Status: DC | PRN

## 2020-12-15 NOTE — Addendum Note (Signed)
Addended by: [name] Chin on: 12/15/2020 03:46 PM   Modules accepted: Orders

## 2020-12-15 NOTE — Patient Instructions (Addendum)
Pain: 1) Ginger (especially studied for arthritis)- reduce leukotriene production to decrease inflammation 2) Blueberries- high in phytonutrients that decrease inflammation 3) Salmon- marine omega-3s reduce joint swelling and pain 4) Pumpkin seeds- reduce inflammation 5) dark chocolate- reduces inflammation 6) turmeric- reduces inflammation 7) tart cherries - reduce pain and stiffness 8) extra virgin olive oil - its compound olecanthal helps to block prostaglandins  9) chili peppers- can be eaten or applied topically via capsaicin 10) mint- helpful for headache, muscle aches, joint pain, and itching 11) garlic- reduces inflammation  Link to further information on diet for chronic pain: [email or web address].[email or web address]   Weight: 1) leafy greens- high in fiber and nutrients 2) dark chocolate- improves metabolism (if prefer sweetened, best to sweeten with honey instead of sugar).  3) cruciferous vegetables- high in fiber and protein 4) full fat yogurt: high in healthy fat, protein, calcium, and probiotics 5) apples- high in a variety of phytochemicals 6) nuts- high in fiber and protein that increase feelings of fullness 7) grapefruit: rich in nutrients, antioxidants, and fiber (not to be taken with anticoagulation) 8) beans- high in protein and fiber 9) salmon- has high quality protein and healthy fats 10) green tea- rich in polyphenols 11) eggs- rich in choline and vitamin D 12) tuna- high protein, boosts metabolism 13) avocado- decreases visceral abdominal fat 14) chicken (pasture raised): high in protein and iron 15) blueberries- reduce abdominal fat and cholesterol 16) whole grains- decreases calories retained during digestion, speeds metabolism 17) chia seeds- curb appetite 18) chilies- increases fat metabolism

## 2020-12-15 NOTE — Progress Notes (Signed)
Subjective:    Patient ID: Anthony [name], male    DOB: August 19, 1980, 40 y.o.   MRN: [id number]  HPI  Anthony [name] is a 40 year old man who presents for follow-up of right sided lower back pain radiating into his leg.   His wife has to assist him with his basic ADLs. He can no longer walk long distances. The level of pain that he is in is constant and he is awake at 3 or 4am in pain. His wife is tearful. His wife would like his life to return to the prior level of function. His back and knee pain is what limits him. Sometimes he feels motivated but once he gets up he can't do it. He used to work a job 10 hours per day. When he went into therapy he realized e wasn't mobile.   He has had this pain since 2013 when he got injured and required knee surgery. He realized he had back pain while doing PT for his knee pain.   He also has pain in his right knee  His was let go from his job as was unable to do the work he was normally unable to do.  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  He has been on prednisone to shrink the nodules on his lungs. It was amazing- he was crawling before this. He did gain 100 lbs with the prednisone. He had to stop this due to the weight gain and the risk of diabetes.   Pain Inventory Average Pain 7 Pain Right Now 8 My pain is sharp, burning, dull, stabbing, tingling and aching  In the last 24 hours, has pain interfered with the following? General activity 10 Relation with others 10 Enjoyment of life 10 What TIME of day is your pain at its worst? morning , daytime, evening and night Sleep (in general) Poor  Pain is worse with: walking, bending, sitting, inactivity, standing and some activites Pain improves with:  nonthing Relief from Meds: 0  walk without assistance walk with assistance use a cane ability to climb steps?  no do you drive?  yes  employed # of hrs/week . what is your job? . I need assistance with the following:   dressing, bathing, toileting, meal prep, household duties and shopping  weakness numbness tingling trouble walking depression anxiety  new  new    Family History  Problem Relation Age of Onset   Hypertension Father    Diabetes Father    Stroke Father    Lymphoma Paternal Uncle    Ovarian cancer Maternal Grandmother 9   Social History   Socioeconomic History   Marital status: Married    Spouse name: Not on file   Number of children: Not on file   Years of education: Not on file   Highest education level: Not on file  Occupational History   Occupation: Disabled  Tobacco Use   Smoking status: Former    Packs/day: 0.30    Years: 10.00    Pack years: 3.00    Types: Cigarettes    Quit date: 09/30/2016    Years since quitting: 4.2   Smokeless tobacco: Never  Vaping Use   Vaping Use: Never used  Substance and Sexual Activity   Alcohol use: No   Drug use: No   Sexual activity: Yes    Birth control/protection: Condom  Other Topics Concern   Not on file  Social History Narrative   Not on file  Social Determinants of Health   Financial Resource Strain: Not on file  Food Insecurity: Not on file  Transportation Needs: Not on file  Physical Activity: Not on file  Stress: Not on file  Social Connections: Not on file   Past Surgical History:  Procedure Laterality Date   ARTHROSCOPY KNEE W/ DRILLING     right knee    ENDOBRONCHIAL ULTRASOUND Bilateral 03/04/2017   Procedure: ENDOBRONCHIAL ULTRASOUND;  Surgeon: Leslye Peer, MD;  Location: [facility];  Service: Cardiopulmonary;  Laterality: Bilateral;   left knee inner growth plate removed     to straighten leg   Past Medical History:  Diagnosis Date   Anxiety    Depression    Dyspnea    Eczema of hand    Headache    hx of with tooth pain   Obese    Pneumonia    12/2016   Pre-diabetes    Sarcoidosis    Sleep apnea    cpap   BP 108/70 (BP Location: Left Arm, Patient Position: Sitting, Cuff  Size: Large) Comment (Cuff Size): thigh cuff  Pulse 76   Temp 98.8 F (37.1 C) (Oral)   Ht 5\' 8"  (1.727 m)   Wt (!) 562 lb 9.6 oz (255.2 kg)   SpO2 93%   BMI 85.54 kg/m   Opioid Risk Score:   Fall Risk Score:  `1  Depression screen PHQ 2/9  Depression screen [facility] 2/9 06/24/2018 02/03/2017 01/17/2017 01/09/2017  Decreased Interest 0 1 0 2  Down, Depressed, Hopeless 0 1 0 2  PHQ - 2 Score 0 2 0 4  Altered sleeping - 2 - 3  Tired, decreased energy - 1 - 3  Change in appetite - 1 - 2  Feeling bad or failure about yourself  - 1 - 2  Trouble concentrating - 0 - 2  Moving slowly or fidgety/restless - 1 - 1  Suicidal thoughts - 0 - 1  PHQ-9 Score - 8 - 18    Review of Systems  Constitutional:  Positive for diaphoresis and unexpected weight change.  HENT: Negative.    Eyes: Negative.   Respiratory:  Positive for apnea and shortness of breath.   Gastrointestinal: Negative.   Endocrine: Negative.   Musculoskeletal:  Positive for arthralgias, myalgias and neck pain.  Skin:  Positive for rash.  Allergic/Immunologic: Negative.   Neurological:  Positive for weakness and numbness.       Tingling   Hematological: Negative.   Psychiatric/Behavioral:  Positive for dysphoric mood. The patient is nervous/anxious.   All other systems reviewed and are negative.     Objective:   Physical Exam Gen: no distress, normal appearing, 562 lbs HEENT: oral mucosa pink and moist, NCAT Cardio: Reg rate Chest: normal effort, normal rate of breathing Abd: soft, non-distended Ext: no edema Psych: pleasant, normal affect Skin: intact Neuro: Alert and oriented x3.  Musculoskeletal: Antalgic gait.     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  -Discussed current symptoms of pain and history of pain.  -discussed risks and benefits of diclofenac, topiramate, and percocet.  -Continue Percocet 10-325mg  BID.  -his wife has to help with everything.  -continue  diclofenac -he completed physical and occupational therapy.  -d/c topiramate 25mg  HS -Discussed benefits of exercise in reducing pain. -Discussed following foods that may reduce pain: 1) Ginger (especially studied for arthritis)- reduce leukotriene production to decrease inflammation 2) Blueberries- high in phytonutrients that decrease inflammation 3) Salmon-  marine omega-3s reduce joint swelling and pain 4) Pumpkin seeds- reduce inflammation 5) dark chocolate- reduces inflammation 6) turmeric- reduces inflammation 7) tart cherries - reduce pain and stiffness 8) extra virgin olive oil - its compound olecanthal helps to block prostaglandins  9) chili peppers- can be eaten or applied topically via capsaicin 10) mint- helpful for headache, muscle aches, joint pain, and itching 11) garlic- reduces inflammation  Link to further information on diet for chronic pain: [email or web address].[email or web address]   2) Obesity:  -Will monitor weight every visit.  -Consider [place] tea daily.  -Discussed the benefits of intermittent fasting. -Discussed foods that can assist in weight loss: 1) leafy greens- high in fiber and nutrients 2) dark chocolate- improves metabolism (if prefer sweetened, best to sweeten with honey instead of sugar).  3) cruciferous vegetables- high in fiber and protein 4) full fat yogurt: high in healthy fat, protein, calcium, and probiotics 5) apples- high in a variety of phytochemicals 6) nuts- high in fiber and protein that increase feelings of fullness 7) grapefruit: rich in nutrients, antioxidants, and fiber (not to be taken with anticoagulation) 8) beans- high in protein and fiber 9) salmon- has high quality protein and healthy fats 10) green tea- rich in polyphenols 11) eggs- rich in choline and vitamin D 12) tuna- high protein, boosts metabolism 13) avocado- decreases visceral abdominal fat 14) chicken (pasture  raised): high in protein and iron 15) blueberries- reduce abdominal fat and cholesterol 16) whole grains- decreases calories retained during digestion, speeds metabolism 17) chia seeds- curb appetite 18) chilies- increases fat metabolism

## 2021-01-10 ENCOUNTER — Ambulatory Visit (INDEPENDENT_AMBULATORY_CARE_PROVIDER_SITE_OTHER): Payer: 59 | Admitting: Emergency Medicine

## 2021-01-10 ENCOUNTER — Encounter: Payer: Self-pay | Admitting: Emergency Medicine

## 2021-01-10 ENCOUNTER — Other Ambulatory Visit: Payer: Self-pay

## 2021-01-10 DIAGNOSIS — G894 Chronic pain syndrome: Secondary | ICD-10-CM

## 2021-01-10 DIAGNOSIS — G4733 Obstructive sleep apnea (adult) (pediatric): Secondary | ICD-10-CM | POA: Diagnosis not present

## 2021-01-10 DIAGNOSIS — D862 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: Secondary | ICD-10-CM | POA: Diagnosis not present

## 2021-01-10 NOTE — Assessment & Plan Note (Signed)
Continue your BiPAP every night.  We established good compliance and good clinical benefit today

## 2021-01-10 NOTE — Assessment & Plan Note (Signed)
We will repeat your CT scan of the chest to evaluate your sarcoidosis. Keep your albuterol available use 2 puffs when needed for shortness of breath, chest tightness, wheezing. Follow Dr. Delton Coombes next available after your CT scan so that we can review the results together.  We will decide at that time whether there may be benefit to retrying every day scheduled anti-inflammatory medication for your sarcoid.

## 2021-01-10 NOTE — Assessment & Plan Note (Signed)
From significant DJD.  He is following with the pain clinic and is benefiting

## 2021-01-10 NOTE — Progress Notes (Signed)
Subjective:    Patient ID: Anthony [name], male    DOB: 14-Jun-1980, 40 y.o.   MRN: [id number]  HPI  ROV 08/15/20 --Darren is 13 and returns today for follow-up of his sarcoidosis, obesity with OSA/OHS on CPAP.  We have been managing him on prednisone 10 mg daily, methotrexate 12.5 mg weekly plus folate.  He tells me that he has been off these medications: he quit MTX about 6 weeks ago, quit the pred since about 2 weeks ago - he has been very concerned about the associated wt gain, ? Some pain associated w the MTX. Since he came off the pred his breathing has been worse - more exertional SOB.  He has not used any albuterol. Not using steroid cream for his hands.   BiPAP titration study done 05/24/2020 reviewed showed optimal BiPAP pressures of 27/22 cmH2O.  He did not need oxygen bled in.  ROV 01/10/21 --40 year old man with a history of pulmonary and cutaneous sarcoidosis, obesity with OSA/OHS on BiPAP.  When I last saw him in April he had come off of his methotrexate and his prednisone.  He wanted to stay off of it and so we did not restart.  In July he developed cough and progressive dyspnea. No real URI sx. We did not treat with Pred. Today he reports that he is doing better. He is still waiting for his new BiPAP machine from [place]. He is reliable with his current BiPAP, good clinical benefit from the mask. Very rare albuterol use.  Last time I saw him he asked me for narcotics which I did give him.  I also referred him back to the pain center and this has been beneficial.  Last CT scan of his chest was 09/10/2017 with mediastinal adenopathy and right perihilar pulmonary parenchymal nodular disease, left upper lobe platelike atelectasis   Review of Systems As per hpi     Objective:   Physical Exam Vitals:   01/10/21 1211  BP: 138/80  Pulse: (!) 55  Temp: 98.3 F (36.8 C)  TempSrc: Oral  SpO2: 96%  Weight: (!) 558 lb (253.1 kg)  Height: 5\' 8"  (1.727 m)   Gen: Pleasant, morbidly obese  man, in no distress,  normal affect  ENT: No lesions,  mouth clear,  oropharynx clear, no postnasal drip  Neck: No JVD, no stridor  Lungs: No use of accessory muscles, no crackles or wheezing on normal respiration, no wheeze on forced expiration  Cardiovascular: RRR, heart sounds normal, no murmur or gallops, no peripheral edema  Musculoskeletal: No deformities, no cyanosis or clubbing  Neuro: alert, awake, non focal  Skin: He has a dry mixed hypopigmented with areas of dark rash on the palms of R hand, L is almost clear. 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 Keep your albuterol available use 2 puffs when needed for shortness of breath, chest tightness, wheezing. Follow Dr. next available after your CT scan so that we can review the results together.  We will decide at that time whether there may be benefit to retrying every day scheduled anti-inflammatory medication for your sarcoid.  Obstructive sleep apnea Continue your BiPAP every night.  We established good compliance and good clinical benefit today  Chronic pain From significant DJD.  He is following with the pain  clinic and is benefiting   Levy Pupa, MD, PhD 01/10/2021, 12:45 PM [facility] [phone] or if no answer [phone]

## 2021-01-10 NOTE — Patient Instructions (Addendum)
We will repeat your CT scan of the chest to evaluate your sarcoidosis. Keep your albuterol available use 2 puffs when needed for shortness of breath, chest tightness, wheezing. Continue your BiPAP every night. Continue to follow with the pain management clinic. Follow Dr. Delton Coombes next available after your CT scan so that we can review the results together.  We will decide at that time whether there may be benefit to retrying every day scheduled anti-inflammatory medication for your sarcoid.

## 2021-01-11 ENCOUNTER — Encounter: Payer: 59 | Attending: Physical Medicine and Rehabilitation | Admitting: Registered Nurse

## 2021-01-11 ENCOUNTER — Telehealth: Payer: Self-pay | Admitting: Registered Nurse

## 2021-01-11 ENCOUNTER — Encounter: Payer: Self-pay | Admitting: Registered Nurse

## 2021-01-11 ENCOUNTER — Other Ambulatory Visit: Payer: Self-pay

## 2021-01-11 VITALS — BP 143/86 | HR 87 | Temp 97.7°F | Ht 68.0 in | Wt >= 6400 oz

## 2021-01-11 DIAGNOSIS — M48062 Spinal stenosis, lumbar region with neurogenic claudication: Secondary | ICD-10-CM

## 2021-01-11 DIAGNOSIS — Z79891 Long term (current) use of opiate analgesic: Secondary | ICD-10-CM

## 2021-01-11 DIAGNOSIS — G894 Chronic pain syndrome: Secondary | ICD-10-CM | POA: Diagnosis present

## 2021-01-11 DIAGNOSIS — M25562 Pain in left knee: Secondary | ICD-10-CM | POA: Diagnosis present

## 2021-01-11 DIAGNOSIS — M25561 Pain in right knee: Secondary | ICD-10-CM | POA: Insufficient documentation

## 2021-01-11 DIAGNOSIS — M5416 Radiculopathy, lumbar region: Secondary | ICD-10-CM | POA: Diagnosis present

## 2021-01-11 DIAGNOSIS — G8929 Other chronic pain: Secondary | ICD-10-CM | POA: Insufficient documentation

## 2021-01-11 DIAGNOSIS — Z5181 Encounter for therapeutic drug level monitoring: Secondary | ICD-10-CM

## 2021-01-11 MED ORDER — OXYCODONE-ACETAMINOPHEN 10-325 MG PO TABS: 1.0000 | ORAL_TABLET | Freq: Two times a day (BID) | ORAL | 0 refills | Status: DC | PRN

## 2021-01-11 NOTE — Progress Notes (Signed)
Subjective:    Patient ID: Anthony [name], male    DOB: June 25, 1980, 40 y.o.   MRN: [id number]  HPI: Anthony [name] is a 40 y.o. male who returns for follow up appointment for chronic pain and medication refill. He states his  pain is located in his lower back radiating into his right lower extremity and bilateral knee pain. He rates his pain 7. His current exercise regime is walking  short distances and performing stretching exercises.  Anthony [name] reports he has a scheduled appointment with Anthony [name] regarding being prescribed Fentanyl,he also states he has to pay for this referral out of pocket. He states Anthony [name] is not in his network.   Expalin to Anthony [name] he wouldn't be prescribe his medications at both office, reviewed the narcotic policy. He verbalizes understanding. He states in the past he was given Fentanyl after a procedure and was able to function without being in pain, his desire is to return to being employed. We will continue to monitor.   Anthony [name] Morphine equivalent is 30.00 MME.   UDS ordered today.      Pain Inventory Average Pain 7 Pain Right Now 7 My pain is constant, sharp, burning, dull, stabbing, tingling, and aching  In the last 24 hours, has pain interfered with the following? General activity 10 Relation with others 10 Enjoyment of life 10 What TIME of day is your pain at its worst? morning , daytime, evening, and night Sleep (in general) Poor  Pain is worse with: walking, bending, sitting, inactivity, standing, and some activites Pain improves with: medication Relief from Meds: 5  Family History  Problem Relation Age of Onset   Hypertension Father    Diabetes Father    Stroke Father    Ovarian cancer Maternal Grandmother 58   Lymphoma Paternal Uncle    Social History   Socioeconomic History   Marital status: Married    Spouse name: Not on file   Number of children: Not on file   Years of education: Not on file   Highest  education level: Not on file  Occupational History   Occupation: Disabled  Tobacco Use   Smoking status: Former    Packs/day: 0.30    Years: 10.00    Pack years: 3.00    Types: Cigarettes    Quit date: 09/30/2016    Years since quitting: 4.2   Smokeless tobacco: Never  Vaping Use   Vaping Use: Never used  Substance and Sexual Activity   Alcohol use: No   Drug use: No   Sexual activity: Yes    Birth control/protection: Condom  Other Topics Concern   Not on file  Social History Narrative   Not on file   Social Determinants of Health   Financial Resource Strain: Not on file  Food Insecurity: Not on file  Transportation Needs: Not on file  Physical Activity: Not on file  Stress: Not on file  Social Connections: Not on file   Past Surgical History:  Procedure Laterality Date   ARTHROSCOPY KNEE W/ DRILLING     right knee    ENDOBRONCHIAL ULTRASOUND Bilateral 03/04/2017   Procedure: ENDOBRONCHIAL ULTRASOUND;  Surgeon: Leslye Peer, MD;  Location: [facility];  Service: Cardiopulmonary;  Laterality: Bilateral;   left knee inner growth plate removed     to straighten leg   Past Surgical History:  Procedure Laterality Date   ARTHROSCOPY KNEE W/ DRILLING     right knee  ENDOBRONCHIAL ULTRASOUND Bilateral 03/04/2017   Procedure: ENDOBRONCHIAL ULTRASOUND;  Surgeon: Leslye Peer, MD;  Location: [facility];  Service: Cardiopulmonary;  Laterality: Bilateral;   left knee inner growth plate removed     to straighten leg   Past Medical History:  Diagnosis Date   Anxiety    Depression    Dyspnea    Eczema of hand    Headache    hx of with tooth pain   Obese    Pneumonia    12/2016   Pre-diabetes    Sarcoidosis    Sleep apnea    cpap   BP (!) 143/86   Pulse 87   Temp 97.7 F (36.5 C)   Ht 5\' 8"  (1.727 m)   Wt (!) 559 lb 9.6 oz (253.8 kg)   SpO2 95%   BMI 85.09 kg/m   Opioid Risk Score:   Fall Risk Score:  `1  Depression screen PHQ 2/9  Depression  screen [facility] 2/9 06/24/2018 02/03/2017 01/17/2017 01/09/2017  Decreased Interest 0 1 0 2  Down, Depressed, Hopeless 0 1 0 2  PHQ - 2 Score 0 2 0 4  Altered sleeping - 2 - 3  Tired, decreased energy - 1 - 3  Change in appetite - 1 - 2  Feeling bad or failure about yourself  - 1 - 2  Trouble concentrating - 0 - 2  Moving slowly or fidgety/restless - 1 - 1  Suicidal thoughts - 0 - 1  PHQ-9 Score - 8 - 18    Review of Systems  Musculoskeletal:  Positive for back pain and gait problem.       Pain in both shoulders, pain in both elbows, pain on right side, pain in both knees, pain down right leg to the calf  All other systems reviewed and are negative.     Objective:   Physical Exam Vitals and nursing note reviewed.  Constitutional:      Appearance: Normal appearance. He is obese.  Cardiovascular:     Rate and Rhythm: Normal rate and regular rhythm.     Pulses: Normal pulses.     Heart sounds: Normal heart sounds.  Pulmonary:     Effort: Pulmonary effort is normal.     Breath sounds: Normal breath sounds.  Musculoskeletal:     Cervical back: Normal range of motion and neck supple.     Comments: Normal Muscle Bulk and Muscle Testing Reveals:  Upper Extremities: Full ROM and Muscle Strength 5/5 Lumbar Paraspinal Tenderness: L-4-L-5 Right Greater Trochanter Tenderness  Lower Extremities: Full ROM and Muscle Strength 5/5 Arises from Table Slowly Antalgic Gait     Skin:    General: Skin is warm and dry.  Neurological:     Mental Status: He is alert and oriented to person, place, and time.  Psychiatric:        Mood and Affect: Mood normal.        Behavior: Behavior normal.         Assessment & Plan:  Right Lumbar Radiculitis/ Spinal Stenosis : Continue HEP as Tolerated and Continue to Monitor.  Refilled: Oxycodone 10/325 mg one tablet twice a day as needed for pain #60. We will continue the opioid monitoring program, this consists of regular [name] visits, examinations, urine  drug screen, pill counts as well as use of 01/11/2017 Controlled Substance Reporting system. A 12 month History has been reviewed on the [place] Controlled Substance Reporting System on 01/11/2021.  2. Chronic Pain  Syndrome: Continue HEP as Tolerated. Continue to Monitor.  3. Morbid Obesity: Continue Healthy Diet Regimen. Continue to Monitor.  4. Chronic Bilateral Knee Pain: Continue HEP as Tolerated. Continue to Monitor.    F/U in 1 month

## 2021-01-11 NOTE — Telephone Encounter (Signed)
Wife came into clinic and asked why rx has not been called in yet.  Then called husband and we notified we would send provider message.  He states he thought it would be filled after he came back from lab.  Patient asked if this was normal procedure, advised to refer to contract.

## 2021-01-12 ENCOUNTER — Telehealth: Payer: Self-pay | Admitting: *Deleted

## 2021-01-12 NOTE — Telephone Encounter (Signed)
Prior auth for oxycodone acetaminophen 10/325 #[id number] submitted over the  phone with [facility]. Await decision.

## 2021-01-16 NOTE — Telephone Encounter (Signed)
Approved x 1 month(01/12/21-02/10/21).  If more Rx needed, then new PA must be submitted.

## 2021-01-17 LAB — TOXASSURE SELECT,+ANTIDEPR,UR

## 2021-01-18 ENCOUNTER — Telehealth: Payer: Self-pay | Admitting: *Deleted

## 2021-01-18 NOTE — Telephone Encounter (Signed)
Urine drug screen for this encounter is consistent for prescribed medication 

## 2021-01-19 ENCOUNTER — Inpatient Hospital Stay: Admission: RE | Admit: 2021-01-19 | Payer: 59 | Source: Ambulatory Visit

## 2021-02-09 ENCOUNTER — Encounter: Payer: Self-pay | Admitting: Registered Nurse

## 2021-02-09 ENCOUNTER — Other Ambulatory Visit: Payer: Self-pay

## 2021-02-09 ENCOUNTER — Encounter: Payer: 59 | Admitting: Registered Nurse

## 2021-02-09 VITALS — BP 160/96 | HR 60 | Ht 68.0 in | Wt >= 6400 oz

## 2021-02-09 DIAGNOSIS — Z5181 Encounter for therapeutic drug level monitoring: Secondary | ICD-10-CM

## 2021-02-09 DIAGNOSIS — M25561 Pain in right knee: Secondary | ICD-10-CM

## 2021-02-09 DIAGNOSIS — M48062 Spinal stenosis, lumbar region with neurogenic claudication: Secondary | ICD-10-CM

## 2021-02-09 DIAGNOSIS — M5416 Radiculopathy, lumbar region: Secondary | ICD-10-CM

## 2021-02-09 DIAGNOSIS — Z79891 Long term (current) use of opiate analgesic: Secondary | ICD-10-CM

## 2021-02-09 DIAGNOSIS — G8929 Other chronic pain: Secondary | ICD-10-CM

## 2021-02-09 DIAGNOSIS — G894 Chronic pain syndrome: Secondary | ICD-10-CM | POA: Diagnosis not present

## 2021-02-09 DIAGNOSIS — M25562 Pain in left knee: Secondary | ICD-10-CM

## 2021-02-09 MED ORDER — OXYCODONE-ACETAMINOPHEN 10-325 MG PO TABS: 1.0000 | ORAL_TABLET | Freq: Two times a day (BID) | ORAL | 0 refills | Status: DC | PRN

## 2021-02-09 NOTE — Progress Notes (Signed)
Subjective:    Patient ID: Anthony [name], male    DOB: Oct 07, 1980, 40 y.o.   MRN: [id number]  HPI: Anthony [name] is a 40 y.o. male who returns for follow up appointment for chronic pain and medication refill. He states his pain is located in his lower back and occasionally radiates into his right lower extremity and right hip. Also reports bilateral knee pain. He rates his pain 7. His current exercise regime is walking and performing stretching exercises.  Anthony [name] Morphine equivalent is 30.00   MME.   Last UDS was Performed on 01/11/2021, it was consistent.     Pain Inventory Average Pain 8 Pain Right Now 7 My pain is constant, sharp, burning, dull, stabbing, tingling, and aching  In the last 24 hours, has pain interfered with the following? General activity 10 Relation with others 10 Enjoyment of life 10 What TIME of day is your pain at its worst? morning , evening, and night Sleep (in general) Poor  Pain is worse with: walking, bending, sitting, and standing Pain improves with: rest and medication Relief from Meds: 4  Family History  Problem Relation Age of Onset   Hypertension Father    Diabetes Father    Stroke Father    Ovarian cancer Maternal Grandmother 49   Lymphoma Paternal Uncle    Social History   Socioeconomic History   Marital status: Married    Spouse name: Not on file   Number of children: Not on file   Years of education: Not on file   Highest education level: Not on file  Occupational History   Occupation: Disabled  Tobacco Use   Smoking status: Former    Packs/day: 0.30    Years: 10.00    Pack years: 3.00    Types: Cigarettes    Quit date: 09/30/2016    Years since quitting: 4.3   Smokeless tobacco: Never  Vaping Use   Vaping Use: Never used  Substance and Sexual Activity   Alcohol use: No   Drug use: No   Sexual activity: Yes    Birth control/protection: Condom  Other Topics Concern   Not on file  Social History Narrative    Not on file   Social Determinants of Health   Financial Resource Strain: Not on file  Food Insecurity: Not on file  Transportation Needs: Not on file  Physical Activity: Not on file  Stress: Not on file  Social Connections: Not on file   Past Surgical History:  Procedure Laterality Date   ARTHROSCOPY KNEE W/ DRILLING     right knee    ENDOBRONCHIAL ULTRASOUND Bilateral 03/04/2017   Procedure: ENDOBRONCHIAL ULTRASOUND;  Surgeon: Leslye Peer, MD;  Location: [facility];  Service: Cardiopulmonary;  Laterality: Bilateral;   left knee inner growth plate removed     to straighten leg   Past Surgical History:  Procedure Laterality Date   ARTHROSCOPY KNEE W/ DRILLING     right knee    ENDOBRONCHIAL ULTRASOUND Bilateral 03/04/2017   Procedure: ENDOBRONCHIAL ULTRASOUND;  Surgeon: Leslye Peer, MD;  Location: [facility];  Service: Cardiopulmonary;  Laterality: Bilateral;   left knee inner growth plate removed     to straighten leg   Past Medical History:  Diagnosis Date   Anxiety    Depression    Dyspnea    Eczema of hand    Headache    hx of with tooth pain   Obese    Pneumonia  12/2016   Pre-diabetes    Sarcoidosis    Sleep apnea    cpap   BP (!) 160/96   Pulse (!) 53   Ht 5\' 8"  (1.727 m)   Wt (!) 564 lb 9.6 oz (256.1 kg)   SpO2 96%   BMI 85.85 kg/m   Opioid Risk Score:   Fall Risk Score:  `1  Depression screen PHQ 2/9  Depression screen [facility] 2/9 02/09/2021 01/11/2021 06/24/2018 02/03/2017 01/17/2017 01/09/2017  Decreased Interest 1 1 0 1 0 2  Down, Depressed, Hopeless 1 1 0 1 0 2  PHQ - 2 Score 2 2 0 2 0 4  Altered sleeping - - - 2 - 3  Tired, decreased energy - - - 1 - 3  Change in appetite - - - 1 - 2  Feeling bad or failure about yourself  - - - 1 - 2  Trouble concentrating - - - 0 - 2  Moving slowly or fidgety/restless - - - 1 - 1  Suicidal thoughts - - - 0 - 1  PHQ-9 Score - - - 8 - 18     Review of Systems  Constitutional: Negative.   HENT:  Negative.    Eyes: Negative.   Respiratory: Negative.    Cardiovascular: Negative.   Gastrointestinal: Negative.   Endocrine: Negative.   Genitourinary: Negative.   Musculoskeletal:  Positive for back pain.       Bil knee pain  Skin: Negative.   Allergic/Immunologic: Negative.   Neurological:        Tingling  Hematological: Negative.   Psychiatric/Behavioral:  Positive for dysphoric mood.   All other systems reviewed and are negative.     Objective:   Physical Exam Vitals and nursing note reviewed.  Constitutional:      Appearance: Normal appearance. He is obese.  Cardiovascular:     Rate and Rhythm: Normal rate and regular rhythm.     Pulses: Normal pulses.     Heart sounds: Normal heart sounds.  Pulmonary:     Effort: Pulmonary effort is normal.     Breath sounds: Normal breath sounds.  Musculoskeletal:     Cervical back: Normal range of motion and neck supple.     Comments: Normal Muscle Bulk and Muscle Testing Reveals:  Upper Extremities: Full ROM and Muscle Strength 5/5  Lumbar Paraspinal Tenderness: L-4-L-5 Lower Extremities: Decreased ROM and Muscle Strength 5/5 Bilateral Lower Extremities Flexion Produces Pain into his Bilateral Patella's Arises from Table Slowly Wide Based Gait     Skin:    General: Skin is warm and dry.  Neurological:     Mental Status: He is alert and oriented to person, place, and time.  Psychiatric:        Mood and Affect: Mood normal.        Behavior: Behavior normal.         Assessment & Plan:  Right Lumbar Radiculitis/ Spinal Stenosis : Continue HEP as Tolerated and Continue to Monitor. 02/09/2021 Refilled: Oxycodone 10/325 mg one tablet twice a day as needed for pain #60. We will continue the opioid monitoring program, this consists of regular clinic visits, examinations, urine drug screen, pill counts as well as use of 02/11/2021 Controlled Substance Reporting system. A 12 month History has been reviewed on the [place] Controlled Substance Reporting System on 02/09/2021.  2. Chronic Pain Syndrome: Continue HEP as Tolerated. Continue to Monitor. 02/09/2021 3. Morbid Obesity: Continue Healthy Diet Regimen. Continue to Monitor. 02/09/2021  4. Chronic Bilateral Knee Pain: Continue HEP as Tolerated. Continue to Monitor. 02/09/2021     F/U in 1 month

## 2021-03-06 ENCOUNTER — Other Ambulatory Visit: Payer: Self-pay

## 2021-03-06 ENCOUNTER — Encounter: Payer: Self-pay | Admitting: Registered Nurse

## 2021-03-06 ENCOUNTER — Encounter: Payer: 59 | Attending: Physical Medicine and Rehabilitation | Admitting: Registered Nurse

## 2021-03-06 VITALS — BP 135/93 | HR 68 | Ht 68.0 in | Wt >= 6400 oz

## 2021-03-06 DIAGNOSIS — M48062 Spinal stenosis, lumbar region with neurogenic claudication: Secondary | ICD-10-CM | POA: Insufficient documentation

## 2021-03-06 DIAGNOSIS — M25561 Pain in right knee: Secondary | ICD-10-CM | POA: Diagnosis present

## 2021-03-06 DIAGNOSIS — G894 Chronic pain syndrome: Secondary | ICD-10-CM | POA: Insufficient documentation

## 2021-03-06 DIAGNOSIS — Z79891 Long term (current) use of opiate analgesic: Secondary | ICD-10-CM | POA: Insufficient documentation

## 2021-03-06 DIAGNOSIS — G8929 Other chronic pain: Secondary | ICD-10-CM | POA: Insufficient documentation

## 2021-03-06 DIAGNOSIS — Z5181 Encounter for therapeutic drug level monitoring: Secondary | ICD-10-CM | POA: Diagnosis present

## 2021-03-06 DIAGNOSIS — M25562 Pain in left knee: Secondary | ICD-10-CM | POA: Insufficient documentation

## 2021-03-06 DIAGNOSIS — M5416 Radiculopathy, lumbar region: Secondary | ICD-10-CM | POA: Diagnosis present

## 2021-03-06 MED ORDER — OXYCODONE-ACETAMINOPHEN 10-325 MG PO TABS: 1.0000 | ORAL_TABLET | Freq: Three times a day (TID) | ORAL | 0 refills | Status: DC | PRN

## 2021-03-06 NOTE — Progress Notes (Signed)
Subjective:    Patient ID: Anthony [name], male    DOB: [birth year], 40 y.o.   MRN: [id number]  HPI: Anthony [name] is a 40 y.o. male who returns for follow up appointment for chronic pain and medication refill. He states his pain is located in his lower back radiating into his right hip and bilateral knee pain.  He also reports his pain has increased in intensity of lower back pain, he only receiving 3- 4 hours of pain relief with his current medication regimen. We will increase his frequency to three times a day as needed for pain, he verbalizes understanding. He rates his pain 8. His current exercise regime is walking and performing stretching exercises.  Mr. Guard Morphine equivalent is 30.00 MME.   Last UDS was Performed on 01/11/2021, it was consistent.      Pain Inventory Average Pain 8 Pain Right Now 8 My pain is constant, sharp, burning, dull, stabbing, and tingling  In the last 24 hours, has pain interfered with the following? General activity 10 Relation with others 10 Enjoyment of life 10 What TIME of day is your pain at its worst? morning , daytime, evening, and night Sleep (in general) Poor  Pain is worse with: walking, bending, sitting, inactivity, and standing Pain improves with: medication Relief from Meds: 5  Family History  Problem Relation Age of Onset   Hypertension Father    Diabetes Father    Stroke Father    Ovarian cancer Maternal Grandmother 56   Lymphoma Paternal Uncle    Social History   Socioeconomic History   Marital status: Married    Spouse name: Not on file   Number of children: Not on file   Years of education: Not on file   Highest education level: Not on file  Occupational History   Occupation: Disabled  Tobacco Use   Smoking status: Former    Packs/day: 0.30    Years: 10.00    Pack years: 3.00    Types: Cigarettes    Quit date: 09/30/2016    Years since quitting: 4.4   Smokeless tobacco: Never  Vaping Use   Vaping Use:  Never used  Substance and Sexual Activity   Alcohol use: No   Drug use: No   Sexual activity: Yes    Birth control/protection: Condom  Other Topics Concern   Not on file  Social History Narrative   Not on file   Social Determinants of Health   Financial Resource Strain: Not on file  Food Insecurity: Not on file  Transportation Needs: Not on file  Physical Activity: Not on file  Stress: Not on file  Social Connections: Not on file   Past Surgical History:  Procedure Laterality Date   ARTHROSCOPY KNEE W/ DRILLING     right knee    ENDOBRONCHIAL ULTRASOUND Bilateral 03/04/2017   Procedure: ENDOBRONCHIAL ULTRASOUND;  Surgeon: Leslye Peer, MD;  Location: [facility];  Service: Cardiopulmonary;  Laterality: Bilateral;   left knee inner growth plate removed     to straighten leg   Past Surgical History:  Procedure Laterality Date   ARTHROSCOPY KNEE W/ DRILLING     right knee    ENDOBRONCHIAL ULTRASOUND Bilateral 03/04/2017   Procedure: ENDOBRONCHIAL ULTRASOUND;  Surgeon: Leslye Peer, MD;  Location: [facility];  Service: Cardiopulmonary;  Laterality: Bilateral;   left knee inner growth plate removed     to straighten leg   Past Medical History:  Diagnosis Date   Anxiety  Depression    Dyspnea    Eczema of hand    Headache    hx of with tooth pain   Obese    Pneumonia    12/2016   Pre-diabetes    Sarcoidosis    Sleep apnea    cpap   BP (!) 182/77   Pulse 62   Ht 5\' 8"  (1.727 m)   Wt (!) 565 lb 3.2 oz (256.4 kg)   SpO2 95%   BMI 85.94 kg/m   Opioid Risk Score:   Fall Risk Score:  `1  Depression screen PHQ 2/9  Depression screen [facility] 2/9 03/06/2021 02/09/2021 01/11/2021 06/24/2018 02/03/2017 01/17/2017 01/09/2017  Decreased Interest 1 1 1  0 1 0 2  Down, Depressed, Hopeless 1 1 1  0 1 0 2  PHQ - 2 Score 2 2 2  0 2 0 4  Altered sleeping - - - - 2 - 3  Tired, decreased energy - - - - 1 - 3  Change in appetite - - - - 1 - 2  Feeling bad or failure about  yourself  - - - - 1 - 2  Trouble concentrating - - - - 0 - 2  Moving slowly or fidgety/restless - - - - 1 - 1  Suicidal thoughts - - - - 0 - 1  PHQ-9 Score - - - - 8 - 18    Review of Systems  Musculoskeletal:  Positive for back pain, gait problem and neck pain.       Pain in elbow joints, right leg  All other systems reviewed and are negative.     Objective:   Physical Exam Vitals and nursing note reviewed.  Constitutional:      Appearance: Normal appearance.  Cardiovascular:     Rate and Rhythm: Normal rate and regular rhythm.     Pulses: Normal pulses.     Heart sounds: Normal heart sounds.  Pulmonary:     Effort: Pulmonary effort is normal.     Breath sounds: Normal breath sounds.  Musculoskeletal:     Cervical back: Normal range of motion and neck supple.     Comments: Normal Muscle Bulk and Muscle Testing Reveals:  Upper Extremities: Full ROM and Muscle Strength  5/5 Thoracic Paraspinal Tenderness: T-3-T-4 Lumbar Paraspinal Tenderness: L-4-L-5 Lower Extremities: Decreased ROM and Muscle Strength 5/5 Bilateral Lower Extremities Flexion Produces Pin into his Patella Arises from Table slowly Antalgic  Gait     Skin:    General: Skin is warm and dry.  Neurological:     Mental Status: He is alert and oriented to person, place, and time.  Psychiatric:        Mood and Affect: Mood normal.        Behavior: Behavior normal.         Assessment & Plan:  1.Right Lumbar Radiculitis/ Spinal Stenosis : Continue HEP as Tolerated and Continue to Monitor. 03/06/2021 Refilled: Increased: Oxycodone 10/325 mg one tablet three times a day as needed for pain #90. We will continue the opioid monitoring program, this consists of regular clinic visits, examinations, urine drug screen, pill counts as well as use of Controlled Substance Reporting system. A 12 month History has been reviewed on the Controlled Substance Reporting System on 03/06/2021.  2.  Chronic Pain Syndrome: Continue HEP as Tolerated. Continue to Monitor. 03/06/2021 3. Morbid Obesity: Continue Healthy Diet Regimen. Continue to Monitor. 03/06/2021 4. Chronic Bilateral Knee Pain: Continue HEP as Tolerated. Continue to  Monitor. 03/06/2021     F/U in 1 month

## 2021-03-13 ENCOUNTER — Ambulatory Visit: Payer: 59 | Admitting: Registered Nurse

## 2021-03-26 ENCOUNTER — Other Ambulatory Visit: Payer: Self-pay | Admitting: Physical Medicine and Rehabilitation

## 2021-03-26 NOTE — Telephone Encounter (Signed)
Refill for Diclofenac sent from pharmacy. Warning appeared during refill for duplication with diclofenac and ibuprofen. Would you like to refill?

## 2021-04-03 ENCOUNTER — Encounter: Payer: 59 | Admitting: Registered Nurse

## 2021-04-03 ENCOUNTER — Telehealth: Payer: Self-pay | Admitting: Registered Nurse

## 2021-04-03 MED ORDER — OXYCODONE-ACETAMINOPHEN 10-325 MG PO TABS: 1.0000 | ORAL_TABLET | Freq: Three times a day (TID) | ORAL | 0 refills | Status: DC | PRN

## 2021-04-03 NOTE — Telephone Encounter (Signed)
Patient will need  refill on percocet.  Had to cancel today's visit, having car trouble.

## 2021-04-03 NOTE — Telephone Encounter (Signed)
PMP was reviewed, Oxycodone e-scribed today.  [name] had car issues and his appointment was re-scheduled. He is aware of the above.

## 2021-04-20 ENCOUNTER — Encounter: Payer: 59 | Admitting: Registered Nurse

## 2021-04-26 ENCOUNTER — Telehealth: Payer: Self-pay | Admitting: Emergency Medicine

## 2021-04-26 ENCOUNTER — Other Ambulatory Visit: Payer: Self-pay | Admitting: Emergency Medicine

## 2021-04-26 NOTE — Telephone Encounter (Signed)
Pt needs appt any time between 06-25-20/07-24-20 for CPAP compliance purposes. I called him and there was no answer, had to [facility]   Sending to front desk pool for f/u

## 2021-04-27 ENCOUNTER — Other Ambulatory Visit: Payer: Self-pay | Admitting: Physical Medicine and Rehabilitation

## 2021-05-08 ENCOUNTER — Telehealth: Payer: Self-pay

## 2021-05-08 NOTE — Telephone Encounter (Signed)
Spoke with [employer] r/t partial denial letter received  via fax. Representative stated that they would need documentation stating trial and failure of C-Pap therapy. Most recent sleep study documents states bi-pap is needed. Spoke with Melissa with [employer] which is pt's DME provider who states she will look into what documentation [facility] needs and notifying us of what needs to be done. Will leave encounter open for updates.   Routing to Dr. Delton Coombes as Lorain Childes

## 2021-05-09 ENCOUNTER — Telehealth: Payer: Self-pay | Admitting: Emergency Medicine

## 2021-05-09 NOTE — Telephone Encounter (Signed)
Spoke with the pt  He is asking for new rx for pred  He states past few wks having increased DOE and feels that it's his sarcoid  Also having increased joint pain  Acute visit scheduled with Dr Francine Graven for tomorrow at 1:30 pm

## 2021-05-10 ENCOUNTER — Ambulatory Visit (INDEPENDENT_AMBULATORY_CARE_PROVIDER_SITE_OTHER): Payer: 59 | Admitting: Pulmonary Disease

## 2021-05-10 ENCOUNTER — Encounter: Payer: Self-pay | Admitting: Pulmonary Disease

## 2021-05-10 ENCOUNTER — Other Ambulatory Visit: Payer: Self-pay

## 2021-05-10 VITALS — BP 118/76 | HR 62 | Temp 98.0°F | Ht 68.0 in | Wt >= 6400 oz

## 2021-05-10 DIAGNOSIS — D862 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: Secondary | ICD-10-CM

## 2021-05-10 DIAGNOSIS — D863 Sarcoidosis of skin: Secondary | ICD-10-CM

## 2021-05-10 LAB — CBC WITH DIFFERENTIAL/PLATELET
Basophils Absolute: 0.1 10*3/uL (ref 0.0–0.1)
Basophils Relative: 0.9 % (ref 0.0–3.0)
Eosinophils Absolute: 0.3 10*3/uL (ref 0.0–0.7)
Eosinophils Relative: 4 % (ref 0.0–5.0)
HCT: 44.4 % (ref 39.0–52.0)
Hemoglobin: 14.6 g/dL (ref 13.0–17.0)
Lymphocytes Relative: 33 % (ref 12.0–46.0)
Lymphs Abs: 2.7 10*3/uL (ref 0.7–4.0)
MCHC: 33 g/dL (ref 30.0–36.0)
MCV: 92.5 fl (ref 78.0–100.0)
Monocytes Absolute: 0.7 10*3/uL (ref 0.1–1.0)
Monocytes Relative: 8 % (ref 3.0–12.0)
Neutro Abs: 4.4 10*3/uL (ref 1.4–7.7)
Neutrophils Relative %: 54.1 % (ref 43.0–77.0)
Platelets: 283 10*3/uL (ref 150.0–400.0)
RBC: 4.8 Mil/uL (ref 4.22–5.81)
RDW: 14.5 % (ref 11.5–15.5)
WBC: 8.2 10*3/uL (ref 4.0–10.5)

## 2021-05-10 LAB — COMPREHENSIVE METABOLIC PANEL
ALT: 21 U/L (ref 0–53)
AST: 19 U/L (ref 0–37)
Albumin: 4.1 g/dL (ref 3.5–5.2)
Alkaline Phosphatase: 66 U/L (ref 39–117)
BUN: 10 mg/dL (ref 6–23)
CO2: 29 mEq/L (ref 19–32)
Calcium: 9 mg/dL (ref 8.4–10.5)
Chloride: 103 mEq/L (ref 96–112)
Creatinine, Ser: 0.93 mg/dL (ref 0.40–1.50)
GFR: 103.52 mL/min (ref >60.00)
Glucose, Bld: 90 mg/dL (ref 70–99)
Potassium: 4 mEq/L (ref 3.5–5.1)
Sodium: 138 mEq/L (ref 135–145)
Total Bilirubin: 0.4 mg/dL (ref 0.2–1.2)
Total Protein: 7.9 g/dL (ref 6.0–8.3)

## 2021-05-10 MED ORDER — PREDNISONE 10 MG PO TABS: 30.0000 mg | ORAL_TABLET | Freq: Every day | ORAL | 1 refills | Status: DC

## 2021-05-10 NOTE — Patient Instructions (Addendum)
Start prednisone 30mg  (3 tabs) daily for return of your sarcoidosis symptoms  I will discuss with Dr. if we should resume methotrexate therapy.   We will check labs today and call you with further instructions.

## 2021-05-10 NOTE — Progress Notes (Signed)
Synopsis: Referred in December 2022 for acute visit, concern for flare in sarcoidosis. Followed by Dr. Lamonte Sakai.  Subjective:   PATIENT ID: Anthony [name] GENDER: male DOB: 1980/06/27, MRN: [id number]   HPI  Chief Complaint  Patient presents with   Acute Visit    Increased SOB for the past 2 weeks. Denies any coughing.    Anthony [name] is a 40 year old male, former smoker with OSA/OHS on CPAP and pulmonary/cutaneous sarcoidosis who returns to pulmonary clinic for acute visit.   He reports increasing shortness of breath, bilateral knee pain and progressive skin changes involving the palms of his hands bilaterally. He reports the skin involvement on his left palm is new.   He was diagnosed with sarcoidosis in 2019 via mediastinal lymph node biopsy. He was started on prednisone with improvement in his symptoms but once the steroids were tapered his symptoms returned. He was then resumed on prednisone and started on methotrexate and titrated to 12.44m weekly prior to tapering steroids again. He was tapered down to 13mof prednisone in Fall of 2021. He then reported self discontinuation of methotrexate and prednisone in 07/2020 due to weight gain and reported joint paint he associated with the methotrexate. At the 08/15/20 office visit he wished to remain off prednisone and methotrexate.   He reports since 12/2020 he has noticed progressive skin changes of his hands then return of his joint pains and shortness of breath.  Past Medical History:  Diagnosis Date   Anxiety    Depression    Dyspnea    Eczema of hand    Headache    hx of with tooth pain   Obese    Pneumonia    12/2016   Pre-diabetes    Sarcoidosis    Sleep apnea    cpap     Family History  Problem Relation Age of Onset   Hypertension Father    Diabetes Father    Stroke Father    Ovarian cancer Maternal Grandmother [id number] Lymphoma Paternal Uncle      Social History   Socioeconomic History   Marital status: Married     Spouse name: Not on file   Number of children: Not on file   Years of education: Not on file   Highest education level: Not on file  Occupational History   Occupation: Disabled  Tobacco Use   Smoking status: Former    Packs/day: 0.30    Years: 10.00    Pack years: 3.00    Types: Cigarettes    Quit date: 09/30/2016    Years since quitting: 4.6   Smokeless tobacco: Never  Vaping Use   Vaping Use: Never used  Substance and Sexual Activity   Alcohol use: No   Drug use: No   Sexual activity: Yes    Birth control/protection: Condom  Other Topics Concern   Not on file  Social History Narrative   Not on file   Social Determinants of Health   Financial Resource Strain: Not on file  Food Insecurity: Not on file  Transportation Needs: Not on file  Physical Activity: Not on file  Stress: Not on file  Social Connections: Not on file  Intimate Partner Violence: Not on file     Allergies  Allergen Reactions   Xarelto [Rivaroxaban]     Bleeding gums, weakness, eye swelling/bluring vision, intensified joint pain     Outpatient Medications Prior to Visit  Medication Sig Dispense Refill   albuterol (VENTOLIN HFA)  108 (90 Base) MCG/ACT inhaler Inhale 2 puffs into the lungs every 6 (six) hours as needed for wheezing or shortness of breath. 18 g 6   diclofenac (VOLTAREN) 75 MG EC tablet TAKE 1 TABLET(75 MG) BY MOUTH TWICE DAILY 60 tablet 2   ibuprofen (ADVIL,MOTRIN) 400 MG tablet Take 800 mg by mouth every 6 (six) hours as needed.      Menthol, Topical Analgesic, (ICY HOT EX) Apply 1 application topically daily as needed (muscle pain).     oxyCODONE-acetaminophen (PERCOCET) 10-325 MG tablet Take 1 tablet by mouth 3 (three) times daily as needed for pain. 90 tablet 0   topiramate (TOPAMAX) 25 MG tablet Take 1 tablet (25 mg total) by mouth at bedtime. 30 tablet 2   triamcinolone (KENALOG) 0.1 % Apply 1 application topically in the morning, at noon, and at bedtime. 30 g 0   No  facility-administered medications prior to visit.    Review of Systems  Constitutional:  Negative for chills, fever, malaise/fatigue and weight loss.  HENT:  Negative for congestion, sinus pain and sore throat.   Eyes: Negative.   Respiratory:  Positive for shortness of breath. Negative for cough, hemoptysis, sputum production and wheezing.   Cardiovascular:  Negative for chest pain, palpitations, orthopnea, claudication and leg swelling.  Gastrointestinal:  Negative for abdominal pain, heartburn, nausea and vomiting.  Genitourinary: Negative.   Musculoskeletal:  Positive for joint pain. Negative for myalgias.  Skin:  Positive for rash.  Neurological:  Negative for weakness.  Endo/Heme/Allergies: Negative.   Psychiatric/Behavioral: Negative.     Objective:   Vitals:   05/10/21 1345  BP: 118/76  Pulse: 62  Temp: 98 F (36.7 C)  TempSrc: Oral  SpO2: 100%  Weight: (!) 561 lb 9.6 oz (254.7 kg)  Height: _0  (1.727 m)     Physical Exam Constitutional:      General: He is not in acute distress.    Appearance: He is obese.  HENT:     Head: Normocephalic and atraumatic.  Eyes:     Extraocular Movements: Extraocular movements intact.     Conjunctiva/sclera: Conjunctivae normal.     Pupils: Pupils are equal, round, and reactive to light.  Cardiovascular:     Rate and Rhythm: Normal rate and regular rhythm.     Pulses: Normal pulses.     Heart sounds: Normal heart sounds. No murmur heard. Pulmonary:     Effort: Pulmonary effort is normal.     Breath sounds: Decreased air movement present. Decreased breath sounds present. No wheezing, rhonchi or rales.  Abdominal:     General: Bowel sounds are normal.     Palpations: Abdomen is soft.  Musculoskeletal:     Right lower leg: No edema.     Left lower leg: No edema.  Lymphadenopathy:     Cervical: No cervical adenopathy.  Skin:    General: Skin is warm and dry.     Findings: Rash present. Rash is scaling (bilateral palms).   Neurological:     General: No focal deficit present.     Mental Status: He is alert.  Psychiatric:        Mood and Affect: Mood normal.        Behavior: Behavior normal.        Thought Content: Thought content normal.        Judgment: Judgment normal.    CBC    Component Value Date/Time   WBC 8.2 05/10/2021 1422   RBC 4.80 05/10/2021 1422  HGB 14.6 05/10/2021 1422   HGB 14.6 02/17/2017 1111   HCT 44.4 05/10/2021 1422   HCT 44.3 02/17/2017 1111   PLT 283.0 05/10/2021 1422   PLT 278 02/17/2017 1111   MCV 92.5 05/10/2021 1422   MCV 88.0 02/17/2017 1111   MCH 29.0 02/17/2017 1111   MCH 29.4 01/17/2017 1025   MCHC 33.0 05/10/2021 1422   RDW 14.5 05/10/2021 1422   RDW 14.1 02/17/2017 1111   LYMPHSABS 2.7 05/10/2021 1422   LYMPHSABS 1.5 02/17/2017 1111   MONOABS 0.7 05/10/2021 1422   MONOABS 1.0 (H) 02/17/2017 1111   EOSABS 0.3 05/10/2021 1422   EOSABS 0.4 02/17/2017 1111   BASOSABS 0.1 05/10/2021 1422   BASOSABS 0.1 02/17/2017 1111   BMP Latest Ref Rng & Units 05/10/2021 02/03/2020 12/22/2019  Glucose 70 - 99 mg/dL 90 102(H) 95  BUN 6 - 23 mg/dL _0 Creatinine 0.40 - 1.50 mg/dL 0.93 0.97 1.06  Sodium 135 - 145 mEq/L 138 140 141  Potassium 3.5 - 5.1 mEq/L 4.0 4.1 3.6  Chloride 96 - 112 mEq/L 103 104 102  CO2 19 - 32 mEq/L _1 Calcium 8.4 - 10.5 mg/dL 9.0 9.3 9.3   Chest imaging:  PFT: PFT Results Latest Ref Rng & Units 09/22/2017  FVC-Pre L 3.98  FVC-Predicted Pre % 95  FVC-Post L 3.68  FVC-Predicted Post % 88  Pre FEV1/FVC % % 84  Post FEV1/FCV % % 84  FEV1-Pre L 3.36  FEV1-Predicted Pre % 97  FEV1-Post L 3.10  DLCO uncorrected ml/min/mmHg 51.72  DLCO UNC% % 174  DLVA Predicted % 159   Labs:  Path:  Echo:  Heart Catheterization:  Assessment & Plan:   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   He appears to have return/progression of his sarcoidosis since being off prednisone and methotrexate in 07/2020. 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  He is scheduled for CT Chest on 05/25/20 and follow up with Dr. Lamonte Sakai in February 2023.   We will start him back on prednisone 56m daily and determine plan for resuming methotrexate with goal of 149mweekly or consider alternative agent. He may benefit from referral to rheumatology. Will discuss further with Dr. ByLamonte Sakai  Follow up as arranged previously.   JoFreda JacksonMD [facility] Office: [phone] Current Outpatient Medications:    albuterol (VENTOLIN HFA) 108 (90 Base) MCG/ACT inhaler, Inhale 2 puffs into the lungs every 6 (six) hours as needed for wheezing or shortness of breath., Disp: 18 g, Rfl: 6   diclofenac (VOLTAREN) 75 MG EC tablet, TAKE 1 TABLET(75 MG) BY MOUTH TWICE DAILY, Disp: 60 tablet, Rfl: 2   ibuprofen (ADVIL,MOTRIN) 400 MG tablet, Take 800 mg by mouth every 6 (six) hours as needed. , Disp: , Rfl:    Menthol, Topical Analgesic, (ICY HOT EX), Apply 1 application topically daily as needed (muscle pain)., Disp: , Rfl:    oxyCODONE-acetaminophen (PERCOCET) 10-325 MG tablet, Take 1 tablet by mouth 3 (three) times daily as needed for pain., Disp: 90 tablet, Rfl: 0   predniSONE (DELTASONE) 10 MG tablet, Take 3 tablets (30 mg  total) by mouth daily with breakfast., Disp: 90 tablet, Rfl: 1   ibuprofen (ADVIL) 800 MG tablet, Take 800 mg by mouth every 6 (six) hours as needed., Disp: , Rfl:

## 2021-05-11 ENCOUNTER — Encounter: Payer: Self-pay | Admitting: Registered Nurse

## 2021-05-11 ENCOUNTER — Other Ambulatory Visit: Payer: Self-pay

## 2021-05-11 ENCOUNTER — Encounter: Payer: 59 | Attending: Physical Medicine and Rehabilitation | Admitting: Registered Nurse

## 2021-05-11 VITALS — BP 117/77 | HR 64 | Temp 99.6°F | Ht 68.0 in | Wt >= 6400 oz

## 2021-05-11 DIAGNOSIS — G894 Chronic pain syndrome: Secondary | ICD-10-CM | POA: Diagnosis present

## 2021-05-11 DIAGNOSIS — M792 Neuralgia and neuritis, unspecified: Secondary | ICD-10-CM | POA: Insufficient documentation

## 2021-05-11 DIAGNOSIS — M546 Pain in thoracic spine: Secondary | ICD-10-CM | POA: Diagnosis not present

## 2021-05-11 DIAGNOSIS — M542 Cervicalgia: Secondary | ICD-10-CM | POA: Diagnosis not present

## 2021-05-11 DIAGNOSIS — M48062 Spinal stenosis, lumbar region with neurogenic claudication: Secondary | ICD-10-CM | POA: Insufficient documentation

## 2021-05-11 DIAGNOSIS — M25562 Pain in left knee: Secondary | ICD-10-CM | POA: Insufficient documentation

## 2021-05-11 DIAGNOSIS — Z5181 Encounter for therapeutic drug level monitoring: Secondary | ICD-10-CM

## 2021-05-11 DIAGNOSIS — G8929 Other chronic pain: Secondary | ICD-10-CM | POA: Insufficient documentation

## 2021-05-11 DIAGNOSIS — Z79891 Long term (current) use of opiate analgesic: Secondary | ICD-10-CM | POA: Diagnosis present

## 2021-05-11 DIAGNOSIS — M25511 Pain in right shoulder: Secondary | ICD-10-CM | POA: Diagnosis not present

## 2021-05-11 DIAGNOSIS — M25561 Pain in right knee: Secondary | ICD-10-CM

## 2021-05-11 MED ORDER — OXYCODONE-ACETAMINOPHEN 10-325 MG PO TABS: 1.0000 | ORAL_TABLET | Freq: Three times a day (TID) | ORAL | 0 refills | Status: DC | PRN

## 2021-05-11 NOTE — Telephone Encounter (Signed)
Recivied fax stating appeal had been reviewed and bi-level had been approved. Placed fax in Dr. Delton Coombes review folder. Nothing further needed at this time.

## 2021-05-11 NOTE — Progress Notes (Signed)
Subjective:    Patient ID: Anthony [name], male    DOB: [birth year], 40 y.o.   MRN: [id number]  HPI: Anthony [name] is a 40 y.o. male who returns for follow up appointment for chronic pain and medication refill. He states his pain is located in his neck, upper back pain mainly right side and lower back pain , he reports occasionally his lower back pain radiates into his right hip pain. He also reports tingling and numbness in his ring and pinky finger . He rates his pain 6. His current exercise regime is walking and performing stretching exercises.  Mr. Schreur Morphine equivalent is 45.00 MME.   Last UDS was Performed on 01/11/2021, it was consistent.     Pain Inventory Average Pain 7 Pain Right Now 6 My pain is constant, sharp, tingling, and aching  In the last 24 hours, has pain interfered with the following? General activity 10 Relation with others 10 Enjoyment of life 10 What TIME of day is your pain at its worst? morning , daytime, evening, and night Sleep (in general) Poor  Pain is worse with: walking, sitting, and standing Pain improves with: rest, medication, and stretching Relief from Meds: 7  Family History  Problem Relation Age of Onset   Hypertension Father    Diabetes Father    Stroke Father    Ovarian cancer Maternal Grandmother 33   Lymphoma Paternal Uncle    Social History   Socioeconomic History   Marital status: Married    Spouse name: Not on file   Number of children: Not on file   Years of education: Not on file   Highest education level: Not on file  Occupational History   Occupation: Disabled  Tobacco Use   Smoking status: Former    Packs/day: 0.30    Years: 10.00    Pack years: 3.00    Types: Cigarettes    Quit date: 09/30/2016    Years since quitting: 4.6   Smokeless tobacco: Never  Vaping Use   Vaping Use: Never used  Substance and Sexual Activity   Alcohol use: No   Drug use: No   Sexual activity: Yes    Birth control/protection:  Condom  Other Topics Concern   Not on file  Social History Narrative   Not on file   Social Determinants of Health   Financial Resource Strain: Not on file  Food Insecurity: Not on file  Transportation Needs: Not on file  Physical Activity: Not on file  Stress: Not on file  Social Connections: Not on file   Past Surgical History:  Procedure Laterality Date   ARTHROSCOPY KNEE W/ DRILLING     right knee    ENDOBRONCHIAL ULTRASOUND Bilateral 03/04/2017   Procedure: ENDOBRONCHIAL ULTRASOUND;  Surgeon: Leslye Peer, MD;  Location: [facility];  Service: Cardiopulmonary;  Laterality: Bilateral;   left knee inner growth plate removed     to straighten leg   Past Surgical History:  Procedure Laterality Date   ARTHROSCOPY KNEE W/ DRILLING     right knee    ENDOBRONCHIAL ULTRASOUND Bilateral 03/04/2017   Procedure: ENDOBRONCHIAL ULTRASOUND;  Surgeon: Leslye Peer, MD;  Location: [facility];  Service: Cardiopulmonary;  Laterality: Bilateral;   left knee inner growth plate removed     to straighten leg   Past Medical History:  Diagnosis Date   Anxiety    Depression    Dyspnea    Eczema of hand    Headache  hx of with tooth pain   Obese    Pneumonia    12/2016   Pre-diabetes    Sarcoidosis    Sleep apnea    cpap   Ht 5\' 8"  (1.727 m)    Wt (!) 562 lb 12.8 oz (255.3 kg)    BMI 85.57 kg/m   Opioid Risk Score:   Fall Risk Score:  `1  Depression screen PHQ 2/9  Depression screen [facility] 2/9 05/11/2021 03/06/2021 02/09/2021 01/11/2021 06/24/2018 02/03/2017 01/17/2017  Decreased Interest 0 1 1 1  0 1 0  Down, Depressed, Hopeless 1 1 1 1  0 1 0  PHQ - 2 Score 1 2 2 2  0 2 0  Altered sleeping - - - - - 2 -  Tired, decreased energy - - - - - 1 -  Change in appetite - - - - - 1 -  Feeling bad or failure about yourself  - - - - - 1 -  Trouble concentrating - - - - - 0 -  Moving slowly or fidgety/restless - - - - - 1 -  Suicidal thoughts - - - - - 0 -  PHQ-9 Score - - - - - 8 -      Review of Systems  Constitutional: Negative.   HENT: Negative.    Eyes: Negative.   Respiratory: Negative.    Gastrointestinal: Negative.   Endocrine: Negative.   Genitourinary: Negative.   Musculoskeletal:  Positive for back pain and neck pain.  Skin: Negative.   Allergic/Immunologic: Negative.   Neurological:  Positive for weakness and numbness.  Hematological: Negative.   Psychiatric/Behavioral:  Positive for dysphoric mood and sleep disturbance.       Objective:   Physical Exam Vitals and nursing note reviewed.  Constitutional:      Appearance: Normal appearance.  Neck:     Comments: Cervical Paraspinal Tenderness: C-5-C-6 Cardiovascular:     Rate and Rhythm: Normal rate and regular rhythm.  Pulmonary:     Effort: Pulmonary effort is normal.     Breath sounds: Normal breath sounds.  Musculoskeletal:     Cervical back: Normal range of motion and neck supple.     Comments: Normal Muscle Bulk and Muscle Testing Reveals:  Upper Extremities: Full ROM and Muscle Strength  5/5 Thoracic Paraspinal Tenderness: T-4-T-6 Mainly Right Side  Lumbar Paraspinal Tenderness: L-3-L-5 Lower Extremities: Decreased ROM and Muscle Strength 5/5 Arises from Table Slowly  Antalgic  Gait     Skin:    General: Skin is warm and dry.  Neurological:     Mental Status: He is alert and oriented to person, place, and time.  Psychiatric:        Mood and Affect: Mood normal.        Behavior: Behavior normal.         Assessment & Plan:  1.Right Lumbar Radiculitis/ Spinal Stenosis : Continue HEP as Tolerated and Continue to Monitor. 05/11/2021 Refilled: Oxycodone 10/325 mg one tablet three times a day as needed for pain #90. We will continue the opioid monitoring program, this consists of regular clinic visits, examinations, urine drug screen, pill counts as well as use of Controlled Substance Reporting system. A 12 month History has been reviewed on the  Controlled Substance Reporting System on 05/11/2021.  2. Thoracic Back Pain: Continue HEP as Tolerated . Continue to Monitor. 05/11/2021 3. Right Shoulder Pain: Continue HEP as Tolerated and Continue to Monitor.05/11/2021 4. Chronic Pain Syndrome: Continue HEP as Tolerated.  Continue to Monitor. 05/11/2021 5. Morbid Obesity: Continue Healthy Diet Regimen. Continue to Monitor. 05/11/2021 6. Chronic Bilateral Knee Pain: Continue HEP as Tolerated. Continue to Monitor. 05/11/2021 7. Cervicalgia: RX: Xray. Continue to monitor.    F/U in 1 month

## 2021-05-15 NOTE — Telephone Encounter (Signed)
Thank you :)

## 2021-05-25 ENCOUNTER — Inpatient Hospital Stay: Admission: RE | Admit: 2021-05-25 | Payer: Self-pay | Source: Ambulatory Visit

## 2021-06-07 ENCOUNTER — Other Ambulatory Visit: Payer: Self-pay

## 2021-06-07 ENCOUNTER — Encounter: Payer: Self-pay | Admitting: Registered Nurse

## 2021-06-07 ENCOUNTER — Encounter: Payer: Managed Care, Other (non HMO) | Attending: Physical Medicine and Rehabilitation | Admitting: Registered Nurse

## 2021-06-07 VITALS — BP 125/82 | HR 78 | Temp 98.1°F | Ht 68.0 in | Wt >= 6400 oz

## 2021-06-07 DIAGNOSIS — M546 Pain in thoracic spine: Secondary | ICD-10-CM | POA: Diagnosis present

## 2021-06-07 DIAGNOSIS — M48062 Spinal stenosis, lumbar region with neurogenic claudication: Secondary | ICD-10-CM | POA: Insufficient documentation

## 2021-06-07 DIAGNOSIS — G894 Chronic pain syndrome: Secondary | ICD-10-CM | POA: Diagnosis not present

## 2021-06-07 DIAGNOSIS — G8929 Other chronic pain: Secondary | ICD-10-CM | POA: Diagnosis present

## 2021-06-07 DIAGNOSIS — Z5181 Encounter for therapeutic drug level monitoring: Secondary | ICD-10-CM | POA: Diagnosis not present

## 2021-06-07 DIAGNOSIS — Z79891 Long term (current) use of opiate analgesic: Secondary | ICD-10-CM | POA: Diagnosis not present

## 2021-06-07 MED ORDER — OXYCODONE-ACETAMINOPHEN 10-325 MG PO TABS: 1.0000 | ORAL_TABLET | Freq: Four times a day (QID) | ORAL | 0 refills | Status: DC | PRN

## 2021-06-07 NOTE — Progress Notes (Signed)
Subjective:    Patient ID: Anthony [name], male    DOB: 02-Mar-1981, 41 y.o.   MRN: [id number]  HPI: Anthony [name] is a 41 y.o. male who returns for follow up appointment for chronic pain and medication refill. He states his pain is located in his mid- lower back, he also reports he is only receiving 2- 4 hours of relief with his current medication regimen. We will increase his Oxycodone frequency, he verbalizes understanding.  He rates his pain 6. His current exercise regime is walking and performing stretching  Mr. Mccarley Morphine equivalent is 45.00 MME.   UDS ordered today.     Pain Inventory Average Pain 7 Pain Right Now 6 My pain is sharp, burning, dull, tingling, and aching  In the last 24 hours, has pain interfered with the following? General activity 10 Relation with others 10 Enjoyment of life 10 What TIME of day is your pain at its worst? morning  Sleep (in general) Fair  Pain is worse with: walking, bending, sitting, inactivity, and standing Pain improves with: rest and medication Relief from Meds: 5  Family History  Problem Relation Age of Onset   Hypertension Father    Diabetes Father    Stroke Father    Ovarian cancer Maternal Grandmother 16   Lymphoma Paternal Uncle    Social History   Socioeconomic History   Marital status: Married    Spouse name: Not on file   Number of children: Not on file   Years of education: Not on file   Highest education level: Not on file  Occupational History   Occupation: Disabled  Tobacco Use   Smoking status: Former    Packs/day: 0.30    Years: 10.00    Pack years: 3.00    Types: Cigarettes    Quit date: 09/30/2016    Years since quitting: 4.6   Smokeless tobacco: Never  Vaping Use   Vaping Use: Never used  Substance and Sexual Activity   Alcohol use: No   Drug use: No   Sexual activity: Yes    Birth control/protection: Condom  Other Topics Concern   Not on file  Social History Narrative   Not on file    Social Determinants of Health   Financial Resource Strain: Not on file  Food Insecurity: Not on file  Transportation Needs: Not on file  Physical Activity: Not on file  Stress: Not on file  Social Connections: Not on file   Past Surgical History:  Procedure Laterality Date   ARTHROSCOPY KNEE W/ DRILLING     right knee    ENDOBRONCHIAL ULTRASOUND Bilateral 03/04/2017   Procedure: ENDOBRONCHIAL ULTRASOUND;  Surgeon: Leslye Peer, MD;  Location: [facility];  Service: Cardiopulmonary;  Laterality: Bilateral;   left knee inner growth plate removed     to straighten leg   Past Surgical History:  Procedure Laterality Date   ARTHROSCOPY KNEE W/ DRILLING     right knee    ENDOBRONCHIAL ULTRASOUND Bilateral 03/04/2017   Procedure: ENDOBRONCHIAL ULTRASOUND;  Surgeon: Leslye Peer, MD;  Location: [facility];  Service: Cardiopulmonary;  Laterality: Bilateral;   left knee inner growth plate removed     to straighten leg   Past Medical History:  Diagnosis Date   Anxiety    Depression    Dyspnea    Eczema of hand    Headache    hx of with tooth pain   Obese    Pneumonia    12/2016  Pre-diabetes    Sarcoidosis    Sleep apnea    cpap   BP 125/82    Pulse 78    Temp 98.1 F (36.7 C)    Ht 5\' 8"  (1.727 m)    Wt (!) 570 lb 6.4 oz (258.7 kg)    SpO2 97%    BMI 86.73 kg/m   Opioid Risk Score:   Fall Risk Score:  `1  Depression screen PHQ 2/9  Depression screen [facility] 2/9 06/07/2021 05/11/2021 03/06/2021 02/09/2021 01/11/2021 06/24/2018 02/03/2017  Decreased Interest 1 0 1 1 1  0 1  Down, Depressed, Hopeless 1 1 1 1 1  0 1  PHQ - 2 Score 2 1 2 2 2  0 2  Altered sleeping - - - - - - 2  Tired, decreased energy - - - - - - 1  Change in appetite - - - - - - 1  Feeling bad or failure about yourself  - - - - - - 1  Trouble concentrating - - - - - - 0  Moving slowly or fidgety/restless - - - - - - 1  Suicidal thoughts - - - - - - 0  PHQ-9 Score - - - - - - 8     Review of Systems   Constitutional: Negative.   HENT: Negative.    Eyes: Negative.   Respiratory: Negative.    Cardiovascular: Negative.   Gastrointestinal: Negative.   Endocrine: Negative.   Genitourinary: Negative.   Musculoskeletal:  Positive for back pain, neck pain and neck stiffness.  Skin: Negative.   Allergic/Immunologic: Negative.   Neurological:  Positive for numbness.  Hematological: Negative.   Psychiatric/Behavioral:  Positive for dysphoric mood and sleep disturbance.       Objective:   Physical Exam Vitals and nursing note reviewed.  Constitutional:      Appearance: Normal appearance. He is obese.  Cardiovascular:     Rate and Rhythm: Normal rate and regular rhythm.     Pulses: Normal pulses.     Heart sounds: Normal heart sounds.  Pulmonary:     Effort: Pulmonary effort is normal.     Breath sounds: Normal breath sounds.  Musculoskeletal:     Cervical back: Normal range of motion and neck supple.     Comments: Normal Muscle Bulk and Muscle Testing Reveals:  Upper Extremities: Full ROM and Muscle Strength 5/5  Thoracic Paraspinal Tenderness: T-2-T-6 Lower Extremities: Decreased ROM and Muscle Strength 5/5 Arise from Table Slowly Narrow Based  Gait     Skin:    General: Skin is warm and dry.  Neurological:     Mental Status: He is alert and oriented to person, place, and time.  Psychiatric:        Mood and Affect: Mood normal.        Behavior: Behavior normal.         Assessment & Plan:  1.Right Lumbar Radiculitis/ Spinal Stenosis : Continue HEP as Tolerated and Continue to Monitor. 06/07/2021 Refilled: Increased :Oxycodone 10/325 mg one tablet 4 times a day as needed for pain #120. We will continue the opioid monitoring program, this consists of regular clinic visits, examinations, urine drug screen, pill counts as well as use of [place] Controlled Substance Reporting system. A 12 month History has been reviewed on the [place] Controlled Substance  Reporting System on 06/07/2021.  2. Thoracic Back Pain: Continue HEP as Tolerated . Continue to Monitor. 06/07/2021 3. Right Shoulder Pain: Continue HEP as Tolerated and  Continue to Monitor.06/07/2021 4. Chronic Pain Syndrome: Continue HEP as Tolerated. Continue to Monitor. 06/07/2021 5. Morbid Obesity: Continue Healthy Diet Regimen. Continue to Monitor. 0126/2023 6. Chronic Bilateral Knee Pain: No complaints today. Continue HEP as Tolerated. Continue to Monitor. 06/07/2021     F/U in 1 month

## 2021-06-11 ENCOUNTER — Telehealth: Payer: Self-pay

## 2021-06-11 NOTE — Telephone Encounter (Signed)
PA created  in Cover My Meds for Oxycodone Acetaminophen 10-325 MG on 06/11/2021.

## 2021-06-14 ENCOUNTER — Telehealth: Payer: Self-pay | Admitting: *Deleted

## 2021-06-14 LAB — TOXASSURE SELECT,+ANTIDEPR,UR

## 2021-06-14 NOTE — Telephone Encounter (Signed)
Urine drug screen for this encounter is consistent for prescribed medication. It is also positive for marijuana.

## 2021-06-14 NOTE — Telephone Encounter (Signed)
Formal warning letter sent through MyChart and [employer]PS. 

## 2021-06-15 ENCOUNTER — Ambulatory Visit (HOSPITAL_COMMUNITY)
Admission: EM | Admit: 2021-06-15 | Discharge: 2021-06-15 | Disposition: A | Discharge status: Home / Self Care | Payer: Managed Care, Other (non HMO)

## 2021-06-15 ENCOUNTER — Other Ambulatory Visit: Payer: Self-pay

## 2021-06-15 ENCOUNTER — Encounter (HOSPITAL_COMMUNITY): Payer: Self-pay | Admitting: Emergency Medicine

## 2021-06-15 DIAGNOSIS — I83018 Varicose veins of right lower extremity with ulcer other part of lower leg: Secondary | ICD-10-CM

## 2021-06-15 DIAGNOSIS — L97811 Non-pressure chronic ulcer of other part of right lower leg limited to breakdown of skin: Secondary | ICD-10-CM

## 2021-06-15 NOTE — Discharge Instructions (Signed)
I have initiated a request to assist you in finding a primary care provider.  I have also included contact information for [facility], please give them a call Monday morning to try to set up an appointment for soon as possible.

## 2021-06-15 NOTE — ED Triage Notes (Signed)
Pt presents with c/o wound on right lower leg. Pt states the wound has been present for 6 months and won't heal. Pt states he noticed a foul odor and green/brown discharge from the wound lastnight. Denies fever and any other symptoms at this time.

## 2021-06-15 NOTE — ED Provider Notes (Signed)
[facility]    CSN: [id number] Arrival date & time: 06/15/21  1749    HISTORY   Chief Complaint  Patient presents with   Wound Check   HPI Anthony [name] is a 41 y.o. male. Pt presents with c/o wound on right lower leg. Pt states the wound has been present for 6 months and won't heal. Pt states he noticed a foul odor and green/brown discharge from the wound last week.  Denies fever and any other symptoms at this time, states wound looks very good at this time but adds that he fully anticipates that will get worse again in a week or 2, states his wife wanted him to come in and have it evaluated, patient states he does not have a primary care provider.   The history is provided by the patient.  Past Medical History:  Diagnosis Date   Anxiety    Depression    Dyspnea    Eczema of hand    Headache    hx of with tooth pain   Obese    Pneumonia    12/2016   Pre-diabetes    Sarcoidosis    Sleep apnea    cpap   Patient Active Problem List   Diagnosis Date Noted   Chest discomfort 12/22/2019   Well adult exam 06/24/2018   Primary osteoarthritis of right knee 06/24/2018   Eczema 06/24/2018   Testicular swelling 06/10/2018   Chronic pain 12/05/2017   Morbid obesity ([place]) 09/22/2017   Obstructive sleep apnea 05/19/2017   Constipation 04/10/2017   Mediastinal lymphadenopathy 03/04/2017   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[place]) 02/24/2017   Abnormal chest x-ray with multiple lung nodules 02/08/2017   Reactive depression 01/16/2017   Anticoagulated by anticoagulation treatment 01/16/2017   Past Surgical History:  Procedure Laterality Date   ARTHROSCOPY KNEE W/ DRILLING     right knee    ENDOBRONCHIAL ULTRASOUND Bilateral 03/04/2017   Procedure: ENDOBRONCHIAL ULTRASOUND;  Surgeon: Collene Gobble, MD;  Location: [facility];  Service: Cardiopulmonary;  Laterality: Bilateral;   left knee inner growth plate removed     to straighten leg     Home Medications    Prior to Admission medications   Medication Sig Start Date End Date Taking? Authorizing Provider  albuterol (VENTOLIN HFA) 108 (90 Base) MCG/ACT inhaler Inhale 2 puffs into the lungs every 6 (six) hours as needed for wheezing or shortness of breath. 12/24/19   Collene Gobble, MD  diclofenac (VOLTAREN) 75 MG EC tablet TAKE 1 TABLET(75 MG) BY MOUTH TWICE DAILY 03/26/21   Raulkar, Clide Deutscher, MD  ibuprofen (ADVIL) 800 MG tablet Take 800 mg by mouth every 6 (six) hours as needed. 05/08/21   [provider]  ibuprofen (ADVIL,MOTRIN) 400 MG tablet Take 800 mg by mouth every 6 (six) hours as needed.     [provider]  Menthol, Topical Analgesic, (ICY HOT EX) Apply 1 application topically daily as needed (muscle pain).    [provider]  oxyCODONE-acetaminophen (PERCOCET) 10-325 MG tablet Take 1 tablet by mouth 4 (four) times daily as needed for pain. 06/07/21 06/07/22  Bayard Hugger, NP  predniSONE (DELTASONE) 10 MG tablet Take 3 tablets (30 mg total) by mouth daily with breakfast. 05/10/21   Freddi Starr, MD    Family History Family History  Problem Relation Age of Onset   Hypertension Father    Diabetes Father    Stroke Father    Ovarian  cancer Maternal Grandmother 60   Lymphoma Paternal Uncle    Social History Social History   Tobacco Use   Smoking status: Former    Packs/day: 0.30    Years: 10.00    Pack years: 3.00    Types: Cigarettes    Quit date: 09/30/2016    Years since quitting: 4.7   Smokeless tobacco: Never  Vaping Use   Vaping Use: Never used  Substance Use Topics   Alcohol use: No   Drug use: No   Allergies   Xarelto [rivaroxaban]  Review of Systems Review of Systems Pertinent findings noted in history of present illness.   Physical Exam Triage Vital Signs ED Triage Vitals  Enc Vitals Group     BP 03/09/21 0827 (!) 147/82     Pulse Rate 03/09/21 0827 72     Resp 03/09/21 0827 18     Temp  03/09/21 0827 98.3 F (36.8 C)     Temp Source 03/09/21 0827 Oral     SpO2 03/09/21 0827 98 %     Weight --      Height --      Head Circumference --      Peak Flow --      Pain Score 03/09/21 0826 5     Pain Loc --      Pain Edu? --      Excl. in [place]? --   No data found.  Updated Vital Signs BP (!) 142/69 (BP Location: Right Arm)    Pulse 71    Temp 98.4 F (36.9 C) (Oral)    Resp 20    Ht 5\' 8"  (1.727 m)    Wt (!) 570 lb 6.4 oz (258.7 kg)    SpO2 99%    BMI 86.73 kg/m   Physical Exam Constitutional:      Appearance: He is morbidly obese.  Skin:    Comments: Venous stasis in bilateral lower extremities with significant dependent edema.  There are 3 partially healed chronic venous stasis lesions posterior aspect of the right lower extremity that are not currently draining, surrounded by erythema and are not indurated.  There is no foul odor appreciated from the wounds.  Neurological:     Mental Status: He is alert.  Psychiatric:        Behavior: Behavior is cooperative.    Visual Acuity Right Eye Distance:   Left Eye Distance:   Bilateral Distance:    Right Eye Near:   Left Eye Near:    Bilateral Near:     UC Couse / Diagnostics / Procedures:    EKG  Radiology No results found.  Procedures Procedures (including critical care time)  UC Diagnoses / Final Clinical Impressions(s)   I have reviewed the triage vital signs and the nursing notes.  Pertinent labs & imaging results that were available during my care of the patient were reviewed by me and considered in my medical decision making (see chart for details).    Final diagnoses:  Venous stasis ulcer of other part of right lower leg limited to breakdown of skin, unspecified whether varicose veins present [facility])   Patient advised that we will assist him in finding a primary care provider at this time.  Patient also provided with contact information for the [facility].  Patient  advised to reach out to them first thing Monday morning to attempt to schedule appointment.  ED Prescriptions   None    PDMP not  reviewed this encounter.  Pending results:  Labs Reviewed - No data to display  Medications Ordered in UC: Medications - No data to display  Disposition Upon Discharge:  Condition: stable for discharge home Home: take medications as prescribed; routine discharge instructions as discussed; follow up as advised.  Patient presented with an acute illness with associated systemic symptoms and significant discomfort requiring urgent management. In my opinion, this is a condition that a prudent lay person (someone who possesses an average knowledge of health and medicine) may potentially expect to result in complications if not addressed urgently such as respiratory distress, impairment of bodily function or dysfunction of bodily organs.   Routine symptom specific, illness specific and/or disease specific instructions were discussed with the patient and/or caregiver at length.   As such, the patient has been evaluated and assessed, work-up was performed and treatment was provided in alignment with urgent care protocols and evidence based medicine.  Patient/parent/caregiver has been advised that the patient may require follow up for further testing and treatment if the symptoms continue in spite of treatment, as clinically indicated and appropriate.  If the patient was tested for COVID-19, Influenza and/or RSV, then the patient/parent/guardian was advised to isolate at home pending the results of his/her diagnostic coronavirus test and potentially longer if theyre positive. I have also advised pt that if his/her COVID-19 test returns positive, it's recommended to self-isolate for at least 10 days after symptoms first appeared AND until fever-free for 24 hours without fever reducer AND other symptoms have improved or resolved. Discussed self-isolation recommendations as  well as instructions for household member/close contacts as per the [facility] and [place] [facility], and also gave patient the [facility] packet with this information.  Patient/parent/caregiver has been advised to return to the [facility] or PCP in 3-5 days if no better; to PCP or the Emergency Department if new signs and symptoms develop, or if the current signs or symptoms continue to change or worsen for further workup, evaluation and treatment as clinically indicated and appropriate  The patient will follow up with their current PCP if and as advised. If the patient does not currently have a PCP we will assist them in obtaining one.   The patient may need specialty follow up if the symptoms continue, in spite of conservative treatment and management, for further workup, evaluation, consultation and treatment as clinically indicated and appropriate.   Patient/parent/caregiver verbalized understanding and agreement of plan as discussed.  All questions were addressed during visit.  Please see discharge instructions below for further details of plan.  Discharge Instructions:   Discharge Instructions      I have initiated a request to assist you in finding a primary care provider.  I have also included contact information for [facility], please give them a call Monday morning to try to set up an appointment for soon as possible.    This office note has been dictated using Museum/gallery curator.  Unfortunately, and despite my best efforts, this method of dictation can sometimes lead to occasional typographical or grammatical errors.  I apologize in advance if this occurs.     Lynden Oxford Scales, [place] 06/15/21 1932

## 2021-06-19 ENCOUNTER — Other Ambulatory Visit: Payer: Self-pay

## 2021-06-19 ENCOUNTER — Emergency Department (HOSPITAL_BASED_OUTPATIENT_CLINIC_OR_DEPARTMENT_OTHER)
Admission: EM | Admit: 2021-06-19 | Discharge: 2021-06-19 | Disposition: A | Discharge status: Home / Self Care | Payer: Commercial Managed Care - HMO | Attending: Emergency Medicine | Admitting: Emergency Medicine

## 2021-06-19 DIAGNOSIS — Z87891 Personal history of nicotine dependence: Secondary | ICD-10-CM | POA: Diagnosis not present

## 2021-06-19 DIAGNOSIS — Z79899 Other long term (current) drug therapy: Secondary | ICD-10-CM | POA: Insufficient documentation

## 2021-06-19 DIAGNOSIS — K029 Dental caries, unspecified: Secondary | ICD-10-CM

## 2021-06-19 DIAGNOSIS — K0889 Other specified disorders of teeth and supporting structures: Secondary | ICD-10-CM | POA: Diagnosis present

## 2021-06-19 DIAGNOSIS — K047 Periapical abscess without sinus: Secondary | ICD-10-CM | POA: Insufficient documentation

## 2021-06-19 MED ORDER — AMOXICILLIN 500 MG PO CAPS
1000.0000 mg | ORAL_CAPSULE | Freq: Once | ORAL | Status: AC
Start: 2021-06-19 — End: 2021-06-19
  Administered 2021-06-19: 1000 mg via ORAL
  Filled 2021-06-19: qty 2

## 2021-06-19 MED ORDER — AMOXICILLIN 500 MG PO CAPS: 500.0000 mg | ORAL_CAPSULE | Freq: Three times a day (TID) | ORAL | 0 refills | Status: DC

## 2021-06-19 MED ORDER — BUPIVACAINE-EPINEPHRINE (PF) 0.5% -1:200000 IJ SOLN
1.8000 mL | Freq: Once | INTRAMUSCULAR | Status: AC
  Administered 2021-06-19: 1.8 mL
  Filled 2021-06-19: qty 1.8

## 2021-06-19 NOTE — ED Provider Notes (Signed)
[facility] Provider Note: Lowella Dell, MD, FACEP  CSN: [id number] MRN: [id number] ARRIVAL: 06/19/21 at 0137 ROOM: DB015/DB015   CHIEF COMPLAINT  Dental Pain   HISTORY OF PRESENT ILLNESS  06/19/21 2:04 AM Anthony [name] is a 41 y.o. male with 4 days of pain associated with a carious left lower first molar.  There is swelling adjacent to that tooth.  He missed a dental appointment 2 weeks ago and did not make a follow-up appointment.  He is on chronic Percocet due to knee and back pain.  His most recent Percocet prescription was for 120, 10/325  tablets.  He denies fever or chills.   Past Medical History:  Diagnosis Date   Anxiety    Depression    Dyspnea    Eczema of hand    Headache    hx of with tooth pain   Obese    Pneumonia    12/2016   Pre-diabetes    Sarcoidosis    Sleep apnea    cpap    Past Surgical History:  Procedure Laterality Date   ARTHROSCOPY KNEE W/ DRILLING     right knee    ENDOBRONCHIAL ULTRASOUND Bilateral 03/04/2017   Procedure: ENDOBRONCHIAL ULTRASOUND;  Surgeon: Leslye Peer, MD;  Location: [facility];  Service: Cardiopulmonary;  Laterality: Bilateral;   left knee inner growth plate removed     to straighten leg    Family History  Problem Relation Age of Onset   Hypertension Father    Diabetes Father    Stroke Father    Ovarian cancer Maternal Grandmother 60   Lymphoma Paternal Uncle     Social History   Tobacco Use   Smoking status: Former    Packs/day: 0.30    Years: 10.00    Pack years: 3.00    Types: Cigarettes    Quit date: 09/30/2016    Years since quitting: 4.7   Smokeless tobacco: Never  Vaping Use   Vaping Use: Never used  Substance Use Topics   Alcohol use: No   Drug use: No    Prior to Admission medications   Medication Sig Start Date End Date Taking? Authorizing Provider  amoxicillin (AMOXIL) 500 MG capsule Take 1 capsule (500 mg total) by mouth 3 (three) times daily. 06/19/21  Yes Romana Deaton,  MD  albuterol (VENTOLIN HFA) 108 (90 Base) MCG/ACT inhaler Inhale 2 puffs into the lungs every 6 (six) hours as needed for wheezing or shortness of breath. 12/24/19   Leslye Peer, MD  diclofenac (VOLTAREN) 75 MG EC tablet TAKE 1 TABLET(75 MG) BY MOUTH TWICE DAILY 03/26/21   Raulkar, Drema Pry, MD  Menthol, Topical Analgesic, (ICY HOT EX) Apply 1 application topically daily as needed (muscle pain).    [provider]  oxyCODONE-acetaminophen (PERCOCET) 10-325 MG tablet Take 1 tablet by mouth 4 (four) times daily as needed for pain. 06/07/21 06/07/22  Jones Bales, NP    Allergies Xarelto [rivaroxaban]   REVIEW OF SYSTEMS  Negative except as noted here or in the History of Present Illness.   PHYSICAL EXAMINATION  Initial Vital Signs Blood pressure (!) 169/96, pulse 82, temperature 98.9 F (37.2 C), temperature source Oral, resp. rate 18, height 5\' 8"  (1.727 m), weight (!) 252.2 kg, SpO2 96 %.  Examination General: Well-developed, high BMI male in no acute distress; appearance consistent with age of record HENT: normocephalic; atraumatic; left lower first molar carious to the gumline with adjacent soft tissue swelling consistent with  an abscess Eyes: Normal appearance Neck: supple Heart: regular rate and rhythm Lungs: clear to auscultation bilaterally Abdomen: soft; nondistended; nontender; bowel sounds present Extremities: No deformity; full range of motion Neurologic: Awake, alert and oriented; motor function intact in all extremities and symmetric; no facial droop Skin: Warm and dry Psychiatric: Normal mood and affect   RESULTS  Summary of this visit's results, reviewed and interpreted by myself:   EKG Interpretation  Date/Time:    Ventricular Rate:    PR Interval:    QRS Duration:   QT Interval:    QTC Calculation:   R Axis:     Text Interpretation:         Laboratory Studies: No results found for this or any previous visit (from the past 24  hour(s)). Imaging Studies: No results found.  ED COURSE and MDM  Nursing notes, initial and subsequent vitals signs, including pulse oximetry, reviewed and interpreted by myself.  Vitals:   06/19/21 0156  BP: (!) 169/96  Pulse: 82  Resp: 18  Temp: 98.9 F (37.2 C)  TempSrc: Oral  SpO2: 96%  Weight: (!) 252.2 kg  Height: 5\' 8"  (1.727 m)   Medications  bupivacaine-epinephrine (PF) (MARCAINE W/ EPI) 0.5% -1:200000 injection 1.8 mL (1.8 mLs Infiltration Given 06/19/21 0214)  amoxicillin (AMOXIL) capsule 1,000 mg (1,000 mg Oral Given 06/19/21 0215)   We will start the patient on amoxicillin and refer him to the oral surgeon on-call.   PROCEDURES  Procedures  DENTAL BLOCK 1.8 milliliters of 0.5% bupivacaine with epinephrine were injected into the buccal fold adjacent to the left lower first molar. The patient tolerated this well and there were no immediate complications. Adequate analgesia was obtained.  ED DIAGNOSES     ICD-10-CM   1. Pain due to dental caries  K02.9     2. Dental abscess  K04.7          Paula LibraMolpus, Shoshanna Mcquitty, MD 06/19/21 [phone]

## 2021-06-19 NOTE — ED Triage Notes (Signed)
Pt presents with left sided, lower mandibular/tooth pain since Friday night. Cracked tooth and swelling noted on assessment. Pt has not made a follow-up dentist appointment since missing his 2 weeks ago. Pt denies fevers/chills at home. Chronic percocet user due to knee/back pain.

## 2021-06-28 ENCOUNTER — Ambulatory Visit: Payer: Managed Care, Other (non HMO) | Admitting: Emergency Medicine

## 2021-06-28 ENCOUNTER — Encounter: Payer: Self-pay | Admitting: Emergency Medicine

## 2021-06-28 ENCOUNTER — Telehealth: Payer: Self-pay

## 2021-06-28 ENCOUNTER — Other Ambulatory Visit: Payer: Self-pay

## 2021-06-28 DIAGNOSIS — D862 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: Secondary | ICD-10-CM | POA: Diagnosis not present

## 2021-06-28 DIAGNOSIS — G4733 Obstructive sleep apnea (adult) (pediatric): Secondary | ICD-10-CM | POA: Diagnosis not present

## 2021-06-28 NOTE — Patient Instructions (Signed)
We will go ahead and reschedule your CT scan of the chest. Decrease your prednisone to 20 mg daily for 2 weeks.  If you are doing well at that time then decrease to 15 mg daily and stay at this dose until follow-up. Keep your albuterol available to use 2 puffs if needed for shortness of breath, chest tightness, wheezing.  We will refill this for you today. Continue to wear your BiPAP every night reliably. We will make a referral to rheumatology to discuss options for steroid sparing anti-inflammatory medication to keep your sarcoidosis stabilized.  Its not clear to me that restarting your methotrexate is going to give adequate control. Follow with Dr Delton CoombesByrum in 1 month or next available after your CT chest so we can review.

## 2021-06-28 NOTE — Assessment & Plan Note (Signed)
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  He is improved with prednisone, currently on 30 mg daily for the last 6 weeks.  I think we can try to start weaning the prednisone to the lowest effective dose.  The bigger question will be how to maintain remission going forward.  We initially had trouble with methotrexate compliance but once he was taking reliably it was not clear to me that it was suppressing his sarcoidosis adequately.  Difficult case.  I think we need to involve rheumatology, see if there are any other agents that would be beneficial. He needs a repeat CT scan of his chest and we will schedule this now. Okay to continue to keep albuterol available to use if needed.

## 2021-06-28 NOTE — Progress Notes (Signed)
Subjective:    Patient ID: Shella Spearing J , male    DOB: 07/17/1980, 41 y.o.   MRN: [id number]  HPI  ROV 08/15/20 --Darren is 7238 and returns today for follow-up of his sarcoidosis, obesity with OSA/OHS on CPAP.  We have been managing him on prednisone 10 mg daily, methotrexate 12.5 mg weekly plus folate.  He tells me that he has been off these medications: he quit MTX about 6 weeks ago, quit the pred since about 2 weeks ago - he has been very concerned about the associated wt gain, ? Some pain associated w the MTX. Since he came off the pred his breathing has been worse - more exertional SOB.  He has not used any albuterol. Not using steroid cream for his hands.   BiPAP titration study done 05/24/2020 reviewed showed optimal BiPAP pressures of 27/22 cmH2O.  He did not need oxygen bled in.  ROV 01/10/21 --41 year old man with a history of pulmonary and cutaneous sarcoidosis, obesity with OSA/OHS on BiPAP.  When I last saw him in April he had come off of his methotrexate and his prednisone.  He wanted to stay off of it and so we did not restart.  In July he developed cough and progressive dyspnea. No real URI sx. We did not treat with Pred. Today he reports that he is doing better. He is still waiting for his new BiPAP machine from [employer]. He is reliable with his current BiPAP, good clinical benefit from the mask. Very rare albuterol use.  Last time I saw him he asked me for narcotics which I did give him.  I also referred him back to the pain center and this has been beneficial.  Last CT scan of his chest was 09/10/2017 with mediastinal adenopathy and right perihilar pulmonary parenchymal nodular disease, left upper lobe platelike atelectasis   ROV 06/28/21 -- is 40 with a history of pulmonary and cutaneous sarcoidosis, severe obesity with OSA/OHS for which he uses BiPAP.  He saw Dr. Francine Gravenewald 12/29 with progression of his cutaneous symptoms in his bilateral palms, increasing dyspnea, increasing joint  pain.  He was started back on prednisone 30 mg daily.  CT scan of the chest has been ordered but not yet done.  Today he reports that the prednisone has helped his breathing and his skin changes have both improved on the pred. He needs an albuterol inhaler.  Good compliance with his BiPAP.      Review of Systems As per hpi     Objective:   Physical Exam Vitals:   06/28/21 1207  BP: (!) 144/78  Pulse: 80  Temp: 98.1 F (36.7 C)  TempSrc: Oral  SpO2: 97%  Weight: (!) 566 lb 3.2 oz (256.8 kg)  Height: 5\' 8"  (1.727 m)   Gen: Pleasant, morbidly obese man, in no distress,  normal affect  ENT: No lesions,  mouth clear,  oropharynx clear, no postnasal drip  Neck: No JVD, no stridor  Lungs: No use of accessory muscles, no crackles or wheezing on normal respiration, no wheeze on forced expiration  Cardiovascular: RRR, heart sounds normal, no murmur or gallops, no peripheral edema  Musculoskeletal: No deformities, no cyanosis or clubbing  Neuro: alert, awake, non focal  Skin: He has a dry mixed hypopigmented with areas of dark rash on the palms of R hand, L palm is involved but not the fingers.  He states that both hands are improved compared with his visit in December.  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  He is improved with prednisone, currently on 30 mg daily for the last 6 weeks.  I think we can try to start weaning the prednisone to the lowest effective dose.  The bigger question will be how to maintain remission going forward.  We initially had trouble with methotrexate compliance but once he was taking reliably it was not clear to me that it was suppressing his sarcoidosis adequately.  Difficult case.  I think we need to involve rheumatology, see if there are any other agents that would be beneficial. He needs a repeat CT scan of  his chest and we will schedule this now. Okay to continue to keep albuterol available to use if needed.  Obstructive sleep apnea Good compliance with his nocturnal BiPAP.  Good clinical benefit confirmed today   Levy Pupa, MD, PhD 06/28/2021, 12:32 PM [facility] [phone] or if no answer [phone]

## 2021-06-28 NOTE — Addendum Note (Signed)
Addended by: Dorisann Frames R on: 06/28/2021 12:38 PM   Modules accepted: Orders

## 2021-06-28 NOTE — Telephone Encounter (Signed)
Could we please get pt scheduled for CT that is ordered ASAP per Dr. Delton Coombes? Thank you

## 2021-06-28 NOTE — Assessment & Plan Note (Signed)
Good compliance with his nocturnal BiPAP.  Good clinical benefit confirmed today

## 2021-06-29 NOTE — Telephone Encounter (Addendum)
Order was placed in 12/2020.  Pt was scheduled at that time and was "no show".  I have rescheduled CT and spoke to pt and gave him appt info.  Nothing further needed.

## 2021-07-04 ENCOUNTER — Encounter (HOSPITAL_BASED_OUTPATIENT_CLINIC_OR_DEPARTMENT_OTHER): Payer: Managed Care, Other (non HMO) | Admitting: General Surgery

## 2021-07-06 ENCOUNTER — Other Ambulatory Visit: Payer: Self-pay

## 2021-07-06 ENCOUNTER — Encounter: Payer: Self-pay | Admitting: Registered Nurse

## 2021-07-06 ENCOUNTER — Encounter: Payer: Managed Care, Other (non HMO) | Attending: Physical Medicine and Rehabilitation | Admitting: Registered Nurse

## 2021-07-06 ENCOUNTER — Telehealth: Payer: Self-pay

## 2021-07-06 VITALS — BP 128/80 | HR 93 | Temp 98.6°F | Ht 68.0 in | Wt >= 6400 oz

## 2021-07-06 DIAGNOSIS — Z5181 Encounter for therapeutic drug level monitoring: Secondary | ICD-10-CM | POA: Diagnosis not present

## 2021-07-06 DIAGNOSIS — M48062 Spinal stenosis, lumbar region with neurogenic claudication: Secondary | ICD-10-CM | POA: Diagnosis present

## 2021-07-06 DIAGNOSIS — G894 Chronic pain syndrome: Secondary | ICD-10-CM | POA: Diagnosis not present

## 2021-07-06 DIAGNOSIS — Z79891 Long term (current) use of opiate analgesic: Secondary | ICD-10-CM | POA: Diagnosis not present

## 2021-07-06 MED ORDER — OXYCODONE-ACETAMINOPHEN 10-325 MG PO TABS: 1.0000 | ORAL_TABLET | Freq: Four times a day (QID) | ORAL | 0 refills | Status: DC | PRN

## 2021-07-06 NOTE — Progress Notes (Signed)
Subjective:    Patient ID: Anthony [name], male    DOB: 1981-04-24, 41 y.o.   MRN: [id number]  HPI: Anthony [name] is a 41 y.o. male who returns for follow up appointment for chronic pain and medication refill. He states his pain is located in his lower back. He rates his pain 6.His  current exercise regime is walking short distances  and performing stretching exercises.  Ms. Poitier Morphine equivalent is 60.00 MME.     Last UDS was Performed on 06/07/2021, see note for details. Educated on the narcotic policy, he verbalizes understanding.     Pain Inventory Average Pain 7 Pain Right Now 6 My pain is sharp, burning, dull, stabbing, and aching  In the last 24 hours, has pain interfered with the following? General activity 10 Relation with others 10 Enjoyment of life 10 What TIME of day is your pain at its worst? varies Sleep (in general) Poor  Pain is worse with: walking, bending, sitting, inactivity, standing, and some activites Pain improves with: rest and medication Relief from Meds: 6  Family History  Problem Relation Age of Onset   Hypertension Father    Diabetes Father    Stroke Father    Ovarian cancer Maternal Grandmother 56   Lymphoma Paternal Uncle    Social History   Socioeconomic History   Marital status: Married    Spouse name: Not on file   Number of children: Not on file   Years of education: Not on file   Highest education level: Not on file  Occupational History   Occupation: Disabled  Tobacco Use   Smoking status: Former    Packs/day: 0.30    Years: 10.00    Pack years: 3.00    Types: Cigarettes    Quit date: 09/30/2016    Years since quitting: 4.7   Smokeless tobacco: Never  Vaping Use   Vaping Use: Never used  Substance and Sexual Activity   Alcohol use: No   Drug use: No   Sexual activity: Yes    Birth control/protection: Condom  Other Topics Concern   Not on file  Social History Narrative   Not on file   Social Determinants  of Health   Financial Resource Strain: Not on file  Food Insecurity: Not on file  Transportation Needs: Not on file  Physical Activity: Not on file  Stress: Not on file  Social Connections: Not on file   Past Surgical History:  Procedure Laterality Date   ARTHROSCOPY KNEE W/ DRILLING     right knee    ENDOBRONCHIAL ULTRASOUND Bilateral 03/04/2017   Procedure: ENDOBRONCHIAL ULTRASOUND;  Surgeon: Leslye Peer, MD;  Location: [facility];  Service: Cardiopulmonary;  Laterality: Bilateral;   left knee inner growth plate removed     to straighten leg   Past Surgical History:  Procedure Laterality Date   ARTHROSCOPY KNEE W/ DRILLING     right knee    ENDOBRONCHIAL ULTRASOUND Bilateral 03/04/2017   Procedure: ENDOBRONCHIAL ULTRASOUND;  Surgeon: Leslye Peer, MD;  Location: [facility];  Service: Cardiopulmonary;  Laterality: Bilateral;   left knee inner growth plate removed     to straighten leg   Past Medical History:  Diagnosis Date   Anxiety    Depression    Dyspnea    Eczema of hand    Headache    hx of with tooth pain   Obese    Pneumonia    12/2016   Pre-diabetes  Sarcoidosis    Sleep apnea    cpap   BP 128/80    Pulse 93    Temp 98.6 F (37 C)    Ht  (1.727 m)    Wt (!) 571 lb 3.2 oz (259.1 kg)    SpO2 95%    BMI 86.85 kg/m   Opioid Risk Score:   Fall Risk Score:  `1  Depression screen PHQ 2/9  Depression screen [facility] 2/9 07/06/2021 06/07/2021 05/11/2021 03/06/2021 02/09/2021 01/11/2021 06/24/2018  Decreased Interest 0 1 0 0  Down, Depressed, Hopeless 0 0  PHQ - 2 Score 0 0  Altered sleeping - - - - - - -  Tired, decreased energy - - - - - - -  Change in appetite - - - - - - -  Feeling bad or failure about yourself  - - - - - - -  Trouble concentrating - - - - - - -  Moving slowly or fidgety/restless - - - - - - -  Suicidal thoughts - - - - - - -  PHQ-9 Score - - - - - - -     Review of Systems  Constitutional:  Negative.   HENT: Negative.    Eyes: Negative.   Respiratory: Negative.    Cardiovascular: Negative.   Gastrointestinal: Negative.   Endocrine: Negative.   Genitourinary: Negative.   Musculoskeletal:  Positive for back pain and neck pain.  Skin: Negative.   Allergic/Immunologic: Negative.   Neurological: Negative.   Hematological: Negative.   Psychiatric/Behavioral:  Positive for sleep disturbance.       Objective:   Physical Exam Vitals and nursing note reviewed.  Constitutional:      Appearance: Normal appearance. He is obese.  Cardiovascular:     Rate and Rhythm: Normal rate and regular rhythm.     Pulses: Normal pulses.     Heart sounds: Normal heart sounds.  Pulmonary:     Effort: Pulmonary effort is normal.     Breath sounds: Normal breath sounds.  Musculoskeletal:     Cervical back: Normal range of motion and neck supple.     Comments: Normal Muscle Bulk and Muscle Testing Reveals:  Upper Extremities: Full  ROM and Muscle Strength  5/5  Lumbar Paraspinal Tenderness: L-4-L-5 Lower Extremities: Full ROM and Muscle Strength 5/5 Arises from Table Slowly Antalgic  Gait     Skin:    General: Skin is warm and dry.  Neurological:     Mental Status: He is alert and oriented to person, place, and time.  Psychiatric:        Mood and Affect: Mood normal.        Behavior: Behavior normal.         Assessment & Plan:  1.Right Lumbar Radiculitis/ Spinal Stenosis : Continue HEP as Tolerated and Continue to Monitor. 07/06/2021 Refilled: :Oxycodone 10/325 mg one tablet 4 times a day as needed for pain #120. We will continue the opioid monitoring program, this consists of regular clinic visits, examinations, urine drug screen, pill counts as well as use of [place] Controlled Substance Reporting system. A 12 month History has been reviewed on the [place] Controlled Substance Reporting System on 06/07/2021.  2. Thoracic Back Pain: Continue HEP as Tolerated .  Continue to Monitor. 07/06/2021 3. Right Shoulder Pain: Continue HEP as Tolerated and Continue to Monitor.07/06/2021 4. Chronic Pain Syndrome: Continue HEP as Tolerated.  Continue to Monitor. 07/06/2021 5. Morbid Obesity: Continue Healthy Diet Regimen. Continue to Monitor. 02/ 24/2023 6. Chronic Bilateral Knee Pain: No complaints today. Continue HEP as Tolerated. Continue to Monitor. 07/06/2021     F/U in 1 month

## 2021-07-06 NOTE — Telephone Encounter (Signed)
PA for Oxycodone-APAP sent to insurance through CoverMyMeds ?

## 2021-07-12 ENCOUNTER — Ambulatory Visit
Admission: RE | Admit: 2021-07-12 | Discharge: 2021-07-12 | Disposition: A | Discharge status: Home / Self Care | Payer: Managed Care, Other (non HMO) | Source: Ambulatory Visit | Attending: Emergency Medicine | Admitting: Emergency Medicine

## 2021-07-12 ENCOUNTER — Other Ambulatory Visit: Payer: Self-pay

## 2021-07-12 DIAGNOSIS — D862 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: Secondary | ICD-10-CM

## 2021-07-12 NOTE — Telephone Encounter (Signed)
CaseId:[id number];Status:Approved;Review Type:Prior Auth;Coverage Start Date:07/06/2021;Coverage End Date:07/06/2022; ?

## 2021-07-14 ENCOUNTER — Encounter: Payer: Self-pay | Admitting: Emergency Medicine

## 2021-07-14 ENCOUNTER — Telehealth: Payer: Self-pay | Admitting: Pulmonary Disease

## 2021-07-14 ENCOUNTER — Other Ambulatory Visit: Payer: Self-pay | Admitting: Physical Medicine and Rehabilitation

## 2021-07-14 MED ORDER — PREDNISONE 10 MG PO TABS: 20.0000 mg | ORAL_TABLET | Freq: Every day | ORAL | 3 refills | Status: AC

## 2021-07-14 NOTE — Telephone Encounter (Signed)
Mr Anthony [name] called saying that he is out of prednisone. He is on a prednisone taper for sarcoidosis  ?Currently at 20mg /day ?Called in a new prescription to his pharmacy ? ?

## 2021-07-16 NOTE — Telephone Encounter (Signed)
See phone note from 07/14/21. Will close encounter.  ?

## 2021-07-20 ENCOUNTER — Ambulatory Visit: Payer: Managed Care, Other (non HMO) | Admitting: Emergency Medicine

## 2021-07-23 ENCOUNTER — Ambulatory Visit: Payer: Managed Care, Other (non HMO) | Admitting: Pulmonary Disease

## 2021-07-30 NOTE — Progress Notes (Addendum)
Shella SpearingHUNTLEY, Dantae J. ([id number]) ?Visit Report for 07/31/2021 ?Allergy List Details ?Patient Name: Date of Service: ?[name], [name] 07/31/2021 1:15 PM ?Medical Record Number: [id number] ?Patient Account Number: [id number] ?Date of Birth/Sex: Treating RN: ?08/08/1980 (41 y.o. M) ?Primary Care Lauren Aguayo: PCP, NO Other Clinician: ?Referring Rachel Rison: ?Treating Leory Allinson/Extender: Geralyn CorwinHoffman, Jessica ?Weeks in Treatment: 0 ?Allergies ?Active Allergies ?Xarelto ?Severity: Severe ?Active: 03/03/2017 ?Allergy Notes ?Electronic Signature(s) ?Signed: 07/31/2021 4:35:21 PM By: Antonieta IbaBarnhart, [name] ?Previous Signature: 07/30/2021 6:09:14 PM Version By: Haywood PaoScammell, Michael CHT EMT BS ?, , ?Entered By: Antonieta IbaBarnhart, [name] on 07/31/2021 13:37:35 ?-------------------------------------------------------------------------------- ?Arrival Information Details ?Patient Name: Date of Service: ?[name], [name] 07/31/2021 1:15 PM ?Medical Record Number: [id number] ?Patient Account Number: [id number] ?Date of Birth/Sex: Treating RN: ?08/06/1980 (41 y.o. Lytle MichaelsM) Barnhart, [name] ?Primary Care Sarvesh Meddaugh: PCP, NO Other Clinician: ?Referring Willia Genrich: ?Treating Robbi Spells/Extender: Geralyn CorwinHoffman, Jessica ?Weeks in Treatment: 0 ?Visit Information ?Patient Arrived: Ambulatory ?Arrival Time: 13:28 ?Transfer Assistance: None ?Patient Identification Verified: Yes ?Secondary Verification Process Completed: Yes ?Patient Requires Transmission-Based Precautions: No ?Patient Has Alerts: No ?Electronic Signature(s) ?Signed: 07/31/2021 4:35:21 PM By: Antonieta IbaBarnhart, [name] ?Entered By: Antonieta IbaBarnhart, [name] on 07/31/2021 13:33:24 ?-------------------------------------------------------------------------------- ?Clinic Level of Care Assessment Details ?Patient Name: Date of Service: ?[name], [name] 07/31/2021 1:15 PM ?Medical Record Number: [id number] ?Patient Account Number: [id number] ?Date of Birth/Sex: Treating RN: ?03/09/1981 (41 y.o. Lytle MichaelsM) Barnhart, [name] ?Primary Care Antonela Freiman: PCP, NO Other  Clinician: ?Referring Khloey Chern: ?Treating Lavante Toso/Extender: Geralyn CorwinHoffman, Jessica ?Weeks in Treatment: 0 ?Clinic Level of Care Assessment Items ?TOOL 1 Quantity Score ?X- 1 0 ?Use when EandM and Procedure is performed on INITIAL visit ?ASSESSMENTS - Nursing Assessment / Reassessment ?X- 1 20 ?General Physical Exam (combine w/ comprehensive assessment (listed just below) when performed on new pt. evals) ?X- 1 25 ?Comprehensive Assessment (HX, ROS, Risk Assessments, Wounds Hx, etc.) ?ASSESSMENTS - Wound and Skin Assessment / Reassessment ?[]  - 0 ?Dermatologic / Skin Assessment (not related to wound area) ?ASSESSMENTS - Ostomy and/or Continence Assessment and Care ?[]  - 0 ?Incontinence Assessment and Management ?[]  - 0 ?Ostomy Care Assessment and Management (repouching, etc.) ?PROCESS - Coordination of Care ?[]  - 0 ?Simple Patient / Family Education for ongoing care ?X- 1 20 ?Complex (extensive) Patient / Family Education for ongoing care ?[]  - 0 ?Staff obtains Consents, Records, T Results / Process Orders ?est ?X- 1 10 ?Staff telephones HHA, Nursing Homes / Clarify orders / etc ?[]  - 0 ?Routine Transfer to another Facility (non-emergent condition) ?[]  - 0 ?Routine Hospital Admission (non-emergent condition) ?[]  - 0 ?New Admissions / Manufacturing engineernsurance Authorizations / Ordering NPWT Apligraf, etc. ?, ?[]  - 0 ?Emergency Hospital Admission (emergent condition) ?PROCESS - Special Needs ?[]  - 0 ?Pediatric / Minor Patient Management ?[]  - 0 ?Isolation Patient Management ?[]  - 0 ?Hearing / Language / Visual special needs ?[]  - 0 ?Assessment of Community assistance (transportation, D/C planning, etc.) ?[]  - 0 ?Additional assistance / Altered mentation ?[]  - 0 ?Support Surface(s) Assessment (bed, cushion, seat, etc.) ?INTERVENTIONS - Miscellaneous ?[]  - 0 ?External ear exam ?[]  - 0 ?Patient Transfer (multiple staff / Nurse, adultHoyer Lift / Similar devices) ?[]  - 0 ?Simple Staple / Suture removal (25 or less) ?[]  - 0 ?Complex Staple / Suture removal  (26 or more) ?[]  - 0 ?Hypo/Hyperglycemic Management (do not check if billed separately) ?X- 1 15 ?Ankle / Brachial Index (ABI) - do not check if billed separately ?Has the patient been seen at the hospital within the last three years: Yes ?Total Score: 90 ?  Level Of Care: New/Established - Level 3 ?Electronic Signature(s) ?Signed: 07/31/2021 4:35:21 PM By: Antonieta Iba ?Entered By: Antonieta Iba on 07/31/2021 14:52:50 ?-------------------------------------------------------------------------------- ?Compression Therapy Details ?Patient Name: ?Date of Service: ?[name], [name] 07/31/2021 1:15 PM ?Medical Record Number: [id number] ?Patient Account Number: [id number] ?Date of Birth/Sex: ?Treating RN: ?[birth year] (41 y.o. [name] ?Primary Care Jailey Booton: PCP, NO ?Other Clinician: ?Referring Anaise Sterbenz: ?Treating Xiong Haidar/Extender: Geralyn Corwin ?Weeks in Treatment: 0 ?Compression Therapy Performed for Wound Assessment: Wound #1 Right,Medial Lower Leg ?Performed By: Clinician Antonieta Iba, RN ?Compression Type: Three Layer ?Post Procedure Diagnosis ?Same as Pre-procedure ?Electronic Signature(s) ?Signed: 07/31/2021 4:35:21 PM By: Antonieta Iba ?Entered By: Antonieta Iba on 07/31/2021 14:31:29 ?-------------------------------------------------------------------------------- ?Encounter Discharge Information Details ?Patient Name: ?Date of Service: ?[name], [name] 07/31/2021 1:15 PM ?Medical Record Number: [id number] ?Patient Account Number: [id number] ?Date of Birth/Sex: ?Treating RN: ?December 17, 1980 (41 y.o. [name] ?Primary Care Perle Gibbon: PCP, NO ?Other Clinician: ?Referring Sheleen Conchas: ?Treating Armon Orvis/Extender: Geralyn Corwin ?Weeks in Treatment: 0 ?Encounter Discharge Information Items Post Procedure Vitals ?Discharge Condition: Stable ?Temperature (F): 98.9 ?Ambulatory Status: Ambulatory ?Pulse (bpm): 99 ?Discharge Destination: Home ?Respiratory Rate (breaths/min): 20 ?Transportation: Private  Auto ?Blood Pressure (mmHg): 168/85 ?Schedule Follow-up Appointment: Yes ?Clinical Summary of Care: Provided on 07/31/2021 ?Form Type Recipient ?Paper Patient Patient ?Electronic Signature(s) ?Signed: 07/31/2021 4:35:21 PM By: Antonieta Iba ?Entered By: Antonieta Iba on 07/31/2021 14:53:51 ?-------------------------------------------------------------------------------- ?Lower Extremity Assessment Details ?Patient Name: ?Date of Service: ?[name], [name] 07/31/2021 1:15 PM ?Medical Record Number: [id number] ?Patient Account Number: [id number] ?Date of Birth/Sex: ?Treating RN: ?May 02, 1981 (41 y.o. [name] ?Primary Care Elicia Lui: PCP, NO ?Other Clinician: ?Referring Mechelle Pates: ?Treating Azrael Huss/Extender: Geralyn Corwin ?Weeks in Treatment: 0 ?Edema Assessment ?Assessed: [Left: No] [Right: Yes] ?Edema: [Left: Ye] [Right: s] ?Calf ?Left: Right: ?Point of Measurement: 29 cm From Medial Instep 59.7 cm ?Ankle ?Left: Right: ?Point of Measurement: 11 cm From Medial Instep 32.4 cm ?Knee To Floor ?Left: Right: ?From Medial Instep 34 cm ?Vascular Assessment ?Pulses: ?Dorsalis Pedis ?Palpable: [Right:Yes] ?Blood Pressure: ?Brachial: [Right:168] ?Ankle: ?[Right:Dorsalis Pedis: 160 0.95] ?Electronic Signature(s) ?Signed: 07/31/2021 4:35:21 PM By: Antonieta Iba ?Entered By: Antonieta Iba on 07/31/2021 13:51:02 ?-------------------------------------------------------------------------------- ?Multi Wound Chart Details ?Patient Name: ?Date of Service: ?[name], [name] 07/31/2021 1:15 PM ?Medical Record Number: [id number] ?Patient Account Number: [id number] ?Date of Birth/Sex: ?Treating RN: ?04-07-1981 (41 y.o. M) ?Primary Care Vester Balthazor: PCP, NO ?Other Clinician: ?Referring Mackensey Bolte: ?Treating Mertie Haslem/Extender: Geralyn Corwin ?Weeks in Treatment: 0 ?Vital Signs ?Height(in): 68 ?Pulse(bpm): 99 ?Weight(lbs): 572 ?Blood Pressure(mmHg): 168/85 ?Body Mass Index(BMI): 87 ?Temperature(??F): 98.9 ?Respiratory  Rate(breaths/min): 20 ?Photos: [N/A:N/A] ?Right, Medial Lower Leg N/A N/A ?Wound Location: ?Other Lesion N/A N/A ?Wounding Event: ?Venous Leg Ulcer N/A N/A ?Primary Etiology: ?Sleep Apnea, Rheumatoid Arthritis, N/A N/A ?[employer]

## 2021-07-31 ENCOUNTER — Other Ambulatory Visit: Payer: Self-pay

## 2021-07-31 ENCOUNTER — Encounter (HOSPITAL_BASED_OUTPATIENT_CLINIC_OR_DEPARTMENT_OTHER): Payer: Commercial Managed Care - HMO | Attending: Internal Medicine | Admitting: Internal Medicine

## 2021-07-31 DIAGNOSIS — D862 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: Secondary | ICD-10-CM | POA: Diagnosis not present

## 2021-07-31 DIAGNOSIS — G4733 Obstructive sleep apnea (adult) (pediatric): Secondary | ICD-10-CM | POA: Insufficient documentation

## 2021-07-31 DIAGNOSIS — L97812 Non-pressure chronic ulcer of other part of right lower leg with fat layer exposed: Secondary | ICD-10-CM | POA: Insufficient documentation

## 2021-07-31 DIAGNOSIS — Z6841 Body Mass Index (BMI) 40.0 and over, adult: Secondary | ICD-10-CM | POA: Insufficient documentation

## 2021-07-31 DIAGNOSIS — I87331 Chronic venous hypertension (idiopathic) with ulcer and inflammation of right lower extremity: Secondary | ICD-10-CM | POA: Insufficient documentation

## 2021-07-31 NOTE — Progress Notes (Signed)
Shella SpearingHUNTLEY, Joy J. ([id number]) ?Visit Report for 07/31/2021 ?Abuse Risk Screen Details ?Patient Name: Date of Service: ?Dessert, [name] 07/31/2021 1:15 PM ?Medical Record Number: [id number] ?Patient Account Number: [id number] ?Date of Birth/Sex: Treating RN: ?09/07/1980 (41 y.o. Lytle MichaelsM) Barnhart, [name] ?Primary Care Flavio Lindroth: PCP, NO Other Clinician: ?Referring Juni Glaab: ?Treating Erine Phenix/Extender: Geralyn CorwinHoffman, Jessica ?Weeks in Treatment: 0 ?Abuse Risk Screen Items ?Answer ?ABUSE RISK SCREEN: ?Has anyone close to you tried to hurt or harm you recentlyo No ?Do you feel uncomfortable with anyone in your familyo No ?Has anyone forced you do things that you didnt want to doo No ?Electronic Signature(s) ?Signed: 07/31/2021 4:35:21 PM By: Antonieta IbaBarnhart, [name] ?Entered By: Antonieta IbaBarnhart, [name] on 07/31/2021 13:42:04 ?-------------------------------------------------------------------------------- ?Activities of Daily Living Details ?Patient Name: Date of Service: ?Grindstaff, [name] 07/31/2021 1:15 PM ?Medical Record Number: [id number] ?Patient Account Number: [id number] ?Date of Birth/Sex: Treating RN: ?07/03/1980 (41 y.o. Lytle MichaelsM) Barnhart, [name] ?Primary Care Nazly Digilio: PCP, NO Other Clinician: ?Referring Lesean Woolverton: ?Treating Yassin Scales/Extender: Geralyn CorwinHoffman, Jessica ?Weeks in Treatment: 0 ?Activities of Daily Living Items ?Answer ?Activities of Daily Living (Please select one for each item) ?Drive Automobile Completely Able ?T Medications ?ake Completely Able ?Use T elephone Completely Able ?Care for Appearance Completely Able ?Use T oilet Completely Able ?Bath / Shower Completely Able ?Dress Self Completely Able ?Feed Self Completely Able ?Walk Completely Able ?Get In / Out Bed Completely Able ?Housework Completely Able ?Prepare Meals Completely Able ?Handle Money Completely Able ?Shop for Self Completely Able ?Electronic Signature(s) ?Signed: 07/31/2021 4:35:21 PM By: Antonieta IbaBarnhart, [name] ?Entered By: Antonieta IbaBarnhart, [name] on 07/31/2021  13:42:25 ?-------------------------------------------------------------------------------- ?Education Screening Details ?Patient Name: ?Date of Service: ?Iiams, [name] 07/31/2021 1:15 PM ?Medical Record Number: [id number] ?Patient Account Number: [id number] ?Date of Birth/Sex: ?Treating RN: ?10/31/1980 (41 y.o. Lytle MichaelsM) Barnhart, [name] ?Primary Care Stafford Riviera: PCP, NO ?Other Clinician: ?Referring Bhavana Kady: ?Treating Ezra Denne/Extender: Geralyn CorwinHoffman, Jessica ?Weeks in Treatment: 0 ?Primary Learner Assessed: Patient ?Learning Preferences/Education Level/Primary Language ?Learning Preference: Explanation, Demonstration, Printed Material ?Highest Education Level: High School ?Preferred Language: English ?Cognitive Barrier ?Language Barrier: No ?Translator Needed: No ?Memory Deficit: No ?Emotional Barrier: No ?Cultural/Religious Beliefs Affecting Medical Care: No ?Physical Barrier ?Impaired Vision: Yes Glasses ?Impaired Hearing: No ?Decreased Hand dexterity: No ?Knowledge/Comprehension ?Knowledge Level: High ?Comprehension Level: High ?Ability to understand written instructions: High ?Ability to understand verbal instructions: High ?Motivation ?Anxiety Level: Calm ?Cooperation: Cooperative ?Education Importance: Acknowledges Need ?Interest in Health Problems: Asks Questions ?Perception: Coherent ?Willingness to Engage in Self-Management High ?Activities: ?Readiness to Engage in Self-Management High ?Activities: ?Electronic Signature(s) ?Signed: 07/31/2021 4:35:21 PM By: Antonieta IbaBarnhart, [name] ?Entered By: Antonieta IbaBarnhart, [name] on 07/31/2021 13:42:57 ?-------------------------------------------------------------------------------- ?Fall Risk Assessment Details ?Patient Name: ?Date of Service: ?Herder, [name] 07/31/2021 1:15 PM ?Medical Record Number: [id number] ?Patient Account Number: [id number] ?Date of Birth/Sex: ?Treating RN: ?10/22/1980 (41 y.o. Lytle MichaelsM) Barnhart, [name] ?Primary Care Debroah Shuttleworth: PCP, NO ?Other Clinician: ?Referring  Kaelyn Nauta: ?Treating Jenie Parish/Extender: Geralyn CorwinHoffman, Jessica ?Weeks in Treatment: 0 ?Fall Risk Assessment Items ?Have you had 2 or more falls in the last 12 monthso 0 No ?Have you had any fall that resulted in injury in the last 12 monthso 0 No ?FALLS RISK SCREEN ?History of falling - immediate or within 3 months 0 No ?Secondary diagnosis (Do you have 2 or more medical diagnoseso) 15 Yes ?Ambulatory aid ?None/bed rest/wheelchair/nurse 0 Yes ?Crutches/cane/walker 0 No ?Furniture 0 No ?Intravenous therapy Access/Saline/Heparin Lock 0 No ?Gait/Transferring ?Normal/ bed rest/ wheelchair 0 Yes ?Weak (short steps with or without shuffle, stooped but  able to lift head while walking, may seek 0 No ?support from furniture) ?Impaired (short steps with shuffle, may have difficulty arising from chair, head down, impaired 0 No ?balance) ?Mental Status ?Oriented to own ability 0 Yes ?Electronic Signature(s) ?Signed: 07/31/2021 4:35:21 PM By: Antonieta Iba ?Entered By: Antonieta Iba on 07/31/2021 13:43:17 ?-------------------------------------------------------------------------------- ?Foot Assessment Details ?Patient Name: ?Date of Service: ?Backstrom, [name] 07/31/2021 1:15 PM ?Medical Record Number: [id number] ?Patient Account Number: [id number] ?Date of Birth/Sex: ?Treating RN: ?[birth year] (41 y.o. [name] ?Primary Care Idabelle Mcpeters: PCP, NO ?Other Clinician: ?Referring Tatumn Corbridge: ?Treating Zanovia Rotz/Extender: Geralyn Corwin ?Weeks in Treatment: 0 ?Foot Assessment Items ?Site Locations ?+ = Sensation present, - = Sensation absent, C = Callus, U = Ulcer ?R = Redness, W = Warmth, M = Maceration, PU = Pre-ulcerative lesion ?F = Fissure, S = Swelling, D = Dryness ?Assessment ?Right: Left: ?Other Deformity: No No ?Prior Foot Ulcer: No No ?Prior Amputation: No No ?Charcot Joint: No No ?Ambulatory Status: Ambulatory Without Help ?Gait: Steady ?Electronic Signature(s) ?Signed: 07/31/2021 4:35:21 PM By: Antonieta Iba ?Entered By:  Antonieta Iba on 07/31/2021 13:46:17 ?-------------------------------------------------------------------------------- ?Nutrition Risk Screening Details ?Patient Name: ?Date of Service: ?Stankus, [name] 07/31/2021 1:15 PM ?Medical Record Number: [id number] ?Patient Account Number: [id number] ?Date of Birth/Sex: ?Treating RN: ?[birth year] (41 y.o. [name] ?Primary Care Makalya Nave: PCP, NO ?Other Clinician: ?Referring Maebel Marasco: ?Treating Tuwanna Krausz/Extender: Geralyn Corwin ?Weeks in Treatment: 0 ?Height (in): 68 ?Weight (lbs): 572 ?Body Mass Index (BMI): 87 ?Nutrition Risk Screening Items ?Score Screening ?NUTRITION RISK SCREEN: ?I have an illness or condition that made me change the kind and/or amount of food I eat 0 No ?I eat fewer than two meals per day 0 No ?I eat few fruits and vegetables, or milk products 0 No ?I have three or more drinks of beer, liquor or wine almost every day 0 No ?I have tooth or mouth problems that make it hard for me to eat 0 No ?I don't always have enough money to buy the food I need 0 No ?I eat alone most of the time 0 No ?I take three or more different prescribed or over-the-counter drugs a day 1 Yes ?Without wanting to, I have lost or gained 10 pounds in the last six months 0 No ?I am not always physically able to shop, cook and/or feed myself 0 No ?Nutrition Protocols ?Good Risk Protocol 0 No interventions needed ?Moderate Risk Protocol ?High Risk Proctocol ?Risk Level: Good Risk ?Score: 1 ?Electronic Signature(s) ?Signed: 07/31/2021 4:35:21 PM By: Antonieta Iba ?Entered By: Antonieta Iba on 07/31/2021 13:43:25 ?

## 2021-07-31 NOTE — Progress Notes (Signed)
SHAHIN, POLCZYNSKI ([id number]) ?Visit Report for 07/31/2021 ?Chief Complaint Document Details ?Patient Name: Date of Service: ?Bunning, [name] 07/31/2021 1:15 PM ?Medical Record Number: [id number] ?Patient Account Number: [id number] ?Date of Birth/Sex: Treating RN: ?07/04/1980 (41 y.o. M) ?Primary Care Provider: PCP, NO Other Clinician: ?Referring Provider: ?Treating Provider/Extender: Geralyn Corwin ?Weeks in Treatment: 0 ?Information Obtained from: Patient ?Chief Complaint ?Right lower extremity wound ?Electronic Signature(s) ?Signed: 07/31/2021 3:01:22 PM By: Geralyn Corwin DO ?Entered By: Geralyn Corwin on 07/31/2021 14:49:13 ?-------------------------------------------------------------------------------- ?Debridement Details ?Patient Name: Date of Service: ?Salmi, [name] 07/31/2021 1:15 PM ?Medical Record Number: [id number] ?Patient Account Number: [id number] ?Date of Birth/Sex: Treating RN: ?1980/12/28 (41 y.o. [name] ?Primary Care Provider: PCP, NO Other Clinician: ?Referring Provider: ?Treating Provider/Extender: Geralyn Corwin ?Weeks in Treatment: 0 ?Debridement Performed for Assessment: Wound #1 Right,Medial Lower Leg ?Performed By: Physician Geralyn Corwin, DO ?Debridement Type: Debridement ?Severity of Tissue Pre Debridement: Fat layer exposed ?Level of Consciousness (Pre-procedure): Awake and Alert ?Pre-procedure Verification/Time Out Yes - 14:22 ?Taken: ?Start Time: 14:23 ?Pain Control: ?Other : Benzocaine ?T Area Debrided (L x W): ?otal 0.6 (cm) x 1 (cm) = 0.6 (cm?) ?Tissue and other material debrided: Non-Viable, Slough, Subcutaneous, Slough ?Level: Skin/Subcutaneous Tissue ?Debridement Description: Excisional ?Instrument: Curette ?Bleeding: Minimum ?Hemostasis Achieved: Pressure ?End Time: 14:29 ?Response to Treatment: Procedure was tolerated well ?Level of Consciousness (Post- Awake and Alert ?procedure): ?Post Debridement Measurements of Total Wound ?Length: (cm)  0.6 ?Width: (cm) 1 ?Depth: (cm) 0.3 ?Volume: (cm?) 0.141 ?Character of Wound/Ulcer Post Debridement: Stable ?Severity of Tissue Post Debridement: Fat layer exposed ?Post Procedure Diagnosis ?Same as Pre-procedure ?Electronic Signature(s) ?Signed: 07/31/2021 3:01:22 PM By: Geralyn Corwin DO ?Signed: 07/31/2021 4:35:21 PM By: Antonieta Iba ?Entered By: Antonieta Iba on 07/31/2021 14:30:16 ?-------------------------------------------------------------------------------- ?HPI Details ?Patient Name: Date of Service: ?Mifsud, [name] 07/31/2021 1:15 PM ?Medical Record Number: [id number] ?Patient Account Number: [id number] ?Date of Birth/Sex: Treating RN: ?1981/03/30 (41 y.o. M) ?Primary Care Provider: PCP, NO Other Clinician: ?Referring Provider: ?Treating Provider/Extender: Geralyn Corwin ?Weeks in Treatment: 0 ?History of Present Illness ?HPI Description: Admission 07/31/2021 ?Mr. Tillie Veldkamp is a 41 year old male with a past medical history of sarcoidosis, obstructive sleep apnea and morbid obesity that presents to the clinic for ?a 1 year history of wounds to his right lower extremity. He states these wax and wane in size. He states that it currently does not drain but has weeped in the ?past. He reports taking amoxicillin last month and this helped improve the wound size and appearance. He does not have compression stockings. He currently ?denies signs of infection. ?Electronic Signature(s) ?Signed: 07/31/2021 3:01:22 PM By: Geralyn Corwin DO ?Entered By: Geralyn Corwin on 07/31/2021 14:55:42 ?-------------------------------------------------------------------------------- ?Physical Exam Details ?Patient Name: Date of Service: ?Gilkison, [name] 07/31/2021 1:15 PM ?Medical Record Number: [id number] ?Patient Account Number: [id number] ?Date of Birth/Sex: Treating RN: ?[birth year] (41 y.o. M) ?Primary Care Provider: PCP, NO Other Clinician: ?Referring Provider: ?Treating Provider/Extender: Geralyn Corwin ?Weeks in Treatment: 0 ?Constitutional ?respirations regular, non-labored and within target range for patient.Marland Kitchen ?Cardiovascular ?2+ dorsalis pedis/posterior tibialis pulses. ?Psychiatric ?pleasant and cooperative. ?Notes ?Right lower extremity: Scab to the medial aspect. Post-debridement there was scant nonviable tissue and granulation tissue present. ?Electronic Signature(s) ?Signed: 07/31/2021 3:01:22 PM By: Geralyn Corwin DO ?Entered By: Geralyn Corwin on 07/31/2021 14:56:47 ?-------------------------------------------------------------------------------- ?Physician Orders Details ?Patient Name: ?Date of Service: ?Cordone, [name] 07/31/2021 1:15 PM ?Medical Record Number: [id number] ?Patient  Account Number: [id number] ?Date of Birth/Sex: ?Treating RN: ?08/12/1980 (41 y.o. Lytle MichaelsM) Barnhart, [name] ?Primary Care Provider: PCP, NO ?Other Clinician: ?Referring Provider: ?Treating Provider/Extender: Geralyn CorwinHoffman, Shawnetta Lein ?Weeks in Treatment: 0 ?Verbal / Phone Orders: No ?Diagnosis Coding ?Follow-up Appointments ?ppointment in 1 week. - with Dr. Mikey BussingHoffman Lennox Laity([name], Room 7) ?Return A ?Bathing/ Shower/ Hygiene ?May shower with protection but do not get wound dressing(s) wet. - May use cast protector bag. Can get at [facility], [facility] or [facility] ?Edema Control - Lymphedema / SCD / Other ?Avoid standing for long periods of time. ?Wound Treatment ?Wound #1 - Lower Leg Wound Laterality: Right, Medial ?Cleanser: Soap and Water 1 x Per Week/30 Days ?Discharge Instructions: May shower and wash wound with dial antibacterial soap and water prior to dressing change. ?Cleanser: Wound Cleanser 1 x Per Week/30 Days ?Discharge Instructions: Cleanse the wound with wound cleanser prior to applying a clean dressing using gauze sponges, not tissue or cotton balls. ?Peri-Wound Care: Sween Lotion (Moisturizing lotion) 1 x Per Week/30 Days ?Discharge Instructions: Apply moisturizing lotion as directed ?Topical: Gentamicin 1 x Per Week/30  Days ?Discharge Instructions: As directed by physician ?Prim Dressing: Hydrofera Blue Ready Foam, 2.5 x2.5 in 1 x Per Week/30 Days ?ary ?Discharge Instructions: Apply to wound bed as instructed ?Secondary Dressing: ABD Pad, 5x9 1 x Per Week/30 Days ?Discharge Instructions: Apply over primary dressing as directed. ?Compression Wrap: ThreePress (3 layer compression wrap) 1 x Per Week/30 Days ?Discharge Instructions: Apply three layer compression as directed. ?Compression Wrap: Unnaboot w/Calamine, 4x10 (in/yd) 1 x Per Week/30 Days ?Discharge Instructions: Apply at top to anchor wrap ?Electronic Signature(s) ?Signed: 07/31/2021 3:01:22 PM By: Geralyn CorwinHoffman, Leanord Thibeau DO ?Entered By: Geralyn CorwinHoffman, Odalis Jordan on 07/31/2021 14:57:16 ?-------------------------------------------------------------------------------- ?Problem List Details ?Patient Name: ?Date of Service: ?, [name] 07/31/2021 1:15 PM ?Medical Record Number: [id number] ?Patient Account Number: [id number] ?Date of Birth/Sex: ?Treating RN: ?10/13/1980 (41 y.o. 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 07/31/2021 1:15 PM ?Medical Record Number: [id number] ?Patient  Account Number: [id number] ?Date of Birth/Sex: ?Treating RN: ?01/11/1981 (41 y.o. M) ?Primary Care Provider: PCP, NO ?Other Clinician: ?Referring Provider: ?Treating Provider/Extender: Geralyn CorwinHoffman, Carrolyn Hilmes ?Weeks in Treatment: 0 ?Subjective ?Chief

## 2021-08-01 ENCOUNTER — Encounter: Payer: Self-pay | Admitting: Registered Nurse

## 2021-08-01 ENCOUNTER — Encounter: Payer: Managed Care, Other (non HMO) | Attending: Physical Medicine and Rehabilitation | Admitting: Registered Nurse

## 2021-08-01 VITALS — BP 149/93 | HR 78 | Ht 68.0 in | Wt >= 6400 oz

## 2021-08-01 DIAGNOSIS — Z79891 Long term (current) use of opiate analgesic: Secondary | ICD-10-CM | POA: Insufficient documentation

## 2021-08-01 DIAGNOSIS — G894 Chronic pain syndrome: Secondary | ICD-10-CM | POA: Diagnosis not present

## 2021-08-01 DIAGNOSIS — Z5181 Encounter for therapeutic drug level monitoring: Secondary | ICD-10-CM | POA: Diagnosis not present

## 2021-08-01 DIAGNOSIS — M48062 Spinal stenosis, lumbar region with neurogenic claudication: Secondary | ICD-10-CM | POA: Insufficient documentation

## 2021-08-01 MED ORDER — OXYCODONE-ACETAMINOPHEN 10-325 MG PO TABS
1.0000 | ORAL_TABLET | Freq: Four times a day (QID) | ORAL | 0 refills | Status: DC | PRN
Start: 2021-08-01 — End: 2021-08-30

## 2021-08-01 NOTE — Progress Notes (Signed)
? ?Subjective:  ? ? Patient ID: Anthony [name] , male    DOB: 06/15/1980, 41 y.o.   MRN: [id number] ? ?HPI: Anthony [name]  is a 41 y.o. male who returns for follow up appointment for chronic pain and medication refill. He states his pain is located in his lower back. He rates his pain 6. His current exercise regime is walking short distances and light weights twice a day. ?  ?Mr. Hutley Morphine equivalent is 60.00 MME.   Last UDS was Performed on 06/07/2021, see note for details.  ? ? ?Pain Inventory ?Average Pain 5 ?Pain Right Now 6 ?My pain is sharp, burning, stabbing, and aching ? ?In the last 24 hours, has pain interfered with the following? ?General activity 10 ?Relation with others 10 ?Enjoyment of life 10 ?What TIME of day is your pain at its worst? varies ?Sleep (in general) Poor ? ?Pain is worse with: walking, bending, sitting, inactivity, and standing ?Pain improves with: rest, heat/ice, and medication ?Relief from Meds: 5 ? ?Family History  ?Problem Relation Age of Onset  ? Hypertension Father   ? Diabetes Father   ? Stroke Father   ? Ovarian cancer Maternal Grandmother 60  ? Lymphoma Paternal Uncle   ? ?Social History  ? ?Socioeconomic History  ? Marital status: Married  ?  Spouse name: Not on file  ? Number of children: Not on file  ? Years of education: Not on file  ? Highest education level: Not on file  ?Occupational History  ? Occupation: Disabled  ?Tobacco Use  ? Smoking status: Former  ?  Packs/day: 0.30  ?  Years: 10.00  ?  Pack years: 3.00  ?  Types: Cigarettes  ?  Quit date: 09/30/2016  ?  Years since quitting: 4.8  ? Smokeless tobacco: Never  ?Vaping Use  ? Vaping Use: Never used  ?Substance and Sexual Activity  ? Alcohol use: No  ? Drug use: No  ? Sexual activity: Yes  ?  Birth control/protection: Condom  ?Other Topics Concern  ? Not on file  ?Social History Narrative  ? Not on file  ? ?Social Determinants of Health  ? ?Financial Resource Strain: Not on file  ?Food Insecurity: Not on file   ?Transportation Needs: Not on file  ?Physical Activity: Not on file  ?Stress: Not on file  ?Social Connections: Not on file  ? ?Past Surgical History:  ?Procedure Laterality Date  ? ARTHROSCOPY KNEE W/ DRILLING    ? right knee   ? ENDOBRONCHIAL ULTRASOUND Bilateral 03/04/2017  ? Procedure: ENDOBRONCHIAL ULTRASOUND;  Surgeon: Leslye PeerByrum, Robert S, MD;  Location: [facility];  Service: Cardiopulmonary;  Laterality: Bilateral;  ? left knee inner growth plate removed    ? to straighten leg  ? ?Past Surgical History:  ?Procedure Laterality Date  ? ARTHROSCOPY KNEE W/ DRILLING    ? right knee   ? ENDOBRONCHIAL ULTRASOUND Bilateral 03/04/2017  ? Procedure: ENDOBRONCHIAL ULTRASOUND;  Surgeon: Leslye PeerByrum, Robert S, MD;  Location: [facility];  Service: Cardiopulmonary;  Laterality: Bilateral;  ? left knee inner growth plate removed    ? to straighten leg  ? ?Past Medical History:  ?Diagnosis Date  ? Anxiety   ? Depression   ? Dyspnea   ? Eczema of hand   ? Headache   ? hx of with tooth pain  ? Obese   ? Pneumonia   ? 12/2016  ? Pre-diabetes   ? Sarcoidosis   ? Sleep apnea   ? cpap  ? ?  BP (!) 149/93   Pulse 78   Ht 5\' 8"  (1.727 m)   Wt (!) 575 lb 9.6 oz (261.1 kg)   SpO2 96%   BMI 87.52 kg/m?  ? ?Opioid Risk Score:   ?Fall Risk Score:  `1 ? ?Depression screen PHQ 2/9 ? ? ?  07/06/2021  ?  8:43 AM 06/07/2021  ? 10:15 AM 05/11/2021  ?  8:17 AM 03/06/2021  ?  8:51 AM 02/09/2021  ?  9:05 AM 01/11/2021  ?  8:46 AM 06/24/2018  ?  8:17 AM  ?Depression screen PHQ 2/9  ?Decreased Interest 0 1 0 1 1 1  0  ?Down, Depressed, Hopeless 0 1 1 1 1 1  0  ?PHQ - 2 Score 0 2 1 2 2 2  0  ?  ?Review of Systems  ?Musculoskeletal:  Positive for back pain.  ?     Bilateral knee pain ?Right back of leg pain to back of knee  ?All other systems reviewed and are negative. ? ?   ?Objective:  ? Physical Exam ?Vitals and nursing note reviewed.  ?Constitutional:   ?   Appearance: Normal appearance.  ?Cardiovascular:  ?   Rate and Rhythm: Normal rate and regular rhythm.   ?   Pulses: Normal pulses.  ?   Heart sounds: Normal heart sounds.  ?Musculoskeletal:  ?   Cervical back: Normal range of motion and neck supple.  ?   Comments: Normal Muscle Bulk and Muscle Testing Reveals: ? Upper Extremities: Full ROM and Muscle Strength 5/5 ?Lumbar Paraspinal Tenderness: L-4-L-5 ?Lower Extremities: Full ROM and Muscle Strength 5/5 ?Arises from Table Slowly ?Antalgic  Gait  ?   ?Skin: ?   General: Skin is warm and dry.  ?Neurological:  ?   Mental Status: He is alert.  ? ? ? ? ?   ?Assessment & Plan:  ?1.Right Lumbar Radiculitis/ Spinal Stenosis : Continue HEP as Tolerated and Continue to Monitor. 08/01/2021 ?Refilled: :Oxycodone 10/325 mg one tablet 4 times a day as needed for pain #120. We will continue the opioid monitoring program, this consists of regular clinic visits, examinations, urine drug screen, pill counts as well as use of [place] Controlled Substance Reporting system. A 12 month History has been reviewed on the [place] Controlled Substance Reporting System on 08/01/2021.  ?2. Thoracic Back Pain: No complaints today.Continue HEP as Tolerated . Continue to Monitor. 08/01/2021 ?3. Right Shoulder Pain: No Complaints today. Continue HEP as Tolerated and Continue to Monitor.08/01/2021 ?4. Chronic Pain Syndrome: Continue HEP as Tolerated. Continue to Monitor. 08/01/2021 ?5. Morbid Obesity: Continue Healthy Diet Regimen. Continue to Monitor. 08/01/2021 ?6. Chronic Bilateral Knee Pain: No complaints today. Continue HEP as Tolerated. Continue to Monitor. 08/01/2021 ?  ?  ?F/U in 1 month  ?  ?  ?  ? ?

## 2021-08-07 ENCOUNTER — Other Ambulatory Visit: Payer: Self-pay

## 2021-08-07 ENCOUNTER — Encounter (HOSPITAL_BASED_OUTPATIENT_CLINIC_OR_DEPARTMENT_OTHER): Payer: Commercial Managed Care - HMO | Admitting: Internal Medicine

## 2021-08-07 DIAGNOSIS — L97812 Non-pressure chronic ulcer of other part of right lower leg with fat layer exposed: Secondary | ICD-10-CM | POA: Diagnosis not present

## 2021-08-07 NOTE — Progress Notes (Signed)
Shella SpearingHUNTLEY, Andersen J. ([id number]) ?Visit Report for 08/07/2021 ?Chief Complaint Document Details ?Patient Name: Date of Service: ?Kramlich, [name] 08/07/2021 9:30 A M ?Medical Record Number: [id number] ?Patient Account Number: [id number] ?Date of Birth/Sex: Treating RN: ?06/06/1980 (41 y.o. Lytle MichaelsM) Barnhart, [name] ?Primary Care Provider: PCP, NO Other Clinician: ?Referring Provider: ?Treating Provider/Extender: Geralyn CorwinHoffman, Sheylin Scharnhorst ?Weeks in Treatment: 1 ?Information Obtained from: Patient ?Chief Complaint ?Right lower extremity wound ?Electronic Signature(s) ?Signed: 08/07/2021 10:19:48 AM By: Geralyn CorwinHoffman, Eleonore Shippee DO ?Entered By: Geralyn CorwinHoffman, Teneil Shiller on 08/07/2021 10:16:41 ?-------------------------------------------------------------------------------- ?Debridement Details ?Patient Name: Date of Service: ?Humm, [name] 08/07/2021 9:30 A M ?Medical Record Number: [id number] ?Patient Account Number: [id number] ?Date of Birth/Sex: Treating RN: ?09/24/1980 (41 y.o. Lytle MichaelsM) Barnhart, [name] ?Primary Care Provider: PCP, NO Other Clinician: ?Referring Provider: ?Treating Provider/Extender: Geralyn CorwinHoffman, Laquana Villari ?Weeks in Treatment: 1 ?Debridement Performed for Assessment: Wound #1 Right,Medial Lower Leg ?Performed By: Physician Geralyn CorwinHoffman, Develle Sievers, DO ?Debridement Type: Debridement ?Severity of Tissue Pre Debridement: Fat layer exposed ?Level of Consciousness (Pre-procedure): Awake and Alert ?Pre-procedure Verification/Time Out Yes - 09:55 ?Taken: ?Start Time: 09:56 ?Pain Control: ?Other : Benzocaine ?T Area Debrided (L x W): ?otal 1 (cm) x 0.6 (cm) = 0.6 (cm?) ?Tissue and other material debrided: Non-Viable, Slough, Subcutaneous, Slough ?Level: Skin/Subcutaneous Tissue ?Debridement Description: Excisional ?Instrument: Curette ?Bleeding: Minimum ?Hemostasis Achieved: Pressure ?End Time: 10:02 ?Response to Treatment: Procedure was tolerated well ?Level of Consciousness (Post- Awake and Alert ?procedure): ?Post Debridement Measurements of Total  Wound ?Length: (cm) 1 ?Width: (cm) 0.6 ?Depth: (cm) 0.2 ?Volume: (cm?) 0.094 ?Character of Wound/Ulcer Post Debridement: Stable ?Severity of Tissue Post Debridement: Fat layer exposed ?Post Procedure Diagnosis ?Same as Pre-procedure ?Electronic Signature(s) ?Signed: 08/07/2021 10:19:48 AM By: Geralyn CorwinHoffman, Clydell Sposito DO ?Signed: 08/07/2021 4:30:08 PM By: Antonieta IbaBarnhart, [name] ?Entered By: Antonieta IbaBarnhart, [name] on 08/07/2021 10:02:48 ?-------------------------------------------------------------------------------- ?HPI Details ?Patient Name: Date of Service: ?Kurt, [name] 08/07/2021 9:30 A M ?Medical Record Number: [id number] ?Patient Account Number: [id number] ?Date of Birth/Sex: Treating RN: ?02/13/1981 (41 y.o. Lytle MichaelsM) Barnhart, [name] ?Primary Care Provider: PCP, NO Other Clinician: ?Referring Provider: ?Treating Provider/Extender: Geralyn CorwinHoffman, Lechelle Wrigley ?Weeks in Treatment: 1 ?History of Present Illness ?HPI Description: Admission 07/31/2021 ?Mr. Anthony PoliDaaron [name] is a 41 year old male with a past medical history of sarcoidosis, obstructive sleep apnea and morbid obesity that presents to the clinic for ?a 1 year history of wounds to his right lower extremity. He states these wax and wane in size. He states that it currently does not drain but has weeped in the ?past. He reports taking amoxicillin last month and this helped improve the wound size and appearance. He does not have compression stockings. He currently ?denies signs of infection. ?3/28; patient presents for follow-up. He tolerated the compression wrap well. He has no issues or complaints today. He denies signs of infection. ?Electronic Signature(s) ?Signed: 08/07/2021 10:19:48 AM By: Geralyn CorwinHoffman, Dally Oshel DO ?Entered By: Geralyn CorwinHoffman, Vu Liebman on 08/07/2021 10:17:06 ?-------------------------------------------------------------------------------- ?Physical Exam Details ?Patient Name: Date of Service: ?Hartwig, [name] 08/07/2021 9:30 A M ?Medical Record Number: [id number] ?Patient Account Number:  [id number] ?Date of Birth/Sex: Treating RN: ?10/16/1980 (41 y.o. Lytle MichaelsM) Barnhart, [name] ?Primary Care Provider: PCP, NO Other Clinician: ?Referring Provider: ?Treating Provider/Extender: Geralyn CorwinHoffman, Aashrith Eves ?Weeks in Treatment: 1 ?Constitutional ?respirations regular, non-labored and within target range for patient.Marland Kitchen. ?Cardiovascular ?2+ dorsalis pedis/posterior tibialis pulses. ?Psychiatric ?pleasant and cooperative. ?Notes ?Right lower extremity: T the medial aspect there is an open wound with granulation tissue and scant nonviable tissue. No surrounding signs of infection. ?o ?  Decent edema control. ?Electronic Signature(s) ?Signed: 08/07/2021 10:19:48 AM By: Geralyn Corwin DO ?Entered By: Geralyn Corwin on 08/07/2021 10:17:45 ?-------------------------------------------------------------------------------- ?Physician Orders Details ?Patient Name: ?Date of Service: ?Canto, [name] 08/07/2021 9:30 A M ?Medical Record Number: [id number] ?Patient Account Number: [id number] ?Date of Birth/Sex: ?Treating RN: ?June 06, 1980 (41 y.o. 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 - with Dr. Mikey Bussing Lennox Laity, Room 7) ?Return A ?Bathing/ Shower/ Hygiene ?May shower with protection but do not get wound dressing(s) wet. - May use cast protector bag. Can get at [facility], [facility] or [employer] ?Edema Control - Lymphedema / SCD / Other ?Avoid standing for long periods of time. ?Wound Treatment ?Wound #1 - Lower Leg Wound Laterality: Right, Medial ?Cleanser: Soap and  Water 1 x Per Week/30 Days ?Discharge Instructions: May shower and wash wound with dial antibacterial soap and water prior to dressing change. ?Cleanser: Wound Cleanser 1 x Per Week/30 Days ?Discharge Instructions: Cleanse the wound with wound cleanser prior to applying a clean dressing using gauze sponges, not tissue or cotton balls. ?Peri-Wound Care: Sween Lotion (Moisturizing lotion) 1 x Per Week/30 Days ?Discharge Instructions: Apply moisturizing lotion as directed ?Topical: Gentamicin 1 x Per Week/30 Days ?Discharge Instructions: As directed by physician ?Prim Dressing: Hydrofera Blue Ready Foam, 2.5 x2.5 in 1 x Per Week/30 Days ?ary ?Discharge Instructions: Apply to wound bed as instructed ?Secondary Dressing: ABD Pad, 5x9 1 x Per Week/30 Days ?Discharge Instructions: Apply over primary dressing as directed. ?Compression Wrap: ThreePress (3 layer compression wrap) 1 x Per Week/30 Days ?Discharge Instructions: Apply three layer compression as directed. ?Compression Wrap: Unnaboot w/Calamine, 4x10 (in/yd) 1 x Per Week/30 Days ?Discharge Instructions: Apply at top to anchor wrap ?Electronic Signature(s) ?Signed: 08/07/2021 10:19:48 AM By: Geralyn Corwin DO ?Entered By: Geralyn Corwin on 08/07/2021 10:18:00 ?-------------------------------------------------------------------------------- ?Problem List Details ?Patient Name: ?Date of Service: ?Haston, [name] 08/07/2021 9:30 A M ?Medical Record Number: [id number] ?Patient Account Number: [id number] ?Date of Birth/Sex: ?Treating RN: ?01/10/1981 (41 y.o. 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

## 2021-08-07 NOTE — Progress Notes (Signed)
Anthony, [name] ([id number]) ?Visit Report for 08/07/2021 ?Arrival Information Details ?Patient Name: Date of Service: ?Anthony [name], Anthony [name]. 08/07/2021 9:30 A M ?Medical Record Number: [id number] ?Patient Account Number: [id number] ?Date of Birth/Sex: Treating RN: ?04/10/1981 (41 y.o. Anthony [name] ?Primary Care Dayani Winbush: PCP, NO Other Clinician: ?Referring Aerial Dilley: ?Treating Takeo Harts/Extender: Geralyn Corwin ?Weeks in Treatment: 1 ?Visit Information History Since Last Visit ?Added or deleted any medications: No ?Patient Arrived: Ambulatory ?Any new allergies or adverse reactions: No ?Arrival Time: 09:42 ?Had a fall or experienced change in No ?Transfer Assistance: None ?activities of daily living that may affect ?Patient Identification Verified: Yes ?risk of falls: ?Secondary Verification Process Completed: Yes ?Signs or symptoms of abuse/neglect since last visito No ?Patient Requires Transmission-Based Precautions: No ?Hospitalized since last visit: No ?Patient Has Alerts: No ?Implantable device outside of the clinic excluding No ?cellular tissue based products placed in the center ?since last visit: ?Has Dressing in Place as Prescribed: Yes ?Has Compression in Place as Prescribed: Yes ?Pain Present Now: No ?Electronic Signature(s) ?Signed: 08/07/2021 4:30:08 PM By: Antonieta Iba ?Entered By: Antonieta Iba on 08/07/2021 09:45:12 ?-------------------------------------------------------------------------------- ?Compression Therapy Details ?Patient Name: Date of Service: ?Anthony [name], Anthony [name]. 08/07/2021 9:30 A M ?Medical Record Number: [id number] ?Patient Account Number: [id number] ?Date of Birth/Sex: Treating RN: ?08-31-1980 (41 y.o. Anthony [name] ?Primary Care Brittony Billick: PCP, NO Other Clinician: ?Referring Sherisse Fullilove: ?Treating Deshara Rossi/Extender: Geralyn Corwin ?Weeks in Treatment: 1 ?Compression Therapy Performed for Wound Assessment: Wound #1 Right,Medial Lower Leg ?Performed By: Clinician Antonieta Iba,  RN ?Compression Type: Three Layer ?Post Procedure Diagnosis ?Same as Pre-procedure ?Electronic Signature(s) ?Signed: 08/07/2021 4:30:08 PM By: Antonieta Iba ?Entered By: Antonieta Iba on 08/07/2021 10:01:01 ?-------------------------------------------------------------------------------- ?Encounter Discharge Information Details ?Patient Name: ?Date of Service: ?Anthony, Anthony [name]. 08/07/2021 9:30 A M ?Medical Record Number: [id number] ?Patient Account Number: [id number] ?Date of Birth/Sex: ?Treating RN: ?[birth year] (41 y.o. Anthony [name] ?Primary Care Freddrick Gladson: PCP, NO ?Other Clinician: ?Referring Maliha Outten: ?Treating Habib Kise/Extender: Geralyn Corwin ?Weeks in Treatment: 1 ?Encounter Discharge Information Items Post Procedure Vitals ?Discharge Condition: Stable ?Temperature (F): 98.3 ?Ambulatory Status: Ambulatory ?Pulse (bpm): 83 ?Discharge Destination: Home ?Respiratory Rate (breaths/min): 20 ?Transportation: Private Auto ?Blood Pressure (mmHg): 192/78 ?Schedule Follow-up Appointment: Yes ?Clinical Summary of Care: Provided on 08/07/2021 ?Form Type Recipient ?Paper Patient Patient ?Electronic Signature(s) ?Signed: 08/07/2021 4:30:08 PM By: Antonieta Iba ?Entered By: Antonieta Iba on 08/07/2021 10:21:03 ?-------------------------------------------------------------------------------- ?Lower Extremity Assessment Details ?Patient Name: ?Date of Service: ?Anthony [name], Anthony [name]. 08/07/2021 9:30 A M ?Medical Record Number: [id number] ?Patient Account Number: [id number] ?Date of Birth/Sex: ?Treating RN: ?[birth year] (41 y.o. Anthony [name] ?Primary Care Yenny Kosa: PCP, NO ?Other Clinician: ?Referring Vignesh Willert: ?Treating Ayline Dingus/Extender: Geralyn Corwin ?Weeks in Treatment: 1 ?Edema Assessment ?Assessed: [Left: No] [Right: Yes] ?Edema: [Left: Ye] [Right: s] ?Calf ?Left: Right: ?Point of Measurement: 29 cm From Medial Instep 60 cm ?Ankle ?Left: Right: ?Point of Measurement: 11 cm From Medial Instep 33.5 cm ?Vascular  Assessment ?Pulses: ?Dorsalis Pedis ?Palpable: [Right:Yes] ?Electronic Signature(s) ?Signed: 08/07/2021 4:30:08 PM By: Antonieta Iba ?Entered By: Antonieta Iba on 08/07/2021 09:47:54 ?-------------------------------------------------------------------------------- ?Multi Wound Chart Details ?Patient Name: ?Date of Service: ?Anthony, Anthony [name]. 08/07/2021 9:30 A M ?Medical Record Number: [id number] ?Patient Account Number: [id number] ?Date of Birth/Sex: ?Treating RN: ?08-13-1980 (41 y.o. Anthony [name] ?Primary Care Traeger Sultana: PCP, NO ?Other Clinician: ?Referring Deyanira Fesler: ?Treating Abbas Beyene/Extender: Geralyn Corwin ?Weeks in Treatment: 1 ?Vital Signs ?Height(in): 68 ?Pulse(bpm): 83 ?Weight(lbs): 572 ?Blood Pressure(mmHg): 192/78 ?Body  Mass Index(BMI): 87 ?Temperature(??F): 98.3 ?Respiratory Rate(breaths/min): 20 ?Photos: [N/A:N/A] ?Right, Medial Lower Leg N/A N/A ?Wound Location: ?Other Lesion N/A N/A ?Wounding Event: ?Venous Leg Ulcer N/A N/A ?Primary Etiology: ?Sleep Apnea, Rheumatoid Arthritis, N/A N/A ?Comorbid History: ?Osteoarthritis ?08/24/2020 N/A N/A ?Date Acquired: ?1 N/A N/A ?Weeks of Treatment: ?Open N/A N/A ?Wound Status: ?No N/A N/A ?Wound Recurrence: ?1x0.6x0.2 N/A N/A ?Measurements L x W x D (cm) ?0.471 N/A N/A ?A (cm?) : ?rea ?0.094 N/A N/A ?Volume (cm?) : ?0.00% N/A N/A ?% Reduction in A rea: ?33.30% N/A N/A ?% Reduction in Volume: ?Full Thickness Without Exposed N/A N/A ?Classification: ?Support Structures ?Medium N/A N/A ?Exudate A mount: ?Serosanguineous N/A N/A ?Exudate Type: ?red, brown N/A N/A ?Exudate Color: ?Distinct, outline attached N/A N/A ?Wound Margin: ?Large (67-100%) N/A N/A ?Granulation A mount: ?Red N/A N/A ?Granulation Quality: ?Small (1-33%) N/A N/A ?Necrotic A mount: ?Fat Layer (Subcutaneous Tissue): Yes N/A N/A ?Exposed Structures: ?Fascia: No ?Tendon: No ?Muscle: No ?Joint: No ?Bone: No ?None N/A N/A ?Epithelialization: ?Debridement - Excisional N/A  N/A ?Debridement: ?Pre-procedure Verification/Time Out 09:55 N/A N/A ?Taken: ?Other N/A N/A ?Pain Control: ?Subcutaneous, Slough N/A N/A ?Tissue Debrided: ?Skin/Subcutaneous Tissue N/A N/A ?Level: ?0.6 N/A N/A ?Debridement A (sq cm): ?rea ?Curette N/A N/A ?Instrument: ?Minimum N/A N/A ?Bleeding: ?Pressure N/A N/A ?Hemostasis A chieved: ?Procedure was tolerated well N/A N/A ?Debridement Treatment Response: ?1x0.6x0.2 N/A N/A ?Post Debridement Measurements L x ?W x D (cm) ?0.094 N/A N/A ?Post Debridement Volume: (cm?) ?Compression Therapy N/A N/A ?Procedures Performed: ?Debridement ?Treatment Notes ?Electronic Signature(s) ?Signed: 08/07/2021 10:19:48 AM By: Geralyn CorwinHoffman, Jessica DO ?Signed: 08/07/2021 4:30:08 PM By: Antonieta IbaBarnhart, [name] ?Entered By: Geralyn CorwinHoffman, Jessica on 08/07/2021 10:16:11 ?-------------------------------------------------------------------------------- ?Multi-Disciplinary Care Plan Details ?Patient Name: ?Date of Service: ?, Anthony [name]. 08/07/2021 9:30 A M ?Medical Record Number: [id number] ?Patient Account Number: [id number] ?Date of Birth/Sex: ?Treating RN: ?05/27/1980 (41 y.o. Anthony MichaelsM) Barnhart, [name] ?Primary Care Naliya Gish: PCP, NO ?Other Clinician: ?Referring Franck Vinal: ?Treating Fin Hupp/Extender: Geralyn CorwinHoffman, Jessica ?Weeks in Treatment: 1 ?Active Inactive ?Venous Leg Ulcer ?Nursing Diagnoses: ?Actual venous Insuffiency (use after diagnosis is confirmed) ?Goals: ?Patient will maintain optimal edema control ?Date Initiated: 07/31/2021 ?Target Resolution Date: 08/28/2021 ?Goal Status: Active ?Interventions: ?Compression as ordered ?Provide education on venous insufficiency ?Treatment Activities: ?Therapeutic compression applied : 07/31/2021 ?Notes: ?Wound/Skin Impairment ?Nursing Diagnoses: ?Impaired tissue integrity ?Goals: ?Patient/caregiver will verbalize understanding of skin care regimen ?Date Initiated: 07/31/2021 ?Target Resolution Date: 08/28/2021 ?Goal Status: Active ?Ulcer/skin breakdown will have a volume  reduction of 30% by week 4 ?Date Initiated: 07/31/2021 ?Target Resolution Date: 08/28/2021 ?Goal Status: Active ?Interventions: ?Assess patient/caregiver ability to obtain necessary supplies ?Assess patient/caregiver ability to perform ulc

## 2021-08-08 ENCOUNTER — Encounter (HOSPITAL_BASED_OUTPATIENT_CLINIC_OR_DEPARTMENT_OTHER): Payer: Managed Care, Other (non HMO) | Admitting: Internal Medicine

## 2021-08-14 ENCOUNTER — Encounter (HOSPITAL_BASED_OUTPATIENT_CLINIC_OR_DEPARTMENT_OTHER): Payer: Commercial Managed Care - HMO | Attending: Internal Medicine | Admitting: Internal Medicine

## 2021-08-14 DIAGNOSIS — M069 Rheumatoid arthritis, unspecified: Secondary | ICD-10-CM | POA: Insufficient documentation

## 2021-08-14 DIAGNOSIS — D862 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: Secondary | ICD-10-CM | POA: Insufficient documentation

## 2021-08-14 DIAGNOSIS — I87331 Chronic venous hypertension (idiopathic) with ulcer and inflammation of right lower extremity: Secondary | ICD-10-CM | POA: Diagnosis not present

## 2021-08-14 DIAGNOSIS — L97812 Non-pressure chronic ulcer of other part of right lower leg with fat layer exposed: Secondary | ICD-10-CM | POA: Diagnosis not present

## 2021-08-14 NOTE — Progress Notes (Signed)
Shella Spearing,  J. ([id number]) ?Visit Report for 08/14/2021 ?Arrival Information Details ?Patient Name: Date of Service: ?, [name] 08/14/2021 10:15 A M ?Medical Record Number: [id number] ?Patient Account Number: [id number] ?Date of Birth/Sex: Treating RN: ?05/09/1981 (41 y.o. Lytle MichaelsM) Barnhart, [name] ?Primary Care Elleigh Cassetta: PCP, NO Other Clinician: ?Referring Lyam Provencio: ?Treating Sharley Keeler/Extender: Geralyn CorwinHoffman, Jessica ?Weeks in Treatment: 2 ?Visit Information History Since Last Visit ?Added or deleted any medications: No ?Patient Arrived: Ambulatory ?Any new allergies or adverse reactions: No ?Arrival Time: 10:22 ?Had a fall or experienced change in No ?Transfer Assistance: None ?activities of daily living that may affect ?Patient Identification Verified: Yes ?risk of falls: ?Secondary Verification Process Completed: Yes ?Signs or symptoms of abuse/neglect since last visito No ?Patient Requires Transmission-Based Precautions: No ?Hospitalized since last visit: No ?Patient Has Alerts: No ?Implantable device outside of the clinic excluding No ?cellular tissue based products placed in the center ?since last visit: ?Has Dressing in Place as Prescribed: Yes ?Has Compression in Place as Prescribed: Yes ?Pain Present Now: No ?Electronic Signature(s) ?Signed: 08/14/2021 4:45:45 PM By: Antonieta IbaBarnhart, [name] ?Entered By: Antonieta IbaBarnhart, [name] on 08/14/2021 10:25:33 ?-------------------------------------------------------------------------------- ?Compression Therapy Details ?Patient Name: Date of Service: ?, [name] 08/14/2021 10:15 A M ?Medical Record Number: [id number] ?Patient Account Number: [id number] ?Date of Birth/Sex: Treating RN: ?09/02/1980 (41 y.o. Lytle MichaelsM) Barnhart, [name] ?Primary Care Juniper Snyders: PCP, NO Other Clinician: ?Referring Owain Eckerman: ?Treating Chena Chohan/Extender: Geralyn CorwinHoffman, Jessica ?Weeks in Treatment: 2 ?Compression Therapy Performed for Wound Assessment: Wound #1 Right,Medial Lower Leg ?Performed By: Clinician Antonieta IbaBarnhart, [name],  RN ?Compression Type: Three Layer ?Post Procedure Diagnosis ?Same as Pre-procedure ?Electronic Signature(s) ?Signed: 08/14/2021 4:45:45 PM By: Antonieta IbaBarnhart, [name] ?Entered By: Antonieta IbaBarnhart, [name] on 08/14/2021 10:49:30 ?-------------------------------------------------------------------------------- ?Encounter Discharge Information Details ?Patient Name: ?Date of Service: ?, [name] 08/14/2021 10:15 A M ?Medical Record Number: [id number] ?Patient Account Number: [id number] ?Date of Birth/Sex: ?Treating RN: ?06/02/1980 (41 y.o. Lytle MichaelsM) Barnhart, [name] ?Primary Care Izreal Kock: PCP, NO ?Other Clinician: ?Referring Alicia Ackert: ?Treating Clance Baquero/Extender: Geralyn CorwinHoffman, Jessica ?Weeks in Treatment: 2 ?Encounter Discharge Information Items Post Procedure Vitals ?Discharge Condition: Stable ?Temperature (F): 98.9 ?Ambulatory Status: Ambulatory ?Pulse (bpm): 84 ?Discharge Destination: Home ?Respiratory Rate (breaths/min): 20 ?Transportation: Private Auto ?Blood Pressure (mmHg): 160/89 ?Schedule Follow-up Appointment: Yes ?Clinical Summary of Care: Provided on 08/14/2021 ?Form Type Recipient ?Paper Patient Patient ?Electronic Signature(s) ?Signed: 08/14/2021 4:45:45 PM By: Antonieta IbaBarnhart, [name] ?Entered By: Antonieta IbaBarnhart, [name] on 08/14/2021 11:14:21 ?-------------------------------------------------------------------------------- ?Lower Extremity Assessment Details ?Patient Name: ?Date of Service: ?, [name] 08/14/2021 10:15 A M ?Medical Record Number: [id number] ?Patient Account Number: [id number] ?Date of Birth/Sex: ?Treating RN: ?09/02/1980 (41 y.o. Lytle MichaelsM) Barnhart, [name] ?Primary Care Tinslee Klare: PCP, NO ?Other Clinician: ?Referring Teniqua Marron: ?Treating Hosteen Kienast/Extender: Geralyn CorwinHoffman, Jessica ?Weeks in Treatment: 2 ?Edema Assessment ?Assessed: [Left: No] [Right: Yes] ?Edema: [Left: Ye] [Right: s] ?Calf ?Left: Right: ?Point of Measurement: 29 cm From Medial Instep 61 cm ?Ankle ?Left: Right: ?Point of Measurement: 11 cm From Medial Instep 31 cm ?Vascular  Assessment ?Pulses: ?Dorsalis Pedis ?Palpable: [Right:Yes] ?Electronic Signature(s) ?Signed: 08/14/2021 4:45:45 PM By: Antonieta IbaBarnhart, [name] ?Entered By: Antonieta IbaBarnhart, [name] on 08/14/2021 10:34:09 ?-------------------------------------------------------------------------------- ?Multi Wound Chart Details ?Patient Name: ?Date of Service: ?, [name] 08/14/2021 10:15 A M ?Medical Record Number: [id number] ?Patient Account Number: [id number] ?Date of Birth/Sex: ?Treating RN: ?09/24/1980 (41 y.o. Lytle MichaelsM) Barnhart, [name] ?Primary Care Kendle Erker: PCP, NO ?Other Clinician: ?Referring Reia Viernes: ?Treating Deonne Rooks/Extender: Geralyn CorwinHoffman, Jessica ?Weeks in Treatment: 2 ?Vital Signs ?Height(in): 68 ?Pulse(bpm): 84 ?Weight(lbs): 572 ?Blood Pressure(mmHg): 160/89 ?Body  Mass Index(BMI): 87 ?Temperature(??F): 98.9 ?Respiratory Rate(breaths/min): 20 ?Photos: [N/A:N/A] ?Right, Medial Lower Leg N/A N/A ?Wound Location: ?Other Lesion N/A N/A ?Wounding Event: ?Venous Leg Ulcer N/A N/A ?Primary Etiology: ?Sleep Apnea, Rheumatoid Arthritis, N/A N/A ?Comorbid History: ?Osteoarthritis ?08/24/2020 N/A N/A ?Date Acquired: ?2 N/A N/A ?Weeks of Treatment: ?Open N/A N/A ?Wound Status: ?No N/A N/A ?Wound Recurrence: ?0.9x0.5x0.1 N/A N/A ?Measurements L x W x D (cm) ?0.353 N/A N/A ?A (cm?) : ?rea ?0.035 N/A N/A ?Volume (cm?) : ?25.10% N/A N/A ?% Reduction in A rea: ?75.20% N/A N/A ?% Reduction in Volume: ?Full Thickness Without Exposed N/A N/A ?Classification: ?Support Structures ?Medium N/A N/A ?Exudate A mount: ?Serosanguineous N/A N/A ?Exudate Type: ?red, brown N/A N/A ?Exudate Color: ?Distinct, outline attached N/A N/A ?Wound Margin: ?Large (67-100%) N/A N/A ?Granulation A mount: ?Red N/A N/A ?Granulation Quality: ?Small (1-33%) N/A N/A ?Necrotic A mount: ?Fat Layer (Subcutaneous Tissue): Yes N/A N/A ?Exposed Structures: ?Fascia: No ?Tendon: No ?Muscle: No ?Joint: No ?Bone: No ?Medium (34-66%) N/A N/A ?Epithelialization: ?Debridement - Excisional N/A  N/A ?Debridement: ?Pre-procedure Verification/Time Out 10:44 N/A N/A ?Taken: ?Other N/A N/A ?Pain Control: ?Subcutaneous, Slough N/A N/A ?Tissue Debrided: ?Skin/Subcutaneous Tissue N/A N/A ?Level: ?0.45 N/A N/A ?Debridement A (sq cm): ?rea ?Curette N/A N/A ?Instrument: ?Minimum N/A N/A ?Bleeding: ?Pressure N/A N/A ?Hemostasis A chieved: ?Procedure was tolerated well N/A N/A ?Debridement Treatment Response: ?0.9x0.5x0.1 N/A N/A ?Post Debridement Measurements L x ?W x D (cm) ?0.035 N/A N/A ?Post Debridement Volume: (cm?) ?Compression Therapy N/A N/A ?Procedures Performed: ?Debridement ?Treatment Notes ?Electronic Signature(s) ?Signed: 08/14/2021 11:49:02 AM By: Geralyn Corwin DO ?Signed: 08/14/2021 4:45:45 PM By: Antonieta Iba ?Entered By: Geralyn Corwin on 08/14/2021 11:12:05 ?-------------------------------------------------------------------------------- ?Multi-Disciplinary Care Plan Details ?Patient Name: ?Date of Service: ?Thissen, [name] 08/14/2021 10:15 A M ?Medical Record Number: [id number] ?Patient Account Number: [id number] ?Date of Birth/Sex: ?Treating RN: ?02/04/1981 (41 y.o. [name] ?Primary Care Milayna Rotenberg: PCP, NO ?Other Clinician: ?Referring Alvie Speltz: ?Treating Cumi Sanagustin/Extender: Geralyn Corwin ?Weeks in Treatment: 2 ?Active Inactive ?Venous Leg Ulcer ?Nursing Diagnoses: ?Actual venous Insuffiency (use after diagnosis is confirmed) ?Goals: ?Patient will maintain optimal edema control ?Date Initiated: 07/31/2021 ?Target Resolution Date: 08/28/2021 ?Goal Status: Active ?Interventions: ?Compression as ordered ?Provide education on venous insufficiency ?Treatment Activities: ?Therapeutic compression applied : 07/31/2021 ?Notes: ?Wound/Skin Impairment ?Nursing Diagnoses: ?Impaired tissue integrity ?Goals: ?Patient/caregiver will verbalize understanding of skin care regimen ?Date Initiated: 07/31/2021 ?Target Resolution Date: 08/28/2021 ?Goal Status: Active ?Ulcer/skin breakdown will have a volume  reduction of 30% by week 4 ?Date Initiated: 07/31/2021 ?Target Resolution Date: 08/28/2021 ?Goal Status: Active ?Interventions: ?Assess patient/caregiver ability to obtain necessary supplies ?Assess patient/caregiver ability to perf

## 2021-08-14 NOTE — Progress Notes (Signed)
JAMEE, PACHOLSKI ([id number]) ?Visit Report for 08/14/2021 ?Chief Complaint Document Details ?Patient Name: Date of Service: ?Anthony [name], Anthony [name]. 08/14/2021 10:15 A M ?Medical Record Number: [id number] ?Patient Account Number: [id number] ?Date of Birth/Sex: Treating RN: ?07-07-1980 (41 y.o. Anthony [name] ?Primary Care Provider: PCP, NO Other Clinician: ?Referring Provider: ?Treating Provider/Extender: Geralyn Corwin ?Weeks in Treatment: 2 ?Information Obtained from: Patient ?Chief Complaint ?Right lower extremity wound ?Electronic Signature(s) ?Signed: 08/14/2021 11:49:02 AM By: Geralyn Corwin DO ?Entered By: Geralyn Corwin on 08/14/2021 11:12:50 ?-------------------------------------------------------------------------------- ?Debridement Details ?Patient Name: Date of Service: ?Anthony [name], Anthony [name]. 08/14/2021 10:15 A M ?Medical Record Number: [id number] ?Patient Account Number: [id number] ?Date of Birth/Sex: Treating RN: ?04-Jul-1980 (41 y.o. Anthony [name] ?Primary Care Provider: PCP, NO Other Clinician: ?Referring Provider: ?Treating Provider/Extender: Geralyn Corwin ?Weeks in Treatment: 2 ?Debridement Performed for Assessment: Wound #1 Right,Medial Lower Leg ?Performed By: Physician Geralyn Corwin, DO ?Debridement Type: Debridement ?Severity of Tissue Pre Debridement: Fat layer exposed ?Level of Consciousness (Pre-procedure): Awake and Alert ?Pre-procedure Verification/Time Out Yes - 10:44 ?Taken: ?Start Time: 10:45 ?Pain Control: ?Other : Benzocaine ?T Area Debrided (L x W): ?otal 0.9 (cm) x 0.5 (cm) = 0.45 (cm?) ?Tissue and other material debrided: ?Non-Viable, Slough, Subcutaneous, Skin: Dermis , Slough ?Level: Skin/Subcutaneous Tissue ?Debridement Description: Excisional ?Instrument: Curette ?Bleeding: Minimum ?Hemostasis Achieved: Pressure ?End Time: 10:48 ?Response to Treatment: Procedure was tolerated well ?Level of Consciousness (Post- Awake and Alert ?procedure): ?Post Debridement Measurements of  Total Wound ?Length: (cm) 0.9 ?Width: (cm) 0.5 ?Depth: (cm) 0.1 ?Volume: (cm?) 0.035 ?Character of Wound/Ulcer Post Debridement: Stable ?Severity of Tissue Post Debridement: Fat layer exposed ?Post Procedure Diagnosis ?Same as Pre-procedure ?Electronic Signature(s) ?Signed: 08/14/2021 11:49:02 AM By: Geralyn Corwin DO ?Signed: 08/14/2021 4:45:45 PM By: Antonieta Iba ?Entered By: Antonieta Iba on 08/14/2021 10:49:00 ?-------------------------------------------------------------------------------- ?HPI Details ?Patient Name: Date of Service: ?Anthony [name], Anthony [name]. 08/14/2021 10:15 A M ?Medical Record Number: [id number] ?Patient Account Number: [id number] ?Date of Birth/Sex: Treating RN: ?[birth year] (41 y.o. Anthony [name] ?Primary Care Provider: PCP, NO Other Clinician: ?Referring Provider: ?Treating Provider/Extender: Geralyn Corwin ?Weeks in Treatment: 2 ?History of Present Illness ?HPI Description: Admission 07/31/2021 ?Anthony [name] is a 41 year old male with a past medical history of sarcoidosis, obstructive sleep apnea and morbid obesity that presents to the clinic for ?a 1 year history of wounds to his right lower extremity. He states these wax and wane in size. He states that it currently does not drain but has weeped in the ?past. He reports taking amoxicillin last month and this helped improve the wound size and appearance. He does not have compression stockings. He currently ?denies signs of infection. ?3/28; patient presents for follow-up. He tolerated the compression wrap well. He has no issues or complaints today. He denies signs of infection. ?4/4; patient presents for follow-up. He has no issues or complaints today. ?Electronic Signature(s) ?Signed: 08/14/2021 11:49:02 AM By: Geralyn Corwin DO ?Entered By: Geralyn Corwin on 08/14/2021 11:13:28 ?-------------------------------------------------------------------------------- ?Physical Exam Details ?Patient Name: Date of Service: ?Anthony [name], Anthony A RO N  [name]. 08/14/2021 10:15 A M ?Medical Record Number: [id number] ?Patient Account Number: [id number] ?Date of Birth/Sex: Treating RN: ?1980-11-11 (41 y.o. Anthony [name] ?Primary Care Provider: PCP, NO Other Clinician: ?Referring Provider: ?Treating Provider/Extender: Geralyn Corwin ?Weeks in Treatment: 2 ?Constitutional ?respirations regular, non-labored and within target range for patient.Marland Kitchen ?Cardiovascular ?2+ dorsalis pedis/posterior tibialis pulses. ?Psychiatric ?pleasant and cooperative. ?Notes ?Right lower extremity: T the medial aspect there is an  open wound with granulation tissue and scant nonviable tissue. No surrounding signs of infection. ?o ?Decent edema control. ?Electronic Signature(s) ?Signed: 08/14/2021 11:49:02 AM By: Geralyn CorwinHoffman, Angell Honse DO ?Signed: 08/14/2021 11:49:02 AM By: Geralyn CorwinHoffman, Abigayle Wilinski DO ?Entered By: Geralyn CorwinHoffman, Javen Ridings on 08/14/2021 11:14:11 ?-------------------------------------------------------------------------------- ?Physician Orders Details ?Patient Name: ?Date of Service: ?Anthony [name], Anthony [name]. 08/14/2021 10:15 A M ?Medical Record Number: [id number] ?Patient Account Number: [id number] ?Date of Birth/Sex: ?Treating RN: ?11/29/1980 (41 y.o. 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 - with Dr. Mikey BussingHoffman Lennox Laity(Anthony, Room 7) ?Return A ?Bathing/ Shower/ Hygiene ?May shower with protection but do not get wound dressing(s) wet. - May use cast protector bag. Can get at [facility], [facility] or [employer] ?Edema Control - Lymphedema / SCD /  Other ?Avoid standing for long periods of time. ?Wound Treatment ?Wound #1 - Lower Leg Wound Laterality: Right, Medial ?Cleanser: Soap and Water 1 x Per Week/30 Days ?Discharge Instructions: May shower and wash wound with dial antibacterial soap and water prior to dressing change. ?Cleanser: Wound Cleanser 1 x Per Week/30 Days ?Discharge Instructions: Cleanse the wound with wound cleanser prior to applying a clean dressing using gauze sponges, not tissue or cotton balls. ?Peri-Wound Care: Sween Lotion (Moisturizing lotion) 1 x Per Week/30 Days ?Discharge Instructions: Apply moisturizing lotion as directed ?Prim Dressing: Hydrofera Blue Ready Foam, 2.5 x2.5 in 1 x Per Week/30 Days ?ary ?Discharge Instructions: Apply to wound bed as instructed ?Secondary Dressing: ABD Pad, 5x9 1 x Per Week/30 Days ?Discharge Instructions: Apply over primary dressing as directed. ?Compression Wrap: ThreePress (3 layer compression wrap) 1 x Per Week/30 Days ?Discharge Instructions: Apply three layer compression as directed. ?Compression Wrap: Unnaboot w/Calamine, 4x10 (in/yd) 1 x Per Week/30 Days ?Discharge Instructions: Apply at top to anchor wrap ?Electronic Signature(s) ?Signed: 08/14/2021 11:49:02 AM By: Geralyn CorwinHoffman, Susan Bleich DO ?Entered By: Geralyn CorwinHoffman, Giulio Bertino on 08/14/2021 11:15:53 ?-------------------------------------------------------------------------------- ?Problem List Details ?Patient Name: ?Date of Service: ?Anthony [name], Anthony [name]. 08/14/2021 10:15 A M ?Medical Record Number: [id number] ?Patient Account Number: [id number] ?Date of Birth/Sex: ?Treating RN: ?07/17/1980 (41 y.o. 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

## 2021-08-21 ENCOUNTER — Encounter (HOSPITAL_BASED_OUTPATIENT_CLINIC_OR_DEPARTMENT_OTHER): Payer: Commercial Managed Care - HMO | Admitting: Internal Medicine

## 2021-08-21 NOTE — Progress Notes (Signed)
GERMAN, CHAKRABORTY ([id number]) ?Visit Report for 08/21/2021 ?HPI Details ?Patient Name: Date of Service: ?Geis, [name] 08/21/2021 10:15 A M ?Medical Record Number: [id number] ?Patient Account Number: [id number] ?Date of Birth/Sex: Treating RN: ?[birth year] (41 y.o. Marcheta Grammes ?Primary Care Provider: PCP, NO Other Clinician: ?Referring Provider: ?Treating Provider/Extender: Linton Ham ?Weeks in Treatment: 3 ?History of Present Illness ?HPI Description: Admission 07/31/2021 ?Mr. Jacy Krukowski is a 41 year old male with a past medical history of sarcoidosis, obstructive sleep apnea and morbid obesity that presents to the clinic for ?a 1 year history of wounds to his right lower extremity. He states these wax and wane in size. He states that it currently does not drain but has weeped in the ?past. He reports taking amoxicillin last month and this helped improve the wound size and appearance. He does not have compression stockings. He currently ?denies signs of infection. ?3/28; patient presents for follow-up. He tolerated the compression wrap well. He has no issues or complaints today. He denies signs of infection. ?4/4; patient presents for follow-up. He has no issues or complaints ?4/11; 1 week follow-up. The patient appears to be doing very well with the wound on the right medial lower leg. We are using Hydrofera Blue and 3 layer ?compression. The patient has sarcoidosis and is on chronic prednisone he is convinced that both of these have caused this wound although it certainly looks ?like this is chronic venous insufficiency with developing lymphedema.He has never worn compression stockings. ?Electronic Signature(s) ?Signed: 08/21/2021 4:12:10 PM By: Linton Ham MD ?Entered By: Linton Ham on 08/21/2021 11:18:38 ?-------------------------------------------------------------------------------- ?Physical Exam Details ?Patient Name: Date of Service: ?Rembold, [name] 08/21/2021 10:15 A  M ?Medical Record Number: [id number] ?Patient Account Number: [id number] ?Date of Birth/Sex: Treating RN: ?[birth year] (41 y.o. Marcheta Grammes ?Primary Care Provider: PCP, NO Other Clinician: ?Referring Provider: ?Treating Provider/Extender: Linton Ham ?Weeks in Treatment: 3 ?Constitutional ?Patient is hypertensive.. Pulse regular and within target range for patient.Marland Kitchen Respirations regular, non-labored and within target range.. Temperature is normal and ?within the target range for the patient.Marland Kitchen Appears in no distress. ?Cardiovascular ?Pedal pulses are palpable. ?Notes ?Wound exam; right medial lower extremity. Under illumination a healthy looking wound. Very small. May be healed by next week. ?Electronic Signature(s) ?Signed: 08/21/2021 4:12:10 PM By: Linton Ham MD ?Entered By: Linton Ham on 08/21/2021 11:20:53 ?-------------------------------------------------------------------------------- ?Physician Orders Details ?Patient Name: ?Date of Service: ?Harrower, [name] 08/21/2021 10:15 A M ?Medical Record Number: [id number] ?Patient Account Number: [id number] ?Date of Birth/Sex: ?Treating RN: ?12-04-1980 (41 y.o. Marcheta Grammes ?Primary Care Provider: PCP, NO ?Other Clinician: ?Referring Provider: ?Treating Provider/Extender: Linton Ham ?Weeks in Treatment: 3 ?Verbal / Phone Orders: No ?Diagnosis Coding ?Follow-up Appointments ?ppointment in 1 week. - with Dr. Heber Bingen Leveda Anna, Room 7) ?Return A ?Bathing/ Shower/ Hygiene ?May shower with protection but do not get wound dressing(s) wet. - May use cast protector bag. Can get at [facility] or [place] ?Edema Control - Lymphedema / SCD / Other ?Avoid standing for long periods of time. ?Other Edema Control Orders/Instructions: - Information given for [employer] for compression stockings. ?Wound Treatment ?Wound #1 - Lower Leg Wound Laterality: Right, Medial ?Cleanser: Soap and Water 1 x Per Week/30 Days ?Discharge Instructions: May shower and wash  wound with dial antibacterial soap and water prior to dressing change. ?Cleanser: Wound Cleanser 1 x Per Week/30 Days ?Discharge Instructions: Cleanse the wound with wound cleanser prior to applying a clean dressing using  gauze sponges, not tissue or cotton balls. ?Peri-Wound Care: Sween Lotion (Moisturizing lotion) 1 x Per Week/30 Days ?Discharge Instructions: Apply moisturizing lotion as directed ?Prim Dressing: Hydrofera Blue Ready Foam, 2.5 x2.5 in 1 x Per Week/30 Days ?ary ?Discharge Instructions: Apply to wound bed as instructed ?Secondary Dressing: ABD Pad, 5x9 1 x Per Week/30 Days ?Discharge Instructions: Apply over primary dressing as directed. ?Compression Wrap: ThreePress (3 layer compression wrap) 1 x Per Week/30 Days ?Discharge Instructions: Apply three layer compression as directed. ?Compression Wrap: Unnaboot w/Calamine, 4x10 (in/yd) 1 x Per Week/30 Days ?Discharge Instructions: Apply at top to anchor wrap ?Electronic Signature(s) ?Signed: 08/21/2021 4:12:10 PM By: Linton Ham MD ?Signed: 08/21/2021 4:54:57 PM By: Lorrin Jackson ?Entered By: Lorrin Jackson on 08/21/2021 11:11:05 ?-------------------------------------------------------------------------------- ?Problem List Details ?Patient Name: ?Date of Service: ?Poblano, [name] 08/21/2021 10:15 A M ?Medical Record Number: [id number] ?Patient Account Number: [id number] ?Date of Birth/Sex: ?Treating RN: ?03-Jul-1980 (41 y.o. 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 08/21/2021 10:15 A M ?Medical Record Number: [id number] ?Patient Account Number: [id number] ?Date of Birth/Sex: Treating RN: ?[birth year] (41 y.o. Marcheta Grammes ?Primary Care Provider: PCP, NO Other Clinician: ?Referring Provider: ?Treating Provider/Extender: Linton Ham ?Weeks in Treatment: 3 ?Subjective ?History of Present Illness (HPI) ?Admission 07/31/2021 ?Mr. Devontay Gerstenberger is a 41 year old male with a past medical history of sarcoidosis, obstructive sleep apnea and morbid obesity that presents to the clinic for ?a 1 year history of wounds to his right lower extremity. He states these wax and wane in size. He states that it currently does not drain but has weeped in the ?past. He reports taking amoxicillin last month and this helped improve the wound size and appearance. He does not have compression stockings. He currently ?denies signs of infection. ?3/28; patient presents for follow-up. He tolerated the compression wrap well. He has no issues or complaints today. He denies signs of infection. ?4/4; patient presents for follow-up. He has no issues or complaints ?4/11; 1 week follow-up. The patient appears to be doing very well with the wound on the right medial lower leg. We are using Hydrofera Blue and 3 layer ?compression. The patient has sarcoidosis and is on chronic prednisone he is convinced that both of these have caused this wound although it certainly looks ?like this is chronic venous insufficiency with developing lymphedema.He has never worn compression stockings. ?Objective ?Constitutional ?Patient is hypertensive.. Pulse regular and within target range for  patient.Marland Kitchen Respirations regular, non-labored and within target range.. Temperature is normal and ?within the target range for the patient.Marland Kitchen Appears in no distress. ?Vitals Time Taken: 10:25 AM, Height: 68 in, Weight:

## 2021-08-21 NOTE — Progress Notes (Signed)
RAMADAN, CHAPMON ([id number]) ?Visit Report for 08/21/2021 ?Arrival Information Details ?Patient Name: Date of Service: ?Karl, [name] 08/21/2021 10:15 A M ?Medical Record Number: [id number] ?Patient Account Number: [id number] ?Date of Birth/Sex: Treating RN: ?Oct 09, 1980 (41 y.o. M) Rolin Barry, [name] ?Primary Care Ewen Varnell: PCP, NO Other Clinician: ?Referring Riann Oman: ?Treating Santita Hunsberger/Extender: Linton Ham ?Weeks in Treatment: 3 ?Visit Information History Since Last Visit ?Added or deleted any medications: No ?Patient Arrived: Ambulatory ?Any new allergies or adverse reactions: No ?Arrival Time: 10:25 ?Had a fall or experienced change in No ?Accompanied By: self ?activities of daily living that may affect ?Transfer Assistance: None ?risk of falls: ?Patient Identification Verified: Yes ?Signs or symptoms of abuse/neglect since last visito No ?Secondary Verification Process Completed: Yes ?Hospitalized since last visit: No ?Patient Requires Transmission-Based Precautions: No ?Implantable device outside of the clinic excluding No ?Patient Has Alerts: No ?cellular tissue based products placed in the center ?since last visit: ?Has Dressing in Place as Prescribed: Yes ?Has Compression in Place as Prescribed: Yes ?Pain Present Now: No ?Electronic Signature(s) ?Signed: 08/21/2021 4:48:28 PM By: Deon Pilling RN, BSN ?Entered By: Deon Pilling on 08/21/2021 10:26:43 ?-------------------------------------------------------------------------------- ?Compression Therapy Details ?Patient Name: Date of Service: ?Wiacek, [name] 08/21/2021 10:15 A M ?Medical Record Number: [id number] ?Patient Account Number: [id number] ?Date of Birth/Sex: Treating RN: ?1980/05/19 (41 y.o. Marcheta Grammes ?Primary Care Caraline Deutschman: PCP, NO Other Clinician: ?Referring Katerin Negrete: ?Treating Joahan Swatzell/Extender: Linton Ham ?Weeks in Treatment: 3 ?Compression Therapy Performed for Wound Assessment: Wound #1 Right,Medial Lower Leg ?Performed By:  Clinician Lorrin Jackson, RN ?Compression Type: Three Layer ?Post Procedure Diagnosis ?Same as Pre-procedure ?Electronic Signature(s) ?Signed: 08/21/2021 4:54:57 PM By: Lorrin Jackson ?Entered By: Lorrin Jackson on 08/21/2021 10:52:41 ?-------------------------------------------------------------------------------- ?Lower Extremity Assessment Details ?Patient Name: ?Date of Service: ?Loth, [name] 08/21/2021 10:15 A M ?Medical Record Number: [id number] ?Patient Account Number: [id number] ?Date of Birth/Sex: ?Treating RN: ?11/13/1980 (41 y.o. M) Rolin Barry, [name] ?Primary Care Archita Lomeli: PCP, NO ?Other Clinician: ?Referring Sheera Illingworth: ?Treating Tylene Quashie/Extender: Linton Ham ?Weeks in Treatment: 3 ?Edema Assessment ?Assessed: [Left: Yes] [Right: Yes] ?Edema: [Left: Yes] [Right: Yes] ?Calf ?Left: Right: ?Point of Measurement: 29 cm From Medial Instep 60.5 cm 62 cm ?Ankle ?Left: Right: ?Point of Measurement: 11 cm From Medial Instep 32 cm 31 cm ?Knee To Floor ?Left: Right: ?From Medial Instep 43 cm 43 cm ?Vascular Assessment ?Pulses: ?Dorsalis Pedis ?Palpable: [Right:Yes] ?Electronic Signature(s) ?Signed: 08/21/2021 4:48:28 PM By: Deon Pilling RN, BSN ?Signed: 08/21/2021 4:54:57 PM By: Lorrin Jackson ?Entered By: Lorrin Jackson on 08/21/2021 10:58:48 ?-------------------------------------------------------------------------------- ?Multi Wound Chart Details ?Patient Name: ?Date of Service: ?Brazier, [name] 08/21/2021 10:15 A M ?Medical Record Number: [id number] ?Patient Account Number: [id number] ?Date of Birth/Sex: ?Treating RN: ?[birth year] (41 y.o. Marcheta Grammes ?Primary Care Zebadiah Willert: PCP, NO ?Other Clinician: ?Referring Alizza Sacra: ?Treating Daemion Mcniel/Extender: Linton Ham ?Weeks in Treatment: 3 ?Vital Signs ?Height(in): 68 ?Pulse(bpm): 80 ?Weight(lbs): 572 ?Blood Pressure(mmHg): 155/77 ?Body Mass Index(BMI): 87 ?Temperature(??F): 98.7 ?Respiratory Rate(breaths/min): 20 ?Photos: [1:Right, Medial Lower Leg]  [N/A:N/A N/A] ?Wound Location: [1:Other Lesion] [N/A:N/A] ?Wounding Event: [1:Venous Leg Ulcer] [N/A:N/A] ?Primary Etiology: [1:Sleep Apnea, Rheumatoid Arthritis,] [N/A:N/A] ?Comorbid History: [1:Osteoarthritis 08/24/2020] [N/A:N/A] ?Date Acquired: [1:3] [N/A:N/A] ?Weeks of Treatment: [1:Open] [N/A:N/A] ?Wound Status: [1:No] [N/A:N/A] ?Wound Recurrence: [1:0.8x0.4x0.1] [N/A:N/A] ?Measurements L x W x D (cm) [1:0.251] [N/A:N/A] ?A (cm?) : ?rea [1:0.025] [N/A:N/A] ?Volume (cm?) : [1:46.70%] [N/A:N/A] ?% Reduction in Area: [1:82.30%] [N/A:N/A] ?% Reduction in Volume: [1:Full Thickness Without Exposed] [N/A:N/A] ?Classification: [1:Support  Structures Medium] [N/A:N/A] ?Exudate Amount: [1:Serosanguineous] [N/A:N/A] ?Exudate Type: [1:red, brown] [N/A:N/A] ?Exudate Color: [1:Distinct, outline attached] [N/A:N/A] ?Wound Margin: [1:Large (67-100%)] [N/A:N/A] ?Granulation Amount: [1:Red, Pink] [N/A:N/A] ?Granulation Quality: [1:None Present (0%)] [N/A:N/A] ?Necrotic Amount: ?[1:Fat Layer (Subcutaneous Tissue): Yes N/A] ?Exposed Structures: ?[1:Fascia: No Tendon: No Muscle: No Joint: No Bone: No Large (67-100%)] [N/A:N/A] ?Epithelialization: [1:Compression Therapy] [N/A:N/A] ?Treatment Notes ?Electronic Signature(s) ?Signed: 08/21/2021 4:12:10 PM By: Linton Ham MD ?Signed: 08/21/2021 4:54:57 PM By: Lorrin Jackson ?Entered By: Linton Ham on 08/21/2021 11:16:06 ?-------------------------------------------------------------------------------- ?Multi-Disciplinary Care Plan Details ?Patient Name: ?Date of Service: ?Ishmael, [name] 08/21/2021 10:15 A M ?Medical Record Number: [id number] ?Patient Account Number: [id number] ?Date of Birth/Sex: ?Treating RN: ?27-Feb-1981 (41 y.o. Marcheta Grammes ?Primary Care Amado Andal: PCP, NO ?Other Clinician: ?Referring Markiah Janeway: ?Treating Pamela Intrieri/Extender: Linton Ham ?Weeks in Treatment: 3 ?Active Inactive ?Venous Leg Ulcer ?Nursing Diagnoses: ?Actual venous Insuffiency (use after  diagnosis is confirmed) ?Goals: ?Patient will maintain optimal edema control ?Date Initiated: 07/31/2021 ?Target Resolution Date: 08/28/2021 ?Goal Status: Active ?Interventions: ?Compression as ordered ?Provide education on venous insufficiency ?Treatment Activities: ?Therapeutic compression applied : 07/31/2021 ?Notes: ?Wound/Skin Impairment ?Nursing Diagnoses: ?Impaired tissue integrity ?Goals: ?Patient/caregiver will verbalize understanding of skin care regimen ?Date Initiated: 07/31/2021 ?Target Resolution Date: 08/28/2021 ?Goal Status: Active ?Ulcer/skin breakdown will have a volume reduction of 30% by week 4 ?Date Initiated: 07/31/2021 ?Target Resolution Date: 08/28/2021 ?Goal Status: Active ?Interventions: ?Assess patient/caregiver ability to obtain necessary supplies ?Assess patient/caregiver ability to perform ulcer/skin care regimen upon admission and as needed ?Assess ulceration(s) every visit ?Provide education on ulcer and skin care ?Treatment Activities: ?Topical wound management initiated : 07/31/2021 ?Notes: ?Electronic Signature(s) ?Signed: 08/21/2021 4:54:57 PM By: Lorrin Jackson ?Entered By: Lorrin Jackson on 08/21/2021 10:51:41 ?-------------------------------------------------------------------------------- ?Pain Assessment Details ?Patient Name: ?Date of Service: ?[place], [name] 08/21/2021 10:15 A M ?Medical Record Number: [id number] ?Patient Account Number: [id number] ?Date of Birth/Sex: ?Treating RN: ?June 15, 1980 (41 y.o. M) Rolin Barry, [name] ?Primary Care Aprill Banko: PCP, NO ?Other Clinician: ?Referring Keithan Dileonardo: ?Treating Nial Hawe/Extender: Linton Ham ?Weeks in Treatment: 3 ?Active Problems ?Location of Pain Severity and Description of Pain ?Patient Has Paino No ?Site Locations ?Rate the pain. ?Current Pain Level: 0 ?Pain Management and Medication ?Current Pain Management: ?Medication: No ?Cold Application: No ?Rest: No ?Massage: No ?Activity: No ?T.E.N.S.: No ?Heat Application: No ?Leg drop or  elevation: No ?Is the Current Pain Management Adequate: Adequate ?How does your wound impact your activities of daily livingo ?Sleep: No ?Bathing: No ?Appetite: No ?Relationship With Others: No ?Bladder Continen

## 2021-08-26 NOTE — Progress Notes (Signed)
? ?Office Visit Note ? ?Patient: Anthony [name]             ?Date of Birth: [birth year]           ?MRN: [id number]             ?PCP: Pcp, No ?Referring: Collene Gobble, MD ?Visit Date: 08/27/2021 ?Occupation: Optometrist ? ?Subjective:  ?New Patient (Initial Visit) (Rash on bil hands, wound on right calf) ? ? ?History of Present Illness: Anthony [name] is a 41 y.o. 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 He previously took methotrexate without definite symptom control. Recently increased back to 30 mg prednisone tapering. He has some chronic joint pain in multiple areas including chronic rotator cuff tear and lumbar spine DJD.  He has longstanding disease with chronic joint pain in both knees since he was a teenager. This has been attributed to use related and also found to have significant OA on xrays from a few years ago.  Skin disease is also been chronic mostly affecting the palms of bilateral hands.  However starting around 2018 he developed shortness of breath symptoms as well as worsening skin inflammation this led to original diagnosis of sarcoidosis.  He has received off-and-on treatments of prednisone which improved his symptoms significantly.  He did take methotrexate however feels like his joints and skin may have been not improved or possibly worse when on the medication definitely do not see an improvement in symptoms.  More recently in the past year he is also developed some hair loss and pigmentation changes on the scalp.  He has hyperpigmented skin patches on distal legs and developed a right leg ulcer this has been healing over the past 3 weeks following up with wound care treatment is also been on the prednisone during this time.  The most recent respiratory symptom exacerbation in the past few months now on prednisone for least about 6 weeks slowly tapering.  He was recommended for updated chest CT scan is had be rescheduled to a different site for machine  weight capacity limit he has not yet gotten this rescheduled again. ?He denies any involvement of skin on soles of the feet like he has on his hands.  He has used hypotensive topical steroids for this in the past but not recently.  He denies any eye inflammation problems or vision changes. ? ?09/10/2017 CT Chest ?IMPRESSION: ?1. Marked decrease in size of RIGHT perihilar pulmonary parenchymal nodules and LEFT upper lobe nodule with linear platelike scarring remaining. ?2. Mild mediastinal adenopathy is similar to slightly decreased. ? ?Activities of Daily Living:  ?Patient reports morning stiffness for 24 hours.   ?Patient Reports nocturnal pain.  ?Difficulty dressing/grooming: Reports ?Difficulty climbing stairs: Reports ?Difficulty getting out of chair: Reports ?Difficulty using hands for taps, buttons, cutlery, and/or writing: Reports ? ?Review of Systems  ?Constitutional:  Positive for fatigue.  ?HENT:  Negative for mouth dryness.   ?Eyes:  Positive for dryness.  ?Respiratory:  Positive for shortness of breath.   ?Cardiovascular:  Positive for swelling in legs/feet.  ?Gastrointestinal:  Positive for constipation.  ?Endocrine: Positive for heat intolerance and increased urination.  ?Genitourinary:  Negative for difficulty urinating.  ?Musculoskeletal:  Positive for joint pain, joint pain, joint swelling, muscle weakness, morning stiffness and muscle tenderness.  ?Skin:  Positive for rash.  ?Allergic/Immunologic: Negative for susceptible to infections.  ?Neurological:  Positive for numbness and weakness.  ?Hematological:  Positive for bruising/bleeding tendency.  ?Psychiatric/Behavioral:  Positive for sleep disturbance.   ? ?PMFS History:  ?Patient Active Problem List  ? Diagnosis Date Noted  ? High risk medication use 08/27/2021  ? Chest discomfort 12/22/2019  ? Well adult exam 06/24/2018  ? Bilateral knee pain 06/24/2018  ? Eczema 06/24/2018  ? Testicular swelling 06/10/2018  ? Chronic pain 12/05/2017  ? Morbid  obesity ([place]) 09/22/2017  ? Obstructive sleep apnea 05/19/2017  ? Constipation 04/10/2017  ? Mediastinal lymphadenopathy 03/04/2017  ? 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 Abnormal chest x-ray with multiple lung nodules 02/08/2017  ? Reactive depression 01/16/2017  ? Anticoagulated by anticoagulation treatment 01/16/2017  ?  ?Past Medical History:  ?Diagnosis Date  ? Anxiety   ? Depression   ? Dyspnea   ? Eczema of hand   ? Headache   ? hx of with tooth pain  ? Obese   ? Pneumonia   ? 12/2016  ? Pre-diabetes   ? Sarcoidosis   ? Sleep apnea   ? cpap  ?  ?Family History  ?Problem Relation Age of Onset  ? Hypertension Father   ? Diabetes Father   ? Stroke Father   ? Congestive Heart Failure Father   ? Lymphoma Paternal Uncle   ? Ovarian cancer Maternal Grandmother 60  ? ?Past Surgical History:  ?Procedure Laterality Date  ? ARTHROSCOPY KNEE W/ DRILLING    ? right knee   ? ENDOBRONCHIAL ULTRASOUND Bilateral 03/04/2017  ? Procedure: ENDOBRONCHIAL ULTRASOUND;  Surgeon: Collene Gobble, MD;  Location: [name];  Service: Cardiopulmonary;  Laterality: Bilateral;  ? left knee inner growth plate removed    ? to straighten leg  ? ?Social History  ? ?Social History Narrative  ? Not on file  ? ? ?There is no immunization history on file for this patient.  ? ?Objective: ?Vital Signs: BP 115/68 (BP Location: Left Arm, Patient Position: Sitting, Cuff Size: Large)   Pulse 63   Resp 16   Ht 5\' 7"  (1.702 m)   Wt (!) 576 lb (261.3 kg)   BMI 90.21 kg/m?   ? ?Physical Exam ?Constitutional:   ?   Appearance: He is obese.  ?HENT:  ?   Mouth/Throat:  ?   Mouth: Mucous membranes are moist.  ?   Pharynx: Oropharynx is clear.  ?Eyes:  ?   Conjunctiva/sclera: Conjunctivae normal.  ?Cardiovascular:  ?   Rate and Rhythm: Normal rate and regular rhythm.  ?Pulmonary:  ?   Effort: Pulmonary effort is normal.  ?   Breath sounds: Normal breath sounds.  ?Musculoskeletal:  ?   Comments: Trace pitting edema  bilaterally, right distal leg in compression wrap  ?Skin: ?   General: Skin is warm and dry.  ?   Comments: Severe skin thickening with hyperpigmentation and some peeling or cracking on the palms of both hands worse on right side, longitudinal hyperpigmentation in multiple fingernails no digital pitting or other nail changes ?Flat hyperpigmented nontender patches on anterior left leg distally, right leg is wrapped was left in place  ?Neurological:  ?   Mental Status: He is alert.  ?Psychiatric:     ?   Mood and Affect: Mood normal.  ?  ? ?Musculoskeletal Exam:  ?Shoulders full ROM no tenderness or swelling ?Elbows full ROM no tenderness or swelling ?Wrists full ROM no tenderness or swelling ?Fingers full ROM no tenderness or swelling ?Knees mildly decreased flexion extension range of motion bilaterally, mild joint line tenderness to palpation  worst on the lateral side ? ? ?Investigation: ?No additional findings. ? ?Imaging: ?XR KNEE 3 VIEW LEFT ? ?Result Date: 08/28/2021 ?X-ray left knee 3 views Diffuse degenerative changes in medial lateral and patellofemoral compartment joint spaces are relatively preserved but large amount of osteophytes.  Markedly along medial joint line also between tibiofibular joint. There is marked varus deformity and some lateral displacement. Superior and inferior patellar enthesophytes present, patellofemoral compartment space slightly better preserved compared to right knee. Some sclerosis possibly degenerative or healed, along looks like medial growth plate.  No significant joint effusions or abnormal calcifications seen. Impression Moderate to severe osteo, also significant varus joint deformity no obvious inflammatory changes seen ? ?XR KNEE 3 VIEW RIGHT ? ?Result Date: 08/28/2021 ?X-ray right knee 3 views Diffuse degenerative changes in medial lateral and patellofemoral compartment joint spaces are relatively preserved but large amount of osteophytes.  There is marked varus deformity  and some lateral displacement. Superior and inferior patellar enthesophytes present. Some sclerosis possibly degenerative or healed, along looks like medial growth plate.  No significant joint effusions or abnormal c

## 2021-08-27 ENCOUNTER — Ambulatory Visit (INDEPENDENT_AMBULATORY_CARE_PROVIDER_SITE_OTHER): Payer: Managed Care, Other (non HMO)

## 2021-08-27 ENCOUNTER — Encounter: Payer: Self-pay | Admitting: Internal Medicine

## 2021-08-27 ENCOUNTER — Ambulatory Visit (INDEPENDENT_AMBULATORY_CARE_PROVIDER_SITE_OTHER): Payer: Managed Care, Other (non HMO) | Admitting: Internal Medicine

## 2021-08-27 VITALS — BP 115/68 | HR 63 | Resp 16 | Ht 67.0 in | Wt >= 6400 oz

## 2021-08-27 DIAGNOSIS — M25561 Pain in right knee: Secondary | ICD-10-CM

## 2021-08-27 DIAGNOSIS — M25562 Pain in left knee: Secondary | ICD-10-CM

## 2021-08-27 DIAGNOSIS — D862 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: Secondary | ICD-10-CM | POA: Diagnosis not present

## 2021-08-27 DIAGNOSIS — R590 Localized enlarged lymph nodes: Secondary | ICD-10-CM | POA: Diagnosis not present

## 2021-08-27 DIAGNOSIS — G8929 Other chronic pain: Secondary | ICD-10-CM

## 2021-08-27 DIAGNOSIS — R918 Other nonspecific abnormal finding of lung field: Secondary | ICD-10-CM

## 2021-08-27 DIAGNOSIS — Z79899 Other long term (current) drug therapy: Secondary | ICD-10-CM | POA: Diagnosis not present

## 2021-08-27 NOTE — Patient Instructions (Signed)
We can consider adding on a medication safer for long term treatment than prednisone. I would consider Humira as an option or could try azathioprine as an oral medication option. ?If you want to review information but we are checking baseline labs for safety and can discuss in more detail at follow up. ?

## 2021-08-28 ENCOUNTER — Encounter (HOSPITAL_BASED_OUTPATIENT_CLINIC_OR_DEPARTMENT_OTHER): Payer: Commercial Managed Care - HMO | Admitting: Internal Medicine

## 2021-08-28 DIAGNOSIS — I87331 Chronic venous hypertension (idiopathic) with ulcer and inflammation of right lower extremity: Secondary | ICD-10-CM

## 2021-08-28 DIAGNOSIS — D862 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: Secondary | ICD-10-CM

## 2021-08-28 DIAGNOSIS — L97812 Non-pressure chronic ulcer of other part of right lower leg with fat layer exposed: Secondary | ICD-10-CM | POA: Diagnosis not present

## 2021-08-28 NOTE — Progress Notes (Signed)
Shella SpearingHUNTLEY, Aarib J. ([id number]) ?Visit Report for 08/28/2021 ?Arrival Information Details ?Patient Name: Date of Service: ?Anthony [name], [name] 08/28/2021 9:30 A M ?Medical Record Number: [id number] ?Patient Account Number: [id number] ?Date of Birth/Sex: Treating RN: ?02/11/1981 (41 y.o. Lytle MichaelsM) Barnhart, [name] ?Primary Care Sherrell Farish: PCP, NO Other Clinician: ?Referring Deveney Bayon: ?Treating Blaise Grieshaber/Extender: Geralyn CorwinHoffman, Jessica ?Weeks in Treatment: 4 ?Visit Information History Since Last Visit ?Added or deleted any medications: No ?Patient Arrived: Ambulatory ?Any new allergies or adverse reactions: No ?Arrival Time: 09:54 ?Had a fall or experienced change in No ?Transfer Assistance: None ?activities of daily living that may affect ?Patient Identification Verified: Yes ?risk of falls: ?Secondary Verification Process Completed: Yes ?Signs or symptoms of abuse/neglect since last visito No ?Patient Requires Transmission-Based Precautions: No ?Hospitalized since last visit: No ?Patient Has Alerts: No ?Implantable device outside of the clinic excluding No ?cellular tissue based products placed in the center ?since last visit: ?Has Dressing in Place as Prescribed: Yes ?Has Compression in Place as Prescribed: Yes ?Pain Present Now: No ?Electronic Signature(s) ?Signed: 08/28/2021 5:04:09 PM By: Antonieta IbaBarnhart, [name] ?Entered By: Antonieta IbaBarnhart, [name] on 08/28/2021 09:55:15 ?-------------------------------------------------------------------------------- ?Compression Therapy Details ?Patient Name: Date of Service: ?Anthony [name], [name] 08/28/2021 9:30 A M ?Medical Record Number: [id number] ?Patient Account Number: [id number] ?Date of Birth/Sex: Treating RN: ?06/22/1980 (41 y.o. Lytle MichaelsM) Barnhart, [name] ?Primary Care Lecretia Buczek: PCP, NO Other Clinician: ?Referring Jojo Geving: ?Treating Opaline Reyburn/Extender: Geralyn CorwinHoffman, Jessica ?Weeks in Treatment: 4 ?Compression Therapy Performed for Wound Assessment: Wound #1 Right,Medial Lower Leg ?Performed By: Clinician Antonieta IbaBarnhart, [name],  RN ?Compression Type: Double Layer ?Post Procedure Diagnosis ?Same as Pre-procedure ?Electronic Signature(s) ?Signed: 08/28/2021 5:04:09 PM By: Antonieta IbaBarnhart, [name] ?Entered By: Antonieta IbaBarnhart, [name] on 08/28/2021 10:34:46 ?-------------------------------------------------------------------------------- ?Encounter Discharge Information Details ?Patient Name: ?Date of Service: ?Anthony [name], [name] 08/28/2021 9:30 A M ?Medical Record Number: [id number] ?Patient Account Number: [id number] ?Date of Birth/Sex: ?Treating RN: ?10/17/1980 (41 y.o. Lytle MichaelsM) Barnhart, [name] ?Primary Care Simmie Camerer: PCP, NO ?Other Clinician: ?Referring Linley Moxley: ?Treating Nashley Cordoba/Extender: Geralyn CorwinHoffman, Jessica ?Weeks in Treatment: 4 ?Encounter Discharge Information Items ?Discharge Condition: Stable ?Ambulatory Status: Ambulatory ?Discharge Destination: Home ?Transportation: Private Auto ?Schedule Follow-up Appointment: Yes ?Clinical Summary of Care: Provided on 08/28/2021 ?Form Type Recipient ?Paper Patient Patient ?Electronic Signature(s) ?Signed: 08/28/2021 5:04:09 PM By: Antonieta IbaBarnhart, [name] ?Entered By: Antonieta IbaBarnhart, [name] on 08/28/2021 10:50:55 ?-------------------------------------------------------------------------------- ?Lower Extremity Assessment Details ?Patient Name: ?Date of Service: ?Anthony [name], [name] 08/28/2021 9:30 A M ?Medical Record Number: [id number] ?Patient Account Number: [id number] ?Date of Birth/Sex: ?Treating RN: ?03/27/1981 (41 y.o. Lytle MichaelsM) Barnhart, [name] ?Primary Care Rupal Childress: PCP, NO ?Other Clinician: ?Referring Jackie Russman: ?Treating Danali Marinos/Extender: Geralyn CorwinHoffman, Jessica ?Weeks in Treatment: 4 ?Edema Assessment ?Assessed: [Left: No] [Right: Yes] ?Edema: [Left: Ye] [Right: s] ?Calf ?Left: Right: ?Point of Measurement: 29 cm From Medial Instep 59 cm ?Ankle ?Left: Right: ?Point of Measurement: 11 cm From Medial Instep 31 cm ?Vascular Assessment ?Pulses: ?Dorsalis Pedis ?Palpable: [Right:Yes] ?Electronic Signature(s) ?Signed: 08/28/2021 5:04:09 PM By: Antonieta IbaBarnhart,  [name] ?Entered By: Antonieta IbaBarnhart, [name] on 08/28/2021 10:05:41 ?-------------------------------------------------------------------------------- ?Multi Wound Chart Details ?Patient Name: ?Date of Service: ?Anthony [name], [name] 08/28/2021 9:30 A M ?Medical Record Number: [id number] ?Patient Account Number: [id number] ?Date of Birth/Sex: ?Treating RN: ?01/25/1981 (41 y.o. Lytle MichaelsM) Barnhart, [name] ?Primary Care Catharine Kettlewell: PCP, NO ?Other Clinician: ?Referring Kaleel Schmieder: ?Treating Jacklyne Baik/Extender: Geralyn CorwinHoffman, Jessica ?Weeks in Treatment: 4 ?Vital Signs ?Height(in): 68 ?Pulse(bpm): 69 ?Weight(lbs): 572 ?Blood Pressure(mmHg): 154/88 ?Body Mass Index(BMI): 87 ?Temperature(??F): 98.7 ?Respiratory Rate(breaths/min): 18 ?Photos: [N/A:N/A] ?Right, Medial Lower Leg N/A N/A ?Wound  Location: ?Other Lesion N/A N/A ?Wounding Event: ?Venous Leg Ulcer N/A N/A ?Primary Etiology: ?Sleep Apnea, Rheumatoid Arthritis, N/A N/A ?Comorbid History: ?Osteoarthritis ?08/24/2020 N/A N/A ?Date Acquired: ?4 N/A N/A ?Weeks of Treatment: ?Open N/A N/A ?Wound Status: ?No N/A N/A ?Wound Recurrence: ?0.1x0.1x0.1 N/A N/A ?Measurements L x W x D (cm) ?0.008 N/A N/A ?A (cm?) : ?rea ?0.001 N/A N/A ?Volume (cm?) : ?98.30% N/A N/A ?% Reduction in Area: ?99.30% N/A N/A ?% Reduction in Volume: ?Full Thickness Without Exposed N/A N/A ?Classification: ?Support Structures ?None Present N/A N/A ?Exudate Amount: ?Flat and Intact N/A N/A ?Wound Margin: ?Large (67-100%) N/A N/A ?Granulation Amount: ?Pink N/A N/A ?Granulation Quality: ?None Present (0%) N/A N/A ?Necrotic Amount: ?Fat Layer (Subcutaneous Tissue): Yes N/A N/A ?Exposed Structures: ?Fascia: No ?Tendon: No ?Muscle: No ?Joint: No ?Bone: No ?Large (67-100%) N/A N/A ?Epithelialization: ?Compression Therapy N/A N/A ?Procedures Performed: ?Treatment Notes ?Wound #1 (Lower Leg) Wound Laterality: Right, Medial ?Cleanser ?Soap and Water ?Discharge Instruction: May shower and wash wound with dial antibacterial soap and water prior to  dressing change. ?Wound Cleanser ?Discharge Instruction: Cleanse the wound with wound cleanser prior to applying a clean dressing using gauze sponges, not tissue or cotton balls. ?Peri-Wound Care ?Sween Lotion (Moisturizing lotion) ?Discharge Instruction: Apply moisturizing lotion as directed ?Topical ?Primary Dressing ?Xeroform Occlusive Gauze Dressing, 4x4 in ?Discharge Instruction: Apply to wound bed as instructed ?Secondary Dressing ?ABD Pad, 5x9 ?Discharge Instruction: Apply over primary dressing as directed. ?Secured With ?Compression Wrap ?ThreePress (3 layer compression wrap) ?Discharge Instruction: Apply three layer compression as directed. ?Unnaboot w/Calamine, 4x10 (in/yd) ?Discharge Instruction: Apply at top to anchor wrap ?Compression Stockings ?Add-Ons ?Electronic Signature(s) ?Signed: 08/28/2021 1:49:49 PM By: Geralyn Corwin DO ?Signed: 08/28/2021 5:04:09 PM By: Antonieta Iba ?Entered By: Geralyn Corwin on 08/28/2021 12:15:07 ?-------------------------------------------------------------------------------- ?Multi-Disciplinary Care Plan Details ?Patient Name: ?Date of Service: ?Anthony [name], [name] 08/28/2021 9:30 A M ?Medical Record Number: [id number] ?Patient Account Number: [id number] ?Date of Birth/Sex: ?Treating RN: ?1980/12/30 (41 y.o. [name] ?Primary Care Dudley Cooley: PCP, NO ?Other Clinician: ?Referring Loree Shehata: ?Treating Janneth Krasner/Extender: Geralyn Corwin ?Weeks in Treatment: 4 ?Active Inactive ?Venous Leg Ulcer ?Nursing Diagnoses: ?Actual venous Insuffiency (use after diagnosis is confirmed) ?Goals: ?Patient will maintain optimal edema control ?Date Initiated: 07/31/2021 ?Target Resolution Date: 08/28/2021 ?Goal Status: Active ?Interventions: ?Compression as ordered ?Provide education on venous insufficiency ?Treatment Activities: ?Therapeutic compression applied : 07/31/2021 ?Notes: ?Wound/Skin Impairment ?Nursing Diagnoses: ?Impaired tissue integrity ?Goals: ?Patient/caregiver will  verbalize understanding of skin care regimen ?Date Initiated: 07/31/2021 ?Target Resolution Date: 08/28/2021 ?Goal Status: Active ?Ulcer/skin breakdown will have a volume reduction of 30% by week 4 ?Date Initiated:

## 2021-08-28 NOTE — Progress Notes (Signed)
ALTARIQ, GOODALL ([id number]) ?Visit Report for 08/28/2021 ?Chief Complaint Document Details ?Patient Name: Date of Service: ?Anthony [name], [name] 08/28/2021 9:30 A M ?Medical Record Number: [id number] ?Patient Account Number: [id number] ?Date of Birth/Sex: Treating RN: ?November 05, 1980 (41 y.o. Anthony [name] ?Primary Care Provider: PCP, NO Other Clinician: ?Referring Provider: ?Treating Provider/Extender: Anthony [name] ?Weeks in Treatment: 4 ?Information Obtained from: Patient ?Chief Complaint ?Right lower extremity wound ?Electronic Signature(s) ?Signed: 08/28/2021 1:49:49 PM By: Anthony Corwin DO ?Entered By: Anthony [name] on 08/28/2021 12:16:27 ?-------------------------------------------------------------------------------- ?HPI Details ?Patient Name: Date of Service: ?Anthony [name], [name] 08/28/2021 9:30 A M ?Medical Record Number: [id number] ?Patient Account Number: [id number] ?Date of Birth/Sex: Treating RN: ?13-Dec-1980 (41 y.o. Anthony [name] ?Primary Care Provider: PCP, NO Other Clinician: ?Referring Provider: ?Treating Provider/Extender: Anthony [name] ?Weeks in Treatment: 4 ?History of Present Illness ?HPI Description: Admission 07/31/2021 ?[name] is a 42 year old male with a past medical history of sarcoidosis, obstructive sleep apnea and morbid obesity that presents to the clinic for ?a 1 year history of wounds to his right lower extremity. He states these wax and wane in size. He states that it currently does not drain but has weeped in the ?past. He reports taking amoxicillin last month and this helped improve the wound size and appearance. He does not have compression stockings. He currently ?denies signs of infection. ?3/28; patient presents for follow-up. He tolerated the compression wrap well. He has no issues or complaints today. He denies signs of infection. ?4/4; patient presents for follow-up. He has no issues or complaints ?4/11; 1 week follow-up. The patient appears to be  doing very well with the wound on the right medial lower leg. We are using Hydrofera Blue and 3 layer ?compression. The patient has sarcoidosis and is on chronic prednisone he is convinced that both of these have caused this wound although it certainly looks ?like this is chronic venous insufficiency with developing lymphedema.He has never worn compression stockings. ?4/18; patient presents for follow-up. He has no issues or complaints today. He has not ordered compression stockings yet but states he will do this this week. ?Electronic Signature(s) ?Signed: 08/28/2021 1:49:49 PM By: Anthony Corwin DO ?Entered By: Anthony [name] on 08/28/2021 12:17:40 ?-------------------------------------------------------------------------------- ?Physical Exam Details ?Patient Name: Date of Service: ?Anthony [name], [name] 08/28/2021 9:30 A M ?Medical Record Number: [id number] ?Patient Account Number: [id number] ?Date of Birth/Sex: Treating RN: ?May 07, 1981 (41 y.o. Anthony [name] ?Primary Care Provider: PCP, NO Other Clinician: ?Referring Provider: ?Treating Provider/Extender: Anthony [name] ?Weeks in Treatment: 4 ?Constitutional ?respirations regular, non-labored and within target range for patient.Marland Kitchen ?Psychiatric ?pleasant and cooperative. ?Notes ?Right lower extremity: T the medial aspect there is a very small open wound. No signs of surrounding infection. Appears well-healing. ?o ?Electronic Signature(s) ?Signed: 08/28/2021 1:49:49 PM By: Anthony Corwin DO ?Entered By: Anthony [name] on 08/28/2021 12:20:15 ?-------------------------------------------------------------------------------- ?Physician Orders Details ?Patient Name: Date of Service: ?Anthony [name], [name] 08/28/2021 9:30 A M ?Medical Record Number: [id number] ?Patient Account Number: [id number] ?Date of Birth/Sex: Treating RN: ?05-16-1980 (41 y.o. 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  ppointment in 1 week. - with Dr. Mikey BussingHoffman Lennox [name](Anthony [name], Room 7) ?Return A ?Bathing/ Shower/ Hygiene ?May shower with protection but do not get wound dressing(s) wet. - May use cast protector bag. Can get at [facility], [facility] or [employer] ?Edema Control - Lymphedema / SCD / Other ?Avoid standing for long periods of time. ?Other Edema Control Orders/Instructions: - Information given for [employer] for compression stockings. ?Wound Treatment ?Wound #1 - Lower Leg Wound Laterality: Right, Medial ?Cleanser: Soap and Water 1 x Per Week/30 Days ?Discharge Instructions: May shower and wash wound with dial antibacterial soap and water prior to dressing change. ?Cleanser: Wound Cleanser 1 x Per Week/30 Days ?Discharge Instructions: Cleanse the wound with wound cleanser prior to applying a clean dressing using gauze sponges, not tissue or cotton balls. ?Peri-Wound Care: Sween Lotion (Moisturizing lotion) 1 x Per Week/30 Days ?Discharge Instructions: Apply moisturizing lotion as directed ?Prim Dressing: Xeroform Occlusive Gauze Dressing, 4x4 in 1 x Per Week/30 Days ?ary ?Discharge Instructions: Apply to wound bed as instructed ?Secondary Dressing: ABD Pad, 5x9 1 x Per Week/30 Days ?Discharge Instructions: Apply over primary dressing as directed. ?Compression Wrap: ThreePress (3 layer compression wrap) 1 x Per Week/30 Days ?Discharge Instructions: Apply three layer compression as directed. ?Compression Wrap: Unnaboot w/Calamine, 4x10 (in/yd) 1 x Per Week/30 Days ?Discharge Instructions: Apply at top to anchor wrap ?Electronic Signature(s) ?Signed:  08/28/2021 1:49:49 PM By: Anthony CorwinHoffman, Alynah Schone DO ?Entered By: Anthony CorwinHoffman, Deziray Nabi on 08/28/2021 12:23:42 ?-------------------------------------------------------------------------------- ?Problem List Details ?Patient Name: ?Date of Service: ?Anthony [name], [name] 08/28/2021 9:30 A M ?Medical Record Number: [id number] ?Patient Account Number: [id number] ?Date of Birth/Sex: ?Treating RN: ?02/26/1981 (41 y.o. 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 08/28/2021 9:30 A M ?Medical Record Number: [id number] ?Patient Account Number: [id number] ?Date of Birth/Sex: Treating RN: ?02/09/1981 (41 y.o. Anthony MichaelsM) Barnhart, Anthony [name] ?Primary Care Provider: PCP, NO Other Clinician: ?Referring Provider: ?Treating Provider/Extender: Anthony CorwinHoffman, Teaghan Formica ?Weeks in Treatment: 4 ?Subjective ?Chief Complaint ?Information obtained from Patient ?Right lower extremity wound ?History of Present Illness (HPI) ?Admission 07/31/2021 ?Mr. Vincente PoliDaaron [name] is a 41 year old male with a past medical history of sarcoidosis, obstructive sleep apnea and morbid obesity that presents to the clinic for ?a 1 year history of wounds to his right lower extremity. He states  these wax and wane in size. He states that it currently does not drain but has weeped in the ?past. He reports taking amoxicillin last month and this helped improve the wound size and appearance. He does not

## 2021-08-30 ENCOUNTER — Encounter: Payer: Self-pay | Admitting: Registered Nurse

## 2021-08-30 ENCOUNTER — Encounter: Payer: Managed Care, Other (non HMO) | Attending: Physical Medicine and Rehabilitation | Admitting: Registered Nurse

## 2021-08-30 VITALS — BP 150/86 | HR 90 | Ht 67.0 in | Wt >= 6400 oz

## 2021-08-30 DIAGNOSIS — G894 Chronic pain syndrome: Secondary | ICD-10-CM

## 2021-08-30 DIAGNOSIS — M48062 Spinal stenosis, lumbar region with neurogenic claudication: Secondary | ICD-10-CM | POA: Diagnosis present

## 2021-08-30 DIAGNOSIS — M25561 Pain in right knee: Secondary | ICD-10-CM | POA: Diagnosis present

## 2021-08-30 DIAGNOSIS — Z5181 Encounter for therapeutic drug level monitoring: Secondary | ICD-10-CM

## 2021-08-30 DIAGNOSIS — G8929 Other chronic pain: Secondary | ICD-10-CM

## 2021-08-30 DIAGNOSIS — Z79891 Long term (current) use of opiate analgesic: Secondary | ICD-10-CM

## 2021-08-30 DIAGNOSIS — M25562 Pain in left knee: Secondary | ICD-10-CM | POA: Insufficient documentation

## 2021-08-30 MED ORDER — OXYCODONE-ACETAMINOPHEN 10-325 MG PO TABS: 1.0000 | ORAL_TABLET | Freq: Four times a day (QID) | ORAL | 0 refills | Status: DC | PRN

## 2021-08-30 NOTE — Progress Notes (Signed)
? ?Subjective:  ? ? Patient ID: Anthony [name], male    DOB: 12-02-1980, 41 y.o.   MRN: [id number] ? ?HPI: Anthony [name] is a 41 y.o. male who returns for follow up appointment for chronic pain and medication refill. He states his  pain is located in his lower back and bilateral knee pain. He also reports right hand pain related to his sarcoidosis, he states rheumatology and pulmonology following. He rates his pain 8. His current exercise regime is walking,performing stretching exercises and lifting light weights. ? ?Mr. Veale Morphine equivalent is 60.00 MME.   UDS ordered today.  ?  ? ?Pain Inventory ?Average Pain 5 ?Pain Right Now 8 ?My pain is constant, sharp, burning, dull, stabbing, tingling, and aching ? ?In the last 24 hours, has pain interfered with the following? ?General activity 10 ?Relation with others 10 ?Enjoyment of life 10 ?What TIME of day is your pain at its worst? morning , daytime, evening, and night ?Sleep (in general) Poor ? ?Pain is worse with: walking, bending, sitting, and standing ?Pain improves with: rest and medication ?Relief from Meds: 2 ? ?Family History  ?Problem Relation Age of Onset  ? Hypertension Father   ? Diabetes Father   ? Stroke Father   ? Congestive Heart Failure Father   ? Lymphoma Paternal Uncle   ? Ovarian cancer Maternal Grandmother 60  ? ?Social History  ? ?Socioeconomic History  ? Marital status: Married  ?  Spouse name: Not on file  ? Number of children: Not on file  ? Years of education: Not on file  ? Highest education level: Not on file  ?Occupational History  ? Occupation: Disabled  ?Tobacco Use  ? Smoking status: Former  ?  Packs/day: 0.30  ?  Years: 10.00  ?  Pack years: 3.00  ?  Types: Cigarettes  ?  Quit date: 09/30/2016  ?  Years since quitting: 4.9  ? Smokeless tobacco: Never  ?Vaping Use  ? Vaping Use: Never used  ?Substance and Sexual Activity  ? Alcohol use: No  ? Drug use: No  ? Sexual activity: Yes  ?  Birth control/protection: Condom  ?Other  Topics Concern  ? Not on file  ?Social History Narrative  ? Not on file  ? ?Social Determinants of Health  ? ?Financial Resource Strain: Not on file  ?Food Insecurity: Not on file  ?Transportation Needs: Not on file  ?Physical Activity: Not on file  ?Stress: Not on file  ?Social Connections: Not on file  ? ?Past Surgical History:  ?Procedure Laterality Date  ? ARTHROSCOPY KNEE W/ DRILLING    ? right knee   ? ENDOBRONCHIAL ULTRASOUND Bilateral 03/04/2017  ? Procedure: ENDOBRONCHIAL ULTRASOUND;  Surgeon: Leslye Peer, MD;  Location: [facility];  Service: Cardiopulmonary;  Laterality: Bilateral;  ? left knee inner growth plate removed    ? to straighten leg  ? ?Past Surgical History:  ?Procedure Laterality Date  ? ARTHROSCOPY KNEE W/ DRILLING    ? right knee   ? ENDOBRONCHIAL ULTRASOUND Bilateral 03/04/2017  ? Procedure: ENDOBRONCHIAL ULTRASOUND;  Surgeon: Leslye Peer, MD;  Location: [facility];  Service: Cardiopulmonary;  Laterality: Bilateral;  ? left knee inner growth plate removed    ? to straighten leg  ? ?Past Medical History:  ?Diagnosis Date  ? Anxiety   ? Depression   ? Dyspnea   ? Eczema of hand   ? Headache   ? hx of with tooth pain  ? Obese   ?  Pneumonia   ? 12/2016  ? Pre-diabetes   ? Sarcoidosis   ? Sleep apnea   ? cpap  ? ?BP (!) 150/86   Pulse 90   Ht 5\' 7"  (1.702 m)   Wt (!) 580 lb 12.8 oz (263.4 kg)   SpO2 95%   BMI 90.97 kg/m?  ? ?Opioid Risk Score:   ?Fall Risk Score:  `1 ? ?Depression screen PHQ 2/9 ? ? ?  08/01/2021  ?  8:30 AM 07/06/2021  ?  8:43 AM 06/07/2021  ? 10:15 AM 05/11/2021  ?  8:17 AM 03/06/2021  ?  8:51 AM 02/09/2021  ?  9:05 AM 01/11/2021  ?  8:46 AM  ?Depression screen PHQ 2/9  ?Decreased Interest 1 0 1 0 1 1 1   ?Down, Depressed, Hopeless 1 0 1 1 1 1 1   ?PHQ - 2 Score 2 0 2 1 2 2 2   ?  ? ?Review of Systems  ?Musculoskeletal:  Positive for back pain.  ?     Bilateral knee pain ?Right leg pain  ?Neurological:  Positive for weakness.  ?All other systems reviewed and are  negative. ? ?   ?Objective:  ? Physical Exam ?Vitals and nursing note reviewed.  ?Constitutional:   ?   Appearance: Normal appearance. He is obese.  ?Cardiovascular:  ?   Rate and Rhythm: Normal rate and regular rhythm.  ?   Pulses: Normal pulses.  ?   Heart sounds: Normal heart sounds.  ?Pulmonary:  ?   Effort: Pulmonary effort is normal.  ?   Breath sounds: Normal breath sounds.  ?Musculoskeletal:  ?   Cervical back: Normal range of motion and neck supple.  ?   Comments: Normal Muscle Bulk and Muscle Testing Reveals:  ?Upper Extremities: Full ROM and Muscle Strength 5/5 ? Lumbar Hypersensitivity ?Lower Extremities: Decreased ROM and Muscle Strength 4/5 ?Bilateral Lower Extremity Flexion Produces Pain into his Bilateral Patella ?Arises from Table Slowly ?Antalgic  Gait  ?   ?Skin: ?   General: Skin is warm and dry.  ?Neurological:  ?   Mental Status: He is alert and oriented to person, place, and time.  ?Psychiatric:     ?   Mood and Affect: Mood normal.     ?   Behavior: Behavior normal.  ? ? ? ? ?   ?Assessment & Plan:  ?1.Right Lumbar Radiculitis/ Spinal Stenosis : Continue HEP as Tolerated and Continue to Monitor. 08/30/2021 ?Refilled: :Oxycodone 10/325 mg one tablet 4 times a day as needed for pain #120. We will continue the opioid monitoring program, this consists of regular clinic visits, examinations, urine drug screen, pill counts as well as use of [place] Controlled Substance Reporting system. A 12 month History has been reviewed on the [place] Controlled Substance Reporting System on 08/30/2021.  ?2. Thoracic Back Pain: No complaints today.Continue HEP as Tolerated . Continue to Monitor. 08/30/2021 ?3. Right Shoulder Pain: No Complaints today. Continue HEP as Tolerated and Continue to Monitor.08/30/2021 ?4. Chronic Pain Syndrome: Continue HEP as Tolerated. Continue to Monitor. 08/30/2021 ?5. Morbid Obesity: Continue Healthy Diet Regimen. Continue to Monitor. 08/30/2021 ?6. Chronic Bilateral  Knee Pain:Continue HEP as Tolerated. Continue to Monitor. 08/30/2021 ?  ?  ?F/U in 1 month  ?  ? ?

## 2021-09-04 ENCOUNTER — Encounter (HOSPITAL_BASED_OUTPATIENT_CLINIC_OR_DEPARTMENT_OTHER): Payer: Commercial Managed Care - HMO | Admitting: Internal Medicine

## 2021-09-04 DIAGNOSIS — L97812 Non-pressure chronic ulcer of other part of right lower leg with fat layer exposed: Secondary | ICD-10-CM

## 2021-09-04 DIAGNOSIS — I87331 Chronic venous hypertension (idiopathic) with ulcer and inflammation of right lower extremity: Secondary | ICD-10-CM | POA: Diagnosis not present

## 2021-09-04 DIAGNOSIS — D862 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: Secondary | ICD-10-CM | POA: Diagnosis not present

## 2021-09-04 NOTE — Progress Notes (Signed)
Anthony [name], SCHREUR ([id number]) ?Visit Report for 09/04/2021 ?Chief Complaint Document Details ?Patient Name: Date of Service: ?[name], [name] 09/04/2021 9:40 A M ?Medical Record Number: [id number] ?Patient Account Number: [id number] ?Date of Birth/Sex: Treating RN: ?05/13/1981 (41 y.o. Anthony [name] ?Primary Care Provider: PCP, NO Other Clinician: ?Referring Provider: ?Treating Provider/Extender: Geralyn Corwin ?Weeks in Treatment: 5 ?Information Obtained from: Patient ?Chief Complaint ?Right lower extremity wound ?Electronic Signature(s) ?Signed: 09/04/2021 5:02:43 PM By: Geralyn Corwin DO ?Entered By: Geralyn Corwin on 09/04/2021 16:35:37 ?-------------------------------------------------------------------------------- ?HPI Details ?Patient Name: Date of Service: ?Anthony [name], [name] 09/04/2021 9:40 A M ?Medical Record Number: [id number] ?Patient Account Number: [id number] ?Date of Birth/Sex: Treating RN: ?1980-06-13 (41 y.o. Anthony [name] ?Primary Care Provider: PCP, NO Other Clinician: ?Referring Provider: ?Treating Provider/Extender: Geralyn Corwin ?Weeks in Treatment: 5 ?History of Present Illness ?HPI Description: Admission 07/31/2021 ?Anthony [name] is a 41 year old male with a past medical history of sarcoidosis, obstructive sleep apnea and morbid obesity that presents to the clinic for ?a 1 year history of wounds to his right lower extremity. He states these wax and wane in size. He states that it currently does not drain but has weeped in the ?past. He reports taking amoxicillin last month and this helped improve the wound size and appearance. He does not have compression stockings. He currently ?denies signs of infection. ?3/28; patient presents for follow-up. He tolerated the compression wrap well. He has no issues or complaints today. He denies signs of infection. ?4/4; patient presents for follow-up. He has no issues or complaints ?4/11; 1 week follow-up. The patient appears to be  doing very well with the wound on the right medial lower leg. We are using Hydrofera Blue and 3 layer ?compression. The patient has sarcoidosis and is on chronic prednisone he is convinced that both of these have caused this wound although it certainly looks ?like this is chronic venous insufficiency with developing lymphedema.He has never worn compression stockings. ?4/18; patient presents for follow-up. He has no issues or complaints today. He has not ordered compression stockings yet but states he will do this this week. ?4/25; patient presents for follow-up. He has not ordered his compression stockings. He has the information to do so. He tolerated the compression wrap well. ?He has no issues or complaints today. ?Electronic Signature(s) ?Signed: 09/04/2021 5:02:43 PM By: Geralyn Corwin DO ?Entered By: Geralyn Corwin on 09/04/2021 16:36:02 ?-------------------------------------------------------------------------------- ?Physical Exam Details ?Patient Name: Date of Service: ?Anthony [name], [name] 09/04/2021 9:40 A M ?Medical Record Number: [id number] ?Patient Account Number: [id number] ?Date of Birth/Sex: Treating RN: ?[birth year] (41 y.o. Anthony [name] ?Primary Care Provider: PCP, NO Other Clinician: ?Referring Provider: ?Treating Provider/Extender: Geralyn Corwin ?Weeks in Treatment: 5 ?Constitutional ?respirations regular, non-labored and within target range for patient.Marland Kitchen ?Cardiovascular ?2+ dorsalis pedis/posterior tibialis pulses. ?Psychiatric ?pleasant and cooperative. ?Notes ?Right lower extremity: There is epithelization to the previous wound site. No signs of surrounding infection. ?Electronic Signature(s) ?Signed: 09/04/2021 5:02:43 PM By: Geralyn Corwin DO ?Entered By: Geralyn Corwin on 09/04/2021 16:36:33 ?-------------------------------------------------------------------------------- ?Physician Orders Details ?Patient Name: Date of Service: ?[name], [name] 09/04/2021 9:40 A M ?Medical  Record Number: [id number] ?Patient Account Number: [id number] ?Date of Birth/Sex: Treating RN: ?10-15-1980 (41 y.o. 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 ?Edema Control - Lymphedema / SCD / Other ?Elevate legs to the level of the heart or above for 30 minutes daily and/or when sitting, a frequency of: - 3-4 times a day throughout the day. ?Avoid standing for long periods of time. ?Patient to wear own compression stockings every day. ?Exercise regularly ?Moisturize legs daily. ?Compression stocking or Garment 20-30 mm/Hg pressure to: - purchase and apply in the morning and remove at night. ?Electronic Signature(s) ?Signed: 09/04/2021 5:02:43 PM By: Geralyn CorwinHoffman, Aydian Dimmick DO ?Entered By: Geralyn CorwinHoffman, Jamell Opfer on 09/04/2021 16:37:03 ?-------------------------------------------------------------------------------- ?Problem List Details ?Patient Name: ?Date of Service: ?Anthony [name], [name] 09/04/2021 9:40 A M ?Medical Record Number: [id number] ?Patient Account Number: [id number] ?Date of Birth/Sex: ?Treating RN: ?10/27/1980 (41 y.o. 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 09/04/2021 9:40 A M ?Medical Record Number: [id number] ?Patient Account Number: [id number] ?Date of Birth/Sex: ?Treating RN: ?08/22/1980 (41 y.o. Anthony MichaelsM) Barnhart, [name] ?Primary Care Provider: PCP, NO ?Other Clinician: ?Referring Provider: ?Treating Provider/Extender: Geralyn CorwinHoffman, Ryaan Vanwagoner ?Weeks in Treatment: 5 ?Subjective ?Chief Complaint ?Information obtained from Patient ?Right lower extremity wound ?History of Present Illness (HPI) ?Admission 07/31/2021 ?Mr. Vincente PoliDaaron Evilsizer is a 41 year old male with a past medical history of sarcoidosis, obstructive sleep apnea and morbid obesity that presents to the clinic for ?a 1 year history of wounds to his right lower extremity. He states these wax and wane in size. He states that it currently does not drain but has weeped in the ?past. He reports taking amoxicillin last month and this helped improve the wound size and appearance. He does not have compression stockings. He currently ?denies signs of infection. ?3/28; patient presents for follow-up. He tolerated the compression wrap well. He has no issues or complaints today. He denies signs of infection. ?4/4; patient presents for follow-up. He has no issues or complaints ?4/11; 1 week follow-up. The patient appears to be doing very well with the wound on the right medial lower leg. We are using Hydrofera Blue and 3 layer ?compression. The patient has sarcoidosis and is on chronic prednisone he is convinced that both of these have caused this  wound although it certainly looks ?like this is chronic venous insufficiency with developing lymphedema.He has never worn compression stockings. ?4/18; patient presents for follow-up. He has no issues or c

## 2021-09-05 LAB — CBC WITH DIFFERENTIAL/PLATELET
Absolute Monocytes: 570 cells/uL (ref 200–950)
Basophils Absolute: 76 cells/uL (ref 0–200)
Basophils Relative: 0.8 %
Eosinophils Absolute: 133 cells/uL (ref 15–500)
Eosinophils Relative: 1.4 %
HCT: 47.7 % (ref 38.5–50.0)
Hemoglobin: 16 g/dL (ref 13.2–17.1)
Lymphs Abs: 2489 cells/uL (ref 850–3900)
MCH: 30.9 pg (ref 27.0–33.0)
MCHC: 33.5 g/dL (ref 32.0–36.0)
MCV: 92.1 fL (ref 80.0–100.0)
MPV: 10.8 fL (ref 7.5–12.5)
Monocytes Relative: 6 %
Neutro Abs: 6232 cells/uL (ref 1500–7800)
Neutrophils Relative %: 65.6 %
Platelets: 290 10*3/uL (ref 140–400)
RBC: 5.18 10*6/uL (ref 4.20–5.80)
RDW: 13.2 % (ref 11.0–15.0)
Total Lymphocyte: 26.2 %
WBC: 9.5 10*3/uL (ref 3.8–10.8)

## 2021-09-05 LAB — COMPLETE METABOLIC PANEL WITH GFR
AG Ratio: 1.3 (calc) (ref 1.0–2.5)
ALT: 33 U/L (ref 9–46)
AST: 18 U/L (ref 10–40)
Albumin: 4.3 g/dL (ref 3.6–5.1)
Alkaline phosphatase (APISO): 57 U/L (ref 36–130)
BUN: 11 mg/dL (ref 7–25)
CO2: 28 mmol/L (ref 20–32)
Calcium: 9.4 mg/dL (ref 8.6–10.3)
Chloride: 103 mmol/L (ref 98–110)
Creat: 1.02 mg/dL (ref 0.60–1.26)
Globulin: 3.4 g/dL (calc) (ref 1.9–3.7)
Glucose, Bld: 97 mg/dL (ref 65–99)
Potassium: 4.2 mmol/L (ref 3.5–5.3)
Sodium: 140 mmol/L (ref 135–146)
Total Bilirubin: 0.6 mg/dL (ref 0.2–1.2)
Total Protein: 7.7 g/dL (ref 6.1–8.1)
eGFR: 96 mL/min/{1.73_m2} (ref >=60)

## 2021-09-05 LAB — QUANTIFERON-TB GOLD PLUS
Mitogen-NIL: >10 IU/mL
NIL: 0.04 IU/mL
QuantiFERON-TB Gold Plus: NEGATIVE
TB1-NIL: 0 IU/mL
TB2-NIL: <0 IU/mL

## 2021-09-05 LAB — HEPATITIS C ANTIBODY
Hepatitis C Ab: NONREACTIVE
SIGNAL TO CUT-OFF: 0.16 (ref <1.00)

## 2021-09-05 LAB — ANGIOTENSIN CONVERTING ENZYME: Angiotensin-Converting Enzyme: 22 U/L (ref 9–67)

## 2021-09-05 LAB — RHEUMATOID FACTOR: Rheumatoid fact SerPl-aCnc: <14 IU/mL (ref <14)

## 2021-09-05 LAB — TOXASSURE SELECT,+ANTIDEPR,UR

## 2021-09-05 LAB — HEPATITIS B CORE ANTIBODY, IGM: Hep B C IgM: NONREACTIVE

## 2021-09-05 LAB — HEPATITIS B SURFACE ANTIGEN: Hepatitis B Surface Ag: NONREACTIVE

## 2021-09-05 LAB — HIV ANTIBODY (ROUTINE TESTING W REFLEX): HIV 1&2 Ab, 4th Generation: NONREACTIVE

## 2021-09-05 LAB — THIOPURINE METHYLTRANSFERASE (TPMT), RBC: Thiopurine Methyltransferase, RBC: 9 nmol/hr/mL RBC — ABNORMAL LOW

## 2021-09-05 LAB — SEDIMENTATION RATE: Sed Rate: 6 mm/h (ref 0–15)

## 2021-09-07 ENCOUNTER — Telehealth: Payer: Self-pay | Admitting: *Deleted

## 2021-09-07 NOTE — Progress Notes (Signed)
Anthony [name],  J. ([id number]) ?Visit Report for 09/04/2021 ?Arrival Information Details ?Patient Name: Date of Service: ?, [name] 09/04/2021 9:40 A M ?Medical Record Number: [id number] ?Patient Account Number: [id number] ?Date of Birth/Sex: Treating RN: ?03/31/1981 (41 y.o. Anthony [name]) [name], Anthony ?Primary Care Anthony [name]: PCP, NO Other Clinician: ?Referring Anthony [name]: ?Treating Anthony [name]/Extender: Anthony [name] ?Weeks in Treatment: 5 ?Visit Information History Since Last Visit ?All ordered tests and consults were completed: Yes ?Patient Arrived: Ambulatory ?Added or deleted any medications: No ?Arrival Time: 10:17 ?Any new allergies or adverse reactions: No ?Accompanied By: self ?Had a fall or experienced change in No ?Transfer Assistance: None ?activities of daily living that may affect ?Patient Identification Verified: Yes ?risk of falls: ?Secondary Verification Process Completed: Yes ?Signs or symptoms of abuse/neglect since last visito No ?Patient Requires Transmission-Based Precautions: No ?Hospitalized since last visit: No ?Patient Has Alerts: No ?Implantable device outside of the clinic excluding No ?cellular tissue based products placed in the center ?since last visit: ?Has Dressing in Place as Prescribed: Yes ?Pain Present Now: No ?Electronic Signature(s) ?Signed: 09/07/2021 9:12:08 AM By: Anthony [name], Anthony ?Entered By: Anthony [name], Anthony on 09/04/2021 10:19:14 ?-------------------------------------------------------------------------------- ?Clinic Level of Care Assessment Details ?Patient Name: Date of Service: ?, [name] 09/04/2021 9:40 A M ?Medical Record Number: [id number] ?Patient Account Number: [id number] ?Date of Birth/Sex: Treating RN: ?11/24/1980 (41 y.o. M) [name], Anthony ?Primary Care Anthony [name]: PCP, NO Other Clinician: ?Referring Anthony [name]: ?Treating Tyreanna Bisesi/Extender: Anthony [name] ?Weeks in Treatment: 5 ?Clinic Level of Care Assessment Items ?TOOL 4 Quantity Score ?X- 1 0 ?Use when only  an EandM is performed on FOLLOW-UP visit ?ASSESSMENTS - Nursing Assessment / Reassessment ?X- 1 10 ?Reassessment of Co-morbidities (includes updates in patient status) ?X- 1 5 ?Reassessment of Adherence to Treatment Plan ?ASSESSMENTS - Wound and Skin A ssessment / Reassessment ?X - Simple Wound Assessment / Reassessment - one wound 1 5 ?[]  - 0 ?Complex Wound Assessment / Reassessment - multiple wounds ?X- 1 10 ?Dermatologic / Skin Assessment (not related to wound area) ?ASSESSMENTS - Focused Assessment ?X- 1 5 ?Circumferential Edema Measurements - multi extremities ?X- 1 10 ?Nutritional Assessment / Counseling / Intervention ?[]  - 0 ?Lower Extremity Assessment (monofilament, tuning fork, pulses) ?[]  - 0 ?Peripheral Arterial Disease Assessment (using hand held doppler) ?ASSESSMENTS - Ostomy and/or Continence Assessment and Care ?[]  - 0 ?Incontinence Assessment and Management ?[]  - 0 ?Ostomy Care Assessment and Management (repouching, etc.) ?PROCESS - Coordination of Care ?X - Simple Patient / Family Education for ongoing care 1 15 ?[]  - 0 ?Complex (extensive) Patient / Family Education for ongoing care ?X- 1 10 ?Staff obtains Consents, Records, T Results / Process Orders ?est ?X- 1 10 ?Staff telephones HHA, Nursing Homes / Clarify orders / etc ?[]  - 0 ?Routine Transfer to another Facility (non-emergent condition) ?[]  - 0 ?Routine Hospital Admission (non-emergent condition) ?[]  - 0 ?New Admissions / Manufacturing [employer]urance Authorizations / Ordering NPWT Apligraf, etc. ?, ?[]  - 0 ?Emergency Hospital Admission (emergent condition) ?X- 1 10 ?Simple Discharge Coordination ?[]  - 0 ?Complex (extensive) Discharge Coordination ?PROCESS - Special Needs ?[]  - 0 ?Pediatric / Minor Patient Management ?[]  - 0 ?Isolation Patient Management ?[]  - 0 ?Hearing / Language / Visual special needs ?[]  - 0 ?Assessment of Community assistance (transportation, D/C planning, etc.) ?[]  - 0 ?Additional assistance / Altered mentation ?[]  - 0 ?Support  Surface(s) Assessment (bed, cushion, seat, etc.) ?INTERVENTIONS - Wound Cleansing / Measurement ?X - Simple Wound Cleansing - one wound 1 5 ?[]  -  0 ?Complex Wound Cleansing - multiple wounds ?X- 1 5 ?Wound Imaging (photographs - any number of wounds) ?[]  - 0 ?Wound Tracing (instead of photographs) ?X- 1 5 ?Simple Wound Measurement - one wound ?[]  - 0 ?Complex Wound Measurement - multiple wounds ?INTERVENTIONS - Wound Dressings ?[]  - 0 ?Small Wound Dressing one or multiple wounds ?[]  - 0 ?Medium Wound Dressing one or multiple wounds ?[]  - 0 ?Large Wound Dressing one or multiple wounds ?[]  - 0 ?Application of Medications - topical ?[]  - 0 ?Application of Medications - injection ?INTERVENTIONS - Miscellaneous ?[]  - 0 ?External ear exam ?[]  - 0 ?Specimen Collection (cultures, biopsies, blood, body fluids, etc.) ?[]  - 0 ?Specimen(s) / Culture(s) sent or taken to Lab for analysis ?[]  - 0 ?Patient Transfer (multiple staff / Nurse, adult / Similar devices) ?[]  - 0 ?Simple Staple / Suture removal (25 or less) ?[]  - 0 ?Complex Staple / Suture removal (26 or more) ?[]  - 0 ?Hypo / Hyperglycemic Management (close monitor of Blood Glucose) ?[]  - 0 ?Ankle / Brachial Index (ABI) - do not check if billed separately ?X- 1 5 ?Vital Signs ?Has the patient been seen at the hospital within the last three years: Yes ?Total Score: 110 ?Level Of Care: New/Established - Level 3 ?Electronic Signature(s) ?Signed: 09/04/2021 5:13:01 PM By: Shawn Stall RN, BSN ?Entered By: Shawn Stall on 09/04/2021 10:38:13 ?-------------------------------------------------------------------------------- ?Encounter Discharge Information Details ?Patient Name: Date of Service: ?[name], [name] 09/04/2021 9:40 A M ?Medical Record Number: [id number] ?Patient Account Number: [id number] ?Date of Birth/Sex: Treating RN: ?10/26/1980 (41 y.o. M) [name], Anthony ?Primary Care Marajade Lei: PCP, NO Other Clinician: ?Referring Ector Laurel: ?Treating Delena Casebeer/Extender: Anthony Corwin ?Weeks in Treatment: 5 ?Encounter Discharge Information Items ?Discharge Condition: Stable ?Ambulatory Status: Ambulatory ?Discharge Destination: Home ?Transportation: Private Auto ?Accompanied By: self ?Schedule Follow-up Appointment: No ?Clinical Summary of Care: ?Electronic Signature(s) ?Signed: 09/04/2021 5:13:01 PM By: Shawn Stall RN, BSN ?Entered By: Shawn Stall on 09/04/2021 10:38:45 ?-------------------------------------------------------------------------------- ?Lower Extremity Assessment Details ?Patient Name: Date of Service: ?Loeza, [name] 09/04/2021 9:40 A M ?Medical Record Number: [id number] ?Patient Account Number: [id number] ?Date of Birth/Sex: Treating RN: ?Aug 14, 1980 (41 y.o. M) [name], Anthony ?Primary Care Hoover Grewe: PCP, NO Other Clinician: ?Referring Shanice Poznanski: ?Treating Thadeus Gandolfi/Extender: Anthony Corwin ?Weeks in Treatment: 5 ?Edema Assessment ?Assessed: [Left: No] [Right: Yes] ?Edema: [Left: Ye] [Right: s] ?Calf ?Left: Right: ?Point of Measurement: 29 cm From Medial Instep 60 cm ?Ankle ?Left: Right: ?Point of Measurement: 11 cm From Medial Instep 31 cm ?Knee To Floor ?Left: Right: ?From Medial Instep 41 cm ?Vascular Assessment ?Pulses: ?Dorsalis Pedis ?Palpable: [Right:Yes] ?Electronic Signature(s) ?Signed: 09/04/2021 5:13:01 PM By: Shawn Stall RN, BSN ?Entered By: Shawn Stall on 09/04/2021 10:31:26 ?-------------------------------------------------------------------------------- ?Multi Wound Chart Details ?Patient Name: ?Date of Service: ?Pree, [name] 09/04/2021 9:40 A M ?Medical Record Number: [id number] ?Patient Account Number: [id number] ?Date of Birth/Sex: ?Treating RN: ?[birth year] (41 y.o. Anthony [name] ?Primary Care Alitzel Cookson: PCP, NO ?Other Clinician: ?Referring Bianna Haran: ?Treating Jamiria Langill/Extender: Anthony Corwin ?Weeks in Treatment: 5 ?Vital Signs ?Height(in): 68 ?Pulse(bpm): 74 ?Weight(lbs): 572 ?Blood Pressure(mmHg): 162/83 ?Body Mass Index(BMI):  87 ?Temperature(??F): 98.5 ?Respiratory Rate(breaths/min): 18 ?Photos: [N/A:N/A] ?Right, Medial Lower Leg N/A N/A ?Wound Location: ?Other Lesion N/A N/A ?Wounding Event: ?Venous Leg Ulcer N/A N/A ?Primary Etiology: ?Sl

## 2021-09-07 NOTE — Telephone Encounter (Signed)
Urine drug screen for this encounter is consistent for prescribed medication 

## 2021-09-13 NOTE — Progress Notes (Signed)
Office Visit Note  Patient: Anthony [name]             Date of Birth: 29-Dec-1980           MRN: [id number]             PCP: Pcp, No Referring: No ref. provider found Visit Date: 09/18/2021   Subjective:  Other (Patient reports symptoms are stable since the last visit. )   History of Present Illness: Anthony [name] is a 41 y.o. 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 He is feeling about the same as at our last visit still join pain worst in bilateral knees and skin rash is present on both palms. Labs from last visit were mostly normal although also showed low TPMT function test.  Previous HPI 08/27/2021 Anthony [name] is a 41 y.o. 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 He previously took methotrexate without definite symptom control. Recently increased back to 30 mg prednisone tapering. He has some chronic joint pain in multiple areas including chronic rotator cuff tear and lumbar spine DJD.  He has longstanding disease with chronic joint pain in both knees since he was a teenager. This has been attributed to use related and also found to have significant OA on xrays from a few years ago.  Skin disease is also been chronic mostly affecting the palms of bilateral hands.  However starting around 2018 he developed shortness of breath symptoms as well as worsening skin inflammation this led to original diagnosis of sarcoidosis.  He has received off-and-on treatments of prednisone which improved his symptoms significantly.  He did take methotrexate however feels like his joints and skin may have been not improved or possibly worse when on the medication definitely do not see an improvement in symptoms.  More recently in the past year he is also developed some hair loss and pigmentation changes on the scalp.  He has hyperpigmented skin patches on distal legs and developed a right leg ulcer this has been healing over the  past 3 weeks following up with wound care treatment is also been on the prednisone during this time.  The most recent respiratory symptom exacerbation in the past few months now on prednisone for least about 6 weeks slowly tapering.  He was recommended for updated chest CT scan is had be rescheduled to a different site for machine weight capacity limit he has not yet gotten this rescheduled again. He denies any involvement of skin on soles of the feet like he has on his hands.  He has used hypotensive topical steroids for this in the past but not recently.  He denies any eye inflammation problems or vision changes.   09/10/2017 CT Chest IMPRESSION: 1. Marked decrease in size of RIGHT perihilar pulmonary parenchymal nodules and LEFT upper lobe nodule with linear platelike scarring remaining. 2. Mild mediastinal adenopathy is similar to slightly decreased.   Review of Systems  Constitutional:  Positive for fatigue.  HENT:  Negative for mouth sores, mouth dryness and nose dryness.   Eyes:  Positive for dryness. Negative for pain and itching.  Respiratory:  Positive for shortness of breath. Negative for difficulty breathing.   Cardiovascular:  Negative for chest pain and palpitations.  Gastrointestinal:  Negative for blood in stool, constipation and diarrhea.  Endocrine: Negative for increased urination.  Genitourinary:  Negative for difficulty urinating.  Musculoskeletal:  Positive for joint pain, joint pain, joint swelling, myalgias,  morning stiffness, muscle tenderness and myalgias.  Skin:  Positive for rash. Negative for color change.  Allergic/Immunologic: Negative for susceptible to infections.  Neurological:  Positive for weakness. Negative for dizziness, numbness, headaches and memory loss.  Hematological:  Negative for bruising/bleeding tendency.  Psychiatric/Behavioral:  Negative for confusion.    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) 02/24/2017   Abnormal chest x-ray with multiple lung nodules 02/08/2017   Reactive depression 01/16/2017   Anticoagulated by anticoagulation treatment 01/16/2017    Past Medical History:  Diagnosis Date   Anxiety    Depression    Dyspnea    Eczema of hand    Headache    hx of with tooth pain   Obese    Pneumonia    12/2016   Pre-diabetes    Sarcoidosis    Sleep apnea    cpap    Family History  Problem Relation Age of Onset   Hypertension Father    Diabetes Father    Stroke Father    Congestive Heart Failure Father    Lymphoma Paternal Uncle    Ovarian cancer Maternal Grandmother [id number]   Past Surgical History:  Procedure Laterality Date   ARTHROSCOPY KNEE W/ DRILLING     right knee    ENDOBRONCHIAL ULTRASOUND Bilateral 03/04/2017   Procedure: ENDOBRONCHIAL ULTRASOUND;  Surgeon: Leslye PeerByrum, Robert S, MD;  Location: [facility];  Service: Cardiopulmonary;  Laterality: Bilateral;   left knee inner growth plate removed     to straighten leg   Social History   Social History Narrative   Not on file    There is no immunization history on file for this patient.   Objective: Vital Signs: BP (!) 162/86 (BP Location: Left Wrist, Patient Position: Sitting, Cuff Size: Normal)   Pulse 78   Ht 5\' 8"  (1.727 m)   Wt (!) 566 lb 12.8 oz (257.1 kg)   BMI 86.18 kg/m    Physical Exam Constitutional:      Appearance: He is obese.  Skin:    General: Skin is warm and dry.     Findings: Rash present.     Comments: Right worse than left palmar rash with hyperpigmentation and skin  thickening Longitudinal nail ridges on fingers Right leg compression wrap  Neurological:     Mental Status: He is alert.  Psychiatric:        Mood and Affect: Mood normal.     Musculoskeletal Exam:  Wrists full ROM no tenderness or swelling Fingers full ROM no tenderness or swelling Knees slightly decreased flexion ROM and joint line tenderness bilaterally   Investigation: No additional findings.  Imaging: No results found.  Recent Labs: Lab Results  Component Value Date   WBC 9.5 08/27/2021   HGB 16.0 08/27/2021   PLT 290 08/27/2021   NA 140 08/27/2021   K 4.2 08/27/2021   CL 103 08/27/2021   CO2 28 08/27/2021   GLUCOSE 97 08/27/2021   BUN 11 08/27/2021   CREATININE 1.02 08/27/2021   BILITOT 0.6 08/27/2021   ALKPHOS 66 05/10/2021  AST 18 08/27/2021   ALT 33 08/27/2021   PROT 7.7 08/27/2021   ALBUMIN 4.1 05/10/2021   CALCIUM 9.4 08/27/2021   GFRAA >60 01/02/2017   QFTBGOLDPLUS NEGATIVE 08/27/2021    Speciality Comments: No specialty comments available.  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  Symptoms currently doing pretty well but is on maintenance prednisone.  Discussed importance to get off this medication contributing to increased risks with weight and related morbidities.  Plan to start on Humira 40 mg subcu q. 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  Chronic pain of both knees - moderately advanced OA.   Reviewed significant mount of osteoarthritis on knee x-rays.  Most likely related to weight which would be a probable issue for any procedural options.  Not sure to what extent he is having inflammatory joint pain have to see how this responds towards all treatment.  High risk medication use  We  discussed risks for starting Humira including cytopenias or liver function test changes, injection site reactions, increased infections, long-term risk for lymphoma or nonmelanoma skin cancer.  Previous baseline labs were all normal.  Mediastinal lymphadenopathy - Abnormal chest x-ray with multiple lung nodules   Orders: No orders of the defined types were placed in this encounter.  No orders of the defined types were placed in this encounter.    Follow-Up Instructions: Return in about 3 months (around 12/19/2021) for Sarcoidosis ADA start f/u.   Fuller Plan, MD  Note - This record has been created using [employer].  Chart creation errors have been sought, but may not always  have been located. Such creation errors do not reflect on  the standard of medical care.

## 2021-09-18 ENCOUNTER — Ambulatory Visit: Payer: Commercial Managed Care - HMO | Admitting: Internal Medicine

## 2021-09-18 ENCOUNTER — Telehealth: Payer: Self-pay | Admitting: *Deleted

## 2021-09-18 ENCOUNTER — Telehealth: Payer: Self-pay

## 2021-09-18 ENCOUNTER — Encounter: Payer: Self-pay | Admitting: Internal Medicine

## 2021-09-18 VITALS — BP 162/86 | HR 78 | Ht 68.0 in | Wt >= 6400 oz

## 2021-09-18 DIAGNOSIS — M25562 Pain in left knee: Secondary | ICD-10-CM

## 2021-09-18 DIAGNOSIS — Z79899 Other long term (current) drug therapy: Secondary | ICD-10-CM | POA: Diagnosis not present

## 2021-09-18 DIAGNOSIS — G8929 Other chronic pain: Secondary | ICD-10-CM

## 2021-09-18 DIAGNOSIS — D862 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: Secondary | ICD-10-CM

## 2021-09-18 DIAGNOSIS — M25561 Pain in right knee: Secondary | ICD-10-CM

## 2021-09-18 DIAGNOSIS — R590 Localized enlarged lymph nodes: Secondary | ICD-10-CM

## 2021-09-18 NOTE — Telephone Encounter (Signed)
Will submit PA after OV from 09/18/2021 is signed. ?

## 2021-09-18 NOTE — Telephone Encounter (Signed)
Received request to begin Humira BIV. ?

## 2021-09-18 NOTE — Patient Instructions (Signed)
Adalimumab Injection What is this medication? ADALIMUMAB (ay da LIM yoo mab) is used to treat rheumatoid and psoriatic arthritis. It is also used to treat ankylosing spondylitis, Crohn's disease, ulcerative colitis, plaque psoriasis, hidradenitis suppurativa, and uveitis. This medicine may be used for other purposes; ask your health care provider or pharmacist if you have questions. COMMON BRAND NAME(S): AMJEVITA, Humira What should I tell my care team before I take this medication? They need to know if you have any of these conditions: cancer diabetes (high blood sugar) having surgery heart disease hepatitis B immune system problems infections, such as tuberculosis (TB) or other bacterial, fungal, or viral infections multiple sclerosis recent or upcoming vaccine an unusual reaction to adalimumab, mannitol, latex, rubber, other medicines, foods, dyes, or preservatives pregnant or trying to get pregnant breast-feeding How should I use this medication? This medicine is for injection under the skin. You will be taught how to prepare and give it. Take it as directed on the prescription label. Keep taking it unless your health care provider tells you to stop. It is important that you put your used needles and syringes in a special sharps container. Do not put them in a trash can. If you do not have a sharps container, call your pharmacist or health care provider to get one. This medicine comes with INSTRUCTIONS FOR USE. Ask your pharmacist for directions on how to use this medicine. Read the information carefully. Talk to your pharmacist or health care provider if you have questions. A special MedGuide will be given to you by the pharmacist with each prescription and refill. Be sure to read this information carefully each time. Talk to your pediatrician regarding the use of this medicine in children. While this drug may be prescribed for children as young as 2 years for selected conditions,  precautions do apply. Overdosage: If you think you have taken too much of this medicine contact a poison control center or emergency room at once. NOTE: This medicine is only for you. Do not share this medicine with others. What if I miss a dose? If you miss a dose, take it as soon as you can. If it is almost time for your next dose, take only that dose. Do not take double or extra doses. It is important not to miss any doses. Talk to your health care provider about what to do if you miss a dose. What may interact with this medication? Do not take this medicine with any of the following medications: abatacept anakinra biologic medicines such as certolizumab, etanercept, golimumab, infliximab live virus vaccines This medicine may also interact with the following medications: cyclosporine theophylline vaccines warfarin This list may not describe all possible interactions. Give your health care provider a list of all the medicines, herbs, non-prescription drugs, or dietary supplements you use. Also tell them if you smoke, drink alcohol, or use illegal drugs. Some items may interact with your medicine. What should I watch for while using this medication? Visit your health care provider for regular checks on your progress. Tell your health care provider if your symptoms do not start to get better or if they get worse. You will be tested for tuberculosis (TB) before you start this medicine. If your doctor prescribes any medicine for TB, you should start taking the TB medicine before starting this medicine. Make sure to finish the full course of TB medicine. This medicine may increase your risk of getting an infection. Call your health care provider for advice if you   get a fever, chills, sore throat, or other symptoms of a cold or flu. Do not treat yourself. Try to avoid being around people who are sick. Talk to your health care provider about your risk of cancer. You may be more at risk for certain  types of cancer if you take this medicine. What side effects may I notice from receiving this medication? Side effects that you should report to your doctor or health care professional as soon as possible: allergic reactions like skin rash, itching or hives, swelling of the face, lips, or tongue changes in vision chest pain dizziness heart failure (trouble breathing; fast, irregular heartbeat; sudden weight gain; swelling of the ankles, feet, hands; unusually weak or tired) infection (fever, chills, cough, sore throat, pain or trouble passing urine) liver injury (dark yellow or brown urine; general ill feeling or flu-like symptoms; loss of appetite, right upper belly pain; unusually weak or tired, yellowing of the eyes or skin) lump or swollen lymph nodes on the neck, groin, or underarm area muscle weakness pain, tingling, numbness in the hands or feet red, scaly patches or raised bumps on the skin trouble breathing unusual bleeding or bruising unusually weak or tired Side effects that usually do not require medical attention (report to your doctor or health care professional if they continue or are bothersome): headache nausea pain, redness, or irritation at site where injected stuffy or runny nose This list may not describe all possible side effects. Call your doctor for medical advice about side effects. You may report side effects to FDA at 1-800-FDA-1088. Where should I keep my medication? Keep out of the reach of children and pets. Store in the refrigerator between 2 and 8 degrees C (36 and 46 degrees F). Do not freeze. Keep this medicine in the original packaging until you are ready to take it. Protect from light. Get rid of any unused medicine after the expiration date. This medicine may be stored at room temperature for up to 14 days. Keep this medicine in the original packaging. Protect from light. If it is stored at room temperature, get rid of any unused medicine after 14 days  or after it expires, whichever is first. To get rid of medicines that are no longer needed or have expired: Take the medicine to a medicine take-back program. Check with your pharmacy or law enforcement to find a location. If you cannot return the medicine, ask your pharmacist or health care provider how to get rid of this medicine safely. NOTE: This sheet is a summary. It may not cover all possible information. If you have questions about this medicine, talk to your doctor, pharmacist, or health care provider.  2023 Elsevier/Gold Standard (2021-03-30 00:00:00)  

## 2021-09-18 NOTE — Telephone Encounter (Signed)
FYI - new start Humira. Thank you. ?

## 2021-09-28 ENCOUNTER — Other Ambulatory Visit (HOSPITAL_COMMUNITY): Payer: Self-pay

## 2021-09-28 NOTE — Telephone Encounter (Signed)
Received notification from [employer] regarding a prior authorization for HUMIRA. Authorization has been APPROVED from 09/28/21 to 09/28/22.   Per test claim, copay for 28 days supply is $1267.91  Patient can fill through [facility]: [phone]  Authorization # [id number]  Entered patient into [facility] Complete [facility] portal. Awaiting copay card information to populate.  Chesley Mires, PharmD, MPH, BCPS, CPP Clinical Pharmacist (Rheumatology and Pulmonology)

## 2021-09-28 NOTE — Telephone Encounter (Signed)
Signed.

## 2021-09-28 NOTE — Telephone Encounter (Signed)
Submitted a Prior Authorization request to [employer] for HUMIRA via CoverMyMeds. Will update once we receive a response.  Key:[id number]  Chesley Mires, PharmD, MPH, BCPS, CPP Clinical Pharmacist (Rheumatology and Pulmonology)

## 2021-10-01 ENCOUNTER — Encounter: Payer: Commercial Managed Care - HMO | Admitting: Registered Nurse

## 2021-10-01 NOTE — Telephone Encounter (Signed)
Humira copay card information populated in [facility] Complete [facility] portal:  ID: [id number] Rx GROUP: [id number] Rx BIN: [id number] Rx PCN: [id number]  ATC patient to review Humira approval and schedule new start visit. Unable to reach. Left VM requesting return call.  Knox Saliva, PharmD, MPH, BCPS, CPP Clinical Pharmacist (Rheumatology and Pulmonology)

## 2021-10-02 ENCOUNTER — Encounter: Payer: Commercial Managed Care - HMO | Attending: Physical Medicine and Rehabilitation | Admitting: Registered Nurse

## 2021-10-02 ENCOUNTER — Encounter: Payer: Self-pay | Admitting: Registered Nurse

## 2021-10-02 VITALS — BP 101/67 | HR 79 | Ht 68.0 in | Wt >= 6400 oz

## 2021-10-02 DIAGNOSIS — Z5181 Encounter for therapeutic drug level monitoring: Secondary | ICD-10-CM | POA: Insufficient documentation

## 2021-10-02 DIAGNOSIS — G894 Chronic pain syndrome: Secondary | ICD-10-CM | POA: Insufficient documentation

## 2021-10-02 DIAGNOSIS — Z79891 Long term (current) use of opiate analgesic: Secondary | ICD-10-CM | POA: Insufficient documentation

## 2021-10-02 DIAGNOSIS — M48062 Spinal stenosis, lumbar region with neurogenic claudication: Secondary | ICD-10-CM | POA: Insufficient documentation

## 2021-10-02 DIAGNOSIS — M25562 Pain in left knee: Secondary | ICD-10-CM | POA: Diagnosis present

## 2021-10-02 DIAGNOSIS — M25561 Pain in right knee: Secondary | ICD-10-CM | POA: Diagnosis present

## 2021-10-02 DIAGNOSIS — G8929 Other chronic pain: Secondary | ICD-10-CM | POA: Insufficient documentation

## 2021-10-02 MED ORDER — OXYCODONE-ACETAMINOPHEN 10-325 MG PO TABS: 1.0000 | ORAL_TABLET | Freq: Four times a day (QID) | ORAL | 0 refills | Status: DC | PRN

## 2021-10-02 NOTE — Progress Notes (Signed)
Subjective:    Patient ID: Anthony SpearingDaaron J Fabrizio, male    DOB: 10/29/1980, 41 y.o.   MRN: [id number]  HPI: Anthony [name] is a 41 y.o. male who returns for follow up appointment for chronic pain and medication refill. He states his pain is located in his lower back pain and bilateral knee pain. He rates his pain 7. His current exercise regime is walking and performing stretching exercises.  Mr. Vita BarleyHuntley Morphine equivalent is 60.00 MME.   Last UDS was Performed on 08/30/2021, it was consistent.     Pain Inventory Average Pain 6 Pain Right Now 7 My pain is constant, sharp, burning, dull, and aching  In the last 24 hours, has pain interfered with the following? General activity 10 Relation with others 10 Enjoyment of life 10 What TIME of day is your pain at its worst? morning , daytime, evening, and night Sleep (in general) Poor  Pain is worse with: walking, bending, sitting, inactivity, and standing Pain improves with: rest, heat/ice, and medication Relief from Meds: 5  Family History  Problem Relation Age of Onset   Hypertension Father    Diabetes Father    Stroke Father    Congestive Heart Failure Father    Lymphoma Paternal Uncle    Ovarian cancer Maternal Grandmother [id number]   Social History   Socioeconomic History   Marital status: Married    Spouse name: Not on file   Number of children: Not on file   Years of education: Not on file   Highest education level: Not on file  Occupational History   Occupation: Disabled  Tobacco Use   Smoking status: Former    Packs/day: 0.30    Years: 10.00    Pack years: 3.00    Types: Cigarettes    Quit date: 09/30/2016    Years since quitting: 5.0    Passive exposure: Never   Smokeless tobacco: Never  Vaping Use   Vaping Use: Never used  Substance and Sexual Activity   Alcohol use: No   Drug use: No   Sexual activity: Yes    Birth control/protection: Condom  Other Topics Concern   Not on file  Social History Narrative   Not  on file   Social Determinants of Health   Financial Resource Strain: Not on file  Food Insecurity: Not on file  Transportation Needs: Not on file  Physical Activity: Not on file  Stress: Not on file  Social Connections: Not on file   Past Surgical History:  Procedure Laterality Date   ARTHROSCOPY KNEE W/ DRILLING     right knee    ENDOBRONCHIAL ULTRASOUND Bilateral 03/04/2017   Procedure: ENDOBRONCHIAL ULTRASOUND;  Surgeon: Leslye PeerByrum, Robert S, MD;  Location: [facility];  Service: Cardiopulmonary;  Laterality: Bilateral;   left knee inner growth plate removed     to straighten leg   Past Surgical History:  Procedure Laterality Date   ARTHROSCOPY KNEE W/ DRILLING     right knee    ENDOBRONCHIAL ULTRASOUND Bilateral 03/04/2017   Procedure: ENDOBRONCHIAL ULTRASOUND;  Surgeon: Leslye PeerByrum, Robert S, MD;  Location: [facility];  Service: Cardiopulmonary;  Laterality: Bilateral;   left knee inner growth plate removed     to straighten leg   Past Medical History:  Diagnosis Date   Anxiety    Depression    Dyspnea    Eczema of hand    Headache    hx of with tooth pain   Obese    Pneumonia  12/2016   Pre-diabetes    Sarcoidosis    Sleep apnea    cpap   BP 101/67   Pulse 79   Ht  (1.727 m)   Wt (!) 570 lb 3.2 oz (258.6 kg)   SpO2 98%   BMI 86.70 kg/m   Opioid Risk Score:   Fall Risk Score:  `1  Depression screen [facility] 2/9     10/02/2021    9:41 AM 08/30/2021    8:36 AM 08/01/2021    8:30 AM 07/06/2021    8:43 AM 06/07/2021   10:15 AM 05/11/2021    8:17 AM 03/06/2021    8:51 AM  Depression screen PHQ 2/9  Decreased Interest 0 1 0 1  Down, Depressed, Hopeless 0 PHQ - 2 Score 0 Review of Systems  Constitutional: Negative.   HENT: Negative.    Eyes: Negative.   Respiratory: Negative.    Cardiovascular: Negative.   Gastrointestinal: Negative.   Endocrine: Negative.   Genitourinary: Negative.   Musculoskeletal:  Positive for  back pain.  Skin: Negative.   Allergic/Immunologic: Negative.   Neurological: Negative.   Hematological: Negative.   Psychiatric/Behavioral:  Positive for sleep disturbance.       Objective:   Physical Exam Vitals and nursing note reviewed.  Constitutional:      Appearance: Normal appearance.  Cardiovascular:     Rate and Rhythm: Normal rate and regular rhythm.     Pulses: Normal pulses.     Heart sounds: Normal heart sounds.  Pulmonary:     Effort: Pulmonary effort is normal.     Breath sounds: Normal breath sounds.  Musculoskeletal:     Cervical back: Normal range of motion and neck supple.     Comments: Normal Muscle Bulk and Muscle Testing Reveals:  Upper Extremities: Full ROM and Muscle Strength 5/5 Lumbar Paraspinal Tenderness: L-4- L-5 Lower Extremities: Decreased ROM and Muscle Strength 5/5 Bilateral Lower Extremities Flexion Produces Pain into his Bilateral Patella's Arises from Table Slowly  Antalgic Gait     Skin:    General: Skin is warm and dry.  Neurological:     Mental Status: He is alert and oriented to person, place, and time.  Psychiatric:        Mood and Affect: Mood normal.        Behavior: Behavior normal.         Assessment & Plan:  1.Right Lumbar Radiculitis/ Spinal Stenosis : Continue HEP as Tolerated and Continue to Monitor. 10/02/2021 Refilled: :Oxycodone 10/325 mg one tablet 4 times a day as needed for pain #120. We will continue the opioid monitoring program, this consists of regular clinic visits, examinations, urine drug screen, pill counts as well as use of [place] Controlled Substance Reporting system. A 12 month History has been reviewed on the [place] Controlled Substance Reporting System on 10/02/2021.  2. Thoracic Back Pain: No complaints today.Continue HEP as Tolerated . Continue to Monitor. 10/02/2021 3. Right Shoulder Pain: No Complaints today. Continue HEP as Tolerated and Continue to Monitor.10/02/2021 4. Chronic  Pain Syndrome: Continue HEP as Tolerated. Continue to Monitor. 10/02/2021 5. Morbid Obesity: Continue Healthy Diet Regimen. Continue to Monitor. 10/02/2021 6. Chronic Bilateral Knee Pain:Continue HEP as Tolerated. Continue to Monitor. 10/02/2021  F/U in 1 Month

## 2021-10-04 NOTE — Telephone Encounter (Signed)
Spoke with patient regarding Humira. Scheduled for Humira new start on Tuesday, 10/09/21. Will use sample.  Chesley Mires, PharmD, MPH, BCPS, CPP Clinical Pharmacist (Rheumatology and Pulmonology)

## 2021-10-09 ENCOUNTER — Ambulatory Visit (INDEPENDENT_AMBULATORY_CARE_PROVIDER_SITE_OTHER): Payer: Commercial Managed Care - HMO | Admitting: Pharmacist

## 2021-10-09 ENCOUNTER — Ambulatory Visit: Payer: Commercial Managed Care - HMO | Admitting: Pharmacist

## 2021-10-09 VITALS — BP 149/83 | HR 65

## 2021-10-09 DIAGNOSIS — D862 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: Secondary | ICD-10-CM

## 2021-10-09 DIAGNOSIS — Z7189 Other specified counseling: Secondary | ICD-10-CM

## 2021-10-09 DIAGNOSIS — Z79899 Other long term (current) drug therapy: Secondary | ICD-10-CM

## 2021-10-09 MED ORDER — HUMIRA (2 PEN) 40 MG/0.4ML ~~LOC~~ AJKT: 40.0000 mg | AUTO-INJECTOR | SUBCUTANEOUS | 2 refills | Status: DC

## 2021-10-09 NOTE — Progress Notes (Signed)
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  He is currently taking prednisone  once daily.  Patient running a fever or have signs/symptoms of infection? No  Patient currently on antibiotics for the treatment of infection? No  Patient have any upcoming invasive procedures/surgeries? No  Objective: CMP     Component Value Date/Time   NA 140 08/27/2021 0858   NA 138 02/17/2017 1111   K 4.2 08/27/2021 0858   K 4.2 02/17/2017 1111   CL 103 08/27/2021 0858   CO2 28 08/27/2021 0858   CO2 28 02/17/2017 1111   GLUCOSE 97 08/27/2021 0858   GLUCOSE 91 02/17/2017 1111   BUN 11 08/27/2021 0858   BUN 7.1 02/17/2017 1111   CREATININE 1.02 08/27/2021 0858   CREATININE 0.9 02/17/2017 1111   CALCIUM 9.4 08/27/2021 0858   CALCIUM 9.6 02/17/2017 1111   PROT 7.7 08/27/2021 0858   PROT 8.1 02/17/2017 1111   ALBUMIN 4.1 05/10/2021 1422   ALBUMIN 3.5 02/17/2017 1111   AST 18 08/27/2021 0858   AST 26 02/17/2017 1111   ALT 33 08/27/2021 0858   ALT 27 02/17/2017 1111   ALKPHOS 66 05/10/2021 1422   ALKPHOS 78 02/17/2017 1111   BILITOT 0.6 08/27/2021 0858   BILITOT 0.55 02/17/2017 1111   GFRNONAA >60 01/02/2017 1805   GFRAA >60 01/02/2017 1805    CBC    Component Value Date/Time   WBC 9.5 08/27/2021 0858   RBC 5.18 08/27/2021 0858   HGB 16.0 08/27/2021 0858   HGB 14.6 02/17/2017 1111   HCT 47.7 08/27/2021 0858   HCT 44.3 02/17/2017 1111   PLT 290 08/27/2021 0858   PLT 278 02/17/2017 1111   MCV 92.1 08/27/2021 0858   MCV 88.0 02/17/2017 1111   MCH 30.9 08/27/2021 0858   MCHC 33.5 08/27/2021 0858   RDW 13.2 08/27/2021 0858   RDW 14.1 02/17/2017 1111   LYMPHSABS 2,489 08/27/2021 0858   LYMPHSABS 1.5 02/17/2017 1111   MONOABS 0.7 05/10/2021 1422   MONOABS 1.0 (H) 02/17/2017 1111   EOSABS 133 08/27/2021 0858   EOSABS 0.4 02/17/2017 1111   BASOSABS 76 08/27/2021 0858   BASOSABS 0.1 02/17/2017 1111     Baseline Immunosuppressant Therapy Labs TB GOLD    Latest Ref Rng & Units 08/27/2021    8:58 AM  Quantiferon TB Gold  Quantiferon TB Gold Plus NEGATIVE NEGATIVE     Hepatitis Panel    Latest Ref Rng & Units 08/27/2021    8:58 AM  Hepatitis  Hep B Surface Ag NON-REACTIVE NON-REACTIVE    Hep B IgM NON-REACTIVE NON-REACTIVE    Hep C Ab NON-REACTIVE NON-REACTIVE     HIV Lab Results  Component Value Date   HIV NON-REACTIVE 08/27/2021   Immunoglobulins   SPEP    Latest Ref Rng & Units 08/27/2021    8:58 AM  Serum Protein Electrophoresis  Total Protein 6.1 - 8.1 g/dL 7.7     [id number] No results found for: G6PDH TPMT Lab Results  Component Value Date   TPMT 9 (L) 08/27/2021     Chest x-ray: 01/23/18 - Persistent reticulonodular interstitial prominence without progression. No consolidation. No new or enlarging nodular opacity. Heart upper normal in size. No overt adenopathy by radiography.  Assessment/Plan:  Demonstrated proper injection technique with Humira demo device  Patient able to demonstrate proper injection technique using the teach back method.  Patient self injected in the  left lower abdomen with:  Sample Medication: Humira 40mg /0.36mL autoinjector pen NDC: [phone] Lot: [id number] Expiration: 11/2022  Patient tolerated well.  Observed for 30 mins in office for adverse reaction and none noted.   Patient is to return in 1 month for labs and 6-8 weeks for follow-up appointment.  Standing orders placed.   Humira approved through insurance .   Rx sent to: [facility]: [phone].  Patient provided with pharmacy phone number and advised to call later this week to schedule shipment to home.  He will continue Humira 40mg  SQ every 14 days. Referral to dermatology placed today for yearly skin checks while on TNF inhibitor due to risk for non-melanoma skin cance.r  All questions encouraged and answered.  Instructed patient to call with any further  questions or concerns.  Chesley Mires, PharmD, MPH, BCPS, CPP Clinical Pharmacist (Rheumatology and Pulmonology)  10/09/2021 9:59 AM

## 2021-10-09 NOTE — Patient Instructions (Incomplete)
Your next HUMIRA dose is due on 10/23/21, 11/06/21, and every 14 days thereafter  HOLD HUMIRA if you have signs or symptoms of an infection. You can resume once you feel better or back to your baseline. HOLD HUMIRA if you start antibiotics to treat an infection. HOLD HUMIRA around the time of surgery/procedures. Your surgeon will be able to provide recommendations on when to hold BEFORE and when you are cleared to RESUME.  Pharmacy information: Your prescription will be shipped from [facility]. Their phone number is [phone] Please call to schedule shipment and confirm address. They will mail your medication to your home.  Cost information: Your copay should be affordable. If you call the pharmacy and it is not affordable, please double-check that they are billing through your copay card as secondary coverage. That copay card information is: ID: [id number] Rx GROUP: [id number] Rx BIN: [id number] Rx PCN: [id number]  Labs are due in 1 month then every 3 months. Lab hours are from Monday to Thursday 1:30-4:30pm and Friday 1:30-4pm. You do not need an appointment if you come for labs during these times.  How to manage an injection site reaction: Remember the 5 C's: COUNTER - leave on the counter at least 30 minutes but up to overnight to bring medication to room temperature. This may help prevent stinging COLD - place something cold (like an ice gel pack or cold water bottle) on the injection site just before cleansing with alcohol. This may help reduce pain CLARITIN - use Claritin (generic name is loratadine) for the first two weeks of treatment or the day of, the day before, and the day after injecting. This will help to minimize injection site reactions CORTISONE CREAM - apply if injection site is irritated and itching CALL ME - if injection site reaction is bigger than the size of your fist, looks infected, blisters, or if you develop hives

## 2021-10-09 NOTE — Patient Instructions (Addendum)
Your next HUMIRA dose is due on 10/23/21, 11/06/21, and every 14 days thereafter  CONTINUE prednisone as prescribed  HOLD HUMIRA if you have signs or symptoms of an infection. You can resume once you feel better or back to your baseline. HOLD HUMIRA if you start antibiotics to treat an infection. HOLD HUMIRA around the time of surgery/procedures. Your surgeon will be able to provide recommendations on when to hold BEFORE and when you are cleared to RESUME.  Pharmacy information: Your prescription will be shipped from [facility]. Their phone number is [phone] Please call to schedule shipment and confirm address. They will mail your medication to your home.  Cost information: Your copay should be affordable. If you call the pharmacy and it is not affordable, please double-check that they are billing through your copay card as secondary coverage. That copay card information is: ID: [id number] Rx GROUP: [id number] Rx BIN: [id number] Rx PCN: [id number]  Labs are due in 1 month then every 3 months. Lab hours are from Monday to Thursday 1:30-4:30pm and Friday 1:30-4pm. You do not need an appointment if you come for labs during these times.  How to manage an injection site reaction: Remember the 5 C's: COUNTER - leave on the counter at least 30 minutes but up to overnight to bring medication to room temperature. This may help prevent stinging COLD - place something cold (like an ice gel pack or cold water bottle) on the injection site just before cleansing with alcohol. This may help reduce pain CLARITIN - use Claritin (generic name is loratadine) for the first two weeks of treatment or the day of, the day before, and the day after injecting. This will help to minimize injection site reactions CORTISONE CREAM - apply if injection site is irritated and itching CALL ME - if injection site reaction is bigger than the size of your fist, looks infected, blisters, or if you develop hives

## 2021-10-09 NOTE — Progress Notes (Deleted)
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 Previous therapi includes MTX with inadequate response.  Patient running a fever or have signs/symptoms of infection? {yes/no:20286}  Patient currently on antibiotics for the treatment of infection? {yes/no:20286}  Patient have any upcoming invasive procedures/surgeries? {yes/no:20286}  Objective: CMP     Component Value Date/Time   NA 140 08/27/2021 0858   NA 138 02/17/2017 1111   K 4.2 08/27/2021 0858   K 4.2 02/17/2017 1111   CL 103 08/27/2021 0858   CO2 28 08/27/2021 0858   CO2 28 02/17/2017 1111   GLUCOSE 97 08/27/2021 0858   GLUCOSE 91 02/17/2017 1111   BUN 11 08/27/2021 0858   BUN 7.1 02/17/2017 1111   CREATININE 1.02 08/27/2021 0858   CREATININE 0.9 02/17/2017 1111   CALCIUM 9.4 08/27/2021 0858   CALCIUM 9.6 02/17/2017 1111   PROT 7.7 08/27/2021 0858   PROT 8.1 02/17/2017 1111   ALBUMIN 4.1 05/10/2021 1422   ALBUMIN 3.5 02/17/2017 1111   AST 18 08/27/2021 0858   AST 26 02/17/2017 1111   ALT 33 08/27/2021 0858   ALT 27 02/17/2017 1111   ALKPHOS 66 05/10/2021 1422   ALKPHOS 78 02/17/2017 1111   BILITOT 0.6 08/27/2021 0858   BILITOT 0.55 02/17/2017 1111   GFRNONAA >60 01/02/2017 1805   GFRAA >60 01/02/2017 1805    CBC    Component Value Date/Time   WBC 9.5 08/27/2021 0858   RBC 5.18 08/27/2021 0858   HGB 16.0 08/27/2021 0858   HGB 14.6 02/17/2017 1111   HCT 47.7 08/27/2021 0858   HCT 44.3 02/17/2017 1111   PLT 290 08/27/2021 0858   PLT 278 02/17/2017 1111   MCV 92.1 08/27/2021 0858   MCV 88.0 02/17/2017 1111   MCH 30.9 08/27/2021 0858   MCHC 33.5 08/27/2021 0858   RDW 13.2 08/27/2021 0858   RDW 14.1 02/17/2017 1111   LYMPHSABS 2,489 08/27/2021 0858   LYMPHSABS 1.5 02/17/2017 1111   MONOABS 0.7 05/10/2021 1422   MONOABS 1.0 (H) 02/17/2017 1111   EOSABS 133 08/27/2021 0858   EOSABS 0.4 02/17/2017 1111   BASOSABS 76 08/27/2021  0858   BASOSABS 0.1 02/17/2017 1111    Baseline Immunosuppressant Therapy Labs TB GOLD    Latest Ref Rng & Units 08/27/2021    8:58 AM  Quantiferon TB Gold  Quantiferon TB Gold Plus NEGATIVE NEGATIVE     Hepatitis Panel    Latest Ref Rng & Units 08/27/2021    8:58 AM  Hepatitis  Hep B Surface Ag NON-REACTIVE NON-REACTIVE    Hep B IgM NON-REACTIVE NON-REACTIVE    Hep C Ab NON-REACTIVE NON-REACTIVE     HIV Lab Results  Component Value Date   HIV NON-REACTIVE 08/27/2021   Immunoglobulins   SPEP    Latest Ref Rng & Units 08/27/2021    8:58 AM  Serum Protein Electrophoresis  Total Protein 6.1 - 8.1 g/dL 7.7     U8QB No results found for: G6PDH TPMT Lab Results  Component Value Date   TPMT 9 (L) 08/27/2021     Chest x-ray: 01/23/18 - Persistent reticulonodular interstitial prominence without progression. No consolidation. No new or enlarging nodular opacity. Heart upper normal in size. No overt adenopathy by radiography.   Assessment/Plan:  Demonstrated proper injection technique with Humira demo device  Patient able to demonstrate proper injection technique using the teach back method.  Patient self injected in the ****  with:  Sample Medication: *** NDC: *** Lot: *** Expiration: **  Patient tolerated well.  Observed for 30 mins in office for adverse reaction and ***.   Patient is to return in 1 month for labs and 6-8 weeks for follow-up appointment.  Standing orders placed.   Humira approved through insurance .   Rx sent to: [facility]: [phone].  Patient provided with pharmacy phone number and advised to call later this week to schedule shipment to home.  He will continue Humira  SQ every 14 days.  All questions encouraged and answered.  Instructed patient to call with any further questions or concerns.  Chesley Mires, PharmD, MPH, BCPS Clinical Pharmacist (Rheumatology and Pulmonology)  10/09/2021 7:59 AM

## 2021-10-24 ENCOUNTER — Encounter: Payer: Managed Care, Other (non HMO) | Admitting: Nurse Practitioner

## 2021-10-27 ENCOUNTER — Other Ambulatory Visit: Payer: Self-pay | Admitting: Physical Medicine and Rehabilitation

## 2021-11-01 ENCOUNTER — Encounter: Payer: Commercial Managed Care - HMO | Admitting: Registered Nurse

## 2021-11-01 ENCOUNTER — Encounter: Payer: Commercial Managed Care - HMO | Attending: Physical Medicine and Rehabilitation | Admitting: Registered Nurse

## 2021-11-01 ENCOUNTER — Encounter: Payer: Self-pay | Admitting: Registered Nurse

## 2021-11-01 VITALS — BP 100/61 | HR 92 | Ht 68.0 in | Wt >= 6400 oz

## 2021-11-01 DIAGNOSIS — Z79891 Long term (current) use of opiate analgesic: Secondary | ICD-10-CM | POA: Diagnosis present

## 2021-11-01 DIAGNOSIS — G8929 Other chronic pain: Secondary | ICD-10-CM

## 2021-11-01 DIAGNOSIS — Z5181 Encounter for therapeutic drug level monitoring: Secondary | ICD-10-CM | POA: Diagnosis present

## 2021-11-01 DIAGNOSIS — M25562 Pain in left knee: Secondary | ICD-10-CM

## 2021-11-01 DIAGNOSIS — G894 Chronic pain syndrome: Secondary | ICD-10-CM

## 2021-11-01 DIAGNOSIS — M25561 Pain in right knee: Secondary | ICD-10-CM | POA: Diagnosis present

## 2021-11-01 DIAGNOSIS — M546 Pain in thoracic spine: Secondary | ICD-10-CM | POA: Diagnosis present

## 2021-11-01 DIAGNOSIS — M48062 Spinal stenosis, lumbar region with neurogenic claudication: Secondary | ICD-10-CM

## 2021-11-01 MED ORDER — OXYCODONE-ACETAMINOPHEN 10-325 MG PO TABS: 1.0000 | ORAL_TABLET | Freq: Four times a day (QID) | ORAL | 0 refills | Status: DC | PRN

## 2021-11-01 NOTE — Progress Notes (Signed)
Subjective:    Patient ID: Anthony [name], male    DOB: 08/24/1980, 41 y.o.   MRN: [id number]  HPI: Anthony [name] is a 41 y.o. male who returns for follow up appointment for chronic pain and medication refill. He states his pain is located in his upper- lower back and bilateral knee pain.He  rates his pain 7. His current exercise regime is walking and performing stretching exercises.  Anthony [name] Morphine equivalent is 60.00 MME.   Last UDS was Performed on 08/30/2021, it was consistent.    Vitals: 100/61 P: 91 Oxygen Saturation 96%   Pain Inventory Average Pain 7 Pain Right Now 7 My pain is constant, sharp, burning, dull, stabbing, and aching  In the last 24 hours, has pain interfered with the following? General activity 10 Relation with others 10 Enjoyment of life 10 What TIME of day is your pain at its worst? morning , daytime, evening, and night Sleep (in general) Poor  Pain is worse with: walking, bending, sitting, inactivity, and standing Pain improves with: rest, heat/ice, and medication Relief from Meds: 5  Family History  Problem Relation Age of Onset   Hypertension Father    Diabetes Father    Stroke Father    Congestive Heart Failure Father    Lymphoma Paternal Uncle    Ovarian cancer Maternal Grandmother 46   Social History   Socioeconomic History   Marital status: Married    Spouse name: Not on file   Number of children: Not on file   Years of education: Not on file   Highest education level: Not on file  Occupational History   Occupation: Disabled  Tobacco Use   Smoking status: Former    Packs/day: 0.30    Years: 10.00    Total pack years: 3.00    Types: Cigarettes    Quit date: 09/30/2016    Years since quitting: 5.0    Passive exposure: Never   Smokeless tobacco: Never  Vaping Use   Vaping Use: Never used  Substance and Sexual Activity   Alcohol use: No   Drug use: No   Sexual activity: Yes    Birth control/protection: Condom  Other  Topics Concern   Not on file  Social History Narrative   Not on file   Social Determinants of Health   Financial Resource Strain: Not on file  Food Insecurity: Not on file  Transportation Needs: Not on file  Physical Activity: Not on file  Stress: Not on file  Social Connections: Not on file   Past Surgical History:  Procedure Laterality Date   ARTHROSCOPY KNEE W/ DRILLING     right knee    ENDOBRONCHIAL ULTRASOUND Bilateral 03/04/2017   Procedure: ENDOBRONCHIAL ULTRASOUND;  Surgeon: Leslye Peer, MD;  Location: [facility];  Service: Cardiopulmonary;  Laterality: Bilateral;   left knee inner growth plate removed     to straighten leg   Past Surgical History:  Procedure Laterality Date   ARTHROSCOPY KNEE W/ DRILLING     right knee    ENDOBRONCHIAL ULTRASOUND Bilateral 03/04/2017   Procedure: ENDOBRONCHIAL ULTRASOUND;  Surgeon: Leslye Peer, MD;  Location: [facility];  Service: Cardiopulmonary;  Laterality: Bilateral;   left knee inner growth plate removed     to straighten leg   Past Medical History:  Diagnosis Date   Anxiety    Depression    Dyspnea    Eczema of hand    Headache    hx of with tooth  pain   Obese    Pneumonia    12/2016   Pre-diabetes    Sarcoidosis    Sleep apnea    cpap   BP (!) 151/77   Pulse 92   Ht 5\' 8"  (1.727 m)   Wt (!) 571 lb (259 kg)   SpO2 97%   BMI 86.82 kg/m   Opioid Risk Score:   Fall Risk Score:  `1  Depression screen [facility] 2/9     11/01/2021   10:25 AM 10/02/2021    9:41 AM 08/30/2021    8:36 AM 08/01/2021    8:30 AM 07/06/2021    8:43 AM 06/07/2021   10:15 AM 05/11/2021    8:17 AM  Depression screen PHQ 2/9  Decreased Interest 0 1 1 1  0 1 0  Down, Depressed, Hopeless 0 1 1 1  0 1 1  PHQ - 2 Score 0 2 2 2  0 2 1    Review of Systems  Musculoskeletal:  Positive for gait problem and neck pain.       Right leg pain, pain in both knees  All other systems reviewed and are negative.      Objective:   Physical  Exam Vitals and nursing note reviewed.  Constitutional:      Appearance: Normal appearance. He is obese.  Cardiovascular:     Rate and Rhythm: Normal rate and regular rhythm.     Pulses: Normal pulses.     Heart sounds: Normal heart sounds.  Pulmonary:     Effort: Pulmonary effort is normal.     Breath sounds: Normal breath sounds.  Musculoskeletal:     Cervical back: Normal range of motion and neck supple.     Comments: Normal Muscle Bulk and Muscle Testing Reveals:  Upper Extremities:Full  ROM and Muscle Strength 5/5 Thoracic Paraspinal Tenderness: T-7-T-9 Lumbar Paraspinal Tenderness: L-4-L-5 Lower Extremities: Full ROM and Muscle Strength 5/5 Arises from Table Slowly Antalgic  Gait     Skin:    General: Skin is warm and dry.  Neurological:     Mental Status: He is alert and oriented to person, place, and time.  Psychiatric:        Mood and Affect: Mood normal.        Behavior: Behavior normal.         Assessment & Plan:  1.Right Lumbar Radiculitis/ Spinal Stenosis : Continue HEP as Tolerated and Continue to Monitor. 11/01/2021 Refilled: :Oxycodone 10/325 mg one tablet 4 times a day as needed for pain #120. We will continue the opioid monitoring program, this consists of regular clinic visits, examinations, urine drug screen, pill counts as well as use of Controlled Substance Reporting system. A 12 month History has been reviewed on the Controlled Substance Reporting System on 11/01/2021.  2. Thoracic Back Pain: No complaints today.Continue HEP as Tolerated . Continue to Monitor. 11/01/2021 3. Right Shoulder Pain: No Complaints today. Continue HEP as Tolerated and Continue to Monitor.11/01/2021 4. Chronic Pain Syndrome: Continue HEP as Tolerated. Continue to Monitor. 11/01/2021 5. Morbid Obesity: Continue Healthy Diet Regimen. Continue to Monitor. 11/01/2021 6. Chronic Bilateral Knee Pain:Continue HEP as Tolerated. Continue to Monitor.  11/01/2021   F/U in 1 Month

## 2021-11-01 NOTE — Patient Instructions (Signed)
Meloxicam / Mobic

## 2021-11-02 ENCOUNTER — Ambulatory Visit: Payer: Commercial Managed Care - HMO | Admitting: Internal Medicine

## 2021-11-02 NOTE — Progress Notes (Deleted)
Office Visit Note  Patient: Anthony [name]             Date of Birth: 15-Apr-1981           MRN: [id number]             PCP: Pcp, No Referring: No ref. provider found Visit Date: 11/02/2021   Subjective:  No chief complaint on file.   History of Present Illness: Anthony [name] is a 41 y.o. 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 He is feeling about the same as at our last visit still join pain worst in bilateral knees and skin rash is present on both palms. Labs from last visit were mostly normal although also showed low TPMT function test.   Previous HPI 08/27/2021 Anthony [name] is a 41 y.o. 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 He previously took methotrexate without definite symptom control. Recently increased back to 30 mg prednisone tapering. He has some chronic joint pain in multiple areas including chronic rotator cuff tear and lumbar spine DJD.  He has longstanding disease with chronic joint pain in both knees since he was a teenager. This has been attributed to use related and also found to have significant OA on xrays from a few years ago.  Skin disease is also been chronic mostly affecting the palms of bilateral hands.  However starting around 2018 he developed shortness of breath symptoms as well as worsening skin inflammation this led to original diagnosis of sarcoidosis.  He has received off-and-on treatments of prednisone which improved his symptoms significantly.  He did take methotrexate however feels like his joints and skin may have been not improved or possibly worse when on the medication definitely do not see an improvement in symptoms.  More recently in the  past year he is also developed some hair loss and pigmentation changes on the scalp.  He has hyperpigmented skin patches on distal legs and developed a right leg ulcer this has been healing over the past 3 weeks following up with wound care treatment is also been on the prednisone during this time.  The most recent respiratory symptom exacerbation in the past few months now on prednisone for least about 6 weeks slowly tapering.  He was recommended for updated chest CT scan is had be rescheduled to a different site for machine weight capacity limit he has not yet gotten this rescheduled again. He denies any involvement of skin on soles of the feet like he has on his hands.  He has used hypotensive topical steroids for this in the past but not recently.  He denies any eye inflammation problems or vision changes.   09/10/2017 CT Chest IMPRESSION: 1. Marked decrease in size of RIGHT perihilar pulmonary parenchymal nodules and LEFT upper lobe nodule with linear platelike scarring remaining. 2. Mild mediastinal adenopathy is similar to slightly decreased.   No Rheumatology ROS completed.   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) 02/24/2017   Abnormal chest x-ray with multiple lung nodules 02/08/2017   Reactive depression 01/16/2017   Anticoagulated by anticoagulation treatment 01/16/2017    Past Medical History:  Diagnosis Date   Anxiety    Depression    Dyspnea    Eczema of hand    Headache    hx of with tooth pain   Obese    Pneumonia    12/2016    Pre-diabetes    Sarcoidosis    Sleep apnea    cpap    Family History  Problem Relation Age of Onset   Hypertension Father    Diabetes Father    Stroke Father    Congestive Heart Failure Father    Lymphoma Paternal Uncle    Ovarian cancer Maternal Grandmother 58   Past Surgical History:  Procedure Laterality Date   ARTHROSCOPY KNEE W/ DRILLING     right knee    ENDOBRONCHIAL ULTRASOUND Bilateral 03/04/2017   Procedure: ENDOBRONCHIAL ULTRASOUND;  Surgeon: Leslye Peer, MD;  Location: [facility];  Service: Cardiopulmonary;  Laterality: Bilateral;   left knee inner growth plate removed     to straighten leg   Social History   Social History Narrative   Not on file    There is no immunization history on file for this patient.   Objective: Vital Signs: There were no vitals taken for this visit.   Physical Exam   Musculoskeletal Exam: ***  CDAI Exam: CDAI Score: -- Patient Global: --; Provider Global: -- Swollen: --; Tender: -- Joint Exam 11/02/2021   No joint exam has been documented for this visit   There is currently no information documented on the homunculus. Go to the Rheumatology activity and complete the homunculus joint exam.  Investigation: No additional findings.  Imaging: No results found.  Recent Labs: Lab Results  Component Value Date   WBC 9.5 08/27/2021   HGB 16.0 08/27/2021   PLT 290 08/27/2021   NA 140 08/27/2021   K 4.2 08/27/2021   CL 103 08/27/2021   CO2 28 08/27/2021   GLUCOSE 97 08/27/2021   BUN 11 08/27/2021   CREATININE 1.02 08/27/2021   BILITOT 0.6 08/27/2021   ALKPHOS 66 05/10/2021   AST 18 08/27/2021   ALT 33 08/27/2021   PROT 7.7 08/27/2021   ALBUMIN 4.1 05/10/2021   CALCIUM 9.4 08/27/2021   GFRAA >60 01/02/2017   QFTBGOLDPLUS NEGATIVE 08/27/2021    Speciality Comments: No specialty comments available.  Procedures:  No procedures performed Allergies: Xarelto [rivaroxaban]   Assessment / Plan:     Visit  Diagnoses: No diagnosis found.  ***  Orders: No orders of the defined types were placed in this encounter.  No orders of the defined types were placed in this encounter.    Follow-Up Instructions: No follow-ups on file.   Fuller Plan, MD  Note - This record has been created using [employer].  Chart creation errors have been sought, but may not always  have been located. Such creation errors do not reflect on  the standard of medical care.

## 2021-11-12 ENCOUNTER — Other Ambulatory Visit: Payer: Self-pay | Admitting: Pulmonary Disease

## 2021-11-13 ENCOUNTER — Encounter: Payer: Self-pay | Admitting: Registered Nurse

## 2021-11-29 ENCOUNTER — Encounter: Payer: Commercial Managed Care - HMO | Attending: Physical Medicine and Rehabilitation | Admitting: Registered Nurse

## 2021-11-29 ENCOUNTER — Encounter: Payer: Self-pay | Admitting: Registered Nurse

## 2021-11-29 VITALS — BP 105/70 | HR 63 | Ht 68.0 in | Wt >= 6400 oz

## 2021-11-29 DIAGNOSIS — M25561 Pain in right knee: Secondary | ICD-10-CM | POA: Diagnosis not present

## 2021-11-29 DIAGNOSIS — M48062 Spinal stenosis, lumbar region with neurogenic claudication: Secondary | ICD-10-CM | POA: Insufficient documentation

## 2021-11-29 DIAGNOSIS — G8929 Other chronic pain: Secondary | ICD-10-CM | POA: Insufficient documentation

## 2021-11-29 DIAGNOSIS — M25562 Pain in left knee: Secondary | ICD-10-CM | POA: Diagnosis present

## 2021-11-29 DIAGNOSIS — Z5181 Encounter for therapeutic drug level monitoring: Secondary | ICD-10-CM | POA: Diagnosis not present

## 2021-11-29 DIAGNOSIS — Z79891 Long term (current) use of opiate analgesic: Secondary | ICD-10-CM | POA: Insufficient documentation

## 2021-11-29 DIAGNOSIS — G894 Chronic pain syndrome: Secondary | ICD-10-CM | POA: Insufficient documentation

## 2021-11-29 MED ORDER — OXYCODONE-ACETAMINOPHEN 10-325 MG PO TABS: 1.0000 | ORAL_TABLET | Freq: Four times a day (QID) | ORAL | 0 refills | Status: DC | PRN

## 2021-11-29 NOTE — Progress Notes (Signed)
Subjective:    Patient ID: Anthony [name], male    DOB: [birth year], 41 y.o.   MRN: [id number]  HPI: Anthony [name] is a 41 y.o. male who returns for follow up appointment for chronic pain and medication refill. He states his  pain is located in his lower back and bilateral knees R>L. He rates his pain 5 .His  current exercise regime is walking short distances.  Anthony [name] Morphine equivalent is 60.00 MME.   Last UDS was Performed on 04/20/202, it was consistent.      Pain Inventory Average Pain 6 Pain Right Now 5 My pain is intermittent, sharp, burning, dull, stabbing, and aching  In the last 24 hours, has pain interfered with the following? General activity 10 Relation with others 10 Enjoyment of life 10 What TIME of day is your pain at its worst? varies Sleep (in general) Poor  Pain is worse with: walking, bending, sitting, inactivity, standing, and some activites Pain improves with: rest and medication Relief from Meds: 5  Family History  Problem Relation Age of Onset   Hypertension Father    Diabetes Father    Stroke Father    Congestive Heart Failure Father    Lymphoma Paternal Uncle    Ovarian cancer Maternal Grandmother 42   Social History   Socioeconomic History   Marital status: Married    Spouse name: Not on file   Number of children: Not on file   Years of education: Not on file   Highest education level: Not on file  Occupational History   Occupation: Disabled  Tobacco Use   Smoking status: Former    Packs/day: 0.30    Years: 10.00    Total pack years: 3.00    Types: Cigarettes    Quit date: 09/30/2016    Years since quitting: 5.1    Passive exposure: Never   Smokeless tobacco: Never  Vaping Use   Vaping Use: Never used  Substance and Sexual Activity   Alcohol use: No   Drug use: No   Sexual activity: Yes    Birth control/protection: Condom  Other Topics Concern   Not on file  Social History Narrative   Not on file   Social  Determinants of Health   Financial Resource Strain: Not on file  Food Insecurity: Not on file  Transportation Needs: Not on file  Physical Activity: Not on file  Stress: Not on file  Social Connections: Not on file   Past Surgical History:  Procedure Laterality Date   ARTHROSCOPY KNEE W/ DRILLING     right knee    ENDOBRONCHIAL ULTRASOUND Bilateral 03/04/2017   Procedure: ENDOBRONCHIAL ULTRASOUND;  Surgeon: Leslye Peer, MD;  Location: [facility];  Service: Cardiopulmonary;  Laterality: Bilateral;   left knee inner growth plate removed     to straighten leg   Past Surgical History:  Procedure Laterality Date   ARTHROSCOPY KNEE W/ DRILLING     right knee    ENDOBRONCHIAL ULTRASOUND Bilateral 03/04/2017   Procedure: ENDOBRONCHIAL ULTRASOUND;  Surgeon: Leslye Peer, MD;  Location: [facility];  Service: Cardiopulmonary;  Laterality: Bilateral;   left knee inner growth plate removed     to straighten leg   Past Medical History:  Diagnosis Date   Anxiety    Depression    Dyspnea    Eczema of hand    Headache    hx of with tooth pain   Obese    Pneumonia    12/2016  Pre-diabetes    Sarcoidosis    Sleep apnea    cpap   There were no vitals taken for this visit.  Opioid Risk Score:   Fall Risk Score:  `1  Depression screen [facility] 2/9     11/01/2021   10:25 AM 10/02/2021    9:41 AM 08/30/2021    8:36 AM 08/01/2021    8:30 AM 07/06/2021    8:43 AM 06/07/2021   10:15 AM 05/11/2021    8:17 AM  Depression screen PHQ 2/9  Decreased Interest 0 1 1 1  0 1 0  Down, Depressed, Hopeless 0 1 1 1  0 1 1  PHQ - 2 Score 0 2 2 2  0 2 1    Review of Systems  Musculoskeletal:  Positive for back pain.       Pain in both knees, right hip & thigh pain  All other systems reviewed and are negative.      Objective:   Physical Exam Vitals and nursing note reviewed.  Constitutional:      Appearance: Normal appearance. He is obese.  Cardiovascular:     Rate and Rhythm: Normal  rate and regular rhythm.     Pulses: Normal pulses.     Heart sounds: Normal heart sounds.  Pulmonary:     Effort: Pulmonary effort is normal.     Breath sounds: Normal breath sounds.  Musculoskeletal:     Cervical back: Normal range of motion and neck supple.     Comments: Normal Muscle Bulk and Muscle Testing Reveals:  Upper Extremities: Full ROM and Muscle Strength 5/5 Lumbar Paraspinal Tenderness: L-4-L-5 Lower Extremities: Decreased ROM and Muscle Strength 5/5 Bilateral Lower Extremities Flexion Produces Pain into his Bilateral Patellas Arises from Table Slowly Antalgic  Gait     Skin:    General: Skin is warm and dry.  Neurological:     Mental Status: He is alert and oriented to person, place, and time.  Psychiatric:        Mood and Affect: Mood normal.        Behavior: Behavior normal.         Assessment & Plan:  1.Right Lumbar Radiculitis/ Spinal Stenosis : Continue HEP as Tolerated and Continue to Monitor. 11/29/2021 Refilled: :Oxycodone 10/325 mg one tablet 4 times a day as needed for pain #120. We will continue the opioid monitoring program, this consists of regular clinic visits, examinations, urine drug screen, pill counts as well as use of Controlled Substance Reporting system. A 12 month History has been reviewed on the Controlled Substance Reporting System on 11/01/2021.  2. Thoracic Back Pain: No complaints today.Continue HEP as Tolerated . Continue to Monitor. 11/29/2021 3. Right Shoulder Pain: No Complaints today. Continue HEP as Tolerated and Continue to Monitor.11/29/2021 4. Chronic Pain Syndrome: Continue HEP as Tolerated. Continue to Monitor. 11/29/2021 5. Morbid Obesity: Continue Healthy Diet Regimen. Continue to Monitor. 11/29/2021 6. Chronic Bilateral Knee Pain:Continue HEP as Tolerated. Continue to Monitor. 11/29/2021   F/U in 1 Month

## 2021-12-05 ENCOUNTER — Encounter: Payer: Self-pay | Admitting: Registered Nurse

## 2021-12-10 ENCOUNTER — Encounter: Payer: Self-pay | Admitting: Emergency Medicine

## 2021-12-11 NOTE — Telephone Encounter (Signed)
Pt is want to re-schedule his CT chest. Order was placed in 12/2020. Please send back if new order is needed. Thanks!

## 2021-12-12 NOTE — Progress Notes (Signed)
Office Visit Note  Patient: Anthony [name]             Date of Birth: Sep 10, 1980           MRN: [id number]             PCP: Pcp, No Referring: No ref. provider found Visit Date: 12/25/2021   Subjective:  Follow-up (No complaints today.)   History of Present Illness: Anthony [name] is a 41 y.o. 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 14 days and prednisone 15 mg daily.  He had some delay in getting the Humira so only started this at home more recently reports taking the fourth dose today before clinic.  He had updated CT imaging of the chest about a week ago that showed resolution of his mediastinal lymphadenopathy but did demonstrate some persistent mild scarring changes.  Back and knee pain symptoms have been stable about the same as before reasonably controlled on his pain medication but definitely limits his mobility does not tolerate walking long distance due to back pain.  Previous HPI 09/18/2021 Anthony [name] is a 41 y.o. 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 He is feeling about the same as at our last visit still join pain worst in bilateral knees and skin rash is present on both palms. Labs from last visit were mostly normal although also showed low TPMT function test.   Previous HPI 08/27/2021 Anthony [name] is a 41 y.o. 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 He previously took methotrexate without definite symptom control. Recently increased back to 30 mg prednisone tapering. He has some chronic joint pain in multiple areas including chronic rotator cuff tear and lumbar spine DJD.  He has longstanding disease with chronic joint pain in both knees since he was a teenager. This has been attributed to use related and also found to have significant OA on xrays from a few years ago.  Skin disease is also been chronic  mostly affecting the palms of bilateral hands.  However starting around 2018 he developed shortness of breath symptoms as well as worsening skin inflammation this led to original diagnosis of sarcoidosis.  He has received off-and-on treatments of prednisone which improved his symptoms significantly.  He did take methotrexate however feels like his joints and skin may have been not improved or possibly worse when on the medication definitely do not see an improvement in symptoms.  More recently in the past year he is also developed some hair loss and pigmentation changes on the scalp.  He has hyperpigmented skin patches on distal legs and developed a right leg ulcer this has been healing over the past 3 weeks following up with wound care treatment is also been on the prednisone during this time.  The most recent respiratory symptom exacerbation in the past few months now on prednisone for least about 6 weeks slowly tapering.  He was recommended for updated chest CT scan is had be rescheduled to a different site for machine weight capacity limit he has not yet gotten this rescheduled again. He denies any involvement of skin on soles of the feet like he has on his hands.  He has used hypotensive topical steroids for this in the past but not recently.  He denies any eye inflammation problems or vision changes.   09/10/2017 CT Chest IMPRESSION: 1. Marked decrease  in size of RIGHT perihilar pulmonary parenchymal nodules and LEFT upper lobe nodule with linear platelike scarring remaining. 2. Mild mediastinal adenopathy is similar to slightly decreased.     Review of Systems  Constitutional:  Positive for fatigue.  HENT:  Negative for mouth sores and mouth dryness.   Eyes:  Negative for dryness.  Respiratory:  Positive for shortness of breath.   Cardiovascular:  Negative for chest pain and palpitations.  Gastrointestinal:  Negative for blood in stool, constipation and diarrhea.  Endocrine: Negative for  increased urination.  Genitourinary:  Negative for involuntary urination.  Musculoskeletal:  Positive for joint pain, joint pain, joint swelling, myalgias, muscle weakness, morning stiffness, muscle tenderness and myalgias. Negative for gait problem.  Skin:  Positive for rash. Negative for color change, hair loss and sensitivity to sunlight.  Allergic/Immunologic: Negative for susceptible to infections.  Neurological:  Negative for dizziness and headaches.  Hematological:  Negative for swollen glands.  Psychiatric/Behavioral:  Positive for sleep disturbance. Negative for depressed mood. The patient is not nervous/anxious.     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) 02/24/2017   Abnormal chest x-ray with multiple lung nodules 02/08/2017   Reactive depression 01/16/2017   Anticoagulated by anticoagulation treatment 01/16/2017    Past Medical History:  Diagnosis Date   Anxiety    Depression    Dyspnea    Eczema of hand    Headache    hx of with tooth pain   Obese    Pneumonia    12/2016   Pre-diabetes    Sarcoidosis    Sleep apnea    cpap    Family History  Problem Relation Age of Onset   Hypertension Father    Diabetes Father    Stroke Father    Congestive Heart Failure Father    Lymphoma Paternal Uncle    Ovarian cancer Maternal Grandmother 20   Past Surgical History:  Procedure Laterality Date   ARTHROSCOPY KNEE W/ DRILLING     right knee    ENDOBRONCHIAL ULTRASOUND Bilateral 03/04/2017   Procedure: ENDOBRONCHIAL ULTRASOUND;  Surgeon:  Leslye Peer, MD;  Location: [facility];  Service: Cardiopulmonary;  Laterality: Bilateral;   left knee inner growth plate removed     to straighten leg   Social History   Social History Narrative   Not on file    There is no immunization history on file for this patient.   Objective: Vital Signs: BP 100/65 (BP Location: Left Arm, Patient Position: Sitting, Cuff Size: Large)   Pulse 82   Resp 14   Ht 5\' 8"  (1.727 m)   Wt (!) 573 lb 12.8 oz (260.3 kg)   BMI 87.25 kg/m    Physical Exam Constitutional:      Appearance: He is obese.  Cardiovascular:     Rate and Rhythm: Normal rate and regular rhythm.  Pulmonary:     Effort: Pulmonary effort is normal.     Breath sounds: Normal breath sounds.  Musculoskeletal:     Comments: Trace pedal edema bilaterally  Skin:  General: Skin is warm and dry.     Findings: Rash present.     Comments: Hyperpigmented papulosquamous or scaling type rash in pattern on palms of both hands worse on the right side and on anterior right shin  Neurological:     Mental Status: He is alert.  Psychiatric:        Mood and Affect: Mood normal.          Musculoskeletal Exam:  Shoulders full ROM no tenderness or swelling Elbows full ROM no tenderness or swelling Wrists full ROM no tenderness or swelling Fingers full ROM no tenderness or swelling Knees full ROM no tenderness or swelling Ankles full ROM no tenderness or swelling   Investigation: No additional findings.  Imaging: CT Chest Wo Contrast  Result Date: 12/19/2021 CLINICAL DATA:  Sarcoidosis. EXAM: CT CHEST WITHOUT CONTRAST TECHNIQUE: Multidetector CT imaging of the chest was performed following the standard protocol without IV contrast. RADIATION DOSE REDUCTION: This exam was performed according to the departmental dose-optimization program which includes automated exposure control, adjustment of the mA and/or kV according to patient size and/or use of iterative reconstruction  technique. COMPARISON:  Chest CT 09/10/2017 FINDINGS: Cardiovascular: The heart is upper normal in size. The thoracic aorta is normal in caliber. No pericardial effusion. Mediastinum/Nodes: Improved paratracheal lymph node from prior exam, currently 10 mm, previously 12 mm. There are no enlarged mediastinal lymph nodes by size criteria. Limited assessment for hilar adenopathy in the absence of IV contrast. No obvious supraclavicular or axillary adenopathy, detailed assessment limited by soft tissue attenuation from habitus. Decompressed esophagus. Lungs/Pleura: Bandlike scarring in the left upper lobe is more linear than on prior exam, series 8, image 50. diminishing bandlike scarring in the right perihilar lung, now appearing linear. No new parenchymal nodules. There is no pleural fluid. Upper Abdomen: Hepatic steatosis. Musculoskeletal: Thoracic spondylosis. There are no acute or suspicious osseous abnormalities. IMPRESSION: 1. Improved mediastinal adenopathy from prior exam. 2. Bandlike scarring in the left upper lobe and right perihilar lung, more linear than on prior exam. No pulmonary nodules. 3. Hepatic steatosis. Electronically Signed   By: Narda Rutherford M.D.   On: 12/19/2021 23:49    Recent Labs: Lab Results  Component Value Date   WBC 9.5 08/27/2021   HGB 16.0 08/27/2021   PLT 290 08/27/2021   NA 140 08/27/2021   K 4.2 08/27/2021   CL 103 08/27/2021   CO2 28 08/27/2021   GLUCOSE 97 08/27/2021   BUN 11 08/27/2021   CREATININE 1.02 08/27/2021   BILITOT 0.6 08/27/2021   ALKPHOS 66 05/10/2021   AST 18 08/27/2021   ALT 33 08/27/2021   PROT 7.7 08/27/2021   ALBUMIN 4.1 05/10/2021   CALCIUM 9.4 08/27/2021   GFRAA >60 01/02/2017   QFTBGOLDPLUS NEGATIVE 08/27/2021    Speciality Comments: No specialty comments available.  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  Not sure how much response due to continued prednisone versus Humira was pretty recently started.  Plan continue current medications Humira 40 mg subcu q. 14 days and prednisone 15 mg daily.  Will defer scheduling of prednisone taper to pulmonary clinic follow-up at this time but seems okay to start with skin disease improving.  High risk medication use - Humira Inject 40 mg into  the skin every 14 (fourteen) days. - Plan: CBC with Differential/Platelet, COMPLETE METABOLIC PANEL WITH GFR  Checking CBC and CMP for Humira medication monitoring.  No interval new infections or intolerance with injections.  Chronic pain of both knees Osteoarthritis of both knees, unspecified osteoarthritis type  Bilateral knee pain looks most consistent with his osteoarthritis which is being worse due to his low mobility and weight.  Ongoing follow-up with pain management which is controlling low back symptoms reasonably but does have limited mobility.  Mediastinal lymphadenopathy  Abnormal chest x-ray with multiple lung nodules    Orders: Orders Placed This Encounter  Procedures   CBC with Differential/Platelet   COMPLETE METABOLIC PANEL WITH GFR   No orders of the defined types were placed in this encounter.    Follow-Up Instructions: Return in about 3 months (around 03/27/2022) for Sarcoidosis on ADA/GC f/u 35mos.   Fuller Plan, MD  Note - This record has been created using [employer].  Chart creation errors have been sought, but may not always  have been located. Such creation errors do not reflect on  the standard of medical care.

## 2021-12-13 NOTE — Telephone Encounter (Signed)
Order was still good and PA is good thru 8/19.  I rescheduled CT at [facility] for 8/19 at 11:30, pt will need to arrive at 11:00 thru Entrance C on [facility] due to construction at main entrance for imaging.  I left pt vm that CT has been rescheduled and will route to triage to make pt aware thru MyChart.

## 2021-12-19 ENCOUNTER — Ambulatory Visit (HOSPITAL_COMMUNITY)
Admission: RE | Admit: 2021-12-19 | Discharge: 2021-12-19 | Disposition: A | Discharge status: Home / Self Care | Payer: Commercial Managed Care - HMO | Source: Ambulatory Visit | Attending: Emergency Medicine | Admitting: Emergency Medicine

## 2021-12-19 ENCOUNTER — Encounter (INDEPENDENT_AMBULATORY_CARE_PROVIDER_SITE_OTHER): Payer: Self-pay

## 2021-12-19 DIAGNOSIS — D862 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: Secondary | ICD-10-CM | POA: Diagnosis present

## 2021-12-25 ENCOUNTER — Encounter: Payer: Self-pay | Admitting: Internal Medicine

## 2021-12-25 ENCOUNTER — Ambulatory Visit: Payer: Commercial Managed Care - HMO | Attending: Internal Medicine | Admitting: Internal Medicine

## 2021-12-25 VITALS — BP 100/65 | HR 82 | Resp 14 | Ht 68.0 in | Wt >= 6400 oz

## 2021-12-25 DIAGNOSIS — G8929 Other chronic pain: Secondary | ICD-10-CM

## 2021-12-25 DIAGNOSIS — D862 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: Secondary | ICD-10-CM | POA: Diagnosis not present

## 2021-12-25 DIAGNOSIS — Z79899 Other long term (current) drug therapy: Secondary | ICD-10-CM

## 2021-12-25 DIAGNOSIS — M170 Bilateral primary osteoarthritis of knee: Secondary | ICD-10-CM

## 2021-12-25 DIAGNOSIS — M25562 Pain in left knee: Secondary | ICD-10-CM

## 2021-12-25 DIAGNOSIS — R918 Other nonspecific abnormal finding of lung field: Secondary | ICD-10-CM

## 2021-12-25 DIAGNOSIS — M25561 Pain in right knee: Secondary | ICD-10-CM | POA: Diagnosis not present

## 2021-12-25 DIAGNOSIS — R590 Localized enlarged lymph nodes: Secondary | ICD-10-CM

## 2021-12-26 LAB — COMPLETE METABOLIC PANEL WITH GFR
AG Ratio: 1.5 (calc) (ref 1.0–2.5)
ALT: 25 U/L (ref 9–46)
AST: 18 U/L (ref 10–40)
Albumin: 4.3 g/dL (ref 3.6–5.1)
Alkaline phosphatase (APISO): 62 U/L (ref 36–130)
BUN: 9 mg/dL (ref 7–25)
CO2: 25 mmol/L (ref 20–32)
Calcium: 8.9 mg/dL (ref 8.6–10.3)
Chloride: 105 mmol/L (ref 98–110)
Creat: 0.95 mg/dL (ref 0.60–1.29)
Globulin: 2.9 g/dL (calc) (ref 1.9–3.7)
Glucose, Bld: 97 mg/dL (ref 65–99)
Potassium: 3.8 mmol/L (ref 3.5–5.3)
Sodium: 141 mmol/L (ref 135–146)
Total Bilirubin: 0.3 mg/dL (ref 0.2–1.2)
Total Protein: 7.2 g/dL (ref 6.1–8.1)
eGFR: 104 mL/min/{1.73_m2} (ref >=60)

## 2021-12-26 LAB — CBC WITH DIFFERENTIAL/PLATELET
Absolute Monocytes: 432 cells/uL (ref 200–950)
Basophils Absolute: 40 cells/uL (ref 0–200)
Basophils Relative: 0.5 %
Eosinophils Absolute: 144 cells/uL (ref 15–500)
Eosinophils Relative: 1.8 %
HCT: 44.9 % (ref 38.5–50.0)
Hemoglobin: 15.2 g/dL (ref 13.2–17.1)
Lymphs Abs: 1960 cells/uL (ref 850–3900)
MCH: 31.9 pg (ref 27.0–33.0)
MCHC: 33.9 g/dL (ref 32.0–36.0)
MCV: 94.3 fL (ref 80.0–100.0)
MPV: 10.6 fL (ref 7.5–12.5)
Monocytes Relative: 5.4 %
Neutro Abs: 5424 cells/uL (ref 1500–7800)
Neutrophils Relative %: 67.8 %
Platelets: 280 10*3/uL (ref 140–400)
RBC: 4.76 10*6/uL (ref 4.20–5.80)
RDW: 13.6 % (ref 11.0–15.0)
Total Lymphocyte: 24.5 %
WBC: 8 10*3/uL (ref 3.8–10.8)

## 2021-12-26 NOTE — Progress Notes (Signed)
His blood count and metabolic panel labs are normal, no problem to continue the Humira.

## 2021-12-27 ENCOUNTER — Ambulatory Visit: Payer: Commercial Managed Care - HMO | Admitting: Registered Nurse

## 2021-12-31 ENCOUNTER — Encounter: Payer: Commercial Managed Care - HMO | Attending: Physical Medicine and Rehabilitation | Admitting: Registered Nurse

## 2021-12-31 ENCOUNTER — Encounter: Payer: Self-pay | Admitting: Registered Nurse

## 2021-12-31 VITALS — BP 140/80 | HR 65 | Ht 68.0 in | Wt >= 6400 oz

## 2021-12-31 DIAGNOSIS — G8929 Other chronic pain: Secondary | ICD-10-CM | POA: Diagnosis present

## 2021-12-31 DIAGNOSIS — M48062 Spinal stenosis, lumbar region with neurogenic claudication: Secondary | ICD-10-CM

## 2021-12-31 DIAGNOSIS — Z5181 Encounter for therapeutic drug level monitoring: Secondary | ICD-10-CM | POA: Diagnosis present

## 2021-12-31 DIAGNOSIS — Z79891 Long term (current) use of opiate analgesic: Secondary | ICD-10-CM

## 2021-12-31 DIAGNOSIS — G894 Chronic pain syndrome: Secondary | ICD-10-CM

## 2021-12-31 DIAGNOSIS — M25561 Pain in right knee: Secondary | ICD-10-CM

## 2021-12-31 DIAGNOSIS — M25562 Pain in left knee: Secondary | ICD-10-CM | POA: Diagnosis present

## 2021-12-31 MED ORDER — OXYCODONE-ACETAMINOPHEN 10-325 MG PO TABS: 1.0000 | ORAL_TABLET | Freq: Four times a day (QID) | ORAL | 0 refills | Status: DC | PRN

## 2021-12-31 NOTE — Progress Notes (Signed)
Subjective:    Patient ID: Anthony [name], male    DOB: 1981-04-11, 41 y.o.   MRN: [id number]  HPI: Anthony [name] is a 41 y.o. male who returns for follow up appointment for chronic pain and medication refill. He states his pain is located in his lower back and bilateral knee pain. He rates his pain 6. His current exercise regime is walking and performing stretching exercises.  Anthony [name] Morphine equivalent is 60.00 MME.   UDS ordered today.    Pain Inventory Average Pain 6 Pain Right Now 6 My pain is sharp, burning, dull, stabbing, and aching  In the last 24 hours, has pain interfered with the following? General activity 10 Relation with others 10 Enjoyment of life 10 What TIME of day is your pain at its worst? morning , daytime, evening, and night Sleep (in general) Poor  Pain is worse with: walking, bending, sitting, inactivity, and standing Pain improves with: rest, heat/ice, and medication Relief from Meds: 5  Family History  Problem Relation Age of Onset   Hypertension Father    Diabetes Father    Stroke Father    Congestive Heart Failure Father    Lymphoma Paternal Uncle    Ovarian cancer Maternal Grandmother 28   Social History   Socioeconomic History   Marital status: Married    Spouse name: Not on file   Number of children: Not on file   Years of education: Not on file   Highest education level: Not on file  Occupational History   Occupation: Disabled  Tobacco Use   Smoking status: Former    Packs/day: 0.30    Years: 10.00    Total pack years: 3.00    Types: Cigarettes    Quit date: 09/30/2016    Years since quitting: 5.2    Passive exposure: Never   Smokeless tobacco: Never  Vaping Use   Vaping Use: Never used  Substance and Sexual Activity   Alcohol use: No   Drug use: No   Sexual activity: Yes    Birth control/protection: Condom  Other Topics Concern   Not on file  Social History Narrative   Not on file   Social Determinants of  Health   Financial Resource Strain: Not on file  Food Insecurity: Not on file  Transportation Needs: Not on file  Physical Activity: Not on file  Stress: Not on file  Social Connections: Not on file   Past Surgical History:  Procedure Laterality Date   ARTHROSCOPY KNEE W/ DRILLING     right knee    ENDOBRONCHIAL ULTRASOUND Bilateral 03/04/2017   Procedure: ENDOBRONCHIAL ULTRASOUND;  Surgeon: Leslye Peer, MD;  Location: [facility];  Service: Cardiopulmonary;  Laterality: Bilateral;   left knee inner growth plate removed     to straighten leg   Past Surgical History:  Procedure Laterality Date   ARTHROSCOPY KNEE W/ DRILLING     right knee    ENDOBRONCHIAL ULTRASOUND Bilateral 03/04/2017   Procedure: ENDOBRONCHIAL ULTRASOUND;  Surgeon: Leslye Peer, MD;  Location: [facility];  Service: Cardiopulmonary;  Laterality: Bilateral;   left knee inner growth plate removed     to straighten leg   Past Medical History:  Diagnosis Date   Anxiety    Depression    Dyspnea    Eczema of hand    Headache    hx of with tooth pain   Obese    Pneumonia    12/2016   Pre-diabetes  Sarcoidosis    Sleep apnea    cpap   BP (!) 140/80   Pulse 65   Ht 5\' 8"  (1.727 m)   Wt (!) 569 lb 6.4 oz (258.3 kg)   SpO2 97%   BMI 86.58 kg/m   Opioid Risk Score:   Fall Risk Score:  `1  Depression screen [facility] 2/9     12/31/2021    9:02 AM 11/29/2021    8:57 AM 11/01/2021   10:25 AM 10/02/2021    9:41 AM 08/30/2021    8:36 AM 08/01/2021    8:30 AM 07/06/2021    8:43 AM  Depression screen PHQ 2/9  Decreased Interest 0 0 0 1 1 1  0  Down, Depressed, Hopeless 0 0 0 1 1 1  0  PHQ - 2 Score 0 0 0 2 2 2  0      Review of Systems  Musculoskeletal:  Positive for back pain.       Bilateral Knee pain Right leg pain   All other systems reviewed and are negative.     Objective:   Physical Exam Vitals and nursing note reviewed.  Constitutional:      Appearance: Normal appearance. He is  obese.  Cardiovascular:     Rate and Rhythm: Normal rate and regular rhythm.     Pulses: Normal pulses.     Heart sounds: Normal heart sounds.  Pulmonary:     Effort: Pulmonary effort is normal.     Breath sounds: Normal breath sounds.  Musculoskeletal:     Cervical back: Normal range of motion and neck supple.     Comments: Normal Muscle Bulk and Muscle Testing Reveals:  Upper Extremities: Full ROM and Muscle Strength 5/5  Lumbar Paraspinal Tenderness: L-3-L-5 Lower Extremities: Full ROM and Muscle Strength 5/5 Arises from Table slowly Narrow Based  Gait     Skin:    General: Skin is warm and dry.  Neurological:     Mental Status: He is alert and oriented to person, place, and time.  Psychiatric:        Mood and Affect: Mood normal.        Behavior: Behavior normal.         Assessment & Plan:  1.Right Lumbar Radiculitis/ Spinal Stenosis : Continue HEP as Tolerated and Continue to Monitor. 12/31/2021 Refilled: :Oxycodone 10/325 mg one tablet 4 times a day as needed for pain #120. We will continue the opioid monitoring program, this consists of regular clinic visits, examinations, urine drug screen, pill counts as well as use of Controlled Substance Reporting system. A 12 month History has been reviewed on the Controlled Substance Reporting System on 12/31/2021.  2. Thoracic Back Pain: No complaints today.Continue HEP as Tolerated . Continue to Monitor. 12/31/2021 3. Right Shoulder Pain: No Complaints today. Continue HEP as Tolerated and Continue to Monitor.[id number] 4. Chronic Pain Syndrome: Continue HEP as Tolerated. Continue to Monitor. 12/31/2021 5. Morbid Obesity: Continue Healthy Diet Regimen. Continue to Monitor. 12/31/2021 6. Chronic Bilateral Knee Pain:Continue HEP as Tolerated. Continue to Monitor. 12/31/2021   F/U in 1 Month

## 2022-01-02 ENCOUNTER — Encounter: Payer: Self-pay | Admitting: Cardiovascular Disease

## 2022-01-02 ENCOUNTER — Telehealth: Payer: Self-pay | Admitting: Cardiovascular Disease

## 2022-01-02 NOTE — Telephone Encounter (Signed)
Pt's is calling wanting to schedule with Dr. Eden Emms stating that he has friends that see Dr. Eden Emms and he does not want to see anyone else. I did inform patient that he would be considered a Dr. Flora Lipps patient, but patient states that he only saw him once in 2021 and does not consider Dr. Flora Lipps his cardiologist at this time. Patient was insistent to schedule with Dr. Eden Emms even though I informed patient that Dr. Eden Emms does not accept provider switch.   Requesting provider switch from Dr. Flora Lipps to Dr. Eden Emms.

## 2022-01-03 ENCOUNTER — Other Ambulatory Visit: Payer: Self-pay | Admitting: Pulmonary Disease

## 2022-01-03 LAB — TOXASSURE SELECT,+ANTIDEPR,UR

## 2022-01-03 NOTE — Telephone Encounter (Signed)
Error

## 2022-01-04 ENCOUNTER — Other Ambulatory Visit: Payer: Self-pay | Admitting: Emergency Medicine

## 2022-01-04 ENCOUNTER — Telehealth: Payer: Self-pay | Admitting: *Deleted

## 2022-01-04 MED ORDER — PREDNISONE 10 MG PO TABS: 15.0000 mg | ORAL_TABLET | Freq: Every day | ORAL | 0 refills | Status: DC

## 2022-01-04 NOTE — Telephone Encounter (Signed)
Urine drug screen for this encounter is consistent for prescribed medication 

## 2022-01-29 ENCOUNTER — Encounter: Payer: Self-pay | Admitting: Registered Nurse

## 2022-01-29 ENCOUNTER — Encounter: Payer: Commercial Managed Care - HMO | Attending: Physical Medicine and Rehabilitation | Admitting: Registered Nurse

## 2022-01-29 VITALS — BP 138/84 | HR 79 | Ht 68.0 in | Wt >= 6400 oz

## 2022-01-29 DIAGNOSIS — M25561 Pain in right knee: Secondary | ICD-10-CM | POA: Insufficient documentation

## 2022-01-29 DIAGNOSIS — Z5181 Encounter for therapeutic drug level monitoring: Secondary | ICD-10-CM | POA: Diagnosis present

## 2022-01-29 DIAGNOSIS — G894 Chronic pain syndrome: Secondary | ICD-10-CM | POA: Diagnosis present

## 2022-01-29 DIAGNOSIS — Z79891 Long term (current) use of opiate analgesic: Secondary | ICD-10-CM | POA: Diagnosis present

## 2022-01-29 DIAGNOSIS — M25562 Pain in left knee: Secondary | ICD-10-CM | POA: Insufficient documentation

## 2022-01-29 DIAGNOSIS — M48062 Spinal stenosis, lumbar region with neurogenic claudication: Secondary | ICD-10-CM | POA: Diagnosis present

## 2022-01-29 DIAGNOSIS — G8929 Other chronic pain: Secondary | ICD-10-CM | POA: Insufficient documentation

## 2022-01-29 MED ORDER — OXYCODONE-ACETAMINOPHEN 10-325 MG PO TABS: 1.0000 | ORAL_TABLET | Freq: Four times a day (QID) | ORAL | 0 refills | Status: DC | PRN

## 2022-01-29 NOTE — Progress Notes (Signed)
Subjective:    Patient ID: Anthony [name], male    DOB: 06-21-1980, 41 y.o.   MRN: [id number]  HPI: Anthony [name] is a 41 y.o. male who returns for follow up appointment for chronic pain and medication refill. He states his pain is located in his lower back and bilateral knee pain. He rates his pain 7. His current exercise regime is walking and performing stretching exercises.  Ms. Cease Morphine equivalent is 60.00 MME.   Last UDS was Performed on 12/31/2021, it was consistent.     Pain Inventory Average Pain 7 Pain Right Now 7 My pain is sharp, burning, stabbing, and aching  In the last 24 hours, has pain interfered with the following? General activity 10 Relation with others 10 Enjoyment of life 10 What TIME of day is your pain at its worst? morning , daytime, evening, and night Sleep (in general) Poor  Pain is worse with: walking, bending, sitting, and standing Pain improves with: medication Relief from Meds: 6  Family History  Problem Relation Age of Onset   Hypertension Father    Diabetes Father    Stroke Father    Congestive Heart Failure Father    Lymphoma Paternal Uncle    Ovarian cancer Maternal Grandmother 23   Social History   Socioeconomic History   Marital status: Married    Spouse name: Not on file   Number of children: Not on file   Years of education: Not on file   Highest education level: Not on file  Occupational History   Occupation: Disabled  Tobacco Use   Smoking status: Former    Packs/day: 0.30    Years: 10.00    Total pack years: 3.00    Types: Cigarettes    Quit date: 09/30/2016    Years since quitting: 5.3    Passive exposure: Never   Smokeless tobacco: Never  Vaping Use   Vaping Use: Never used  Substance and Sexual Activity   Alcohol use: No   Drug use: No   Sexual activity: Yes    Birth control/protection: Condom  Other Topics Concern   Not on file  Social History Narrative   Not on file   Social Determinants of  Health   Financial Resource Strain: Not on file  Food Insecurity: Not on file  Transportation Needs: Not on file  Physical Activity: Not on file  Stress: Not on file  Social Connections: Not on file   Past Surgical History:  Procedure Laterality Date   ARTHROSCOPY KNEE W/ DRILLING     right knee    ENDOBRONCHIAL ULTRASOUND Bilateral 03/04/2017   Procedure: ENDOBRONCHIAL ULTRASOUND;  Surgeon: Collene Gobble, MD;  Location: [facility];  Service: Cardiopulmonary;  Laterality: Bilateral;   left knee inner growth plate removed     to straighten leg   Past Surgical History:  Procedure Laterality Date   ARTHROSCOPY KNEE W/ DRILLING     right knee    ENDOBRONCHIAL ULTRASOUND Bilateral 03/04/2017   Procedure: ENDOBRONCHIAL ULTRASOUND;  Surgeon: Collene Gobble, MD;  Location: [facility];  Service: Cardiopulmonary;  Laterality: Bilateral;   left knee inner growth plate removed     to straighten leg   Past Medical History:  Diagnosis Date   Anxiety    Depression    Dyspnea    Eczema of hand    Headache    hx of with tooth pain   Obese    Pneumonia    12/2016   Pre-diabetes  Sarcoidosis    Sleep apnea    cpap   BP 138/84   Pulse 79   Ht  (1.727 m)   Wt (!) 578 lb 6.4 oz (262.4 kg)   SpO2 97%   BMI 87.95 kg/m   Opioid Risk Score:   Fall Risk Score:  `1  Depression screen [facility] 2/9     01/29/2022    8:38 AM 12/31/2021    9:02 AM 11/29/2021    8:57 AM 11/01/2021   10:25 AM 10/02/2021    9:41 AM 08/30/2021    8:36 AM 08/01/2021    8:30 AM  Depression screen PHQ 2/9  Decreased Interest 0 0 0 0 Down, Depressed, Hopeless 0 0 0 0 PHQ - 2 Score 0 0 0 0 Review of Systems  Constitutional: Negative.   HENT: Negative.    Eyes: Negative.   Respiratory: Negative.    Cardiovascular: Negative.   Gastrointestinal: Negative.   Endocrine: Negative.   Genitourinary: Negative.   Musculoskeletal:  Positive for back pain.  Skin: Negative.    Allergic/Immunologic: Negative.   Neurological: Negative.   Hematological: Negative.   Psychiatric/Behavioral:  Positive for sleep disturbance.       Objective:   Physical Exam Vitals and nursing note reviewed.  Constitutional:      Appearance: Normal appearance. He is obese.  Cardiovascular:     Rate and Rhythm: Normal rate and regular rhythm.     Pulses: Normal pulses.     Heart sounds: Normal heart sounds.  Pulmonary:     Effort: Pulmonary effort is normal.     Breath sounds: Normal breath sounds.  Musculoskeletal:     Cervical back: Normal range of motion and neck supple.     Comments: Normal Muscle Bulk and Muscle Testing Reveals:  Upper Extremities: Full ROM and Muscle Strength 5/5 Lower Extremities: Decreased ROM and Muscle Strength 5/5 Bilateral Lower Extremities Flexion Produces Pain into his bilateral Patella's Arises from Table slowly Antalgic  Gait     Skin:    General: Skin is warm and dry.  Neurological:     Mental Status: He is alert and oriented to person, place, and time.  Psychiatric:        Mood and Affect: Mood normal.        Behavior: Behavior normal.         Assessment & Plan:  1.Right Lumbar Radiculitis/ Spinal Stenosis : Continue HEP as Tolerated and Continue to Monitor. 02/04/2022 Refilled: :Oxycodone 10/325 mg one tablet 4 times a day as needed for pain #120. We will continue the opioid monitoring program, this consists of regular clinic visits, examinations, urine drug screen, pill counts as well as use of [place] Controlled Substance Reporting system. A 12 month History has been reviewed on the [place] Controlled Substance Reporting System on 02/04/2022.  2. Thoracic Back Pain: No complaints today.Continue HEP as Tolerated . Continue to Monitor. 02/04/2022 3. Right Shoulder Pain: No Complaints today. Continue HEP as Tolerated and Continue to Monitor.09/252023 4. Chronic Pain Syndrome: Continue HEP as Tolerated. Continue to  Monitor. 02/04/2022 5. Morbid Obesity: Continue Healthy Diet Regimen. Continue to Monitor. 02/04/2022 6. Chronic Bilateral Knee Pain:Continue HEP as Tolerated. Continue to Monitor. 02/04/2022   F/U in 1 Month

## 2022-02-11 ENCOUNTER — Other Ambulatory Visit: Payer: Self-pay | Admitting: Internal Medicine

## 2022-02-11 DIAGNOSIS — D862 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: Secondary | ICD-10-CM

## 2022-02-11 DIAGNOSIS — Z79899 Other long term (current) drug therapy: Secondary | ICD-10-CM

## 2022-02-11 NOTE — Telephone Encounter (Signed)
Next Visit: 03/29/2022  Last Visit: 12/25/2021  Last Fill: 10/09/2021   YQ:MGNOIBBCWUG of lung with sarcoidosis of lymph nodes   Current Dose per office note 12/25/2021: Humira Inject 40 mg into the skin every 14 (fourteen) days.  Labs: 12/25/2021 His blood count and metabolic panel labs are normal, no problem to continue the Humira.  TB Gold: 08/27/2021   NEGATIVE   Okay to refill Humira?

## 2022-02-13 ENCOUNTER — Other Ambulatory Visit: Payer: Self-pay | Admitting: Physical Medicine and Rehabilitation

## 2022-02-19 ENCOUNTER — Encounter: Payer: Self-pay | Admitting: Emergency Medicine

## 2022-02-19 ENCOUNTER — Ambulatory Visit (INDEPENDENT_AMBULATORY_CARE_PROVIDER_SITE_OTHER): Payer: Commercial Managed Care - HMO | Admitting: Emergency Medicine

## 2022-02-19 DIAGNOSIS — G4733 Obstructive sleep apnea (adult) (pediatric): Secondary | ICD-10-CM | POA: Diagnosis not present

## 2022-02-19 DIAGNOSIS — D862 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: Secondary | ICD-10-CM | POA: Diagnosis not present

## 2022-02-19 MED ORDER — PREDNISONE 10 MG PO TABS: 10.0000 mg | ORAL_TABLET | Freq: Every day | ORAL | 5 refills | Status: DC

## 2022-02-19 MED ORDER — ALBUTEROL SULFATE HFA 108 (90 BASE) MCG/ACT IN AERS: 2.0000 | INHALATION_SPRAY | Freq: Four times a day (QID) | RESPIRATORY_TRACT | 3 refills | Status: DC | PRN

## 2022-02-19 NOTE — Progress Notes (Signed)
Subjective:    Patient ID: Anthony [name], male    DOB: 04/25/1981, 41 y.o.   MRN: [id number]  HPI  ROV 06/28/21 --Anthony [name] is 32 with a history of pulmonary and cutaneous sarcoidosis, severe obesity with OSA/OHS for which he uses BiPAP.  He saw Dr. Erin Fulling 12/29 with progression of his cutaneous symptoms in his bilateral palms, increasing dyspnea, increasing joint pain.  He was started back on prednisone 30 mg daily.  CT scan of the chest has been ordered but not yet done.  Today he reports that the prednisone has helped his breathing and his skin changes have both improved on the pred. He needs an albuterol inhaler.  Good compliance with his BiPAP.   ROV 02/19/2022 --[name] is 59 with a history of obesity, pulmonary and cutaneous sarcoidosis.  He has severe OSA/OHS and is on BiPAP 27/22 (no oxygen bled in).  He has been managed in the past on steroid sparing agents but principally has been prednisone dependent for almost a year.  Currently on 15 mg daily.  Reports good compliance with his BiPAP.  He does have exertional SOB, stable. Has albuterol that he never uses. Wt is back up for the last year on the prednisone.   CT chest 12/19/2021 reviewed by me, shows improved peritracheal lymphadenopathy, no enlarged mediastinal nodes, some bandlike left upper lobe atelectatic scar.  No new nodules or concerning findings.  Review of Systems As per hpi     Objective:   Physical Exam Vitals:   02/19/22 0846  BP: 136/80  Pulse: (!) 104  Temp: 98 F (36.7 C)  TempSrc: Oral  SpO2: 95%  Weight: (!) 578 lb 12.8 oz (262.5 kg)  Height: 5\' 8"  (1.727 m)   Gen: Pleasant, morbidly obese man, in no distress,  normal affect  ENT: No lesions,  mouth clear,  oropharynx clear, no postnasal drip  Neck: No JVD, no stridor  Lungs: No use of accessory muscles, no crackles or wheezing on normal respiration, no wheeze on forced expiration  Cardiovascular: RRR, heart sounds normal, no murmur or gallops, no  peripheral edema  Musculoskeletal: No deformities, no cyanosis or clubbing  Neuro: alert, awake, non focal  Skin: Small amount of residual scaling on the palms of both hands, significantly improved, no cracked skin, no erythema, no nodules      Assessment & Plan:  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  No evidence of active pulmonary parenchymal sarcoid.  He is clinically stable, dyspnea that relates principally to his OHS.  Rare albuterol use.  No flares.  Clinically stable on prednisone 15 mg.  We have talked about trying to wean to the lowest effective dose and he is willing to try going to 10 mg daily.  We reviewed your CT scan of the chest today.  This is stable to improved compared with your priors.  Good news. We will decrease your prednisone to 10 mg daily.  Please call our office and let us know if you develop any respiratory symptoms, any new skin symptoms.  If so then we may decide to change the dose. Keep your albuterol available to use 2 puffs if needed for shortness of breath, chest tightness, wheezing. You would benefit from getting the flu shot and COVID-19 shot this fall. Follow with Dr Lamonte Sakai in 6 months or sooner if you have any problems  Obstructive sleep apnea Severe with severe OHS.  He tolerates BiPAP, gets good results.  Equipment is up-to-date.  Plan to continue.   Levy Pupa, MD, PhD 02/19/2022, 9:08 AM [facility] [phone] or if no answer [phone]

## 2022-02-19 NOTE — Assessment & Plan Note (Signed)
Severe with severe OHS.  He tolerates BiPAP, gets good results.  Equipment is up-to-date.  Plan to continue.

## 2022-02-19 NOTE — Addendum Note (Signed)
Addended by: Gavin Potters R on: 02/19/2022 09:34 AM   Modules accepted: Orders

## 2022-02-19 NOTE — Assessment & Plan Note (Signed)
Stable mild lymphadenopathy on his CT chest with some atelectatic change.  No evidence of active pulmonary parenchymal sarcoid.  He is clinically stable, dyspnea that relates principally to his OHS.  Rare albuterol use.  No flares.  Clinically stable on prednisone 15 mg.  We have talked about trying to wean to the lowest effective dose and he is willing to try going to 10 mg daily.  We reviewed your CT scan of the chest today.  This is stable to improved compared with your priors.  Good news. We will decrease your prednisone to 10 mg daily.  Please call our office and let us know if you develop any respiratory symptoms, any new skin symptoms.  If so then we may decide to change the dose. Keep your albuterol available to use 2 puffs if needed for shortness of breath, chest tightness, wheezing. You would benefit from getting the flu shot and COVID-19 shot this fall. Follow with Dr Lamonte Sakai in 6 months or sooner if you have any problems

## 2022-02-19 NOTE — Patient Instructions (Signed)
We reviewed your CT scan of the chest today.  This is stable to improved compared with your priors.  Good news. We will decrease your prednisone to 10 mg daily.  Please call our office and let us know if you develop any respiratory symptoms, any new skin symptoms.  If so then we may decide to change the dose. Keep your albuterol available to use 2 puffs if needed for shortness of breath, chest tightness, wheezing. Continue BiPAP every night.  Please let us know if you have any issues with the device or equipment You would benefit from getting the flu shot and COVID-19 shot this fall. Follow with Dr Lamonte Sakai in 6 months or sooner if you have any problems

## 2022-02-25 ENCOUNTER — Encounter: Payer: Commercial Managed Care - HMO | Attending: Physical Medicine and Rehabilitation | Admitting: Registered Nurse

## 2022-02-25 ENCOUNTER — Encounter: Payer: Self-pay | Admitting: Registered Nurse

## 2022-02-25 VITALS — BP 115/77 | HR 76 | Ht 68.0 in | Wt >= 6400 oz

## 2022-02-25 DIAGNOSIS — G8929 Other chronic pain: Secondary | ICD-10-CM | POA: Diagnosis present

## 2022-02-25 DIAGNOSIS — G894 Chronic pain syndrome: Secondary | ICD-10-CM | POA: Insufficient documentation

## 2022-02-25 DIAGNOSIS — M48062 Spinal stenosis, lumbar region with neurogenic claudication: Secondary | ICD-10-CM | POA: Insufficient documentation

## 2022-02-25 DIAGNOSIS — M25562 Pain in left knee: Secondary | ICD-10-CM | POA: Diagnosis present

## 2022-02-25 DIAGNOSIS — Z5181 Encounter for therapeutic drug level monitoring: Secondary | ICD-10-CM | POA: Insufficient documentation

## 2022-02-25 DIAGNOSIS — M25561 Pain in right knee: Secondary | ICD-10-CM | POA: Diagnosis present

## 2022-02-25 DIAGNOSIS — Z79891 Long term (current) use of opiate analgesic: Secondary | ICD-10-CM | POA: Insufficient documentation

## 2022-02-25 MED ORDER — OXYCODONE-ACETAMINOPHEN 10-325 MG PO TABS
1.0000 | ORAL_TABLET | Freq: Four times a day (QID) | ORAL | 0 refills | Status: DC | PRN
Start: 2022-02-25 — End: 2022-03-27

## 2022-02-25 NOTE — Progress Notes (Signed)
Subjective:    Patient ID: Anthony [name], male    DOB: 02-01-1981, 41 y.o.   MRN: [id number]  HPI: Anthony [name] is a 41 y.o. male who returns for follow up appointment for chronic pain and medication refill. He states his pain is located in his lower back and bilateral knee pain. He rates his pain 7. His current exercise regime is walking and performing stretching exercises.  Anthony [name] Morphine equivalent is 60.00 MME.   Last UDS was Performed on 12/31/2021, it was consistent.    Pain Inventory Average Pain 6 Pain Right Now 7 My pain is constant, sharp, burning, dull, stabbing, and aching  In the last 24 hours, has pain interfered with the following? General activity 10 Relation with others 10 Enjoyment of life 10 What TIME of day is your pain at its worst? morning , daytime, evening, and night Sleep (in general) Poor  Pain is worse with: walking, bending, sitting, standing, and some activites Pain improves with: rest, heat/ice, and medication Relief from Meds: 5  Family History  Problem Relation Age of Onset   Hypertension Father    Diabetes Father    Stroke Father    Congestive Heart Failure Father    Lymphoma Paternal Uncle    Ovarian cancer Maternal Grandmother 1   Social History   Socioeconomic History   Marital status: Married    Spouse name: Not on file   Number of children: Not on file   Years of education: Not on file   Highest education level: Not on file  Occupational History   Occupation: Disabled  Tobacco Use   Smoking status: Former    Packs/day: 0.30    Years: 10.00    Total pack years: 3.00    Types: Cigarettes    Quit date: 09/30/2016    Years since quitting: 5.4    Passive exposure: Never   Smokeless tobacco: Never  Vaping Use   Vaping Use: Never used  Substance and Sexual Activity   Alcohol use: No   Drug use: No   Sexual activity: Yes    Birth control/protection: Condom  Other Topics Concern   Not on file  Social History  Narrative   Not on file   Social Determinants of Health   Financial Resource Strain: Not on file  Food Insecurity: Not on file  Transportation Needs: Not on file  Physical Activity: Not on file  Stress: Not on file  Social Connections: Not on file   Past Surgical History:  Procedure Laterality Date   ARTHROSCOPY KNEE W/ DRILLING     right knee    ENDOBRONCHIAL ULTRASOUND Bilateral 03/04/2017   Procedure: ENDOBRONCHIAL ULTRASOUND;  Surgeon: Leslye Peer, MD;  Location: [facility];  Service: Cardiopulmonary;  Laterality: Bilateral;   left knee inner growth plate removed     to straighten leg   Past Surgical History:  Procedure Laterality Date   ARTHROSCOPY KNEE W/ DRILLING     right knee    ENDOBRONCHIAL ULTRASOUND Bilateral 03/04/2017   Procedure: ENDOBRONCHIAL ULTRASOUND;  Surgeon: Leslye Peer, MD;  Location: [facility];  Service: Cardiopulmonary;  Laterality: Bilateral;   left knee inner growth plate removed     to straighten leg   Past Medical History:  Diagnosis Date   Anxiety    Depression    Dyspnea    Eczema of hand    Headache    hx of with tooth pain   Obese    Pneumonia  12/2016   Pre-diabetes    Sarcoidosis    Sleep apnea    cpap   BP 115/77   Pulse 76   Ht 5\' 8"  (1.727 m)   Wt (!) 578 lb (262.2 kg)   SpO2 95%   BMI 87.88 kg/m   Opioid Risk Score:   Fall Risk Score:  `1  Depression screen [facility] 2/9     02/25/2022    8:43 AM 01/29/2022    8:38 AM 12/31/2021    9:02 AM 11/29/2021    8:57 AM 11/01/2021   10:25 AM 10/02/2021    9:41 AM 08/30/2021    8:36 AM  Depression screen PHQ 2/9  Decreased Interest 0 0 0 0 0 1 1  Down, Depressed, Hopeless 0 0 0 0 0 1 1  PHQ - 2 Score 0 0 0 0 0 2 2    Review of Systems  Musculoskeletal:  Positive for back pain.       Pain in the right leg and right foot, pain in both knees  All other systems reviewed and are negative.      Objective:   Physical Exam Vitals and nursing note reviewed.   Constitutional:      Appearance: Normal appearance. He is obese.  Musculoskeletal:     Cervical back: Normal range of motion and neck supple.     Comments: Normal Muscle Bulk and Muscle Testing Reveals:  Upper Extremities: Full ROM and Muscle Strength 5/5 Lumbar Paraspinal Tenderness: L-4-L-5 Lower Extremities : Decreased ROM and Muscle Strength 5/5 Bilateral Lower Extremities Flexion Produces Pain into his Bilateral Patella's Arises from Table slowly Antalgic Gait     Skin:    General: Skin is warm and dry.  Neurological:     Mental Status: He is alert and oriented to person, place, and time.  Psychiatric:        Mood and Affect: Mood normal.        Behavior: Behavior normal.         Assessment & Plan:  1.Right Lumbar Radiculitis/ Spinal Stenosis : Continue HEP as Tolerated and Continue to Monitor. 02/25/2022 Refilled: :Oxycodone 10/325 mg one tablet 4 times a day as needed for pain #120. We will continue the opioid monitoring program, this consists of regular clinic visits, examinations, urine drug screen, pill counts as well as use of [place] Controlled Substance Reporting system. A 12 month History has been reviewed on the [place] on 02/25/2022.  2. Thoracic Back Pain: No complaints today.Continue HEP as Tolerated . Continue to Monitor. 02/25/2022 3. Right Shoulder Pain: No Complaints today. Continue HEP as Tolerated and Continue to Monitor.10/162023 4. Chronic Pain Syndrome: Continue HEP as Tolerated. Continue to Monitor. 02/25/2022 5. Morbid Obesity: Continue Healthy Diet Regimen. Continue to Monitor. 02/25/2022 6. Chronic Bilateral Knee Pain:Continue HEP as Tolerated. Continue to Monitor. 02/25/2022   F/U in 1 Month

## 2022-03-15 NOTE — Progress Notes (Deleted)
Office Visit Note  Patient: Anthony [name]             Date of Birth: 27-May-1980           MRN: [id number]             PCP: Pcp, No Referring: No ref. provider found Visit Date: 03/29/2022   Subjective:  No chief complaint on file.   History of Present Illness: Anthony [name] is a 41 y.o. 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 14 days and prednisone 15 mg daily.  He had some delay in getting the Humira so only started this at home more recently reports taking the fourth dose today before clinic.  He had updated CT imaging of the chest about a week ago that showed resolution of his mediastinal lymphadenopathy but did demonstrate some persistent mild scarring changes.  Back and knee pain symptoms have been stable about the same as before reasonably controlled on his pain medication but definitely limits his mobility does not tolerate walking long distance due to back pain.   Previous HPI 09/18/2021 Anthony [name] is a 41 y.o. 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 He is feeling about the same as at our last visit still join pain worst in bilateral knees and skin rash is present on both palms. Labs from last visit were mostly normal although also showed low TPMT function test.   Previous HPI 08/27/2021 Anthony [name] is a 41 y.o. 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 He previously took methotrexate without definite symptom control. Recently increased back to 30 mg prednisone tapering. He has some chronic joint pain in multiple areas including chronic rotator cuff tear and lumbar spine DJD.  He has longstanding disease with chronic joint pain in both knees since he was a teenager. This  has been attributed to use related and also found to have significant OA on xrays from a few years ago.  Skin disease is also been chronic mostly affecting the palms of bilateral hands.  However starting around 2018 he developed shortness of breath symptoms as well as worsening skin inflammation this led to original diagnosis of sarcoidosis.  He has received off-and-on treatments of prednisone which improved his symptoms significantly.  He did take methotrexate however feels like his joints and skin may have been not improved or possibly worse when on the medication definitely do not see an improvement in symptoms.  More recently in the past year he is also developed some hair loss and pigmentation changes on the scalp.  He has hyperpigmented skin patches on distal legs and developed a right leg ulcer this has been healing over the past 3 weeks following up with wound care treatment is also been on the prednisone during this time.  The most recent respiratory symptom exacerbation in the past few months now on prednisone for least about 6 weeks slowly tapering.  He was recommended for updated chest CT scan is had be rescheduled to a different site for machine weight capacity limit he has not yet gotten this rescheduled again. He denies any involvement of skin on soles of the feet like he has on his hands.  He has used hypotensive topical  steroids for this in the past but not recently.  He denies any eye inflammation problems or vision changes.   09/10/2017 CT Chest IMPRESSION: 1. Marked decrease in size of RIGHT perihilar pulmonary parenchymal nodules and LEFT upper lobe nodule with linear platelike scarring remaining. 2. Mild mediastinal adenopathy is similar to slightly decreased.   No Rheumatology ROS completed.   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[place]) 02/24/2017   Reactive depression 01/16/2017   Anticoagulated by anticoagulation treatment 01/16/2017    Past Medical History:  Diagnosis Date   Anxiety    Depression    Dyspnea    Eczema of hand    Headache    hx of with tooth pain   Obese    Pneumonia    12/2016   Pre-diabetes    Sarcoidosis    Sleep apnea    cpap    Family History  Problem Relation Age of Onset   Hypertension Father    Diabetes Father    Stroke Father    Congestive Heart Failure Father    Lymphoma Paternal Uncle    Ovarian cancer Maternal Grandmother 15   Past Surgical History:  Procedure Laterality Date   ARTHROSCOPY KNEE W/ DRILLING     right knee    ENDOBRONCHIAL ULTRASOUND Bilateral 03/04/2017   Procedure: ENDOBRONCHIAL ULTRASOUND;  Surgeon: Collene Gobble, MD;  Location: [facility];  Service: Cardiopulmonary;  Laterality: Bilateral;   left knee inner growth plate removed     to straighten leg   Social History   Social History Narrative   Not on file    There is no immunization history on file for this patient.   Objective: Vital Signs: There were no vitals taken for this visit.   Physical Exam   Musculoskeletal Exam: ***  CDAI Exam: CDAI Score: -- Patient Global: --; Provider Global: -- Swollen: --; Tender: -- Joint Exam 03/29/2022   No joint exam has been documented for this visit   There is currently no information documented on the homunculus. Go to the Rheumatology activity and complete the homunculus joint exam.  Investigation: No additional findings.  Imaging: No results found.  Recent Labs: Lab Results  Component Value Date   WBC 8.0 12/25/2021   HGB 15.2 12/25/2021    PLT 280 12/25/2021   NA 141 12/25/2021   K 3.8 12/25/2021   CL 105 12/25/2021   CO2 25 12/25/2021   GLUCOSE 97 12/25/2021   BUN 9 12/25/2021   CREATININE 0.95 12/25/2021   BILITOT 0.3 12/25/2021   ALKPHOS 66 05/10/2021   AST 18 12/25/2021   ALT 25 12/25/2021   PROT 7.2 12/25/2021   ALBUMIN 4.1 05/10/2021   CALCIUM 8.9 12/25/2021   GFRAA >60 01/02/2017   QFTBGOLDPLUS NEGATIVE 08/27/2021    Speciality Comments: No specialty comments available.  Procedures:  No procedures performed Allergies: Xarelto [rivaroxaban]   Assessment / Plan:     Visit Diagnoses: No diagnosis found.  ***  Orders: No orders of the defined types were placed in this encounter.  No orders  of the defined types were placed in this encounter.    Follow-Up Instructions: No follow-ups on file.   Jairo Ben, RT  Note - This record has been created using Animal nutritionist.  Chart creation errors have been sought, but may not always  have been located. Such creation errors do not reflect on  the standard of medical care.

## 2022-03-19 ENCOUNTER — Other Ambulatory Visit: Payer: Self-pay

## 2022-03-19 ENCOUNTER — Encounter (HOSPITAL_BASED_OUTPATIENT_CLINIC_OR_DEPARTMENT_OTHER): Payer: Self-pay

## 2022-03-19 ENCOUNTER — Emergency Department (HOSPITAL_BASED_OUTPATIENT_CLINIC_OR_DEPARTMENT_OTHER): Payer: Commercial Managed Care - HMO | Admitting: Radiology

## 2022-03-19 ENCOUNTER — Emergency Department (HOSPITAL_BASED_OUTPATIENT_CLINIC_OR_DEPARTMENT_OTHER)
Admission: EM | Admit: 2022-03-19 | Discharge: 2022-03-19 | Disposition: A | Discharge status: Home / Self Care | Payer: Commercial Managed Care - HMO | Attending: Emergency Medicine | Admitting: Emergency Medicine

## 2022-03-19 DIAGNOSIS — M25572 Pain in left ankle and joints of left foot: Secondary | ICD-10-CM | POA: Insufficient documentation

## 2022-03-19 DIAGNOSIS — W19XXXA Unspecified fall, initial encounter: Secondary | ICD-10-CM | POA: Diagnosis not present

## 2022-03-19 MED ORDER — IBUPROFEN 600 MG PO TABS: 600.0000 mg | ORAL_TABLET | Freq: Four times a day (QID) | ORAL | 0 refills | Status: DC | PRN

## 2022-03-19 MED ORDER — IBUPROFEN 800 MG PO TABS
800.0000 mg | ORAL_TABLET | Freq: Once | ORAL | Status: AC
  Administered 2022-03-19: 800 mg via ORAL
  Filled 2022-03-19: qty 1

## 2022-03-19 NOTE — ED Triage Notes (Signed)
Pt reports pain and swelling to his left ankle for the past 6 weeks. States he recently slipped in the shower and believes the pain and swelling could be coming from that.

## 2022-03-19 NOTE — Discharge Instructions (Signed)
Today you were seen in the emergency department for your ankle pain.    In the emergency department you had x-rays that did not show any broken bones.    At home, please use the ice and elevate your ankle.  Use the walking boot to help with the pain.    Follow-up with sports medicine in 1 week to discuss your pain.  Return immediately to the emergency department if you experience any of the following: Worsening pain, numbness, fever, or any other concerning symptoms.    Thank you for visiting our Emergency Department. It was a pleasure taking care of you today.

## 2022-03-19 NOTE — ED Provider Notes (Signed)
  [facility] EMERGENCY DEPT Provider Note   CSN: [id number] Arrival date & time: 03/19/22  1052     History {Add pertinent medical, surgical, social history, OB history to HPI:1} Chief Complaint  Patient presents with   Ankle Pain    Anthony [name] is a 41 y.o. male.  6 R knee wks 5 wks Ankle hurting  No other symptoms L ankle right knee No reactive or septic arthritis sxs        Home Medications Prior to Admission medications   Medication Sig Start Date End Date Taking? Authorizing Provider  albuterol (VENTOLIN HFA) 108 (90 Base) MCG/ACT inhaler Inhale 2 puffs into the lungs every 6 (six) hours as needed for wheezing or shortness of breath. 02/19/22   Collene Gobble, MD  diclofenac (VOLTAREN) 75 MG EC tablet TAKE 1 TABLET BY MOUTH TWICE DAILY 02/13/22   Raulkar, Clide Deutscher, MD  HUMIRA PEN 40 MG/0.4ML PNKT INJECT 40 MG (0.4 ML) UNDER THE SKIN EVERY 14 DAYS 02/11/22   Rice, Resa Miner, MD  Menthol, Topical Analgesic, (ICY HOT EX) Apply 1 application topically daily as needed (muscle pain).    [provider]  oxyCODONE-acetaminophen (PERCOCET) 10-325 MG tablet Take 1 tablet by mouth 4 (four) times daily as needed for pain. 02/25/22 02/25/23  Bayard Hugger, NP  predniSONE (DELTASONE) 10 MG tablet Take 1 tablet (10 mg total) by mouth daily. 02/19/22   Collene Gobble, MD      Allergies    Xarelto [rivaroxaban]    Review of Systems   Review of Systems  Physical Exam Updated Vital Signs BP (!) 164/83   Pulse 64   Temp 98.3 F (36.8 C) (Tympanic)   Resp 17   Ht 5\' 8"  (1.727 m)   Wt (!) 259.5 kg   SpO2 99%   BMI 86.97 kg/m  Physical Exam  ED Results / Procedures / Treatments   Labs (all labs ordered are listed, but only abnormal results are displayed) Labs Reviewed - No data to display  EKG None  Radiology DG Ankle Complete Left  Result Date: 03/19/2022 CLINICAL DATA:  Recent left ankle injury. EXAM: LEFT ANKLE COMPLETE - 3+  VIEW COMPARISON:  None Available. FINDINGS: The ankle mortise is maintained. No acute ankle fracture. No osteochondral lesion. The mid and hindfoot bony structures are intact. IMPRESSION: No acute bony findings. Electronically Signed   By: Marijo Sanes M.D.   On: 03/19/2022 13:16    Procedures Procedures  {Document cardiac monitor, telemetry assessment procedure when appropriate:1}  Medications Ordered in ED Medications - No data to display  ED Course/ Medical Decision Making/ A&P                           Medical Decision Making Amount and/or Complexity of Data Reviewed Radiology: ordered.  Risk Prescription drug management.   ***  {Document critical care time when appropriate:1} {Document review of labs and clinical decision tools ie heart score, Chads2Vasc2 etc:1}  {Document your independent review of radiology images, and any outside records:1} {Document your discussion with family members, caretakers, and with consultants:1} {Document social determinants of health affecting pt's care:1} {Document your decision making why or why not admission, treatments were needed:1} Final Clinical Impression(s) / ED Diagnoses Final diagnoses:  None    Rx / DC Orders ED Discharge Orders     None

## 2022-03-27 ENCOUNTER — Encounter: Payer: Self-pay | Admitting: Registered Nurse

## 2022-03-27 ENCOUNTER — Encounter: Payer: Commercial Managed Care - HMO | Attending: Physical Medicine and Rehabilitation | Admitting: Registered Nurse

## 2022-03-27 VITALS — BP 115/71 | HR 78 | Ht 68.0 in | Wt >= 6400 oz

## 2022-03-27 DIAGNOSIS — M25572 Pain in left ankle and joints of left foot: Secondary | ICD-10-CM | POA: Diagnosis not present

## 2022-03-27 DIAGNOSIS — M25562 Pain in left knee: Secondary | ICD-10-CM | POA: Diagnosis not present

## 2022-03-27 DIAGNOSIS — G8929 Other chronic pain: Secondary | ICD-10-CM | POA: Diagnosis present

## 2022-03-27 DIAGNOSIS — M48062 Spinal stenosis, lumbar region with neurogenic claudication: Secondary | ICD-10-CM

## 2022-03-27 DIAGNOSIS — M25561 Pain in right knee: Secondary | ICD-10-CM | POA: Diagnosis not present

## 2022-03-27 MED ORDER — OXYCODONE-ACETAMINOPHEN 10-325 MG PO TABS: 1.0000 | ORAL_TABLET | Freq: Four times a day (QID) | ORAL | 0 refills | Status: DC | PRN

## 2022-03-27 NOTE — Progress Notes (Signed)
Subjective:    Patient ID: Anthony [name], male    DOB: [birth year], 41 y.o.   MRN: [id number]  HPI: Anthony [name] is a 41 y.o. male who returns for follow up appointment for chronic pain and medication refill. He states his pain is located in his lower back, bilateral knees and left ankle pain.  He went to the emergency room on 03/19/2022 for left ankle pain, note was reviewed. He rates his pain 9. His current exercise regime is walking short distances.   Mr. Baer Morphine equivalent is 60.00 MME.   Last UDS was Performed on 12/31/2021, it was consistent.      Pain Inventory Average Pain 7 Pain Right Now 8 My pain is constant, sharp, burning, dull, and aching  In the last 24 hours, has pain interfered with the following? General activity 10 Relation with others 10 Enjoyment of life 10 What TIME of day is your pain at its worst? morning , daytime, evening, and night Sleep (in general) Poor  Pain is worse with: walking, sitting, inactivity, and standing Pain improves with: rest, heat/ice, and medication Relief from Meds: 4  Family History  Problem Relation Age of Onset   Hypertension Father    Diabetes Father    Stroke Father    Congestive Heart Failure Father    Lymphoma Paternal Uncle    Ovarian cancer Maternal Grandmother 1   Social History   Socioeconomic History   Marital status: Married    Spouse name: Not on file   Number of children: Not on file   Years of education: Not on file   Highest education level: Not on file  Occupational History   Occupation: Disabled  Tobacco Use   Smoking status: Former    Packs/day: 0.30    Years: 10.00    Total pack years: 3.00    Types: Cigarettes    Quit date: 09/30/2016    Years since quitting: 5.4    Passive exposure: Never   Smokeless tobacco: Never  Vaping Use   Vaping Use: Never used  Substance and Sexual Activity   Alcohol use: No   Drug use: No   Sexual activity: Yes    Birth control/protection:  Condom  Other Topics Concern   Not on file  Social History Narrative   Not on file   Social Determinants of Health   Financial Resource Strain: Not on file  Food Insecurity: Not on file  Transportation Needs: Not on file  Physical Activity: Not on file  Stress: Not on file  Social Connections: Not on file   Past Surgical History:  Procedure Laterality Date   ARTHROSCOPY KNEE W/ DRILLING     right knee    ENDOBRONCHIAL ULTRASOUND Bilateral 03/04/2017   Procedure: ENDOBRONCHIAL ULTRASOUND;  Surgeon: Leslye Peer, MD;  Location: [facility];  Service: Cardiopulmonary;  Laterality: Bilateral;   left knee inner growth plate removed     to straighten leg   Past Surgical History:  Procedure Laterality Date   ARTHROSCOPY KNEE W/ DRILLING     right knee    ENDOBRONCHIAL ULTRASOUND Bilateral 03/04/2017   Procedure: ENDOBRONCHIAL ULTRASOUND;  Surgeon: Leslye Peer, MD;  Location: [facility];  Service: Cardiopulmonary;  Laterality: Bilateral;   left knee inner growth plate removed     to straighten leg   Past Medical History:  Diagnosis Date   Anxiety    Depression    Dyspnea    Eczema of hand    Headache  hx of with tooth pain   Obese    Pneumonia    12/2016   Pre-diabetes    Sarcoidosis    Sleep apnea    cpap   There were no vitals taken for this visit.  Opioid Risk Score:   Fall Risk Score:  `1  Depression screen [facility] 2/9     02/25/2022    8:43 AM 01/29/2022    8:38 AM 12/31/2021    9:02 AM 11/29/2021    8:57 AM 11/01/2021   10:25 AM 10/02/2021    9:41 AM 08/30/2021    8:36 AM  Depression screen [facility] 2/9  Decreased Interest 0 0 0 0 0 1 1  Down, Depressed, Hopeless 0 0 0 0 0 1 1  PHQ - 2 Score 0 0 0 0 0 2 2    Review of Systems  Musculoskeletal:  Positive for back pain and gait problem.       PAIN IN BOTH KNEES AND RIGHT UPPER LEG WITH RIGHT HIP PAIN LEFT ANKLE PAIN  All other systems reviewed and are negative.      Objective:   Physical  Exam Vitals and nursing note reviewed.  Constitutional:      Appearance: Normal appearance. He is obese.  Cardiovascular:     Rate and Rhythm: Normal rate and regular rhythm.     Pulses: Normal pulses.     Heart sounds: Normal heart sounds.  Pulmonary:     Effort: Pulmonary effort is normal.     Breath sounds: Normal breath sounds.  Musculoskeletal:     Cervical back: Normal range of motion and neck supple.     Comments: Normal Muscle Bulk and Muscle Testing Reveals:  Upper Extremities: Full ROM and Muscle Strength 5/5 Lumbar Paraspinal Tenderness: L-4-L-5 Lower Extremities: Decreased ROM and Muscle Strength 5/5 Bilateral Lower Extremities Flexion Produces Pain into her Bilateral Patella's Arises from Table Slowly Antalgic  Gait     Skin:    General: Skin is warm and dry.  Neurological:     Mental Status: He is alert and oriented to person, place, and time.  Psychiatric:        Mood and Affect: Mood normal.        Behavior: Behavior normal.         Assessment & Plan:  1.Right Lumbar Radiculitis/ Spinal Stenosis : Continue HEP as Tolerated and Continue to Monitor. 03/27/2022 Refilled: :Oxycodone 10/325 mg one tablet 4 times a day as needed for pain #120. We will continue the opioid monitoring program, this consists of regular clinic visits, examinations, urine drug screen, pill counts as well as use of [place] Controlled Substance Reporting system. A 12 month History has been reviewed on the [place] Controlled Substance Reporting System on 03/27/2022.  2. Thoracic Back Pain: No complaints today.Continue HEP as Tolerated . Continue to Monitor. 03/27/2022 3. Right Shoulder Pain: No Complaints today. Continue HEP as Tolerated and Continue to Monitor.11/152023 4. Chronic Pain Syndrome: Continue HEP as Tolerated. Continue to Monitor. 03/27/2022 5. Morbid Obesity: Continue Healthy Diet Regimen. Continue to Monitor. 03/27/2022 6. Chronic Bilateral Knee Pain:Continue HEP  as Tolerated. Continue to Monitor. 03/27/2022 7. Left Ankle Pain: He was seen in the ED note was reviewed, he will F/U with Ortho.    F/U in 1 Month

## 2022-03-27 NOTE — Progress Notes (Deleted)
Subjective:    Patient ID: Anthony [name], male    DOB: Dec 11, 1980, 41 y.o.   MRN: [id number]  HPI  Pain Inventory Average Pain {NUMBERS; 0-10:5044} Pain Right Now {NUMBERS; 0-10:5044} My pain is {PAIN DESCRIPTION:21022940}  In the last 24 hours, has pain interfered with the following? General activity {NUMBERS; 0-10:5044} Relation with others {NUMBERS; 0-10:5044} Enjoyment of life {NUMBERS; 0-10:5044} What TIME of day is your pain at its worst? {time of day:24191} Sleep (in general) {BHH GOOD/FAIR/POOR:22877}  Pain is worse with: {ACTIVITIES:21022942} Pain improves with: {PAIN IMPROVES HENI:77824235} Relief from Meds: {NUMBERS; 0-10:5044}  Family History  Problem Relation Age of Onset   Hypertension Father    Diabetes Father    Stroke Father    Congestive Heart Failure Father    Lymphoma Paternal Uncle    Ovarian cancer Maternal Grandmother 84   Social History   Socioeconomic History   Marital status: Married    Spouse name: Not on file   Number of children: Not on file   Years of education: Not on file   Highest education level: Not on file  Occupational History   Occupation: Disabled  Tobacco Use   Smoking status: Former    Packs/day: 0.30    Years: 10.00    Total pack years: 3.00    Types: Cigarettes    Quit date: 09/30/2016    Years since quitting: 5.4    Passive exposure: Never   Smokeless tobacco: Never  Vaping Use   Vaping Use: Never used  Substance and Sexual Activity   Alcohol use: No   Drug use: No   Sexual activity: Yes    Birth control/protection: Condom  Other Topics Concern   Not on file  Social History Narrative   Not on file   Social Determinants of Health   Financial Resource Strain: Not on file  Food Insecurity: Not on file  Transportation Needs: Not on file  Physical Activity: Not on file  Stress: Not on file  Social Connections: Not on file   Past Surgical History:  Procedure Laterality Date   ARTHROSCOPY KNEE W/  DRILLING     right knee    ENDOBRONCHIAL ULTRASOUND Bilateral 03/04/2017   Procedure: ENDOBRONCHIAL ULTRASOUND;  Surgeon: Leslye Peer, MD;  Location: [facility];  Service: Cardiopulmonary;  Laterality: Bilateral;   left knee inner growth plate removed     to straighten leg   Past Surgical History:  Procedure Laterality Date   ARTHROSCOPY KNEE W/ DRILLING     right knee    ENDOBRONCHIAL ULTRASOUND Bilateral 03/04/2017   Procedure: ENDOBRONCHIAL ULTRASOUND;  Surgeon: Leslye Peer, MD;  Location: [facility];  Service: Cardiopulmonary;  Laterality: Bilateral;   left knee inner growth plate removed     to straighten leg   Past Medical History:  Diagnosis Date   Anxiety    Depression    Dyspnea    Eczema of hand    Headache    hx of with tooth pain   Obese    Pneumonia    12/2016   Pre-diabetes    Sarcoidosis    Sleep apnea    cpap   There were no vitals taken for this visit.  Opioid Risk Score:   Fall Risk Score:  `1  Depression screen [facility] 2/9     02/25/2022    8:43 AM 01/29/2022    8:38 AM 12/31/2021    9:02 AM 11/29/2021    8:57 AM 11/01/2021   10:25 AM  10/02/2021    9:41 AM 08/30/2021    8:36 AM  Depression screen [facility] 2/9  Decreased Interest 0 0 0 0 0 1 1  Down, Depressed, Hopeless 0 0 0 0 0 1 1  PHQ - 2 Score 0 0 0 0 0 2 2    Review of Systems     Objective:   Physical Exam        Assessment & Plan:

## 2022-03-29 ENCOUNTER — Ambulatory Visit: Payer: Commercial Managed Care - HMO | Attending: Internal Medicine | Admitting: Internal Medicine

## 2022-03-29 DIAGNOSIS — R590 Localized enlarged lymph nodes: Secondary | ICD-10-CM

## 2022-03-29 DIAGNOSIS — M170 Bilateral primary osteoarthritis of knee: Secondary | ICD-10-CM

## 2022-03-29 DIAGNOSIS — D862 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: Secondary | ICD-10-CM

## 2022-03-29 DIAGNOSIS — G8929 Other chronic pain: Secondary | ICD-10-CM

## 2022-03-29 DIAGNOSIS — Z79899 Other long term (current) drug therapy: Secondary | ICD-10-CM

## 2022-03-29 DIAGNOSIS — R918 Other nonspecific abnormal finding of lung field: Secondary | ICD-10-CM

## 2022-04-01 ENCOUNTER — Telehealth: Payer: Self-pay | Admitting: Registered Nurse

## 2022-04-01 MED ORDER — OXYCODONE-ACETAMINOPHEN 10-325 MG PO TABS: 1.0000 | ORAL_TABLET | Freq: Four times a day (QID) | ORAL | 0 refills | Status: DC | PRN

## 2022-04-01 NOTE — Telephone Encounter (Signed)
Current pharmacy does not have Percocet. Please forward script to [facility] on [address]

## 2022-04-01 NOTE — Telephone Encounter (Signed)
PMP was Reviewed.  Oxycodone e-scribed to [facility]. [facility] out of stock. Anthony [name] is aware via My-Chart message.

## 2022-04-24 ENCOUNTER — Encounter: Payer: Commercial Managed Care - HMO | Attending: Physical Medicine and Rehabilitation | Admitting: Registered Nurse

## 2022-04-24 ENCOUNTER — Encounter: Payer: Self-pay | Admitting: Registered Nurse

## 2022-04-24 VITALS — BP 154/84 | HR 68 | Ht 68.0 in | Wt >= 6400 oz

## 2022-04-24 DIAGNOSIS — M25561 Pain in right knee: Secondary | ICD-10-CM

## 2022-04-24 DIAGNOSIS — Z79891 Long term (current) use of opiate analgesic: Secondary | ICD-10-CM | POA: Diagnosis present

## 2022-04-24 DIAGNOSIS — G894 Chronic pain syndrome: Secondary | ICD-10-CM | POA: Diagnosis present

## 2022-04-24 DIAGNOSIS — G8929 Other chronic pain: Secondary | ICD-10-CM | POA: Diagnosis present

## 2022-04-24 DIAGNOSIS — M48062 Spinal stenosis, lumbar region with neurogenic claudication: Secondary | ICD-10-CM | POA: Diagnosis present

## 2022-04-24 DIAGNOSIS — Z5181 Encounter for therapeutic drug level monitoring: Secondary | ICD-10-CM | POA: Diagnosis present

## 2022-04-24 DIAGNOSIS — M546 Pain in thoracic spine: Secondary | ICD-10-CM

## 2022-04-24 DIAGNOSIS — M25572 Pain in left ankle and joints of left foot: Secondary | ICD-10-CM | POA: Diagnosis present

## 2022-04-24 DIAGNOSIS — M25562 Pain in left knee: Secondary | ICD-10-CM

## 2022-04-24 MED ORDER — OXYCODONE-ACETAMINOPHEN 10-325 MG PO TABS: 1.0000 | ORAL_TABLET | Freq: Four times a day (QID) | ORAL | 0 refills | Status: DC | PRN

## 2022-04-24 NOTE — Progress Notes (Signed)
Subjective:    Patient ID: Anthony [name], male    DOB: 03-14-1981, 41 y.o.   MRN: [id number]  HPI: Anthony [name] is a 41 y.o. male who returns for follow up appointment for chronic pain and medication refill. He states his pain is located in his lower back, bilateral knees and left ankle pain. He rates his pain 7. His current exercise regime is walking short distances  and performing stretching exercises.  Mr. Hauss Morphine equivalent is 60.00 MME.   Last UDS was Performed 12/31/2021, it was consistent,    Pain Inventory Average Pain 8 Pain Right Now 7 My pain is sharp, burning, dull, tingling, and aching  In the last 24 hours, has pain interfered with the following? General activity 10 Relation with others 10 Enjoyment of life 10 What TIME of day is your pain at its worst? morning , daytime, evening, and night Sleep (in general) Poor  Pain is worse with: walking, bending, sitting, inactivity, and standing Pain improves with: rest, heat/ice, and medication Relief from Meds: 5  Family History  Problem Relation Age of Onset   Hypertension Father    Diabetes Father    Stroke Father    Congestive Heart Failure Father    Lymphoma Paternal Uncle    Ovarian cancer Maternal Grandmother 79   Social History   Socioeconomic History   Marital status: Married    Spouse name: Not on file   Number of children: Not on file   Years of education: Not on file   Highest education level: Not on file  Occupational History   Occupation: Disabled  Tobacco Use   Smoking status: Former    Packs/day: 0.30    Years: 10.00    Total pack years: 3.00    Types: Cigarettes    Quit date: 09/30/2016    Years since quitting: 5.5    Passive exposure: Never   Smokeless tobacco: Never  Vaping Use   Vaping Use: Never used  Substance and Sexual Activity   Alcohol use: No   Drug use: No   Sexual activity: Yes    Birth control/protection: Condom  Other Topics Concern   Not on file  Social  History Narrative   Not on file   Social Determinants of Health   Financial Resource Strain: Not on file  Food Insecurity: Not on file  Transportation Needs: Not on file  Physical Activity: Not on file  Stress: Not on file  Social Connections: Not on file   Past Surgical History:  Procedure Laterality Date   ARTHROSCOPY KNEE W/ DRILLING     right knee    ENDOBRONCHIAL ULTRASOUND Bilateral 03/04/2017   Procedure: ENDOBRONCHIAL ULTRASOUND;  Surgeon: Leslye Peer, MD;  Location: [facility];  Service: Cardiopulmonary;  Laterality: Bilateral;   left knee inner growth plate removed     to straighten leg   Past Surgical History:  Procedure Laterality Date   ARTHROSCOPY KNEE W/ DRILLING     right knee    ENDOBRONCHIAL ULTRASOUND Bilateral 03/04/2017   Procedure: ENDOBRONCHIAL ULTRASOUND;  Surgeon: Leslye Peer, MD;  Location: [facility];  Service: Cardiopulmonary;  Laterality: Bilateral;   left knee inner growth plate removed     to straighten leg   Past Medical History:  Diagnosis Date   Anxiety    Depression    Dyspnea    Eczema of hand    Headache    hx of with tooth pain   Obese  Pneumonia    12/2016   Pre-diabetes    Sarcoidosis    Sleep apnea    cpap   There were no vitals taken for this visit.  Opioid Risk Score:   Fall Risk Score:  `1  Depression screen [facility] 2/9     03/27/2022    9:06 AM 02/25/2022    8:43 AM 01/29/2022    8:38 AM 12/31/2021    9:02 AM 11/29/2021    8:57 AM 11/01/2021   10:25 AM 10/02/2021    9:41 AM  Depression screen PHQ 2/9  Decreased Interest 0 0 0 0 0 0 1  Down, Depressed, Hopeless 0 0 0 0 0 0 1  PHQ - 2 Score 0 0 0 0 0 0 2    Review of Systems     Objective:   Physical Exam Vitals and nursing note reviewed.  Constitutional:      Appearance: Normal appearance. He is obese.  Cardiovascular:     Rate and Rhythm: Normal rate and regular rhythm.     Pulses: Normal pulses.     Heart sounds: Normal heart sounds.   Pulmonary:     Effort: Pulmonary effort is normal.     Breath sounds: Normal breath sounds.  Musculoskeletal:     Cervical back: Normal range of motion and neck supple.     Comments: Normal Muscle Bulk and Muscle Testing Reveals:  Upper Extremities: Full ROM and Muscle Strength 5/5  Lumbar Paraspinal Tenderness: L-4-L-5 Lower Extremities: Decreased ROM and Muscle Strength 5/5 Bilateral Lower Extremities Flexion Produces Pain into his bilateral Patella's Wearing Left [place] Arises from Table Slowly Antalgic   Gait     Skin:    General: Skin is warm and dry.  Neurological:     Mental Status: He is alert and oriented to person, place, and time.  Psychiatric:        Mood and Affect: Mood normal.        Behavior: Behavior normal.           Assessment & Plan:  1.Right Lumbar Radiculitis/ Spinal Stenosis : Continue HEP as Tolerated and Continue to Monitor. 04/24/2022 Refilled: :Oxycodone 10/325 mg one tablet 4 times a day as needed for pain #120. We will continue the opioid monitoring program, this consists of regular clinic visits, examinations, urine drug screen, pill counts as well as use of [place] Controlled Substance Reporting system. A 12 month History has been reviewed on the [place] Controlled Substance Reporting System on 04/24/2022.  2. Thoracic Back Pain: No complaints today.Continue HEP as Tolerated . Continue to Monitor. 04/24/2022 3. Right Shoulder Pain: No Complaints today. Continue HEP as Tolerated and Continue to Monitor.12/132023 4. Chronic Pain Syndrome: Continue HEP as Tolerated. Continue to Monitor. 04/24/2022 5. Morbid Obesity: Continue Healthy Diet Regimen. Continue to Monitor. 04/24/2022 6. Chronic Bilateral Knee Pain:Continue HEP as Tolerated. Continue to Monitor. 04/24/2022 7. Left Ankle Pain: He was seen in the ED note was reviewed, he will F/U with Ortho.    F/U in 1 Month

## 2022-04-26 ENCOUNTER — Ambulatory Visit: Payer: Commercial Managed Care - HMO | Admitting: Registered Nurse

## 2022-04-30 ENCOUNTER — Telehealth: Payer: Self-pay | Admitting: Internal Medicine

## 2022-04-30 ENCOUNTER — Ambulatory Visit: Payer: Commercial Managed Care - HMO | Attending: Internal Medicine | Admitting: Internal Medicine

## 2022-04-30 ENCOUNTER — Encounter: Payer: Self-pay | Admitting: Internal Medicine

## 2022-04-30 VITALS — BP 111/73 | HR 73 | Resp 16 | Ht 68.0 in | Wt >= 6400 oz

## 2022-04-30 DIAGNOSIS — L309 Dermatitis, unspecified: Secondary | ICD-10-CM | POA: Diagnosis not present

## 2022-04-30 DIAGNOSIS — D862 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: Secondary | ICD-10-CM

## 2022-04-30 DIAGNOSIS — M25561 Pain in right knee: Secondary | ICD-10-CM

## 2022-04-30 DIAGNOSIS — G8929 Other chronic pain: Secondary | ICD-10-CM

## 2022-04-30 DIAGNOSIS — R590 Localized enlarged lymph nodes: Secondary | ICD-10-CM | POA: Diagnosis not present

## 2022-04-30 DIAGNOSIS — Z79899 Other long term (current) drug therapy: Secondary | ICD-10-CM

## 2022-04-30 DIAGNOSIS — M25562 Pain in left knee: Secondary | ICD-10-CM

## 2022-04-30 NOTE — Progress Notes (Signed)
Office Visit Note  Patient: Anthony [name]             Date of Birth: 06/20/1980           MRN: [id number]             PCP: Pcp, No Referring: No ref. provider found Visit Date: 04/30/2022   Subjective:  Follow-up   History of Present Illness: Anthony [name] is a 41 y.o. 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 Prednisone was tapered down form 15 mg after last visit with pulmonology clinic and lung disease appeared controlled without new symptom or image findings.  However subsequently he sustained a fall with right knee and left ankle pain significantly increased afterwards.  He has been in an ankle boot for immobilization did not require any procedure.  He was concerned about his injury susceptibility with long-term prednisone use and discontinued since about 1 month ago.  He did not notice new pulmonary symptoms when stopping the prednisone.  Joint pain has been problematic mostly in the bilateral knees he is taking diclofenac twice daily which helps a lot otherwise he requires a cane for walking due to knee pain.  Skin rashes have increased noticeably on both palms.  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  He does admit to somewhat inconsistent adherence to the Humira often taking some doses late.  Previous HPI 12/25/21 Anthony [name] is a 41 y.o. 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 14 days and prednisone 15 mg daily.  He had some delay in getting the Humira so only started this at home more recently reports taking the fourth dose today before clinic.  He had updated CT imaging of the chest about a week ago that showed resolution of his mediastinal  lymphadenopathy but did demonstrate some persistent mild scarring changes.  Back and knee pain symptoms have been stable about the same as before reasonably controlled on his pain medication but definitely limits his mobility does not tolerate walking long distance due to back pain.   Previous HPI 09/18/2021 Anthony [name] is a 41 y.o. 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 He is feeling about the same as at our last visit still join pain worst in bilateral knees and skin rash is present on both palms. Labs from last visit were mostly normal although also showed low TPMT function test.   Previous HPI 08/27/2021 Anthony [name] is a 41 y.o. 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 He previously took methotrexate without definite symptom control. Recently increased back to 30 mg prednisone tapering. He has some chronic joint pain in multiple areas including chronic rotator cuff tear and lumbar spine DJD.  He has longstanding disease with chronic joint pain in both knees since he was a teenager. This has been attributed to use related and also found to have significant OA on xrays from a few years ago.  Skin disease is also been chronic mostly affecting the palms of bilateral hands.  However starting around 2018 he developed shortness of breath symptoms as well as worsening skin inflammation this led to original diagnosis of  sarcoidosis.  He has received off-and-on treatments of prednisone which improved his symptoms significantly.  He did take methotrexate however feels like his joints and skin may have been not improved or possibly worse when on the medication definitely do not see an improvement in symptoms.  More recently in the past year he is also developed some hair loss and pigmentation changes on the scalp.  He has hyperpigmented skin patches on distal legs and developed a right leg ulcer this has been healing over the past  3 weeks following up with wound care treatment is also been on the prednisone during this time.  The most recent respiratory symptom exacerbation in the past few months now on prednisone for least about 6 weeks slowly tapering.  He was recommended for updated chest CT scan is had be rescheduled to a different site for machine weight capacity limit he has not yet gotten this rescheduled again. He denies any involvement of skin on soles of the feet like he has on his hands.  He has used hypotensive topical steroids for this in the past but not recently.  He denies any eye inflammation problems or vision changes.   09/10/2017 CT Chest IMPRESSION: 1. Marked decrease in size of RIGHT perihilar pulmonary parenchymal nodules and LEFT upper lobe nodule with linear platelike scarring remaining. 2. Mild mediastinal adenopathy is similar to slightly decreased.     Review of Systems  Constitutional:  Positive for fatigue.  HENT:  Negative for mouth sores and mouth dryness.   Eyes: Negative.  Negative for dryness.  Respiratory:  Positive for shortness of breath.   Cardiovascular: Negative.  Negative for chest pain and palpitations.  Gastrointestinal: Negative.  Negative for blood in stool, constipation and diarrhea.  Endocrine: Negative.  Negative for increased urination.  Genitourinary: Negative.  Negative for involuntary urination.  Musculoskeletal:  Positive for joint pain, gait problem, joint pain, joint swelling, myalgias, muscle weakness, morning stiffness, muscle tenderness and myalgias.  Skin:  Positive for rash. Negative for color change, hair loss and sensitivity to sunlight.  Allergic/Immunologic: Negative.  Negative for susceptible to infections.  Neurological:  Negative for dizziness and headaches.  Hematological: Negative.  Negative for swollen glands.  Psychiatric/Behavioral:  Positive for sleep disturbance. Negative for depressed mood. The patient is not nervous/anxious.     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) 02/24/2017   Reactive depression 01/16/2017   Anticoagulated by anticoagulation treatment 01/16/2017    Past Medical History:  Diagnosis Date   Anxiety    Depression    Dyspnea    Eczema of hand    Headache    hx of with tooth pain   Obese    Pneumonia    12/2016   Pre-diabetes    Sarcoidosis    Sleep apnea    cpap    Family History  Problem Relation Age of Onset   Hypertension Father    Diabetes Father    Stroke Father    Congestive Heart Failure Father    Lymphoma Paternal Uncle    Ovarian  cancer Maternal Grandmother 21   Past Surgical History:  Procedure Laterality Date   ARTHROSCOPY KNEE W/ DRILLING     right knee    ENDOBRONCHIAL ULTRASOUND Bilateral 03/04/2017   Procedure: ENDOBRONCHIAL ULTRASOUND;  Surgeon: Leslye Peer, MD;  Location: [facility];  Service: Cardiopulmonary;  Laterality: Bilateral;   left knee inner growth plate removed     to straighten leg   Social History   Social History Narrative   Not on file    There is no immunization history on file for this patient.   Objective: Vital Signs: BP 111/73 (BP Location: Left Arm, Patient Position: Sitting, Cuff Size: Large)   Pulse 73   Resp 16   Ht  (1.727 m)   Wt (!) 574 lb (260.4 kg)   BMI 87.28 kg/m    Physical Exam Constitutional:      Appearance: He is obese.  Eyes:     Conjunctiva/sclera: Conjunctivae normal.  Cardiovascular:     Rate and Rhythm: Normal rate and regular rhythm.  Pulmonary:     Effort: Pulmonary  effort is normal.     Breath sounds: Normal breath sounds.  Lymphadenopathy:     Cervical: No cervical adenopathy.  Skin:    General: Skin is warm and dry.     Findings: Rash present.     Comments: Rash on bilateral palms with hyperpigmented papulosquamous type changes, some fissuring of this thickened or hyperkeratotic areas on the right palm but no erythema associated  Neurological:     Mental Status: He is alert.  Psychiatric:        Mood and Affect: Mood normal.      Musculoskeletal Exam:  Elbows full ROM no tenderness or swelling Wrists full ROM no tenderness or swelling Fingers full ROM no tenderness or swelling Knees full ROM right knee pain with full extension and flexion, no palpable effusions but somewhat limited by body habitus Left ankle in immobilizing boot   Investigation: No additional findings.  Imaging: No results found.  Recent Labs: Lab Results  Component Value Date   WBC 5.0 04/30/2022   HGB 15.0 04/30/2022   PLT 254 04/30/2022   NA 138 04/30/2022   K 4.6 04/30/2022   CL 103 04/30/2022   CO2 29 04/30/2022   GLUCOSE 89 04/30/2022   BUN 9 04/30/2022   CREATININE 0.92 04/30/2022   BILITOT 0.5 04/30/2022   ALKPHOS 66 05/10/2021   AST 28 04/30/2022   ALT 44 04/30/2022   PROT 7.3 04/30/2022   ALBUMIN 4.1 05/10/2021   CALCIUM 9.3 04/30/2022   GFRAA >60 01/02/2017   QFTBGOLDPLUS NEGATIVE 08/27/2021    Speciality Comments: No specialty comments available.  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  May have to follow-up with pulmonology or have repeat imaging to definitively confirm disease activity.  The increase in skin disease activity suggest at least some features of  the sarcoidosis are more active but does not necessarily require treatment escalation for just the symptoms right now.  It is possible we have to consider increasing dose frequency or switching to alternate TNF agent with weight-based dosing availability.  Will check sedimentation rate and CRP for disease activity monitoring.  Continue Humira 40 subcu mg q.  14 days.  High risk medication use - Plan: CBC with Differential/Platelet, C-reactive protein  Checking CBC and CMP for long-term use of Humira medication monitoring and for the ongoing diclofenac treatment monitoring.  Last QuantiFERON screening from April was negative.  Chronic pain of both knees - Plan: Ambulatory referral to Physical Therapy  Bilateral knee pain related to osteoarthritis discussed his weight is almost certainly the biggest factor for this more so than any evidence of inflammatory changes.  Unfortunately he is sedentary most of the time.  He is not able to do much weightbearing activity without increase in pain.  Will refer to physical therapy see if they can identify any strengthening exercises or other good options for him to start helping improve mobility.  Orders: Orders Placed This Encounter  Procedures   Sedimentation rate   COMPLETE METABOLIC PANEL WITH GFR   CBC with Differential/Platelet   C-reactive protein   Ambulatory referral to Physical Therapy   No orders of the defined types were placed in this encounter.    Follow-Up Instructions: Return in about 3 months (around 07/30/2022) for Sarcoid/OA on ADA f/u 28mos.   Fuller Plan, MD  Note - This record has been created using [employer].  Chart creation errors have been sought, but may not always  have been located. Such creation errors do not reflect on  the standard of medical care.

## 2022-04-30 NOTE — Telephone Encounter (Signed)
Patient, while in office, stated Dr. Dimple Casey wanted to know what dermatologist he sees. Patient states he goes to [facility].

## 2022-05-01 LAB — COMPLETE METABOLIC PANEL WITH GFR
AG Ratio: 1.4 (calc) (ref 1.0–2.5)
ALT: 44 U/L (ref 9–46)
AST: 28 U/L (ref 10–40)
Albumin: 4.2 g/dL (ref 3.6–5.1)
Alkaline phosphatase (APISO): 68 U/L (ref 36–130)
BUN: 9 mg/dL (ref 7–25)
CO2: 29 mmol/L (ref 20–32)
Calcium: 9.3 mg/dL (ref 8.6–10.3)
Chloride: 103 mmol/L (ref 98–110)
Creat: 0.92 mg/dL (ref 0.60–1.29)
Globulin: 3.1 g/dL (calc) (ref 1.9–3.7)
Glucose, Bld: 89 mg/dL (ref 65–99)
Potassium: 4.6 mmol/L (ref 3.5–5.3)
Sodium: 138 mmol/L (ref 135–146)
Total Bilirubin: 0.5 mg/dL (ref 0.2–1.2)
Total Protein: 7.3 g/dL (ref 6.1–8.1)
eGFR: 108 mL/min/{1.73_m2} (ref >=60)

## 2022-05-01 LAB — CBC WITH DIFFERENTIAL/PLATELET
Absolute Monocytes: 660 cells/uL (ref 200–950)
Basophils Absolute: 50 cells/uL (ref 0–200)
Basophils Relative: 1 %
Eosinophils Absolute: 240 cells/uL (ref 15–500)
Eosinophils Relative: 4.8 %
HCT: 44.1 % (ref 38.5–50.0)
Hemoglobin: 15 g/dL (ref 13.2–17.1)
Lymphs Abs: 1545 cells/uL (ref 850–3900)
MCH: 31.3 pg (ref 27.0–33.0)
MCHC: 34 g/dL (ref 32.0–36.0)
MCV: 91.9 fL (ref 80.0–100.0)
MPV: 10.9 fL (ref 7.5–12.5)
Monocytes Relative: 13.2 %
Neutro Abs: 2505 cells/uL (ref 1500–7800)
Neutrophils Relative %: 50.1 %
Platelets: 254 10*3/uL (ref 140–400)
RBC: 4.8 10*6/uL (ref 4.20–5.80)
RDW: 13.6 % (ref 11.0–15.0)
Total Lymphocyte: 30.9 %
WBC: 5 10*3/uL (ref 3.8–10.8)

## 2022-05-01 LAB — C-REACTIVE PROTEIN: CRP: 21.9 mg/L — ABNORMAL HIGH (ref <8.0)

## 2022-05-01 LAB — SEDIMENTATION RATE: Sed Rate: 11 mm/h (ref 0–15)

## 2022-05-02 NOTE — Progress Notes (Signed)
Lab results look fine for continuing the current Humira treatment with normal CBC and CMP.  The sedimentation rate test is normal at 11 but his CRP remains increased at 21.9. The baseline for this test can be higher for some patients based on their weight but this probably reflects some real inflammation as well.  I think he is okay to continue staying off of the prednisone for right now as long as not experiencing any respiratory symptom changes but could also ask his pulmonologist about.

## 2022-05-03 ENCOUNTER — Other Ambulatory Visit: Payer: Self-pay | Admitting: Internal Medicine

## 2022-05-03 DIAGNOSIS — Z79899 Other long term (current) drug therapy: Secondary | ICD-10-CM

## 2022-05-03 DIAGNOSIS — D862 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: Secondary | ICD-10-CM

## 2022-05-03 NOTE — Telephone Encounter (Signed)
Next Visit: 08/01/2022  Last Visit: 04/30/2022  Last Fill: 02/11/2022  IP:[id number] of lung with sarcoidosis of lymph nodes   Current Dose per office note 04/30/2022: humira 40 mg [place] q14days   Labs: 04/30/2022 Lab results look fine for continuing the current Humira treatment with normal CBC and CMP.   TB Gold: 08/27/2021   Okay to refill Humira?

## 2022-05-15 NOTE — Therapy (Incomplete)
OUTPATIENT PHYSICAL THERAPY LOWER EXTREMITY EVALUATION   Patient Name: Anthony [name] MRN: [id number] DOB:1981-02-05, 42 y.o., male Today's Date: 05/15/2022  END OF SESSION:   Past Medical History:  Diagnosis Date   Anxiety    Depression    Dyspnea    Eczema of hand    Headache    hx of with tooth pain   Obese    Pneumonia    12/2016   Pre-diabetes    Sarcoidosis    Sleep apnea    cpap   Past Surgical History:  Procedure Laterality Date   ARTHROSCOPY KNEE W/ DRILLING     right knee    ENDOBRONCHIAL ULTRASOUND Bilateral 03/04/2017   Procedure: ENDOBRONCHIAL ULTRASOUND;  Surgeon: Collene Gobble, MD;  Location: [facility];  Service: Cardiopulmonary;  Laterality: Bilateral;   left knee inner growth plate removed     to straighten leg   Patient Active Problem List   Diagnosis Date Noted   High risk medication use 08/27/2021   Chest discomfort 12/22/2019   Well adult exam 06/24/2018   Bilateral knee pain 06/24/2018   Eczema 06/24/2018   Testicular swelling 06/10/2018   Chronic pain 12/05/2017   Morbid obesity ([place]) 09/22/2017   Obstructive sleep apnea 05/19/2017   Constipation 04/10/2017   Mediastinal lymphadenopathy 03/04/2017   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  Rationale for Evaluation and Treatment: {HABREHAB:27488}  ONSET DATE: ***  SUBJECTIVE:   SUBJECTIVE STATEMENT: ***  PERTINENT HISTORY: Anxiety, dyspnea, morbid obesity, pre-diabetes  PAIN:  Are you having pain? {OPRCPAIN:27236}  PRECAUTIONS: {Therapy precautions:24002}  WEIGHT BEARING RESTRICTIONS: {Yes ***/No:24003}  FALLS:  Has patient fallen in last 6 months?  {fallsyesno:27318}  LIVING ENVIRONMENT: Lives with: {OPRC lives with:25569::"lives with their family"} Lives in: {Lives in:25570} Stairs: {opstairs:27293} Has following equipment at home: {Assistive devices:23999}  OCCUPATION: ***  PLOF: {PLOF:24004}  PATIENT GOALS: ***  NEXT MD VISIT:   OBJECTIVE:   DIAGNOSTIC FINDINGS: X-ray left knee 3 views   Diffuse degenerative changes in medial lateral and patellofemoral  compartment joint spaces are relatively preserved but large amount of  osteophytes.  Markedly along medial joint line also between tibiofibular  joint. There is marked varus deformity and some lateral displacement.  Superior and inferior patellar enthesophytes present, patellofemoral  compartment space slightly better preserved compared to right knee. Some  sclerosis possibly degenerative or healed, along looks like medial growth  plate.  No significant joint effusions or abnormal calcifications seen.   Impression   Moderate to severe osteo, also significant varus joint deformity no  obvious inflammatory changes seen   X-ray right knee 3 views   Diffuse degenerative changes in medial lateral and patellofemoral  compartment joint spaces are relatively preserved but large amount of  osteophytes.  There is marked varus deformity and some lateral  displacement. Superior and inferior patellar enthesophytes present. Some  sclerosis possibly degenerative or healed, along looks like medial growth  plate.  No significant joint effusions or abnormal calcifications seen.   Impression   Moderate or severe osteoarthritis, there are some possibly chronic  posttraumatic changes, also significant varus joint deformity no obvious  inflammatory changes seen  PATIENT SURVEYS:  FOTO ***  COGNITION: Overall  cognitive status: {cognition:24006}     SENSATION: {sensation:27233}  EDEMA:  {edema:24020}  MUSCLE LENGTH: Hamstrings: Right *** deg; Left *** deg Marcello Moores test:  Right *** deg; Left *** deg  POSTURE: {posture:25561}  PALPATION: ***  LOWER EXTREMITY ROM:  Active ROM Right eval Left eval  Hip flexion    Hip extension    Hip abduction    Hip adduction    Hip internal rotation    Hip external rotation    Knee flexion    Knee extension    Ankle dorsiflexion    Ankle plantarflexion    Ankle inversion    Ankle eversion     (Blank rows = not tested)  LOWER EXTREMITY MMT:  MMT Right eval Left eval  Hip flexion    Hip extension    Hip abduction    Hip adduction    Hip internal rotation    Hip external rotation    Knee flexion    Knee extension    Ankle dorsiflexion    Ankle plantarflexion    Ankle inversion    Ankle eversion     (Blank rows = not tested)  LOWER EXTREMITY SPECIAL TESTS:  {LEspecialtests:26242}  FUNCTIONAL TESTS:  {Functional tests:24029}  GAIT: Distance walked: *** Assistive device utilized: {Assistive devices:23999} Level of assistance: {Levels of assistance:24026} Comments: ***   TODAY'S TREATMENT:                                                                                                                              DATE: ***    PATIENT EDUCATION:  Education details: *** Person educated: {Person educated:25204} Education method: {Education Method:25205} Education comprehension: {Education Comprehension:25206}  HOME EXERCISE PROGRAM: ***  ASSESSMENT:  CLINICAL IMPRESSION: Patient is a 42 y.o. male who was seen today for physical therapy evaluation and treatment for ***.   OBJECTIVE IMPAIRMENTS: {opptimpairments:25111}.   ACTIVITY LIMITATIONS: {activitylimitations:27494}  PARTICIPATION LIMITATIONS: {participationrestrictions:25113}  PERSONAL FACTORS: {Personal factors:25162} are also affecting patient's functional outcome.   REHAB POTENTIAL: {rehabpotential:25112}  CLINICAL DECISION MAKING: {clinical decision making:25114}  EVALUATION COMPLEXITY: {Evaluation  complexity:25115}   GOALS: Goals reviewed with patient? Yes  SHORT TERM GOALS: Target date: *** *** Baseline: Goal status: {GOALSTATUS:25110}  2.  *** Baseline:  Goal status: {GOALSTATUS:25110}  3.  *** Baseline:  Goal status: {GOALSTATUS:25110}  4.  *** Baseline:  Goal status: {GOALSTATUS:25110}  5.  *** Baseline:  Goal status: {GOALSTATUS:25110}  6.  *** Baseline:  Goal status: {GOALSTATUS:25110}  LONG TERM GOALS: Target date: ***  *** Baseline:  Goal status: {GOALSTATUS:25110}  2.  *** Baseline:  Goal status: {GOALSTATUS:25110}  3.  *** Baseline:  Goal status: {GOALSTATUS:25110}  4.  *** Baseline:  Goal status: {GOALSTATUS:25110}  5.  *** Baseline:  Goal status: {GOALSTATUS:25110}  6.  *** Baseline:  Goal status: {GOALSTATUS:25110}   PLAN:  PT FREQUENCY: {rehab frequency:25116}  PT DURATION: {rehab duration:25117}  PLANNED INTERVENTIONS: {rehab planned interventions:25118::"Therapeutic exercises","Therapeutic activity","Neuromuscular re-education","Balance training","Gait training","Patient/Family education","Self  Care","Joint mobilization"}  PLAN FOR NEXT SESSION: Farley Ly, PT, MPT 05/15/2022, 10:08 AM

## 2022-05-16 ENCOUNTER — Ambulatory Visit: Payer: Commercial Managed Care - HMO | Admitting: Rehabilitative and Restorative Service Providers"

## 2022-05-27 ENCOUNTER — Encounter: Payer: Commercial Managed Care - HMO | Attending: Physical Medicine and Rehabilitation | Admitting: Registered Nurse

## 2022-05-27 ENCOUNTER — Encounter: Payer: Self-pay | Admitting: Registered Nurse

## 2022-05-27 VITALS — BP 150/70 | HR 92 | Ht 68.0 in | Wt >= 6400 oz

## 2022-05-27 DIAGNOSIS — G894 Chronic pain syndrome: Secondary | ICD-10-CM | POA: Diagnosis not present

## 2022-05-27 DIAGNOSIS — G8929 Other chronic pain: Secondary | ICD-10-CM | POA: Diagnosis present

## 2022-05-27 DIAGNOSIS — M48062 Spinal stenosis, lumbar region with neurogenic claudication: Secondary | ICD-10-CM | POA: Diagnosis not present

## 2022-05-27 DIAGNOSIS — Z79891 Long term (current) use of opiate analgesic: Secondary | ICD-10-CM

## 2022-05-27 DIAGNOSIS — M25561 Pain in right knee: Secondary | ICD-10-CM | POA: Diagnosis not present

## 2022-05-27 DIAGNOSIS — M25562 Pain in left knee: Secondary | ICD-10-CM | POA: Diagnosis present

## 2022-05-27 DIAGNOSIS — Z5181 Encounter for therapeutic drug level monitoring: Secondary | ICD-10-CM

## 2022-05-27 MED ORDER — OXYCODONE-ACETAMINOPHEN 10-325 MG PO TABS: 1.0000 | ORAL_TABLET | Freq: Four times a day (QID) | ORAL | 0 refills | Status: DC | PRN

## 2022-05-27 NOTE — Progress Notes (Signed)
Subjective:    Patient ID: Anthony [name], male    DOB: May 27, 1980, 42 y.o.   MRN: [id number]  HPI: Anthony [name] is a 42 y.o. male who returns for follow up appointment for chronic pain and medication refill. He states his pain is located in his lower back and bilateral knee pain. She rates her pain 8. Her current exercise regime is walking and performing stretching exercises.  Anthony [name] Morphine equivalent is 60.00 MME.   Last UDS was Performed on 12/31/2021, it was consistent.    Pain Inventory Average Pain 7 Pain Right Now 8 My pain is sharp, burning, dull, stabbing, and aching  In the last 24 hours, has pain interfered with the following? General activity 10 Relation with others 10 Enjoyment of life 10 What TIME of day is your pain at its worst? morning , daytime, evening, and night Sleep (in general) Fair  Pain is worse with: walking, bending, sitting, inactivity, standing, and unsure Pain improves with: medication Relief from Meds: 6  Family History  Problem Relation Age of Onset   Hypertension Father    Diabetes Father    Stroke Father    Congestive Heart Failure Father    Lymphoma Paternal Uncle    Ovarian cancer Maternal Grandmother 25   Social History   Socioeconomic History   Marital status: Married    Spouse name: Not on file   Number of children: Not on file   Years of education: Not on file   Highest education level: Not on file  Occupational History   Occupation: Disabled  Tobacco Use   Smoking status: Former    Packs/day: 0.30    Years: 10.00    Total pack years: 3.00    Types: Cigarettes    Quit date: 09/30/2016    Years since quitting: 5.6    Passive exposure: Never   Smokeless tobacco: Never  Vaping Use   Vaping Use: Never used  Substance and Sexual Activity   Alcohol use: No   Drug use: No   Sexual activity: Yes    Birth control/protection: Condom  Other Topics Concern   Not on file  Social History Narrative   Not on file    Social Determinants of Health   Financial Resource Strain: Not on file  Food Insecurity: Not on file  Transportation Needs: Not on file  Physical Activity: Not on file  Stress: Not on file  Social Connections: Not on file   Past Surgical History:  Procedure Laterality Date   ARTHROSCOPY KNEE W/ DRILLING     right knee    ENDOBRONCHIAL ULTRASOUND Bilateral 03/04/2017   Procedure: ENDOBRONCHIAL ULTRASOUND;  Surgeon: Collene Gobble, MD;  Location: [facility];  Service: Cardiopulmonary;  Laterality: Bilateral;   left knee inner growth plate removed     to straighten leg   Past Surgical History:  Procedure Laterality Date   ARTHROSCOPY KNEE W/ DRILLING     right knee    ENDOBRONCHIAL ULTRASOUND Bilateral 03/04/2017   Procedure: ENDOBRONCHIAL ULTRASOUND;  Surgeon: Collene Gobble, MD;  Location: [facility];  Service: Cardiopulmonary;  Laterality: Bilateral;   left knee inner growth plate removed     to straighten leg   Past Medical History:  Diagnosis Date   Anxiety    Depression    Dyspnea    Eczema of hand    Headache    hx of with tooth pain   Obese    Pneumonia    12/2016  Pre-diabetes    Sarcoidosis    Sleep apnea    cpap   BP (!) 150/70   Pulse 92   Ht 5\' 8"  (1.727 m)   Wt (!) 573 lb (259.9 kg)   SpO2 92%   BMI 87.12 kg/m   Opioid Risk Score:   Fall Risk Score:  `1  Depression screen [facility] 2/9     05/27/2022    9:09 AM 04/30/2022    2:35 PM 03/27/2022    9:06 AM 02/25/2022    8:43 AM 01/29/2022    8:38 AM 12/31/2021    9:02 AM 11/29/2021    8:57 AM  Depression screen PHQ 2/9  Decreased Interest 0 0 0 0 0 0 0  Down, Depressed, Hopeless 0 0 0 0 0 0 0  PHQ - 2 Score 0 0 0 0 0 0 0      Review of Systems  Musculoskeletal:  Positive for back pain and gait problem.  All other systems reviewed and are negative.     Objective:   Physical Exam Vitals and nursing note reviewed.  Constitutional:      Appearance: Normal appearance. He is obese.   Cardiovascular:     Rate and Rhythm: Normal rate and regular rhythm.     Pulses: Normal pulses.     Heart sounds: Normal heart sounds.  Pulmonary:     Effort: Pulmonary effort is normal.     Breath sounds: Normal breath sounds. No stridor.  Musculoskeletal:     Cervical back: Normal range of motion and neck supple.     Comments: Normal Muscle Bulk and Muscle Testing Reveals:  Upper Extremities: Full ROM and Muscle Strength 5/5 Lumbar Paraspinal Tenderness: L-4-L-5 Lower Extremities : Decreased ROM and Muscle Strength 5/5 Bilateral Lower Extremities Flexion Produces Pain into his bilateral patella's Arises from Table slowly  Antalgic Gait     Skin:    General: Skin is warm and dry.  Neurological:     Mental Status: He is alert and oriented to person, place, and time.  Psychiatric:        Mood and Affect: Mood normal.        Behavior: Behavior normal.         Assessment & Plan:  1.Right Lumbar Radiculitis/ Spinal Stenosis : Continue HEP as Tolerated and Continue to Monitor. 05/27/2022 Refilled: :Oxycodone 10/325 mg one tablet 4 times a day as needed for pain #120. We will continue the opioid monitoring program, this consists of regular clinic visits, examinations, urine drug screen, pill counts as well as use of [place] Controlled Substance Reporting system. A 12 month History has been reviewed on the [facility] on 05/27/2022.  2. Thoracic Back Pain: No complaints today.Continue HEP as Tolerated . Continue to Monitor. 05/27/2022 3. Right Shoulder Pain: No Complaints today. Continue HEP as Tolerated and Continue to Monitor.01/122024 4. Chronic Pain Syndrome: Continue HEP as Tolerated. Continue to Monitor. 05/27/2022 5. Morbid Obesity: Continue Healthy Diet Regimen. Continue to Monitor. 05/27/2022 6. Chronic Bilateral Knee Pain:Continue HEP as Tolerated. Continue to Monitor. 05/27/2022 7. Left Ankle Pain: No complaints today. He was  seen in the ED note was reviewed, he will F/U with Ortho.    F/U in 1 Month

## 2022-06-02 ENCOUNTER — Other Ambulatory Visit: Payer: Self-pay | Admitting: Physical Medicine and Rehabilitation

## 2022-06-06 ENCOUNTER — Encounter (HOSPITAL_BASED_OUTPATIENT_CLINIC_OR_DEPARTMENT_OTHER): Payer: Commercial Managed Care - HMO | Attending: Internal Medicine | Admitting: Internal Medicine

## 2022-06-06 DIAGNOSIS — G4733 Obstructive sleep apnea (adult) (pediatric): Secondary | ICD-10-CM | POA: Insufficient documentation

## 2022-06-06 DIAGNOSIS — D862 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: Secondary | ICD-10-CM

## 2022-06-06 DIAGNOSIS — Z7952 Long term (current) use of systemic steroids: Secondary | ICD-10-CM | POA: Diagnosis not present

## 2022-06-06 DIAGNOSIS — L97812 Non-pressure chronic ulcer of other part of right lower leg with fat layer exposed: Secondary | ICD-10-CM

## 2022-06-06 DIAGNOSIS — I87311 Chronic venous hypertension (idiopathic) with ulcer of right lower extremity: Secondary | ICD-10-CM | POA: Diagnosis not present

## 2022-06-06 DIAGNOSIS — Z6841 Body Mass Index (BMI) 40.0 and over, adult: Secondary | ICD-10-CM | POA: Insufficient documentation

## 2022-06-06 NOTE — Progress Notes (Signed)
RITO, LECOMTE ([id number]) [phone].pdf Page 1 of 4 Visit Report for 06/06/2022 Abuse Risk Screen Details Patient Name: Date of Service: Anthony [name], Anthony [name] 06/06/2022 8:00 A M Medical Record Number: [id number] Patient Account Number: [id number] Date of Birth/Sex: Treating RN: 11/29/1980 (42 y.o. Mare Ferrari Primary Care Sury Wentworth: PCP, NO Other Clinician: Referring Ambrose Wile: Treating Shimshon Narula/Extender: Yaakov Guthrie in Treatment: 0 Abuse Risk Screen Items Answer ABUSE RISK SCREEN: Has anyone close to you tried to hurt or harm you recentlyo No Do you feel uncomfortable with anyone in your familyo No Has anyone forced you do things that you didnt want to doo No Electronic Signature(s) Signed: 06/06/2022 5:00:09 PM By: Sharyn Creamer RN, BSN Entered By: Sharyn Creamer on 06/06/2022 08:25:09 -------------------------------------------------------------------------------- Activities of Daily Living Details Patient Name: Date of Service: Anthony [name], Anthony RO N J. 06/06/2022 8:00 A M Medical Record Number: [id number] Patient Account Number: [id number] Date of Birth/Sex: Treating RN: [birth year] (42 y.o. Mare Ferrari Primary Care Brandelyn Henne: PCP, NO Other Clinician: Referring Atalya Dano: Treating Annitta Fifield/Extender: Yaakov Guthrie in Treatment: 0 Activities of Daily Living Items Answer Activities of Daily Living (Please select one for each item) Drive Automobile Completely Able T Medications ake Completely Able Use T elephone Completely Able Care for Appearance Completely Able Use T oilet Completely Able Bath / Shower Completely Able Dress Self Completely Able Feed Self Completely Able Walk Completely Able Get In / Out Bed Completely Able Housework Completely Able Prepare Meals Completely [place] for Self Completely Able Electronic Signature(s) Signed: 06/06/2022 5:00:09 PM By: Sharyn Creamer RN,  BSN Entered By: Sharyn Creamer on 06/06/2022 08:25:35 [name] ([id number]) [id number] Page 2 of 4 -------------------------------------------------------------------------------- Education Screening Details Patient Name: Date of Service: Anthony [name], Anthony [name] 06/06/2022 8:00 A M Medical Record Number: [id number] Patient Account Number: [id number] Date of Birth/Sex: Treating RN: 07/01/1980 (42 y.o. Mare Ferrari Primary Care Mackensey Bolte: PCP, NO Other Clinician: Referring Shawnetta Lein: Treating Omeed Osuna/Extender: Yaakov Guthrie in Treatment: 0 Learning Preferences/Education Level/Primary Language Learning Preference: Explanation, Demonstration, Printed Material Highest Education Level: College or Above Preferred Language: English Cognitive Barrier Language Barrier: No Translator Needed: No Memory Deficit: No Emotional Barrier: No Cultural/Religious Beliefs Affecting Medical Care: No Physical Barrier Impaired Vision: No Impaired Hearing: No Decreased Hand dexterity: No Knowledge/Comprehension Knowledge Level: High Comprehension Level: High Ability to understand written instructions: High Ability to understand verbal instructions: High Motivation Anxiety Level: Calm Cooperation: Cooperative Education Importance: Acknowledges Need Interest in Health Problems: Asks Questions Perception: Coherent Willingness to Engage in Self-Management High Activities: Readiness to Engage in Self-Management High Activities: Electronic Signature(s) Signed: 06/06/2022 5:00:09 PM By: Sharyn Creamer RN, BSN Entered By: Sharyn Creamer on 06/06/2022 08:26:12 -------------------------------------------------------------------------------- Fall Risk Assessment Details Patient Name: Date of Service: Anthony Deeds RO N J. 06/06/2022 8:00 A M Medical Record Number: [id number] Patient Account Number: [id number] Date of Birth/Sex: Treating RN: 02-20-1981 (42  y.o. Mare Ferrari Primary Care Nevae Pinnix: PCP, NO Other Clinician: Referring Charlaine Utsey: Treating Daisuke Bailey/Extender: Yaakov Guthrie in Treatment: 0 Fall Risk Assessment Items Have you had 2 or more falls in the last 12 monthso 0 Yes Have you had any fall that resulted in injury in the last 12 monthso 0 Yes Anthony [name], Anthony [name] ([id number]) [id number] Page 3 of 4 FALLS RISK SCREEN History of falling - immediate or within 3 months 0 No Secondary diagnosis (Do you have 2 or more medical diagnoseso) 0 No Ambulatory aid  None/bed rest/wheelchair/nurse 0 Yes Crutches/cane/walker 0 No Furniture 0 No Intravenous therapy Access/Saline/Heparin Lock 0 No Gait/Transferring Normal/ bed rest/ wheelchair 0 Yes Weak (short steps with or without shuffle, stooped but able to lift head while walking, may seek 0 No support from furniture) Impaired (short steps with shuffle, may have difficulty arising from chair, head down, impaired 0 No balance) Mental Status Oriented to own ability 0 Yes Electronic Signature(s) Signed: 06/06/2022 5:00:09 PM By: Sharyn Creamer RN, BSN Entered By: Sharyn Creamer on 06/06/2022 08:26:51 -------------------------------------------------------------------------------- Foot Assessment Details Patient Name: Date of Service: Anthony Deeds RO N J. 06/06/2022 8:00 A M Medical Record Number: [id number] Patient Account Number: [id number] Date of Birth/Sex: Treating RN: [birth year] (42 y.o. Mare Ferrari Primary Care Corderius Saraceni: PCP, NO Other Clinician: Referring Joelys Staubs: Treating Becky Berberian/Extender: Yaakov Guthrie in Treatment: 0 Foot Assessment Items Site Locations + = Sensation present, - = Sensation absent, C = Callus, U = Ulcer R = Redness, W = Warmth, M = Maceration, PU = Pre-ulcerative lesion F = Fissure, S = Swelling, D = Dryness Assessment Right: Left: Other Deformity: No No Prior Foot Ulcer: No No Prior  Amputation: No No Charcot Joint: No No Ambulatory Status: GaitORMOND, Anthony [name] ([id number]) [id number] Page 4 of 4 Electronic Signature(s) Signed: 06/06/2022 5:00:09 PM By: Sharyn Creamer RN, BSN Entered By: Sharyn Creamer on 06/06/2022 01:74:94 -------------------------------------------------------------------------------- Nutrition Risk Screening Details Patient Name: Date of Service: Anthony Deeds RO N J. 06/06/2022 8:00 A M Medical Record Number: [id number] Patient Account Number: [id number] Date of Birth/Sex: Treating RN: [birth year] (42 y.o. Mare Ferrari Primary Care Tinlee Navarrette: PCP, NO Other Clinician: Referring Hortense Cantrall: Treating Alexzandria Massman/Extender: Yaakov Guthrie in Treatment: 0 Height (in): 68 Weight (lbs): 574 Body Mass Index (BMI): 87.3 Nutrition Risk Screening Items Score Screening NUTRITION RISK SCREEN: I have an illness or condition that made me change the kind and/or amount of food I eat 0 No I eat fewer than two meals per day 0 No I eat few fruits and vegetables, or milk products 0 No I have three or more drinks of beer, liquor or wine almost every day 0 No I have tooth or mouth problems that make it hard for me to eat 0 No I don't always have enough money to buy the food I need 0 No I eat alone most of the time 0 No I take three or more different prescribed or over-the-counter drugs a day 1 Yes Without wanting to, I have lost or gained 10 pounds in the last six months 0 No I am not always physically able to shop, cook and/or feed myself 0 No Nutrition Protocols Good Risk Protocol Moderate Risk Protocol High Risk Proctocol Risk Level: Good Risk Score: 1 Electronic Signature(s) Signed: 06/06/2022 5:00:09 PM By: Sharyn Creamer RN, BSN Entered By: Sharyn Creamer on 06/06/2022 08:27:02

## 2022-06-06 NOTE — Progress Notes (Signed)
Anthony [name], Anthony [name] ([id number]) [id number] Page 1 of 11 Visit Report for 06/06/2022 Allergy List Details Patient Name: Date of Service: Anthony [name] 06/06/2022 8:00 A M Medical Record Number: [id number] Patient Account Number: [id number] Date of Birth/Sex: Treating RN: 06-19-1980 (42 y.o. Anthony [name] Primary Care Anthony [name]: PCP, Anthony [name] Other Clinician: Referring Anthony [name]: Treating Anthony [name]/Extender: Anthony [name] in Treatment: 0 Allergies Active Allergies Xarelto Severity: Severe Active: 03/03/2017 Allergy Notes Electronic Signature(s) Signed: 06/06/2022 5:00:09 PM By: Anthony Creamer RN, BSN Entered By: Anthony [name] on 06/06/2022 08:20:34 -------------------------------------------------------------------------------- Arrival Information Details Patient Name: Date of Service: Anthony Deeds RO N J. 06/06/2022 8:00 A M Medical Record Number: [id number] Patient Account Number: [id number] Date of Birth/Sex: Treating RN: 1980-08-06 (42 y.o. Anthony [name] Primary Care Anthony [name]: PCP, Anthony [name] Other Clinician: Referring Anthony [name]: Treating Anthony [name]/Extender: Anthony [name] in Treatment: 0 Visit Information Patient Arrived: Ambulatory Arrival Time: 08:12 Accompanied By: self Transfer Assistance: None History Since Last Visit Any new allergies or adverse reactions: Anthony [name] Had a fall or experienced change in activities of daily living that may affect risk of falls: Anthony [name] Signs or symptoms of abuse/neglect since last visito Anthony [name] Hospitalized since last visit: Anthony [name] Implantable device outside of the clinic excluding cellular tissue based products placed in the center since last visit: Anthony [name] Has Dressing in Place as Prescribed: Anthony [name] Pain Present Now: Yes Electronic Signature(s) Signed: 06/06/2022 5:00:09 PM By: Anthony Creamer RN, BSN Entered By: Anthony [name] on 06/06/2022 08:19:50 Anthony [name] ([id number]) [id number] Page 2  of 11 -------------------------------------------------------------------------------- Clinic Level of Care Assessment Details Patient Name: Date of Service: Anthony [name], Anthony [name] 06/06/2022 8:00 A M Medical Record Number: [id number] Patient Account Number: [id number] Date of Birth/Sex: Treating RN: 1981/04/19 (42 y.o. Anthony [name] Primary Care Anthony [name]: PCP, Anthony [name] Other Clinician: Referring Anthony [name]: Treating Anthony [name]/Extender: Anthony [name] in Treatment: 0 Clinic Level of Care Assessment Items TOOL 4 Quantity Score X- 1 0 Use when only an EandM is performed on FOLLOW-UP visit ASSESSMENTS - Nursing Assessment / Reassessment X- 1 10 Reassessment of Co-morbidities (includes updates in patient status) X- 1 5 Reassessment of Adherence to Treatment Plan ASSESSMENTS - Wound and Skin A ssessment / Reassessment []  - 0 Simple Wound Assessment / Reassessment - one wound X- 2 5 Complex Wound Assessment / Reassessment - multiple wounds []  - 0 Dermatologic / Skin Assessment (not related to wound area) ASSESSMENTS - Focused Assessment []  - 0 Circumferential Edema Measurements - multi extremities []  - 0 Nutritional Assessment / Counseling / Intervention X- 1 5 Lower Extremity Assessment (monofilament, tuning fork, pulses) []  - 0 Peripheral Arterial Disease Assessment (using hand held doppler) ASSESSMENTS - Ostomy and/or Continence Assessment and Care []  - 0 Incontinence Assessment and Management []  - 0 Ostomy Care Assessment and Management (repouching, etc.) PROCESS - Coordination of Care X - Simple Patient / Family Education for ongoing care 1 15 []  - 0 Complex (extensive) Patient / Family Education for ongoing care X- 1 10 Staff obtains Programmer, systems, Records, T Results / Process Orders est []  - 0 Staff telephones HHA, Nursing Homes / Clarify orders / etc []  - 0 Routine Transfer to another Facility (non-emergent condition) []  - 0 Routine Hospital Admission (non-emergent  condition) X- 1 15 New Admissions / Biomedical engineer / Ordering NPWT Apligraf, etc. , []  - 0 Emergency Hospital Admission (emergent condition) []  - 0 Simple Discharge Coordination []  - 0 Complex (extensive) Discharge Coordination PROCESS - Special Needs []  - 0 Pediatric /  Minor Patient Management []  - 0 Isolation Patient Management []  - 0 Hearing / Language / Visual special needs []  - 0 Assessment of Community assistance (transportation, D/C planning, etc.) []  - 0 Additional assistance / Altered mentation []  - 0 Support Surface(s) Assessment (bed, cushion, seat, etc.) INTERVENTIONS - Wound Cleansing / Measurement Anthony [name], Anthony [name] ( ) [id number] Page 3 of 11 []  - 0 Simple Wound Cleansing - one wound X- 2 5 Complex Wound Cleansing - multiple wounds X- 1 5 Wound Imaging (photographs - any number of wounds) []  - 0 Wound Tracing (instead of photographs) []  - 0 Simple Wound Measurement - one wound X- 2 5 Complex Wound Measurement - multiple wounds INTERVENTIONS - Wound Dressings []  - 0 Small Wound Dressing one or multiple wounds []  - 0 Medium Wound Dressing one or multiple wounds X- 1 20 Large Wound Dressing one or multiple wounds X- 1 5 Application of Medications - topical []  - 0 Application of Medications - injection INTERVENTIONS - Miscellaneous []  - 0 External ear exam []  - 0 Specimen Collection (cultures, biopsies, blood, body fluids, etc.) []  - 0 Specimen(s) / Culture(s) sent or taken to Lab for analysis []  - 0 Patient Transfer (multiple staff / / Similar devices) []  - 0 Simple Staple / Suture removal (25 or less) []  - 0 Complex Staple / Suture removal (26 or more) []  - 0 Hypo / Hyperglycemic Management (close monitor of Blood Glucose) X- 1 15 Ankle / Brachial Index (ABI) - do not check if billed separately X- 1 5 Vital Signs Has the patient been seen at the hospital within the last three years:  Yes Total Score: 140 Level Of Care: New/Established - Level 4 Electronic Signature(s) Signed: 06/06/2022 5:00:09 PM By: RN, BSN Entered By: Anthony [name] on 06/06/2022 11:15:18 -------------------------------------------------------------------------------- Compression Therapy Details Patient Name: Date of Service: RO N J. 06/06/2022 8:00 A M Medical Record Number: Patient Account Number: Date of Birth/Sex: Treating RN: February 17, 1981 (41 y.o. Primary Care Masha Orbach: PCP, Anthony [name] Other Clinician: Referring Jady Braggs: Treating Ziah Turvey/Extender: in Treatment: 0 Compression Therapy Performed for Wound Assessment: Wound #2 Right,Proximal,Medial Lower Leg Performed By: Clinician , RN Compression Type: Three Layer Post Procedure Diagnosis Same as Pre-procedure Electronic Signature(s) Signed: 06/06/2022 5:00:09 PM By: RN, BSN Entered By: Nurse, adult on 06/06/2022 11:11:06 ( ) [phone].pdf Page 4 of 11 -------------------------------------------------------------------------------- Compression Therapy Details Patient Name: Date of Service: Anthony [name], Anthony [name] 06/06/2022 8:00 A M Medical Record Number: 06/08/2022 Patient Account Number: Anthony Reeks Date of Birth/Sex: Treating RN: 04/04/1981 (41 y.o. [id number] Primary Care Audrick Lamoureaux: PCP, Anthony [name] Other Clinician: Referring Amadeus Oyama: Treating Aemon Koeller/Extender: [id number] in Treatment: 0 Compression Therapy Performed for Wound Assessment: Wound #3 Right,Distal,Medial Lower Leg Performed By: Clinician 02/15/1981, RN Compression Type: Three Layer Post Procedure Diagnosis Same as Pre-procedure Electronic Signature(s) Signed: 06/06/2022 5:00:09 PM By: Tilda Franco RN, BSN Entered By: Redmond Pulling on 06/06/2022  11:11:06 -------------------------------------------------------------------------------- Encounter Discharge Information Details Patient Name: Date of Service: Redmond Pulling RO N J. 06/06/2022 8:00 A M Medical Record Number: 06/08/2022 Patient Account Number: Anthony [name] Date of Birth/Sex: Treating RN: [birth year] (41 y.o. [id number] Primary Care Chinmay Squier: PCP, Anthony [name] Other Clinician: Referring Donja Tipping: Treating Izea Livolsi/Extender: Doree Albee in Treatment: 0 Encounter Discharge Information Items Discharge Condition: Stable Ambulatory Status: Ambulatory Discharge Destination: Home Transportation: Private Auto Accompanied By: self Schedule Follow-up Appointment: Yes  Clinical Summary of Care: Patient Declined Electronic Signature(s) Signed: 06/06/2022 5:00:09 PM By: Redmond Pulling RN, BSN Entered By: Redmond Pulling on 06/06/2022 11:15:56 -------------------------------------------------------------------------------- Lower Extremity Assessment Details Patient Name: Date of Service: Anthony Reeks RO N J. 06/06/2022 8:00 A M Medical Record Number: [id number] Patient Account Number: [id number] Date of Birth/Sex: Treating RN: 04/25/1981 (41 y.o. Cline Cools Primary Care Atalie Oros: PCP, Anthony [name] Other Clinician: Referring Mariacristina Aday: Treating Jazzma Neidhardt/Extender: Tilda Franco in Treatment: 0 Edema Assessment Left: [Left: Right] [Right: :] Assessed: [Left: Anthony [name]] [Right: Yes] [Left: Edema] [Right: :] Calf Left: Right: Point of Measurement: 32 cm From Medial Instep 63.5 cm Ankle Left: Right: Point of Measurement: 11 cm From Medial Instep 34 cm Knee To Floor Left: Right: From Medial Instep 40.5 cm Vascular Assessment Pulses: Dorsalis Pedis Palpable: [Right:Yes] Blood Pressure: Brachial: [Right:160] Ankle: [Right:Dorsalis Pedis: 165 1.03] Electronic Signature(s) Signed: 06/06/2022 5:00:09 PM By: Redmond Pulling RN, BSN Entered By: Redmond Pulling on 06/06/2022  08:31:59 -------------------------------------------------------------------------------- Multi Wound Chart Details Patient Name: Date of Service: Anthony Reeks RO N J. 06/06/2022 8:00 A M Medical Record Number: [id number] Patient Account Number: [id number] Date of Birth/Sex: Treating RN: 04/16/1981 (41 y.o. M) Primary Care Bernhard Koskinen: PCP, Anthony [name] Other Clinician: Referring Tniya Bowditch: Treating Kalise Fickett/Extender: Tilda Franco in Treatment: 0 Vital Signs Height(in): 68 Pulse(bpm): 72 Weight(lbs): 574 Blood Pressure(mmHg): 160/91 Body Mass Index(BMI): 87.3 Temperature(F): 97.7 Respiratory Rate(breaths/min): 18 [2:Photos:] [N/A:N/A] Right, Proximal, Medial Lower Leg Right, Distal, Medial Lower Leg N/A Wound Location: Gradually Appeared Gradually Appeared N/A Wounding Event: Venous Leg Ulcer Venous Leg Ulcer N/A Primary Etiology: Sleep Apnea, Hypertension, Sleep Apnea, Hypertension, N/A Comorbid History: Rheumatoid Arthritis, Osteoarthritis Rheumatoid Arthritis, Osteoarthritis 04/03/2022 04/03/2022 N/A Date Acquired: 0 0 N/A Weeks of Treatment: Open Open N/A Wound Status: Anthony [name], Anthony [name] ([id number]) [id number] Page 6 of 11 Anthony [name] Anthony [name] N/A Wound Recurrence: 0.9x0.5x0.1 0.7x0.3x0.1 N/A Measurements L x W x D (cm) 0.353 0.165 N/A A (cm) : rea 0.035 0.016 N/A Volume (cm) : Full Thickness Without Exposed Full Thickness Without Exposed N/A Classification: Support Structures Support Structures Medium Medium N/A Exudate Amount: Serosanguineous Serosanguineous N/A Exudate Type: red, brown red, brown N/A Exudate Color: Large (67-100%) Large (67-100%) N/A Granulation Amount: Red Red N/A Granulation Quality: Small (1-33%) Small (1-33%) N/A Necrotic Amount: Fat Layer (Subcutaneous Tissue): Yes Fat Layer (Subcutaneous Tissue): Yes N/A Exposed Structures: Fascia: Anthony [name] Fascia: Anthony [name] Tendon: Anthony [name] Tendon: Anthony [name] Muscle: Anthony [name] Muscle: Anthony [name] Joint: Anthony [name] Joint:  Anthony [name] Bone: Anthony [name] Bone: Anthony [name] None None N/A Epithelialization: Hemosiderin Staining: Yes Hemosiderin Staining: Yes N/A Periwound Skin Color: Treatment Notes Electronic Signature(s) Signed: 06/06/2022 9:25:39 AM By: Geralyn Corwin DO Entered By: Geralyn Corwin on 06/06/2022 09:14:33 -------------------------------------------------------------------------------- Multi-Disciplinary Care Plan Details Patient Name: Date of Service: Anthony Reeks RO N J. 06/06/2022 8:00 A M Medical Record Number: [id number] Patient Account Number: [id number] Date of Birth/Sex: Treating RN: 16-Oct-1980 (41 y.o. Cline Cools Primary Care Dekota Kirlin: PCP, Anthony [name] Other Clinician: Referring Paulette Rockford: Treating Michie Molnar/Extender: Tilda Franco in Treatment: 0 Active Inactive Venous Leg Ulcer Nursing Diagnoses: Actual venous Insuffiency (use after diagnosis is confirmed) Knowledge deficit related to disease process and management Goals: Patient will maintain optimal edema control Date Initiated: 06/06/2022 Target Resolution Date: 07/11/2022 Goal Status: Active Patient/caregiver will verbalize understanding of disease process and disease management Date Initiated: 06/06/2022 Target Resolution Date: 07/11/2022 Goal Status: Active Interventions: Assess peripheral edema status every visit. Compression as ordered Provide education on venous insufficiency Notes: Wound/Skin Impairment Nursing Diagnoses: Impaired tissue integrity Knowledge deficit related to  ulceration/compromised skin integrity Goals: Patient/caregiver will verbalize understanding of skin care regimen Anthony [name], Anthony [name] ([id number]) [id number] Page 7 of 11 Date Initiated: 06/06/2022 Target Resolution Date: 06/21/2022 Goal Status: Active Ulcer/skin breakdown will have a volume reduction of 30% by week 4 Date Initiated: 06/06/2022 Target Resolution Date: 07/04/2022 Goal Status: Active Interventions: Assess  patient/caregiver ability to obtain necessary supplies Assess patient/caregiver ability to perform ulcer/skin care regimen upon admission and as needed Assess ulceration(s) every visit Provide education on ulcer and skin care Notes: Electronic Signature(s) Signed: 06/06/2022 5:00:09 PM By: Anthony Creamer RN, BSN Entered By: Anthony [name] on 06/06/2022 11:12:32 -------------------------------------------------------------------------------- Pain Assessment Details Patient Name: Date of Service: Anthony Deeds RO N J. 06/06/2022 8:00 A M Medical Record Number: [id number] Patient Account Number: [id number] Date of Birth/Sex: Treating RN: 02/14/1981 (42 y.o. Anthony [name] Primary Care Halbert Jesson: PCP, Anthony [name] Other Clinician: Referring Zelta Enfield: Treating Mayleigh Tetrault/Extender: Anthony [name] in Treatment: 0 Active Problems Location of Pain Severity and Description of Pain Patient Has Paino Yes Site Locations Rate the pain. Current Pain Level: 2 Worst Pain Level: 5 Pain Management and Medication Current Pain Management: Rest: Yes Electronic Signature(s) Signed: 06/06/2022 5:00:09 PM By: Anthony Creamer RN, BSN Entered By: Anthony [name] on 06/06/2022 08:37:43 Anthony [name] ([id number]) [id number] Page 8 of 11 -------------------------------------------------------------------------------- Patient/Caregiver Education Details Patient Name: Date of Service: Anthony [name], [name] 1/25/2024andnbsp8:00 A M Medical Record Number: [id number] Patient Account Number: [id number] Date of Birth/Gender: Treating RN: 08-26-1980 (42 y.o. Anthony [name] Primary Care Physician: PCP, Anthony [name] Other Clinician: Referring Physician: Treating Physician/Extender: Anthony [name] in Treatment: 0 Education Assessment Education Provided To: Patient Education Topics Provided Wound/Skin Impairment: Methods: Explain/Verbal Responses: State content correctly Electronic  Signature(s) Signed: 06/06/2022 5:00:09 PM By: Anthony Creamer RN, BSN Entered By: Anthony [name] on 06/06/2022 11:12:41 -------------------------------------------------------------------------------- Wound Assessment Details Patient Name: Date of Service: Anthony Deeds RO N J. 06/06/2022 8:00 A M Medical Record Number: [id number] Patient Account Number: [id number] Date of Birth/Sex: Treating RN: [birth year] (42 y.o. Anthony [name] Primary Care Ziare Orrick: PCP, Anthony [name] Other Clinician: Referring Saharra Santo: Treating Jaylanie Boschee/Extender: Anthony [name] in Treatment: 0 Wound Status Wound Number: 2 Primary Etiology: Venous Leg Ulcer Wound Location: Right, Proximal, Medial Lower Leg Wound Status: Open Wounding Event: Gradually Appeared Comorbid Sleep Apnea, Hypertension, Rheumatoid Arthritis, History: Osteoarthritis Date Acquired: 04/03/2022 Weeks Of Treatment: 0 Clustered Wound: Anthony [name] Photos Wound Measurements Length: (cm) 0.9 Width: (cm) 0.5 Depth: (cm) 0.1 Area: (cm) 0.353 Volume: (cm) 0.035 % Reduction in Area: % Reduction in Volume: Epithelialization: None Tunneling: Anthony [name] Undermining: Anthony [name] Wound Description Anthony [name], Anthony [name] ([id number]) Classification: Full Thickness Without Exposed Support Structures Exudate Amount: Medium Exudate Type: Serosanguineous Exudate Color: red, brown [phone].pdf Page 9 of 11 Foul Odor After Cleansing: Anthony [name] Slough/Fibrino Yes Wound Bed Granulation Amount: Large (67-100%) Exposed Structure Granulation Quality: Red Fascia Exposed: Anthony [name] Necrotic Amount: Small (1-33%) Fat Layer (Subcutaneous Tissue) Exposed: Yes Necrotic Quality: Adherent Slough Tendon Exposed: Anthony [name] Muscle Exposed: Anthony [name] Joint Exposed: Anthony [name] Bone Exposed: Anthony [name] Periwound Skin Texture Texture Color Anthony [name] Abnormalities Noted: Anthony [name] Anthony [name] Abnormalities Noted: Anthony [name] Hemosiderin Staining: Yes Moisture Anthony [name] Abnormalities Noted: Anthony [name] Treatment Notes Wound #2 (Lower Leg) Wound  Laterality: Right, Medial, Proximal Cleanser Soap and Water Discharge Instruction: May shower and wash wound with dial antibacterial soap and water prior to dressing change. Peri-Wound Care Sween Lotion (Moisturizing lotion) Discharge Instruction: Apply moisturizing lotion as directed Topical Gentamicin Discharge Instruction: As directed by physician Mupirocin Ointment Discharge Instruction: Apply Mupirocin (Bactroban) as  instructed Primary Dressing Hydrofera Blue Ready Transfer Foam, 2.5x2.5 (in/in) Discharge Instruction: Apply directly to wound bed as directed Secondary Dressing ABD Pad, 5x9 Discharge Instruction: Apply over primary dressing as directed. Secured With Compression Wrap ThreePress (3 layer compression wrap) Discharge Instruction: Apply three layer compression as directed. Compression Stockings Add-Ons Electronic Signature(s) Signed: 06/06/2022 5:00:09 PM By: Redmond Pulling RN, BSN Entered By: Redmond Pulling on 06/06/2022 08:36:39 -------------------------------------------------------------------------------- Wound Assessment Details Patient Name: Date of Service: Anthony Reeks RO N J. 06/06/2022 8:00 A M Medical Record Number: [id number] Patient Account Number: [id number] Date of Birth/Sex: Treating RN: May 19, 1980 (41 y.o. Cline Cools Primary Care Erric Machnik: PCP, Anthony [name] Other Clinician: Shella [name] ([id number]) [id number] Page 10 of 11 Referring Giavonni Cizek: Treating Tayjon Halladay/Extender: Tilda Franco in Treatment: 0 Wound Status Wound Number: 3 Primary Etiology: Venous Leg Ulcer Wound Location: Right, Distal, Medial Lower Leg Wound Status: Open Wounding Event: Gradually Appeared Comorbid Sleep Apnea, Hypertension, Rheumatoid Arthritis, History: Osteoarthritis Date Acquired: 04/03/2022 Weeks Of Treatment: 0 Clustered Wound: Anthony [name] Photos Wound Measurements Length: (cm) 0.7 Width: (cm) 0.3 Depth: (cm) 0.1 Area: (cm)  0.165 Volume: (cm) 0.016 % Reduction in Area: % Reduction in Volume: Epithelialization: None Tunneling: Anthony [name] Undermining: Anthony [name] Wound Description Classification: Full Thickness Without Exposed Support Structures Exudate Amount: Medium Exudate Type: Serosanguineous Exudate Color: red, brown Foul Odor After Cleansing: Anthony [name] Slough/Fibrino Yes Wound Bed Granulation Amount: Large (67-100%) Exposed Structure Granulation Quality: Red Fascia Exposed: Anthony [name] Necrotic Amount: Small (1-33%) Fat Layer (Subcutaneous Tissue) Exposed: Yes Necrotic Quality: Adherent Slough Tendon Exposed: Anthony [name] Muscle Exposed: Anthony [name] Joint Exposed: Anthony [name] Bone Exposed: Anthony [name] Periwound Skin Texture Texture Color Anthony [name] Abnormalities Noted: Anthony [name] Anthony [name] Abnormalities Noted: Anthony [name] Hemosiderin Staining: Yes Moisture Anthony [name] Abnormalities Noted: Anthony [name] Treatment Notes Wound #3 (Lower Leg) Wound Laterality: Right, Medial, Distal Cleanser Soap and Water Discharge Instruction: May shower and wash wound with dial antibacterial soap and water prior to dressing change. Peri-Wound Care Sween Lotion (Moisturizing lotion) Discharge Instruction: Apply moisturizing lotion as directed Topical Gentamicin Discharge Instruction: As directed by physician Mupirocin Ointment Discharge Instruction: Apply Mupirocin (Bactroban) as instructed Primary Dressing Anthony [name], Anthony [name] ([id number]) [phone].pdf Page 11 of 11 Hydrofera Blue Ready Transfer Foam, 2.5x2.5 (in/in) Discharge Instruction: Apply directly to wound bed as directed Secondary Dressing ABD Pad, 5x9 Discharge Instruction: Apply over primary dressing as directed. Secured With Compression Wrap ThreePress (3 layer compression wrap) Discharge Instruction: Apply three layer compression as directed. Compression Stockings Add-Ons Electronic Signature(s) Signed: 06/06/2022 5:00:09 PM By: Redmond Pulling RN, BSN Entered By: Redmond Pulling on 06/06/2022  08:37:03 -------------------------------------------------------------------------------- Vitals Details Patient Name: Date of Service: Anthony Reeks RO N J. 06/06/2022 8:00 A M Medical Record Number: [id number] Patient Account Number: [id number] Date of Birth/Sex: Treating RN: 1980-08-27 (41 y.o. Cline Cools Primary Care Carrell Palmatier: PCP, Anthony [name] Other Clinician: Referring Roberth Berling: Treating Zyria Fiscus/Extender: Tilda Franco in Treatment: 0 Vital Signs Time Taken: 08:12 Temperature (F): 97.7 Height (in): 68 Pulse (bpm): 72 Source: Stated Respiratory Rate (breaths/min): 18 Weight (lbs): 574 Blood Pressure (mmHg): 160/91 Source: Stated Reference Range: 80 - 120 mg / dl Body Mass Index (BMI): 87.3 Electronic Signature(s) Signed: 06/06/2022 5:00:09 PM By: Redmond Pulling RN, BSN Entered By: Redmond Pulling on 06/06/2022 16:38:46

## 2022-06-12 NOTE — Progress Notes (Signed)
Anthony [name], Anthony [name] ([id number]) [id number] Page 1 of 9 Visit Report for 06/06/2022 Chief Complaint Document Details Patient Name: Date of Service: Anthony [name] 06/06/2022 8:00 A M Medical Record Number: [id number] Patient Account Number: [id number] Date of Birth/Sex: Treating RN: Sep 21, 1980 (42 y.o. M) Primary Care Provider: PCP, NO Other Clinician: Referring Provider: Treating Provider/Extender: Anthony [name] in Treatment: 0 Information Obtained from: Patient Chief Complaint Right lower extremity wound Electronic Signature(s) Signed: 06/06/2022 9:25:39 AM By: Kalman Shan DO Entered By: Kalman Shan on 06/06/2022 09:16:31 -------------------------------------------------------------------------------- HPI Details Patient Name: Date of Service: Anthony [name] Anthony N J. 06/06/2022 8:00 A M Medical Record Number: [id number] Patient Account Number: [id number] Date of Birth/Sex: Treating RN: 04-Aug-1980 (42 y.o. M) Primary Care Provider: PCP, NO Other Clinician: Referring Provider: Treating Provider/Extender: Anthony [name] in Treatment: 0 History of Present Illness HPI Description: Admission 07/31/2021 Anthony [name] is a 42 year old male with a past medical history of sarcoidosis, obstructive sleep apnea and morbid obesity that presents to the clinic for a 1 year history of wounds to his right lower extremity. He states these wax and wane in size. He states that it currently does not drain but has weeped in the past. He reports taking amoxicillin last month and this helped improve the wound size and appearance. He does not have compression stockings. He currently denies signs of infection. 3/28; patient presents for follow-up. He tolerated the compression wrap well. He has no issues or complaints today. He denies signs of infection. 4/4; patient presents for follow-up. He has no issues or complaints 4/11; 1 week follow-up. The  patient appears to be [name] very well with the wound on the right medial lower leg. We are using Hydrofera Blue and 3 layer compression. The patient has sarcoidosis and is on chronic prednisone he is convinced that both of these have caused this wound although it certainly looks like this is chronic venous insufficiency with developing lymphedema.He has never worn compression stockings. 4/18; patient presents for follow-up. He has no issues or complaints today. He has not ordered compression stockings yet but states he will do this this week. 4/25; patient presents for follow-up. He has not ordered his compression stockings. He has the information to do so. He tolerated the compression wrap well. He has no issues or complaints today. 06/06/2022 Anthony [name] is a 42 year old male with a past medical history of venous insufficiency previously treated in our clinic last year for right lower extremity wound. He again presents with a similar wound that started spontaneously 2 months ago. He has been keeping the area covered. It sounds like he is never order the compression stockings that were recommended at last clinic admission. We gave him information today to elastic therapy to order these. Electronic Signature(s) Signed: 06/06/2022 9:25:39 AM By: Kalman Shan DO Entered By: Kalman Shan on 06/06/2022 09:17:48 Anthony [name] ([id number]) [id number] Page 2 of 9 -------------------------------------------------------------------------------- Physical Exam Details Patient Name: Date of Service: Anthony [name]. 06/06/2022 8:00 A M Medical Record Number: [id number] Patient Account Number: [id number] Date of Birth/Sex: Treating RN: Oct 16, 1980 (42 y.o. M) Primary Care Provider: PCP, NO Other Clinician: Referring Provider: Treating Provider/Extender: Anthony [name] in Treatment: 0 Constitutional respirations regular, non-labored and within target  range for patient.. Cardiovascular 2+ dorsalis pedis/posterior tibialis pulses. Psychiatric pleasant and cooperative. Notes Right lower extremity: T the medial aspect there are 2 open wounds with granulation tissue and nonviable tissue. Stage III lymphedema. o  Electronic Signature(s) Signed: 06/06/2022 9:25:39 AM By: Geralyn Corwin DO Entered By: Geralyn Corwin on 06/06/2022 09:19:31 -------------------------------------------------------------------------------- Physician Orders Details Patient Name: Date of Service: Anthony [name] Anthony N J. 06/06/2022 8:00 A M Medical Record Number: [id number] Patient Account Number: [id number] Date of Birth/Sex: Treating RN: [birth year] (41 y.o. 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 - Dr Mikey Bussing Return A Anesthetic Wound #2 Right,Proximal,Medial Lower Leg (In clinic) Topical Lidocaine 5% applied to wound bed Wound #3 Right,Distal,Medial Lower Leg (In clinic) Topical Lidocaine 5% applied to wound bed Bathing/ Shower/ Hygiene May shower with protection but do not get wound dressing(s) wet. Protect dressing(s) with water repellant cover (for example, large plastic bag) or a cast cover and may then take shower. - cast protector may be purchased from [facility], [facility] or [employer] Edema Control - Lymphedema / SCD / Anthony [name], Anthony [name] ([id number]) [id number] Page 3 of 9 Elevate legs to the level of the heart or above for 30 minutes daily and/or when sitting for  3-4 times a day throughout the day. Avoid standing for long periods of time. Exercise regularly Wound Treatment Wound #2 - Lower Leg Wound Laterality: Right, Medial, Proximal Cleanser: Soap and Water 1 x Per Week/15 Days Discharge Instructions: May shower and wash wound with dial antibacterial soap and water prior to dressing change. Peri-Wound Care: Sween Lotion (Moisturizing lotion) 1 x Per Week/15 Days Discharge Instructions: Apply moisturizing lotion as directed Topical: Gentamicin 1 x Per Week/15 Days Discharge Instructions: As directed by physician Topical: Mupirocin Ointment 1 x Per Week/15 Days Discharge Instructions: Apply Mupirocin (Bactroban) as instructed Prim Dressing: Hydrofera Blue Ready Transfer Foam, 2.5x2.5 (in/in) 1 x Per Week/15 Days ary Discharge Instructions: Apply directly to wound bed as directed Secondary Dressing: ABD Pad, 5x9 1 x Per Week/15 Days Discharge Instructions: Apply over primary dressing as directed. Compression Wrap: ThreePress (3 layer compression wrap) 1 x Per Week/15 Days Discharge Instructions: Apply three layer compression as directed. Wound #3 - Lower Leg Wound Laterality: Right, Medial, Distal Cleanser: Soap and Water 1 x Per Week/15 Days Discharge Instructions: May shower and wash wound with dial antibacterial soap and water prior to dressing change. Peri-Wound Care: Sween Lotion (Moisturizing lotion) 1 x Per Week/15 Days Discharge Instructions: Apply moisturizing lotion as directed Topical: Gentamicin 1 x Per Week/15 Days Discharge Instructions: As directed by physician Topical: Mupirocin Ointment 1 x Per Week/15 Days Discharge Instructions: Apply Mupirocin (Bactroban) as instructed Prim Dressing: Hydrofera Blue Ready Transfer Foam, 2.5x2.5 (in/in) 1 x Per Week/15 Days ary Discharge Instructions: Apply directly to wound bed as directed Secondary Dressing: ABD Pad, 5x9 1 x Per Week/15 Days Discharge Instructions: Apply over primary  dressing as directed. Compression Wrap: ThreePress (3 layer compression wrap) 1 x Per Week/15 Days Discharge Instructions: Apply three layer compression as directed. Patient Medications llergies: Xarelto A Notifications Medication Indication Start End prior to debridement 06/06/2022 lidocaine DOSE topical 5 % ointment - ointment topical once daily Electronic Signature(s) Signed: 06/06/2022 9:25:39 AM By: Geralyn Corwin DO Entered By: Geralyn Corwin on 06/06/2022 09:19:41 -------------------------------------------------------------------------------- Problem List Details Patient Name: Date of Service: Anthony [name], Anthony A  Anthony N J. 06/06/2022 8:00 A M Medical Record Number: [id number] Patient Account Number: [id number] Date of Birth/Sex: Treating RN: [birth year] (42 y.o. 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 06/06/2022 8:00 A M Medical Record Number: [id number] Patient Account Number: [id number] Date of Birth/Sex: Treating RN: 22-Sep-1980 (42 y.o. M) Primary Care Provider: PCP, NO Other Clinician: Referring Provider: Treating  Provider/Extender: Anthony [name] in Treatment: 0 Subjective Chief Complaint Information obtained from Patient Right lower extremity wound History of Present Illness (HPI) Admission 07/31/2021 Mr. Kaspian Muccio is a 42 year old male with a past medical history of sarcoidosis, obstructive sleep apnea and morbid obesity that presents to the clinic for a 1 year history of wounds to his right lower extremity. He states these wax and wane in size. He states that it currently does not drain but has weeped in the past. He reports taking amoxicillin last month and this helped improve the wound size and appearance. He does not have compression stockings. He currently denies signs of infection. 3/28; patient presents for follow-up. He tolerated the compression wrap well. He has no issues or complaints today. He denies signs of infection. 4/4; patient presents for follow-up. He has no issues or complaints 4/11; 1 week follow-up. The patient appears to be [name] very well with the wound on the right medial lower leg. We are using Hydrofera Blue and 3 layer compression. The patient has sarcoidosis and is on chronic prednisone he is convinced that both of these have caused this wound although it certainly looks like this is chronic venous insufficiency with developing lymphedema.He has never worn compression stockings. 4/18; patient presents for follow-up. He has no issues or complaints today. He has not ordered compression stockings yet but states he will do this this week. 4/25; patient presents for follow-up. He has not ordered his compression stockings. He has the information to do so. He tolerated the compression wrap well. He has no issues or complaints today. 06/06/2022 Anthony [name] is a 42 year old male with a past medical history of venous insufficiency previously treated in our clinic last year for right lower extremity wound. He again presents with a similar wound that started  spontaneously 2 months ago. He has been keeping the area covered. It sounds like he is never Anthony [name], Anthony [name] ([id number]) [id number] Page 5 of 9 order the compression stockings that were recommended at last clinic admission. We gave him information today to elastic therapy to order these. Patient History Information obtained from Patient. Allergies Xarelto (Severity: Severe) Family History Cancer - Maternal Grandparents, Diabetes - Father, Heart Disease - Father, Hypertension - Father, Stroke - Father, No family history of Hereditary Spherocytosis, Kidney Disease, Lung Disease, Seizures, Thyroid Problems, Tuberculosis. Social History Former smoker - Quit 2017, Marital Status - Married, Alcohol Use - Moderate, Drug Use - No History, Caffeine Use - Daily. Medical History Respiratory  Patient has history of Sleep Apnea Cardiovascular Patient has history of Hypertension Musculoskeletal Patient has history of Rheumatoid Arthritis, Osteoarthritis - knee Medical A Surgical History Notes nd Respiratory Sarcoidosis of lung Cardiovascular Mediastinal lymphadenopathy Immunological Sarcoidosis Review of Systems (ROS) Constitutional Symptoms (General Health) Denies complaints or symptoms of Fatigue, Fever, Chills, Marked Weight Change. Eyes Complains or has symptoms of Glasses / Contacts. Ear/Nose/Mouth/Throat Denies complaints or symptoms of Chronic sinus problems or rhinitis. Cardiovascular Denies complaints or symptoms of Chest pain. Gastrointestinal Denies complaints or symptoms of Frequent diarrhea, Nausea, Vomiting. Endocrine Denies complaints or symptoms of Heat/cold intolerance. Genitourinary Denies complaints or symptoms of Frequent urination. Integumentary (Skin) Complains or has symptoms of Wounds. Musculoskeletal Complains or has symptoms of Muscle Pain. Neurologic Denies complaints or symptoms of Numbness/parasthesias. Psychiatric Denies  complaints or symptoms of Claustrophobia. Objective Constitutional respirations regular, non-labored and within target range for patient.. Vitals Time Taken: 8:12 AM, Height: 68 in, Source: Stated, Weight: 574 lbs, Source: Stated, BMI: 87.3, Temperature: 97.7 F, Pulse: 72 bpm, Respiratory Rate: 18 breaths/min, Blood Pressure: 160/91 mmHg. Cardiovascular 2+ dorsalis pedis/posterior tibialis pulses. Psychiatric pleasant and cooperative. General Notes: Right lower extremity: T the medial aspect there are 2 open wounds with granulation tissue and nonviable tissue. Stage III lymphedema. o Integumentary (Hair, Skin) Wound #2 status is Open. Original cause of wound was Gradually Appeared. The date acquired was: 04/03/2022. The wound is located on the Right,Proximal,Medial Lower Leg. The wound measures 0.9cm length x 0.5cm width x 0.1cm depth; 0.353cm^2 area and 0.035cm^3 volume. There is Fat Layer (Subcutaneous Tissue) exposed. There is no tunneling or undermining noted. There is a medium amount of serosanguineous drainage noted. There is large (67- 100%) red granulation within the wound bed. There is a small (1-33%) amount of necrotic tissue within the wound bed including Adherent Slough. The periwound skin appearance exhibited: Hemosiderin Staining. Wound #3 status is Open. Original cause of wound was Gradually Appeared. The date acquired was: 04/03/2022. The wound is located on the Anthony [name], Anthony [name] ([id number]) [id number] Page 6 of 9 Right,Distal,Medial Lower Leg. The wound measures 0.7cm length x 0.3cm width x 0.1cm depth; 0.165cm^2 area and 0.016cm^3 volume. There is Fat Layer (Subcutaneous Tissue) exposed. There is no tunneling or undermining noted. There is a medium amount of serosanguineous drainage noted. There is large (67- 100%) red granulation within the wound bed. There is a small (1-33%) amount of necrotic tissue within the wound bed including Adherent  Slough. The periwound skin appearance exhibited: Hemosiderin Staining. 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 This started spontaneous. We discussed the importance of compression stockings daily to prevent future wounds. We again gave him information to order these. For now I recommended antibiotic ointment to address any bioburden and Hydrofera Blue under 3 layer compression. He knows to not get the wrap wet or keep this on for more than 7 days. Follow-up in 1 week. Plan Follow-up Appointments: Return Appointment in 1 week. - Dr Mikey Bussing Anesthetic: Wound #2 Right,Proximal,Medial Lower Leg: (In clinic) Topical Lidocaine 5% applied to wound bed Wound #3 Right,Distal,Medial Lower Leg: (In clinic) Topical Lidocaine 5% applied to wound bed Bathing/ Shower/ Hygiene: May shower with protection but do not get wound dressing(s) wet. Protect dressing(s) with  water repellant cover (for example, large plastic bag) or a cast cover and may then take shower. - cast protector may be purchased from [facility], [facility] or [employer] Edema Control - Lymphedema / SCD / Other: Elevate legs to the level of the heart or above for 30 minutes daily and/or when sitting for 3-4 times a day throughout the day. Avoid standing for long periods of time. Exercise regularly The following medication(s) was prescribed: lidocaine topical 5 % ointment ointment topical once daily for prior to debridement was prescribed at facility WOUND #2: - Lower Leg Wound Laterality: Right, Medial, Proximal Cleanser: Soap and Water 1 x Per Week/15 Days Discharge Instructions: May shower and wash wound with dial  antibacterial soap and water prior to dressing change. Peri-Wound Care: Sween Lotion (Moisturizing lotion) 1 x Per Week/15 Days Discharge Instructions: Apply moisturizing lotion as directed Topical: Gentamicin 1 x Per Week/15 Days Discharge Instructions: As directed by physician Topical: Mupirocin Ointment 1 x Per Week/15 Days Discharge Instructions: Apply Mupirocin (Bactroban) as instructed Prim Dressing: Hydrofera Blue Ready Transfer Foam, 2.5x2.5 (in/in) 1 x Per Week/15 Days ary Discharge Instructions: Apply directly to wound bed as directed Secondary Dressing: ABD Pad, 5x9 1 x Per Week/15 Days Discharge Instructions: Apply over primary dressing as directed. Com pression Wrap: ThreePress (3 layer compression wrap) 1 x Per Week/15 Days Discharge Instructions: Apply three layer compression as directed. WOUND #3: - Lower Leg Wound Laterality: Right, Medial, Distal Cleanser: Soap and Water 1 x Per Week/15 Days Discharge Instructions: May shower and wash wound with dial antibacterial soap and water prior to dressing change. Peri-Wound Care: Sween Lotion (Moisturizing lotion) 1 x Per Week/15 Days Discharge Instructions: Apply moisturizing lotion as directed Topical: Gentamicin 1 x Per Week/15 Days Discharge Instructions: As directed by physician Topical: Mupirocin Ointment 1 x Per Week/15 Days Discharge Instructions: Apply Mupirocin (Bactroban) as instructed Prim Dressing: Hydrofera Blue Ready Transfer Foam, 2.5x2.5 (in/in) 1 x Per Week/15 Days ary Discharge Instructions: Apply directly to wound bed as directed Secondary Dressing: ABD Pad, 5x9 1 x Per Week/15 Days Discharge Instructions: Apply over primary dressing as directed. Com pression Wrap: ThreePress (3 layer compression wrap) 1 x Per Week/15 Days Discharge Instructions: Apply three layer compression as directed. 1. Hydrofera Blue with antibiotic ointment under 3 layer compressionooright lower extremity 2. Follow-up in 1 week 3.  Information given to patient to order compression stockings Electronic Signature(s) Signed: 06/06/2022 9:25:39 AM By: Geralyn Corwin DO Entered By: Geralyn Corwin on 06/06/2022 09:22:46 Anthony [name] ([id number]) [id number] Page 7 of 9 -------------------------------------------------------------------------------- HxROS Details Patient Name: Date of Service: Anthony [name], GORBY 06/06/2022 8:00 A M Medical Record Number: [id number] Patient Account Number: [id number] Date of Birth/Sex: Treating RN: 1981-03-18 (41 y.o. Lucious Groves Primary Care Provider: PCP, NO Other Clinician: Referring Provider: Treating Provider/Extender: Anthony [name] in Treatment: 0 Information Obtained From Patient Constitutional Symptoms (General Health) Complaints and Symptoms: Negative for: Fatigue; Fever; Chills; Marked Weight Change Eyes Complaints and Symptoms: Positive for: Glasses / Contacts Ear/Nose/Mouth/Throat Complaints and Symptoms: Negative for: Chronic sinus problems or rhinitis Cardiovascular Complaints and Symptoms: Negative for: Chest pain Medical History: Positive for: Hypertension Past Medical History Notes: Mediastinal lymphadenopathy Gastrointestinal Complaints and Symptoms: Negative for: Frequent diarrhea; Nausea; Vomiting Endocrine Complaints and Symptoms: Negative for: Heat/cold intolerance Genitourinary Complaints and Symptoms: Negative for: Frequent urination Integumentary (Skin) Complaints and Symptoms: Positive for: Wounds Musculoskeletal Complaints and Symptoms: Positive for: Muscle Pain Medical History: Positive for: Rheumatoid Arthritis; Osteoarthritis - knee Neurologic  Complaints and Symptoms: Negative for: Numbness/parasthesias JORON, VELIS ([id number]) [phone].pdf Page 8 of 9 Psychiatric Complaints and Symptoms: Negative for:  Claustrophobia Hematologic/Lymphatic Respiratory Medical History: Positive for: Sleep Apnea Past Medical History Notes: Sarcoidosis of lung Immunological Medical History: Past Medical History Notes: Sarcoidosis Oncologic Immunizations Pneumococcal Vaccine: Received Pneumococcal Vaccination: No Implantable Devices None Family and Social History Cancer: Yes - Maternal Grandparents; Diabetes: Yes - Father; Heart Disease: Yes - Father; Hereditary Spherocytosis: No; Hypertension: Yes - Father; Kidney Disease: No; Lung Disease: No; Seizures: No; Stroke: Yes - Father; Thyroid Problems: No; Tuberculosis: No; Former smoker - Quit 2017; Marital Status - Married; Alcohol Use: Moderate; Drug Use: No History; Caffeine Use: Daily; Financial Concerns: No; Food, Clothing or Shelter Needs: No; Support System Lacking: No; Transportation Concerns: No Electronic Signature(s) Signed: 06/06/2022 9:25:39 AM By: Kalman Shan DO Signed: 06/06/2022 5:00:09 PM By: Sharyn Creamer RN, BSN Signed: 06/12/2022 8:39:24 AM By: Rhae Hammock RN Entered By: Sharyn Creamer on 06/06/2022 08:24:55 -------------------------------------------------------------------------------- SuperBill Details Patient Name: Date of Service: Sherryl Manges. 06/06/2022 Medical Record Number: [id number] Patient Account Number: [id number] Date of Birth/Sex: Treating RN: 09/19/1980 (42 y.o. 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 Facility Procedures : CPT4 Code: [id number] Description: 99214 - WOUND CARE VISIT-LEV 4 EST PT Modifier: 25 Quantity:  1 Physician Procedures : CPT4 Code Description Modifier BERTHEL, BAGNALL ([id number]) [id number] - WC PHYS LEVEL 4 - EST PT ICD-10 Diagnosis Description I87.311 Chronic venous hypertension (idiopathic) with ulcer of right lower extremity  L97.812 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

## 2022-06-14 ENCOUNTER — Encounter (HOSPITAL_BASED_OUTPATIENT_CLINIC_OR_DEPARTMENT_OTHER): Payer: Commercial Managed Care - HMO | Attending: Internal Medicine | Admitting: Internal Medicine

## 2022-06-14 DIAGNOSIS — M069 Rheumatoid arthritis, unspecified: Secondary | ICD-10-CM | POA: Diagnosis not present

## 2022-06-14 DIAGNOSIS — D862 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: Secondary | ICD-10-CM

## 2022-06-14 DIAGNOSIS — I872 Venous insufficiency (chronic) (peripheral): Secondary | ICD-10-CM | POA: Diagnosis not present

## 2022-06-14 DIAGNOSIS — I87311 Chronic venous hypertension (idiopathic) with ulcer of right lower extremity: Secondary | ICD-10-CM | POA: Diagnosis present

## 2022-06-14 DIAGNOSIS — I10 Essential (primary) hypertension: Secondary | ICD-10-CM | POA: Diagnosis not present

## 2022-06-14 DIAGNOSIS — I890 Lymphedema, not elsewhere classified: Secondary | ICD-10-CM | POA: Insufficient documentation

## 2022-06-14 DIAGNOSIS — G4733 Obstructive sleep apnea (adult) (pediatric): Secondary | ICD-10-CM | POA: Diagnosis not present

## 2022-06-14 DIAGNOSIS — L97812 Non-pressure chronic ulcer of other part of right lower leg with fat layer exposed: Secondary | ICD-10-CM | POA: Insufficient documentation

## 2022-06-15 NOTE — Progress Notes (Addendum)
SENECA, Anthony [name] ([id number]) [id number] Page 1 of 9 Visit Report for 06/14/2022 Chief Complaint Document Details Patient Name: Date of Service: Anthony [name], Anthony [name] 06/14/2022 8:45 A M Medical Record Number: [id number] Patient Account Number: [id number] Date of Birth/Sex: Treating RN: 05/26/1980 (42 y.o. M) Primary Care Provider: PCP, NO Other Clinician: Referring Provider: Treating Provider/Extender: Yaakov Guthrie in Treatment: 1 Information Obtained from: Patient Chief Complaint Right lower extremity wound Electronic Signature(s) Signed: 06/14/2022 11:56:15 AM By: Kalman Shan DO Entered By: Kalman Shan on 06/14/2022 09:39:32 -------------------------------------------------------------------------------- Debridement Details Patient Name: Date of Service: Anthony Deeds RO N J. 06/14/2022 8:45 A M Medical Record Number: [id number] Patient Account Number: [id number] Date of Birth/Sex: Treating RN: March 02, 1981 (42 y.o. Hessie Diener Primary Care Provider: PCP, NO Other Clinician: Referring Provider: Treating Provider/Extender: Yaakov Guthrie in Treatment: 1 Debridement Performed for Assessment: Wound #2 Right,Proximal,Medial Lower Leg Performed By: Clinician Deon Pilling, RN Debridement Type: Chemical/Enzymatic/Mechanical Agent Used: Santyl Severity of Tissue Pre Debridement: Fat layer exposed Level of Consciousness (Pre-procedure): Awake and Alert Pre-procedure Verification/Time Out No Taken: Bleeding: None Response to Treatment: Procedure was tolerated well Level of Consciousness (Post- Awake and Alert procedure): Post Debridement Measurements of Total Wound Length: (cm) 0.4 Width: (cm) 0.5 Depth: (cm) 0.1 Volume: (cm) 0.016 Character of Wound/Ulcer Post Debridement: Requires Further Debridement Severity of Tissue Post Debridement: Fat layer exposed Post Procedure Diagnosis Same as Pre-procedure Electronic  Signature(s) Signed: 06/14/2022 11:56:15 AM By: Kalman Shan DO Signed: 06/14/2022 6:10:45 PM By: Deon Pilling RN, BSN Entered By: Deon Pilling on 06/14/2022 09:28:52 [name] ([id number]) [id number] Page 2 of 9 -------------------------------------------------------------------------------- Debridement Details Patient Name: Date of Service: CIMARRON, PUFAHL 06/14/2022 8:45 A M Medical Record Number: [id number] Patient Account Number: [id number] Date of Birth/Sex: Treating RN: 03-Jan-1981 (42 y.o. Hessie Diener Primary Care Provider: PCP, NO Other Clinician: Referring Provider: Treating Provider/Extender: Yaakov Guthrie in Treatment: 1 Debridement Performed for Assessment: Wound #3 Right,Distal,Medial Lower Leg Performed By: Clinician Deon Pilling, RN Debridement Type: Chemical/Enzymatic/Mechanical Agent Used: Santyl Severity of Tissue Pre Debridement: Fat layer exposed Level of Consciousness (Pre-procedure): Awake and Alert Pre-procedure Verification/Time Out No Taken: Bleeding: None Response to Treatment: Procedure was tolerated well Level of Consciousness (Post- Awake and Alert procedure): Post Debridement Measurements of Total Wound Length: (cm) 0.2 Width: (cm) 0.3 Depth: (cm) 0.1 Volume: (cm) 0.005 Character of Wound/Ulcer Post Debridement: Requires Further Debridement Severity of Tissue Post Debridement: Fat layer exposed Post Procedure Diagnosis Same as Pre-procedure Electronic Signature(s) Signed: 06/14/2022 11:56:15 AM By: Kalman Shan DO Signed: 06/14/2022 6:10:45 PM By: Deon Pilling RN, BSN Entered By: Deon Pilling on 06/14/2022 09:29:16 -------------------------------------------------------------------------------- HPI Details Patient Name: Date of Service: Anthony Deeds RO N J. 06/14/2022 8:45 A M Medical Record Number: [id number] Patient Account Number: [id number] Date of Birth/Sex: Treating RN: Mar 16, 1981  (42 y.o. M) Primary Care Provider: PCP, NO Other Clinician: Referring Provider: Treating Provider/Extender: Yaakov Guthrie in Treatment: 1 History of Present Illness HPI Description: Admission 07/31/2021 [name] is a 42 year old male with a past medical history of sarcoidosis, obstructive sleep apnea and morbid obesity that presents to the clinic for a 1 year history of wounds to his right lower extremity. He states these wax and wane in size. He states that it currently does not drain but has weeped in the past. He reports taking amoxicillin last month and this helped improve the wound size and appearance. He does not have compression stockings.  He currently denies signs of infection. 3/28; patient presents for follow-up. He tolerated the compression wrap well. He has no issues or complaints today. He denies signs of infection. 4/4; patient presents for follow-up. He has no issues or complaints 4/11; 1 week follow-up. The patient appears to be doing very well with the wound on the right medial lower leg. We are using [facility] and 3 layer BURACH, BRAMSON ([id number]) [id number] Page 3 of 9 compression. The patient has sarcoidosis and is on chronic prednisone he is convinced that both of these have caused this wound although it certainly looks like this is chronic venous insufficiency with developing lymphedema.He has never worn compression stockings. 4/18; patient presents for follow-up. He has no issues or complaints today. He has not ordered compression stockings yet but states he will do this this week. 4/25; patient presents for follow-up. He has not ordered his compression stockings. He has the information to do so. He tolerated the compression wrap well. He has no issues or complaints today. 06/06/2022 [name] is a 42 year old male with a past medical history of venous insufficiency previously treated in our clinic last year  for right lower extremity wound. He again presents with a similar wound that started spontaneously 2 months ago. He has been keeping the area covered. It sounds like he is never order the compression stockings that were recommended at last clinic admission. We gave him information today to elastic therapy to order these. 2/2; patient presents for follow-up. We were using antibiotic ointment with Hydrofera Blue under 3 layer compression. Unfortunately he states that the wrap slid down 2 days after it was placed. He did not let us know. He has been keeping the area covered. Electronic Signature(s) Signed: 06/14/2022 11:56:15 AM By: Kalman Shan DO Entered By: Kalman Shan on 06/14/2022 09:40:17 -------------------------------------------------------------------------------- Physical Exam Details Patient Name: Date of Service: Anthony Deeds RO N J. 06/14/2022 8:45 A M Medical Record Number: [id number] Patient Account Number: [id number] Date of Birth/Sex: Treating RN: [birth year] (42 y.o. M) Primary Care Provider: PCP, NO Other Clinician: Referring Provider: Treating Provider/Extender: Yaakov Guthrie in Treatment: 1 Constitutional respirations regular, non-labored and within target range for patient.. Cardiovascular 2+ dorsalis pedis/posterior tibialis pulses. Psychiatric pleasant and cooperative. Notes Right lower extremity: T the medial aspect there are 2 open wounds with granulation tissue and nonviable tissue. Stage III lymphedema. o Electronic Signature(s) Signed: 06/14/2022 11:56:15 AM By: Kalman Shan DO Entered By: Kalman Shan on 06/14/2022 09:40:39 -------------------------------------------------------------------------------- Physician Orders Details Patient Name: Date of Service: Anthony Deeds RO N J. 06/14/2022 8:45 A M Medical Record Number: [id number] Patient Account Number: [id number] Date of Birth/Sex: Treating RN: [birth year] (42 y.o. 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 - Dr Heber Celina 06/20/2022 1230 overflow room 7 Thursday Return A ppointment in 2 weeks. - Dr. Heber Palo 06/28/2022 0930 Friday Room 8 Return A Anesthetic Wound #2 Right,Proximal,Medial Lower Leg (In clinic) Topical Lidocaine 5% applied to wound bed Wound #3 Right,Distal,Medial Lower Leg (In clinic) Topical Lidocaine 5% applied to wound bed Bathing/ Shower/  Hygiene May shower with protection but do not get wound dressing(s) wet. Protect dressing(s) with water repellant cover (for example, large plastic bag) or a cast cover and may then take shower. - cast protector may be purchased from [facility], [facility] or [employer] Edema Control - Lymphedema / SCD / Other Elevate legs to the level of the heart or above for 30 minutes daily and/or when sitting for 3-4 times a day throughout the day. Avoid standing for long periods of time. Exercise regularly Wound Treatment Wound #2 - Lower Leg Wound Laterality: Right, Medial, Proximal Cleanser: Soap and Water 1 x Per Week/15 Days Discharge Instructions: May shower and wash wound with dial antibacterial soap and water prior to dressing change. Peri-Wound Care: Sween Lotion (Moisturizing lotion) 1 x Per Week/15 Days Discharge Instructions: Apply moisturizing lotion as directed Topical: zinc at the top of lower portion of leg to secure wrap in place. 1 x Per Week/15 Days Discharge Instructions:  see product. Prim Dressing: Hydrofera Blue Ready Transfer Foam, 2.5x2.5 (in/in) 1 x Per Week/15 Days ary Discharge Instructions: Apply directly to wound bed as directed Prim Dressing: Santyl Ointment 1 x Per Week/15 Days ary Discharge Instructions: Apply nickel thick amount to wound bed as instructed Secondary Dressing: Woven Gauze Sponge, Non-Sterile 4x4 in 1 x Per Week/15 Days Discharge Instructions: Apply over primary dressing as directed. Compression Wrap: FourPress (4 layer compression wrap) 1 x Per Week/15 Days Discharge Instructions: Apply four layer compression as directed. May also use URGO K2. Wound #3 - Lower Leg Wound Laterality: Right, Medial, Distal Cleanser: Soap and Water 1 x Per Week/15 Days Discharge Instructions: May shower and wash wound with dial antibacterial soap and water prior to dressing change. Peri-Wound Care: Sween Lotion (Moisturizing lotion) 1 x Per Week/15 Days Discharge Instructions: Apply moisturizing lotion as directed Topical: zinc at the top of lower portion of leg to secure wrap in place. 1 x Per Week/15 Days Discharge Instructions: see product. Prim Dressing: Hydrofera Blue Ready Transfer Foam, 2.5x2.5 (in/in) 1 x Per Week/15 Days ary Discharge Instructions: Apply directly to wound bed as directed Prim Dressing: Santyl Ointment 1 x Per Week/15 Days ary Discharge Instructions: Apply nickel thick amount to wound bed as instructed Secondary Dressing: Woven Gauze Sponge, Non-Sterile 4x4 in 1 x Per Week/15 Days Discharge Instructions: Apply over primary dressing as directed. Compression Wrap: FourPress (4 layer compression wrap) 1 x Per Week/15 Days Discharge Instructions: Apply four layer compression as directed. May also use URGO K2. Electronic Signature(s) Signed: 06/14/2022 11:56:15 AM By: Kalman Shan DO Signed: 06/14/2022 6:10:45 PM By: Deon Pilling RN, BSN Entered By: Deon Pilling on 06/14/2022 09:51:50 [name] ([id number])  [id number] Page 5 of 9 -------------------------------------------------------------------------------- Problem List Details Patient Name: Date of Service: JOANGEL, SMATHERS 06/14/2022 8:45 A M Medical Record Number: [id number] Patient Account Number: [id number] Date of Birth/Sex: Treating RN: 08-Jan-1981 (42 y.o. 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 M) Primary Care Provider: PCP, NO Other Clinician: Referring Provider: Treating Provider/Extender: Yaakov Guthrie in Treatment: 1 Subjective Chief Complaint Information obtained from Patient Right lower extremity wound History of Present Illness (HPI) Admission 07/31/2021 Mr. Brycen Salsedo is a 42 year old male with a past medical history of sarcoidosis, obstructive sleep apnea and morbid obesity that presents to the clinic for a 1 year history of wounds to his right lower extremity. He states these wax and wane in size. He states that it currently does not drain but has weeped  in the past. He reports taking amoxicillin last month and this helped improve the wound size and appearance. He does not have compression stockings. He currently denies signs of infection. 3/28; patient presents for follow-up. He tolerated the compression wrap well. He has no issues or complaints today. He denies signs of infection. 4/4; patient presents for follow-up. He has no issues or complaints KAMERIN, MANWILLER ([id number]) [id number] Page 6 of 9 4/11; 1 week follow-up. The patient appears to be doing very well with the wound on the right medial lower leg. We are using Hydrofera Blue and 3 layer compression. The patient has sarcoidosis and is on chronic prednisone he is convinced that both of these have caused this wound although it certainly looks like this is chronic venous insufficiency with developing lymphedema.He has never worn compression stockings. 4/18; patient presents for follow-up. He has no issues or complaints today. He has not ordered compression stockings yet but states he will do this this week. 4/25; patient presents for follow-up. He has not ordered his compression stockings. He has the information to do so. He tolerated the compression wrap well. He has no issues or complaints today. 06/06/2022 Mr. Keionte Pflanz is a 42 year old male with a past medical history of venous insufficiency previously treated in our clinic last year for right lower extremity wound. He again presents with a similar wound that started spontaneously 2 months ago. He has been keeping the area covered. It sounds like he is never order the compression stockings that were recommended at last clinic admission. We gave him information today to elastic therapy to order these. 2/2; patient presents for follow-up. We were using antibiotic ointment with Hydrofera Blue under 3 layer compression. Unfortunately he states that the wrap slid down 2 days after it was placed. He did not  let us know. He has been keeping the area covered. Patient History Information obtained from Patient. Family History Cancer - Maternal Grandparents, Diabetes - Father, Heart Disease - Father, Hypertension - Father, Stroke - Father, No family history of Hereditary Spherocytosis, Kidney Disease, Lung Disease, Seizures, Thyroid Problems, Tuberculosis. Social History Former smoker - Quit 2017, Marital Status - Married, Alcohol Use - Moderate, Drug Use - No History, Caffeine Use - Daily. Medical History Respiratory Patient has history of Sleep Apnea Cardiovascular Patient has history of Hypertension Musculoskeletal Patient has history of Rheumatoid Arthritis, Osteoarthritis - knee Medical A Surgical History Notes nd Respiratory Sarcoidosis of lung Cardiovascular Mediastinal lymphadenopathy Immunological Sarcoidosis Objective Constitutional respirations regular, non-labored and within target range for patient.. Vitals Time Taken: 9:00 AM, Height: 68 in, Weight: 574 lbs, BMI: 87.3, Temperature: 99 F, Pulse: 77 bpm, Respiratory Rate: 20 breaths/min, Blood Pressure: 154/82 mmHg. Cardiovascular 2+ dorsalis pedis/posterior tibialis pulses. Psychiatric pleasant and cooperative. General Notes: Right lower extremity: T the medial  aspect there are 2 open wounds with granulation tissue and nonviable tissue. Stage III lymphedema. o Integumentary (Hair, Skin) Wound #2 status is Open. Original cause of wound was Gradually Appeared. The date acquired was: 04/03/2022. The wound has been in treatment 1 weeks. The wound is located on the Right,Proximal,Medial Lower Leg. The wound measures 0.4cm length x 0.5cm width x 0.1cm depth; 0.157cm^2 area and 0.016cm^3 volume. There is Fat Layer (Subcutaneous Tissue) exposed. There is no tunneling or undermining noted. There is a medium amount of serosanguineous drainage noted. The wound margin is distinct with the outline attached to the wound base. There is  medium (34-66%) red granulation within the wound bed. There is a medium (34-66%) amount of necrotic tissue within the wound bed including Adherent Slough. The periwound skin appearance exhibited: Hemosiderin Staining. The periwound skin appearance did not exhibit: Callus, Crepitus, Excoriation, Induration, Rash, Scarring, Dry/Scaly, Maceration, Atrophie Blanche, Cyanosis, Ecchymosis, Mottled, Pallor, Rubor, Erythema. Wound #3 status is Open. Original cause of wound was Gradually Appeared. The date acquired was: 04/03/2022. The wound has been in treatment 1 weeks. The wound is located on the Right,Distal,Medial Lower Leg. The wound measures 0.2cm length x 0.3cm width x 0.1cm depth; 0.047cm^2 area and 0.005cm^3 volume. There is Fat Layer (Subcutaneous Tissue) exposed. There is no tunneling or undermining noted. There is a medium amount of serosanguineous drainage noted. The wound margin is distinct with the outline attached to the wound base. There is small (1-33%) red granulation within the wound bed. There is a large (67-100%) amount of necrotic tissue within the wound bed including Adherent Slough. The periwound skin appearance exhibited: Hemosiderin Staining. The periwound skin appearance did not exhibit: Callus, Crepitus, Excoriation, Induration, Rash, Scarring, Dry/Scaly, Maceration, Atrophie Blanche, Cyanosis, Ecchymosis, Mottled, Pallor, Rubor, Erythema. 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 I recommended Hydrofera Blue and Santyl under 4-layer compression with Unna boot layer at the top to help keep this in place. I recommended he call the office if the wrap were to slide down  and we would try and get him an appointment to be rewrapped. Procedures Wound #2 Pre-procedure diagnosis of Wound #2 is a Venous Leg Ulcer located on the Right,Proximal,Medial Lower Leg .Severity of Tissue Pre Debridement is: Fat layer exposed. There was a Chemical/Enzymatic/Mechanical debridement performed by Deon Pilling, RN.Marland Kitchen Agent used was [employer]. There was no bleeding. The procedure was tolerated well. Post Debridement Measurements: 0.4cm length x 0.5cm width x 0.1cm depth; 0.016cm^3 volume. Character of Wound/Ulcer Post Debridement requires further debridement. Severity of Tissue Post Debridement is: Fat layer exposed. Post procedure Diagnosis Wound #2: Same as Pre-Procedure Pre-procedure diagnosis of Wound #2 is a Venous Leg Ulcer located on the Right,Proximal,Medial Lower Leg . There was a Double Layer Compression Therapy Procedure by Deon Pilling, RN. Post procedure Diagnosis Wound #2: Same as Pre-Procedure Wound #3 Pre-procedure diagnosis of Wound #3 is a Venous Leg Ulcer located on the Right,Distal,Medial Lower Leg .Severity of Tissue Pre Debridement is: Fat layer exposed. There was a Chemical/Enzymatic/Mechanical debridement performed by Deon Pilling, RN.Marland Kitchen Agent used was [employer]. There was no bleeding. The procedure was tolerated well. Post Debridement Measurements: 0.2cm length x 0.3cm width x 0.1cm depth; 0.005cm^3 volume. Character of Wound/Ulcer Post Debridement requires further debridement. Severity of  Tissue Post Debridement is: Fat layer exposed. Post procedure Diagnosis Wound #3: Same as Pre-Procedure Pre-procedure diagnosis of Wound #3 is a Venous Leg Ulcer located on the Right,Distal,Medial Lower Leg . There was a Double Layer Compression Therapy Procedure by Deon Pilling, RN. Post procedure Diagnosis Wound #3: Same as Pre-Procedure Plan Follow-up Appointments: Return Appointment in 1 week. - Dr Heber Inkster 06/20/2022 1230 overflow room 7 Thursday Return Appointment in 2  weeks. - Dr. Heber Elliott 06/28/2022 0930 Friday Room 8 Anesthetic: Wound #2 Right,Proximal,Medial Lower Leg: (In clinic) Topical Lidocaine 5% applied to wound bed Wound #3 Right,Distal,Medial Lower Leg: (In clinic) Topical Lidocaine 5% applied to wound bed Bathing/ Shower/ Hygiene: May shower with protection but do not get wound dressing(s) wet. Protect dressing(s) with water repellant cover (for example, large plastic bag) or a cast cover and may then take shower. - cast protector may be purchased from [facility], [facility] or [employer] Edema Control - Lymphedema / SCD / Other: Elevate legs to the level of the heart or above for 30 minutes daily and/or when sitting for 3-4 times a day throughout the day. Avoid standing for long periods of time. Exercise regularly WOUND #2: - Lower Leg Wound Laterality: Right, Medial, Proximal Cleanser: Soap and Water 1 x Per Week/15 Days Discharge Instructions: May shower and wash wound with dial antibacterial soap and water prior to dressing change. Peri-Wound Care: Sween Lotion (Moisturizing lotion) 1 x Per Week/15 Days Discharge Instructions: Apply moisturizing lotion as directed Topical: zinc at the top of lower portion of leg to secure wrap in place. 1 x Per Week/15 Days Discharge Instructions: see product. Prim Dressing: Hydrofera Blue Ready Transfer Foam, 2.5x2.5 (in/in) 1 x Per Week/15 Days ary Discharge Instructions: Apply directly to wound bed as directed Prim Dressing: Santyl Ointment 1 x Per Week/15 Days ary Discharge Instructions: Apply nickel thick amount to wound bed as instructed Secondary Dressing: Woven Gauze Sponge, Non-Sterile 4x4 in 1 x Per Week/15 Days Discharge Instructions: Apply over primary dressing as directed. Com pression Wrap: FourPress (4 layer compression wrap) 1 x Per Week/15 Days Discharge Instructions: Apply four layer compression as directed. May also use URGO K2. WOUND #3: - Lower Leg Wound Laterality: Right, Medial,  Distal Cleanser: Soap and Water 1 x Per Week/15 Days Discharge Instructions: May shower and wash wound with dial antibacterial soap and water prior to dressing change. Peri-Wound Care: Sween Lotion (Moisturizing lotion) 1 x Per Week/15 Days Discharge Instructions: Apply moisturizing lotion as directed CHADWICK, KOSTRZEWSKI ([id number]) [phone].pdf Page 8 of 9 Topical: zinc at the top of lower portion of leg to secure wrap in place. 1 x Per Week/15 Days Discharge Instructions: see product. Prim Dressing: Hydrofera Blue Ready Transfer Foam, 2.5x2.5 (in/in) 1 x Per Week/15 Days ary Discharge Instructions: Apply directly to wound bed as directed Prim Dressing: Santyl Ointment 1 x Per Week/15 Days ary Discharge Instructions: Apply nickel thick amount to wound bed as instructed Secondary Dressing: Woven Gauze Sponge, Non-Sterile 4x4 in 1 x Per Week/15 Days Discharge Instructions: Apply over primary dressing as directed. Com pression Wrap: FourPress (4 layer compression wrap) 1 x Per Week/15 Days Discharge Instructions: Apply four layer compression as directed. May also use URGO K2. 1. Santyl and Hydrofera Blue under 4-layer compressionooright lower extremity 2. Follow-up in 1 week Electronic Signature(s) Signed: 06/24/2022 12:24:04 PM By: Deon Pilling RN, BSN Signed: 06/24/2022 1:15:42 PM By: Kalman Shan DO Previous Signature: 06/14/2022 11:56:15 AM Version By: Kalman Shan DO Entered By: Deon Pilling on 06/24/2022 12:19:38 --------------------------------------------------------------------------------  HxROS Details Patient Name: Date of Service: KADEN, JOHL 06/14/2022 8:45 A M Medical Record Number: [id number] Patient Account Number: [id number] Date of Birth/Sex: Treating RN: Sep 26, 1980 (42 y.o. M) Primary Care Provider: PCP, NO Other Clinician: Referring Provider: Treating Provider/Extender: Yaakov Guthrie in Treatment: 1 Information  Obtained From Patient Respiratory Medical History: Positive for: Sleep Apnea Past Medical History Notes: Sarcoidosis of lung Cardiovascular Medical History: Positive for: Hypertension Past Medical History Notes: Mediastinal lymphadenopathy Immunological Medical History: Past Medical History Notes: Sarcoidosis Musculoskeletal Medical History: Positive for: Rheumatoid Arthritis; Osteoarthritis - knee Immunizations Pneumococcal Vaccine: Received Pneumococcal Vaccination: No Implantable Devices None Family and Social History Cancer: Yes - Maternal Grandparents; Diabetes: Yes - Father; Heart Disease: Yes - Father; Hereditary Spherocytosis: No; Hypertension: Yes - Father; Kidney Disease: No; Lung Disease: No; Seizures: No; Stroke: Yes - Father; Thyroid Problems: No; Tuberculosis: No; Former smoker - Quit 2017; Marital PARISH, PEPITONE ([id number]) [id number].pdf Page 9 of 9 Status - Married; Alcohol Use: Moderate; Drug Use: No History; Caffeine Use: Daily; Financial Concerns: No; Food, Clothing or Shelter Needs: No; Support System Lacking: No; Transportation Concerns: No Electronic Signature(s) Signed: 06/14/2022 11:56:15 AM By: Kalman Shan DO Entered By: Kalman Shan on 06/14/2022 09:40:21 -------------------------------------------------------------------------------- SuperBill Details Patient Name: Date of Service: Sherryl Manges. 06/14/2022 Medical Record Number: [id number] Patient Account Number: [id number] Date of Birth/Sex: Treating RN: 07-09-1980 (42 y.o. 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 Facility Procedures : CPT4 Code: [id number] Description: (Facility Use Only) [id number] - [facility] RT LEG Modifier: Quantity: 1 Physician Procedures : CPT4 Code Description Modifier [id number] - WC PHYS LEVEL 3 - EST PT ICD-10 Diagnosis Description 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

## 2022-06-15 NOTE — Progress Notes (Signed)
Anthony, [name] ([id number]) [id number].pdf Page 1 of 11 Visit Report for 06/14/2022 Arrival Information Details Patient Name: Date of Service: Anthony, [name] 06/14/2022 8:45 A M Medical Record Number: [id number] Patient Account Number: [id number] Date of Birth/Sex: Treating RN: Feb 26, 1981 (42 y.o. Anthony [name] Primary Care Kailand Seda: PCP, NO Other Clinician: Referring Lakechia Nay: Treating Ashland Wiseman/Extender: Yaakov Guthrie in Treatment: 1 Visit Information History Since Last Visit Added or deleted any medications: No Patient Arrived: Ambulatory Any new allergies or adverse reactions: No Arrival Time: 09:03 Had a fall or experienced change in No Accompanied By: self activities of daily living that may affect Transfer Assistance: None risk of falls: Patient Identification Verified: Yes Signs or symptoms of abuse/neglect since last visito No Secondary Verification Process Completed: Yes Hospitalized since last visit: No Patient Requires Transmission-Based Precautions: No Implantable device outside of the clinic excluding No Patient Has Alerts: No cellular tissue based products placed in the center since last visit: Has Dressing in Place as Prescribed: No Has Compression in Place as Prescribed: No Pain Present Now: No Notes wrap slid down over the weekend and remove it Monday. Severin Bou made aware. Electronic Signature(s) Signed: 06/14/2022 6:10:45 PM By: Deon Pilling RN, BSN Entered By: Deon Pilling on 06/14/2022 09:03:54 -------------------------------------------------------------------------------- Compression Therapy Details Patient Name: Date of Service: Anthony [name]. 06/14/2022 8:45 A M Medical Record Number: [id number] Patient Account Number: [id number] Date of Birth/Sex: Treating RN: January 10, 1981 (42 y.o. Anthony [name] Primary Care Zayla Agar: PCP, NO Other Clinician: Referring Tranika Scholler: Treating Hazelene Doten/Extender: Yaakov Guthrie in Treatment: 1 Compression Therapy Performed for Wound Assessment: Wound #2 Right,Proximal,Medial Lower Leg Performed By: Clinician Deon Pilling, RN Compression Type: Double Layer Post Procedure Diagnosis Same as Pre-procedure Electronic Signature(s) Signed: 06/14/2022 6:10:45 PM By: Deon Pilling RN, BSN Entered By: Deon Pilling on 06/14/2022 09:28:17 [name] ([id number]) [id number] Page 2 of 11 -------------------------------------------------------------------------------- Compression Therapy Details Patient Name: Date of Service: Anthony, [name] 06/14/2022 8:45 A M Medical Record Number: [id number] Patient Account Number: [id number] Date of Birth/Sex: Treating RN: 08-15-1980 (42 y.o. Anthony [name] Primary Care Nyomi Howser: PCP, NO Other Clinician: Referring Miaa Latterell: Treating Armand Preast/Extender: Yaakov Guthrie in Treatment: 1 Compression Therapy Performed for Wound Assessment: Wound #3 Right,Distal,Medial Lower Leg Performed By: Clinician Deon Pilling, RN Compression Type: Double Layer Post Procedure Diagnosis Same as Pre-procedure Electronic Signature(s) Signed: 06/14/2022 6:10:45 PM By: Deon Pilling RN, BSN Entered By: Deon Pilling on 06/14/2022 09:28:17 -------------------------------------------------------------------------------- Encounter Discharge Information Details Patient Name: Date of Service: Anthony [name]. 06/14/2022 8:45 A M Medical Record Number: [id number] Patient Account Number: [id number] Date of Birth/Sex: Treating RN: 1980/09/02 (42 y.o. Anthony [name] Primary Care Daliyah Sramek: PCP, NO Other Clinician: Referring Tykia Mellone: Treating Kareli Hossain/Extender: Yaakov Guthrie in Treatment: 1 Encounter Discharge Information Items Post Procedure Vitals Discharge Condition: Stable Temperature (F): 99 Ambulatory Status: Ambulatory Pulse (bpm): 77 Discharge Destination: Home Respiratory Rate  (breaths/min): 20 Transportation: Private Auto Blood Pressure (mmHg): 154/82 Accompanied By: self Schedule Follow-up Appointment: Yes Clinical Summary of Care: Electronic Signature(s) Signed: 06/14/2022 6:10:45 PM By: Deon Pilling RN, BSN Entered By: Deon Pilling on 06/14/2022 09:33:59 -------------------------------------------------------------------------------- Lower Extremity Assessment Details Patient Name: Date of Service: Anthony [name]. 06/14/2022 8:45 A M Medical Record Number: [id number] Patient Account Number: [id number] Date of Birth/Sex: Treating RN: 01-21-1981 (42 y.o. Anthony [name] Primary Care Tariq Pernell: PCP, NO Other Clinician: Referring Icelynn Onken: Treating Esau Fridman/Extender: Yaakov Guthrie in  Treatment: 1 Edema Assessment Left: [Left: Right] [Right: :] Assessed: [Left: No] [Right: Yes] Edema: [Left: Ye] [Right: s] Calf Left: Right: Point of Measurement: 32 cm From Medial Instep 65 cm Ankle Left: Right: Point of Measurement: 11 cm From Medial Instep 34 cm Vascular Assessment Pulses: Dorsalis Pedis Palpable: [Right:Yes] Electronic Signature(s) Signed: 06/14/2022 6:10:45 PM By: Deon Pilling RN, BSN Entered By: Deon Pilling on 06/14/2022 09:05:18 -------------------------------------------------------------------------------- Multi Wound Chart Details Patient Name: Date of Service: Anthony [name]. 06/14/2022 8:45 A M Medical Record Number: [id number] Patient Account Number: [id number] Date of Birth/Sex: Treating RN: Jan 26, 1981 (42 y.o. M) Primary Care Kerrie Latour: PCP, NO Other Clinician: Referring Ellarae Nevitt: Treating Lutricia Widjaja/Extender: Yaakov Guthrie in Treatment: 1 Vital Signs Height(in): 68 Pulse(bpm): 77 Weight(lbs): 027 Blood Pressure(mmHg): 154/82 Body Mass Index(BMI): 87.3 Temperature(F): 99 Respiratory Rate(breaths/min): 20 [2:Photos:] [N/A:N/A] Right, Proximal, Medial Lower Leg Right, Distal, Medial Lower Leg N/A Wound  Location: Gradually Appeared Gradually Appeared N/A Wounding Event: Venous Leg Ulcer Venous Leg Ulcer N/A Primary Etiology: Sleep Apnea, Hypertension, Sleep Apnea, Hypertension, N/A Comorbid History: Rheumatoid Arthritis, Osteoarthritis Rheumatoid Arthritis, Osteoarthritis 04/03/2022 04/03/2022 N/A Date Acquired: 1 1 N/A Weeks of Treatment: Open Open N/A Wound Status: No No N/A Wound Recurrence: 0.4x0.5x0.1 0.2x0.3x0.1 N/A Measurements L x W x D (cm) 0.157 0.047 N/A A (cm) : rea 0.016 0.005 N/A Volume (cm) : 55.50% 71.50% N/A % Reduction in Area: 54.30% 68.80% N/A % Reduction in Volume: Full Thickness Without Exposed Full Thickness Without Exposed N/A Classification: Support Structures Support Structures Medium Medium N/A Exudate Amount: WILLIS, HOLQUIN ([id number]) [id number] Page 4 of 11 Serosanguineous Serosanguineous N/A Exudate Type: red, brown red, brown N/A Exudate Color: Distinct, outline attached Distinct, outline attached N/A Wound Margin: Medium (34-66%) Small (1-33%) N/A Granulation Amount: Red Red N/A Granulation Quality: Medium (34-66%) Large (67-100%) N/A Necrotic Amount: Fat Layer (Subcutaneous Tissue): Yes Fat Layer (Subcutaneous Tissue): Yes N/A Exposed Structures: Fascia: No Fascia: No Tendon: No Tendon: No Muscle: No Muscle: No Joint: No Joint: No Bone: No Bone: No Small (1-33%) Small (1-33%) N/A Epithelialization: Chemical/Enzymatic/Mechanical Chemical/Enzymatic/Mechanical N/A Debridement: N/A N/A N/A Instrument: None None N/A Bleeding: Debridement Treatment Response: Procedure was tolerated well Procedure was tolerated well N/A Post Debridement Measurements L x 0.4x0.5x0.1 0.2x0.3x0.1 N/A W x D (cm) 0.016 0.005 N/A Post Debridement Volume: (cm) Excoriation: No Excoriation: No N/A Periwound Skin Texture: Induration: No Induration: No Callus: No Callus: No Crepitus: No Crepitus: No Rash:  No Rash: No Scarring: No Scarring: No Maceration: No Maceration: No N/A Periwound Skin Moisture: Dry/Scaly: No Dry/Scaly: No Hemosiderin Staining: Yes Hemosiderin Staining: Yes N/A Periwound Skin Color: Atrophie Blanche: No Atrophie Blanche: No Cyanosis: No Cyanosis: No Ecchymosis: No Ecchymosis: No Erythema: No Erythema: No Mottled: No Mottled: No Pallor: No Pallor: No Rubor: No Rubor: No Compression Therapy Compression Therapy N/A Procedures Performed: Debridement Debridement Treatment Notes Wound #2 (Lower Leg) Wound Laterality: Right, Medial, Proximal Cleanser Soap and Water Discharge Instruction: May shower and wash wound with dial antibacterial soap and water prior to dressing change. Peri-Wound Care Sween Lotion (Moisturizing lotion) Discharge Instruction: Apply moisturizing lotion as directed Topical zinc at the top of lower portion of leg to secure wrap in place. Discharge Instruction: see product. Primary Dressing Hydrofera Blue Ready Transfer Foam, 2.5x2.5 (in/in) Discharge Instruction: Apply directly to wound bed as directed Santyl Ointment Discharge Instruction: Apply nickel thick amount to wound bed as instructed Secondary Dressing Woven Gauze Sponge, Non-Sterile 4x4 in Discharge Instruction: Apply over primary dressing as directed.  Secured With Compression Wrap FourPress (4 layer compression wrap) Discharge Instruction: Apply four layer compression as directed. May also use URGO K2. Compression Stockings Add-Ons Wound #3 (Lower Leg) Wound Laterality: Right, Medial, Distal Cleanser Soap and Water Discharge Instruction: May shower and wash wound with dial antibacterial soap and water prior to dressing change. EMMET, MESSER ([id number]) [phone].pdf Page 5 of 11 Peri-Wound Care Sween Lotion (Moisturizing lotion) Discharge Instruction: Apply moisturizing lotion as directed Topical zinc at the top of lower portion of  leg to secure wrap in place. Discharge Instruction: see product. Primary Dressing Hydrofera Blue Ready Transfer Foam, 2.5x2.5 (in/in) Discharge Instruction: Apply directly to wound bed as directed Santyl Ointment Discharge Instruction: Apply nickel thick amount to wound bed as instructed Secondary Dressing Woven Gauze Sponge, Non-Sterile 4x4 in Discharge Instruction: Apply over primary dressing as directed. Secured With Compression Wrap FourPress (4 layer compression wrap) Discharge Instruction: Apply four layer compression as directed. May also use URGO K2. Compression Stockings Add-Ons Electronic Signature(s) Signed: 06/14/2022 11:56:15 AM By: Geralyn Corwin DO Entered By: Geralyn Corwin on 06/14/2022 09:39:22 -------------------------------------------------------------------------------- Multi-Disciplinary Care Plan Details Patient Name: Date of Service: Marveen Reeks RO N [name]. 06/14/2022 8:45 A M Medical Record Number: [id number] Patient Account Number: [id number] Date of Birth/Sex: Treating RN: 06-17-1980 (41 y.o. Tammy Sours Primary Care Alantis Bethune: PCP, NO Other Clinician: Referring Hershel Corkery: Treating Hung Rhinesmith/Extender: Tilda Franco in Treatment: 1 Active Inactive Venous Leg Ulcer Nursing Diagnoses: Actual venous Insuffiency (use after diagnosis is confirmed) Knowledge deficit related to disease process and management Goals: Patient will maintain optimal edema control Date Initiated: 06/06/2022 Target Resolution Date: 07/11/2022 Goal Status: Active Patient/caregiver will verbalize understanding of disease process and disease management Date Initiated: 06/06/2022 Target Resolution Date: 07/11/2022 Goal Status: Active Interventions: Assess peripheral edema status every visit. Compression as ordered Provide education on venous insufficiency NotesKERRI, ASCHE ([id number]) [phone].pdf Page 6 of 11 Wound/Skin Impairment Nursing  Diagnoses: Impaired tissue integrity Knowledge deficit related to ulceration/compromised skin integrity Goals: Patient/caregiver will verbalize understanding of skin care regimen Date Initiated: 06/06/2022 Target Resolution Date: 06/21/2022 Goal Status: Active Ulcer/skin breakdown will have a volume reduction of 30% by week 4 Date Initiated: 06/06/2022 Target Resolution Date: 07/04/2022 Goal Status: Active Interventions: Assess patient/caregiver ability to obtain necessary supplies Assess patient/caregiver ability to perform ulcer/skin care regimen upon admission and as needed Assess ulceration(s) every visit Provide education on ulcer and skin care Notes: Electronic Signature(s) Signed: 06/14/2022 6:10:45 PM By: Shawn Stall RN, BSN Entered By: Shawn Stall on 06/14/2022 09:10:23 -------------------------------------------------------------------------------- Pain Assessment Details Patient Name: Date of Service: Marveen Reeks RO N [name]. 06/14/2022 8:45 A M Medical Record Number: [id number] Patient Account Number: [id number] Date of Birth/Sex: Treating RN: 01-Apr-1981 (41 y.o. Tammy Sours Primary Care Eletha Culbertson: PCP, NO Other Clinician: Referring Makaiah Terwilliger: Treating Vahan Wadsworth/Extender: Tilda Franco in Treatment: 1 Active Problems Location of Pain Severity and Description of Pain Patient Has Paino No Site Locations Pain Management and Medication Current Pain Management: Electronic Signature(s) Signed: 06/14/2022 6:10:45 PM By: Shawn Stall RN, BSN Entered By: Shawn Stall on 06/14/2022 09:04:02 Shella Spearing ([id number]) [id number] Page 7 of 11 -------------------------------------------------------------------------------- Patient/Caregiver Education Details Patient Name: Date of Service: DAMONDRE, PFEIFLE 2/2/2024andnbsp8:45 A M Medical Record Number: [id number] Patient Account Number: [id number] Date of Birth/Gender: Treating  RN: 1981-03-19 (41 y.o. Tammy Sours Primary Care Physician: PCP, NO Other Clinician: Referring Physician: Treating Physician/Extender: Tilda Franco in Treatment: 1 Education Assessment Education Provided To: Patient  Education Topics Provided Wound/Skin Impairment: Handouts: Caring for Your Ulcer Methods: Explain/Verbal Responses: Reinforcements needed Electronic Signature(s) Signed: 06/14/2022 6:10:45 PM By: Deon Pilling RN, BSN Entered By: Deon Pilling on 06/14/2022 09:10:49 -------------------------------------------------------------------------------- Wound Assessment Details Patient Name: Date of Service: Anthony [name]. 06/14/2022 8:45 A M Medical Record Number: [id number] Patient Account Number: [id number] Date of Birth/Sex: Treating RN: December 29, 1980 (42 y.o. Anthony [name] Primary Care Amilcar Reever: PCP, NO Other Clinician: Referring Aleiya Rye: Treating Avir Deruiter/Extender: Yaakov Guthrie in Treatment: 1 Wound Status Wound Number: 2 Primary Etiology: Venous Leg Ulcer Wound Location: Right, Proximal, Medial Lower Leg Wound Status: Open Wounding Event: Gradually Appeared Comorbid Sleep Apnea, Hypertension, Rheumatoid Arthritis, History: Osteoarthritis Date Acquired: 04/03/2022 Weeks Of Treatment: 1 Clustered Wound: No Photos KASYN, ROLPH ([id number]) [id number] Page 8 of 11 Wound Measurements Length: (cm) 0.4 Width: (cm) 0.5 Depth: (cm) 0.1 Area: (cm) 0.157 Volume: (cm) 0.016 % Reduction in Area: 55.5% % Reduction in Volume: 54.3% Epithelialization: Small (1-33%) Tunneling: No Undermining: No Wound Description Classification: Full Thickness Without Exposed Suppor Wound Margin: Distinct, outline attached Exudate Amount: Medium Exudate Type: Serosanguineous Exudate Color: red, brown t Structures Foul Odor After Cleansing: No Slough/Fibrino Yes Wound Bed Granulation Amount: Medium (34-66%) Exposed  Structure Granulation Quality: Red Fascia Exposed: No Necrotic Amount: Medium (34-66%) Fat Layer (Subcutaneous Tissue) Exposed: Yes Necrotic Quality: Adherent Slough Tendon Exposed: No Muscle Exposed: No Joint Exposed: No Bone Exposed: No Periwound Skin Texture Texture Color No Abnormalities Noted: No No Abnormalities Noted: No Callus: No Atrophie Blanche: No Crepitus: No Cyanosis: No Excoriation: No Ecchymosis: No Induration: No Erythema: No Rash: No Hemosiderin Staining: Yes Scarring: No Mottled: No Pallor: No Moisture Rubor: No No Abnormalities Noted: No Dry / Scaly: No Maceration: No Treatment Notes Wound #2 (Lower Leg) Wound Laterality: Right, Medial, Proximal Cleanser Soap and Water Discharge Instruction: May shower and wash wound with dial antibacterial soap and water prior to dressing change. Peri-Wound Care Sween Lotion (Moisturizing lotion) Discharge Instruction: Apply moisturizing lotion as directed Topical zinc at the top of lower portion of leg to secure wrap in place. Discharge Instruction: see product. Primary Dressing Hydrofera Blue Ready Transfer Foam, 2.5x2.5 (in/in) Discharge Instruction: Apply directly to wound bed as directed Santyl Ointment Discharge Instruction: Apply nickel thick amount to wound bed as instructed Secondary Dressing Woven Gauze Sponge, Non-Sterile 4x4 in Discharge Instruction: Apply over primary dressing as directed. Secured With Compression Wrap FourPress (4 layer compression wrap) Discharge Instruction: Apply four layer compression as directed. May also use URGO K2. Compression Stockings Add-Ons TAVARI, LOADHOLT ([id number]) [phone].pdf Page 9 of 11 Electronic Signature(s) Signed: 06/14/2022 6:10:45 PM By: Deon Pilling RN, BSN Entered By: Deon Pilling on 06/14/2022 09:10:38 -------------------------------------------------------------------------------- Wound Assessment Details Patient  Name: Date of Service: Anthony [name]. 06/14/2022 8:45 A M Medical Record Number: [id number] Patient Account Number: [id number] Date of Birth/Sex: Treating RN: January 17, 1981 (42 y.o. Anthony [name] Primary Care Lonzo Saulter: PCP, NO Other Clinician: Referring Mahlon Gabrielle: Treating Adiana Smelcer/Extender: Yaakov Guthrie in Treatment: 1 Wound Status Wound Number: 3 Primary Etiology: Venous Leg Ulcer Wound Location: Right, Distal, Medial Lower Leg Wound Status: Open Wounding Event: Gradually Appeared Comorbid Sleep Apnea, Hypertension, Rheumatoid Arthritis, History: Osteoarthritis Date Acquired: 04/03/2022 Weeks Of Treatment: 1 Clustered Wound: No Photos Wound Measurements Length: (cm) 0.2 Width: (cm) 0.3 Depth: (cm) 0.1 Area: (cm) 0.047 Volume: (cm) 0.005 % Reduction in Area: 71.5% % Reduction in Volume: 68.8% Epithelialization: Small (1-33%) Tunneling: No Undermining: No Wound Description Classification:  Full Thickness Without Exposed Support Structures Wound Margin: Distinct, outline attached Exudate Amount: Medium Exudate Type: Serosanguineous Exudate Color: red, brown Foul Odor After Cleansing: No Slough/Fibrino Yes Wound Bed Granulation Amount: Small (1-33%) Exposed Structure Granulation Quality: Red Fascia Exposed: No Necrotic Amount: Large (67-100%) Fat Layer (Subcutaneous Tissue) Exposed: Yes Necrotic Quality: Adherent Slough Tendon Exposed: No Muscle Exposed: No Joint Exposed: No Bone Exposed: No Periwound Skin Texture Texture Color No Abnormalities Noted: No No Abnormalities Noted: No Callus: No Atrophie Blanche: No Crepitus: No Cyanosis: No Excoriation: No Ecchymosis: No Induration: No Erythema: No Rash: No Hemosiderin Staining: Yes KEIGO, WHALLEY ([id number]) [id number] Page 10 of 11 Scarring: No Mottled: No Pallor: No Moisture Rubor: No No Abnormalities Noted: No Dry / Scaly: No Maceration: No Treatment  Notes Wound #3 (Lower Leg) Wound Laterality: Right, Medial, Distal Cleanser Soap and Water Discharge Instruction: May shower and wash wound with dial antibacterial soap and water prior to dressing change. Peri-Wound Care Sween Lotion (Moisturizing lotion) Discharge Instruction: Apply moisturizing lotion as directed Topical zinc at the top of lower portion of leg to secure wrap in place. Discharge Instruction: see product. Primary Dressing Hydrofera Blue Ready Transfer Foam, 2.5x2.5 (in/in) Discharge Instruction: Apply directly to wound bed as directed Santyl Ointment Discharge Instruction: Apply nickel thick amount to wound bed as instructed Secondary Dressing Woven Gauze Sponge, Non-Sterile 4x4 in Discharge Instruction: Apply over primary dressing as directed. Secured With Compression Wrap FourPress (4 layer compression wrap) Discharge Instruction: Apply four layer compression as directed. May also use URGO K2. Compression Stockings Add-Ons Electronic Signature(s) Signed: 06/14/2022 6:10:45 PM By: Deon Pilling RN, BSN Entered By: Deon Pilling on 06/14/2022 09:11:00 -------------------------------------------------------------------------------- Vitals Details Patient Name: Date of Service: Anthony [name]. 06/14/2022 8:45 A M Medical Record Number: [id number] Patient Account Number: [id number] Date of Birth/Sex: Treating RN: Mar 19, 1981 (42 y.o. Anthony [name] Primary Care Carly Sabo: PCP, NO Other Clinician: Referring Lempi Edwin: Treating Calvin Jablonowski/Extender: Yaakov Guthrie in Treatment: 1 Vital Signs Time Taken: 09:00 Temperature (F): 99 Height (in): 68 Pulse (bpm): 77 Weight (lbs): 574 Respiratory Rate (breaths/min): 20 Body Mass Index (BMI): 87.3 Blood Pressure (mmHg): 154/82 Reference Range: 80 - 120 mg / dl Electronic Signature(s) Signed: 06/14/2022 6:10:45 PM By: Deon Pilling RN, BSN [name] ([id number]) [id number]  Page 11 of 11 Entered By: Deon Pilling on 06/14/2022 09:06:53

## 2022-06-20 ENCOUNTER — Encounter (HOSPITAL_BASED_OUTPATIENT_CLINIC_OR_DEPARTMENT_OTHER): Payer: Commercial Managed Care - HMO | Admitting: Internal Medicine

## 2022-06-20 DIAGNOSIS — I87311 Chronic venous hypertension (idiopathic) with ulcer of right lower extremity: Secondary | ICD-10-CM | POA: Diagnosis not present

## 2022-06-20 DIAGNOSIS — L97812 Non-pressure chronic ulcer of other part of right lower leg with fat layer exposed: Secondary | ICD-10-CM | POA: Diagnosis not present

## 2022-06-20 DIAGNOSIS — D862 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: Secondary | ICD-10-CM | POA: Diagnosis not present

## 2022-06-21 NOTE — Progress Notes (Signed)
JAKING, BRASINGTON ([id number]) [id number] Page 1 of 9 Visit Report for 06/20/2022 Arrival Information Details Patient Name: Date of Service: ASHER, RAJARAM 06/20/2022 12:30 PM Medical Record Number: [id number] Patient Account Number: [id number] Date of Birth/Sex: Treating RN: November 23, 1980 (42 y.o. Mare Ferrari Primary Care Lai Hendriks: PCP, NO Other Clinician: Referring  Ducre: Treating Lesieli Bresee/Extender: Yaakov Guthrie in Treatment: 2 Visit Information History Since Last Visit All ordered tests and consults were completed: No Patient Arrived: Ambulatory Added or deleted any medications: No Arrival Time: 12:49 Any new allergies or adverse reactions: No Accompanied By: self Had a fall or experienced change in No Transfer Assistance: None activities of daily living that may affect Patient Requires Transmission-Based Precautions: No risk of falls: Patient Has Alerts: No Signs or symptoms of abuse/neglect since last visito No Hospitalized since last visit: No Implantable device outside of the clinic excluding No cellular tissue based products placed in the center since last visit: Has Dressing in Place as Prescribed: Yes Has Compression in Place as Prescribed: Yes Pain Present Now: No Electronic Signature(s) Signed: 06/20/2022 5:19:15 PM By: Sharyn Creamer RN, BSN Entered By: Sharyn Creamer on 06/20/2022 12:49:54 -------------------------------------------------------------------------------- Compression Therapy Details Patient Name: Date of Service: [name] 06/20/2022 12:30 PM Medical Record Number: [id number] Patient Account Number: [id number] Date of Birth/Sex: Treating RN: [birth year] (42 y.o. Mare Ferrari Primary Care Odessie Polzin: PCP, NO Other Clinician: Referring Shabana Armentrout: Treating Laksh Hinners/Extender: Yaakov Guthrie in Treatment: 2 Compression Therapy Performed for Wound Assessment: Wound #2 Right,Proximal,Medial  Lower Leg Performed By: Clinician Sharyn Creamer, RN Compression Type: Four Layer Post Procedure Diagnosis Same as Pre-procedure Electronic Signature(s) Signed: 06/20/2022 5:19:15 PM By: Sharyn Creamer RN, BSN Entered By: Sharyn Creamer on 06/20/2022 13:45:36 [name] ([id number] Page 2 of 9 -------------------------------------------------------------------------------- Encounter Discharge Information Details Patient Name: Date of Service: DAVYON, BROSZ 06/20/2022 12:30 PM Medical Record Number: [id number] Patient Account Number: [id number] Date of Birth/Sex: Treating RN: March 24, 1981 (42 y.o. Mare Ferrari Primary Care Donovan Gatchel: PCP, NO Other Clinician: Referring Kenlea Woodell: Treating Jakyrah Holladay/Extender: Yaakov Guthrie in Treatment: 2 Encounter Discharge Information Items Discharge Condition: Stable Ambulatory Status: Ambulatory Discharge Destination: Home Transportation: Private Auto Accompanied By: self Schedule Follow-up Appointment: Yes Clinical Summary of Care: Patient Declined Electronic Signature(s) Signed: 06/20/2022 5:19:15 PM By: Sharyn Creamer RN, BSN Entered By: Sharyn Creamer on 06/20/2022 13:46:25 -------------------------------------------------------------------------------- Lower Extremity Assessment Details Patient Name: Date of Service: [name] 06/20/2022 12:30 PM Medical Record Number: [id number] Patient Account Number: [id number] Date of Birth/Sex: Treating RN: 1980-08-09 (42 y.o. Mare Ferrari Primary Care Alyviah Crandle: PCP, NO Other Clinician: Referring Amenda Duclos: Treating Zechariah Bissonnette/Extender: Yaakov Guthrie in Treatment: 2 Edema Assessment Assessed: [Left: No] [Right: No] Edema: [Left: Ye] [Right: s] Calf Left: Right: Point of Measurement: 32 cm From Medial Instep 61 cm Ankle Left: Right: Point of Measurement: 11 cm From Medial Instep 32 cm Vascular  Assessment Pulses: Dorsalis Pedis Palpable: [Right:Yes] Electronic Signature(s) Signed: 06/20/2022 5:19:15 PM By: Sharyn Creamer RN, BSN Entered By: Sharyn Creamer on 06/20/2022 12:53:31 [name] ([id number] Page 3 of 9 -------------------------------------------------------------------------------- Multi Wound Chart Details Patient Name: Date of Service: RAHAT, VOORHEES 06/20/2022 12:30 PM Medical Record Number: [id number] Patient Account Number: [id number] Date of Birth/Sex: Treating RN: Apr 26, 1981 (42 y.o. M) Primary Care Tory Septer: PCP, NO Other Clinician: Referring Cheskel Silverio: Treating Kenna Kirn/Extender: Yaakov Guthrie in Treatment: 2 Vital Signs Height(in): 68 Pulse(bpm): 76 Weight(lbs):  574 Blood Pressure(mmHg): 151/71 Body Mass Index(BMI): 87.3 Temperature(F): 98.5 Respiratory Rate(breaths/min): 18 [2:Photos:] [N/A:N/A] Right, Proximal, Medial Lower Leg Right, Distal, Medial Lower Leg N/A Wound Location: Gradually Appeared Gradually Appeared N/A Wounding Event: Venous Leg Ulcer Venous Leg Ulcer N/A Primary Etiology: Sleep Apnea, Hypertension, Sleep Apnea, Hypertension, N/A Comorbid History: Rheumatoid Arthritis, Osteoarthritis Rheumatoid Arthritis, Osteoarthritis 04/03/2022 04/03/2022 N/A Date Acquired: 2 2 N/A Weeks of Treatment: Open Healed - Epithelialized N/A Wound Status: No No N/A Wound Recurrence: 0.5x0.6x0.1 0x0x0 N/A Measurements L x W x D (cm) 0.236 0 N/A A (cm) : rea 0.024 0 N/A Volume (cm) : 33.10% 100.00% N/A % Reduction in Area: 31.40% 100.00% N/A % Reduction in Volume: Full Thickness Without Exposed Full Thickness Without Exposed N/A Classification: Support Structures Support Structures Medium None Present N/A Exudate Amount: Serosanguineous N/A N/A Exudate Type: red, brown N/A N/A Exudate Color: Distinct, outline attached Distinct, outline attached N/A Wound Margin: Large  (67-100%) None Present (0%) N/A Granulation Amount: Red, Hyper-granulation N/A N/A Granulation Quality: Small (1-33%) None Present (0%) N/A Necrotic Amount: Fat Layer (Subcutaneous Tissue): Yes Fascia: No N/A Exposed Structures: Fascia: No Fat Layer (Subcutaneous Tissue): No Tendon: No Tendon: No Muscle: No Muscle: No Joint: No Joint: No Bone: No Bone: No Small (1-33%) Large (67-100%) N/A Epithelialization: Excoriation: No Excoriation: No N/A Periwound Skin Texture: Induration: No Induration: No Callus: No Callus: No Crepitus: No Crepitus: No Rash: No Rash: No Scarring: No Scarring: No Maceration: No Maceration: No N/A Periwound Skin Moisture: Dry/Scaly: No Dry/Scaly: No Hemosiderin Staining: Yes Hemosiderin Staining: Yes N/A Periwound Skin Color: Atrophie Blanche: No Atrophie Blanche: No Cyanosis: No Cyanosis: No Ecchymosis: No Ecchymosis: No Erythema: No Erythema: No Mottled: No Mottled: No Pallor: No Pallor: No Rubor: No Rubor: No CALIBER, OMANA ([id number].pdf Page 4 of 9 Treatment Notes Electronic Signature(s) Signed: 06/20/2022 3:04:17 PM By: Kalman Shan DO Entered By: Kalman Shan on 06/20/2022 13:34:40 -------------------------------------------------------------------------------- Multi-Disciplinary Care Plan Details Patient Name: Date of Service: [name] 06/20/2022 12:30 PM Medical Record Number: [id number] Patient Account Number: [id number] Date of Birth/Sex: Treating RN: 01-17-1981 (42 y.o. Mare Ferrari Primary Care Dnya Hickle: PCP, NO Other Clinician: Referring Landrey Mahurin: Treating Brynley Cuddeback/Extender: Yaakov Guthrie in Treatment: 2 Active Inactive Venous Leg Ulcer Nursing Diagnoses: Actual venous Insuffiency (use after diagnosis is confirmed) Knowledge deficit related to disease process and management Goals: Patient will maintain optimal edema control Date Initiated:  06/06/2022 Target Resolution Date: 07/11/2022 Goal Status: Active Patient/caregiver will verbalize understanding of disease process and disease management Date Initiated: 06/06/2022 Target Resolution Date: 07/11/2022 Goal Status: Active Interventions: Assess peripheral edema status every visit. Compression as ordered Provide education on venous insufficiency Notes: Wound/Skin Impairment Nursing Diagnoses: Impaired tissue integrity Knowledge deficit related to ulceration/compromised skin integrity Goals: Patient/caregiver will verbalize understanding of skin care regimen Date Initiated: 06/06/2022 Target Resolution Date: 06/21/2022 Goal Status: Active Ulcer/skin breakdown will have a volume reduction of 30% by week 4 Date Initiated: 06/06/2022 Target Resolution Date: 07/04/2022 Goal Status: Active Interventions: Assess patient/caregiver ability to obtain necessary supplies Assess patient/caregiver ability to perform ulcer/skin care regimen upon admission and as needed Assess ulceration(s) every visit Provide education on ulcer and skin care Notes: Electronic Signature(s) Signed: 06/20/2022 5:19:15 PM By: Sharyn Creamer RN, BSN Entered By: Sharyn Creamer on 06/20/2022 12:58:57 [name] ([id number] Page 5 of 9 -------------------------------------------------------------------------------- Pain Assessment Details Patient Name: Date of Service: GAILEN, LIBERA 06/20/2022 12:30 PM Medical Record Number: [id number] Patient Account  Number: [id number] Date of Birth/Sex: Treating RN: May 24, 1980 (42 y.o. Mare Ferrari Primary Care Abriel Geesey: PCP, NO Other Clinician: Referring Evia Goldsmith: Treating Nixon Sparr/Extender: Yaakov Guthrie in Treatment: 2 Active Problems Location of Pain Severity and Description of Pain Patient Has Paino No Site Locations Pain Management and Medication Current Pain Management: Electronic  Signature(s) Signed: 06/20/2022 5:19:15 PM By: Sharyn Creamer RN, BSN Entered By: Sharyn Creamer on 06/20/2022 12:53:17 -------------------------------------------------------------------------------- Patient/Caregiver Education Details Patient Name: Date of Service: Sherryl Manges 2/8/2024andnbsp12:30 PM Medical Record Number: [id number] Patient Account Number: [id number] Date of Birth/Gender: Treating RN: 03-25-1981 (42 y.o. Mare Ferrari Primary Care Physician: PCP, NO Other Clinician: Referring Physician: Treating Physician/Extender: Yaakov Guthrie in Treatment: 2 Education Assessment Education Provided To: Patient Education Topics Provided Wound/Skin Impairment: Methods: Explain/Verbal Responses: State content correctly WANYE, ZHENG ([id number]) [phone].pdf Page 6 of 9 Electronic Signature(s) Signed: 06/20/2022 5:19:15 PM By: Sharyn Creamer RN, BSN Entered By: Sharyn Creamer on 06/20/2022 12:59:11 -------------------------------------------------------------------------------- Wound Assessment Details Patient Name: Date of Service: [name] 06/20/2022 12:30 PM Medical Record Number: [id number] Patient Account Number: [id number] Date of Birth/Sex: Treating RN: 12-18-1980 (42 y.o. Mare Ferrari Primary Care Kayzen Kendzierski: PCP, NO Other Clinician: Referring Saidee Geremia: Treating Adileny Delon/Extender: Yaakov Guthrie in Treatment: 2 Wound Status Wound Number: 2 Primary Etiology: Venous Leg Ulcer Wound Location: Right, Proximal, Medial Lower Leg Wound Status: Open Wounding Event: Gradually Appeared Comorbid Sleep Apnea, Hypertension, Rheumatoid Arthritis, History: Osteoarthritis Date Acquired: 04/03/2022 Weeks Of Treatment: 2 Clustered Wound: No Photos Wound Measurements Length: (cm) 0.5 Width: (cm) 0.6 Depth: (cm) 0.1 Area: (cm) 0.236 Volume: (cm) 0.024 % Reduction in Area: 33.1% % Reduction in Volume:  31.4% Epithelialization: Small (1-33%) Tunneling: No Undermining: No Wound Description Classification: Full Thickness Without Exposed Support Structures Wound Margin: Distinct, outline attached Exudate Amount: Medium Exudate Type: Serosanguineous Exudate Color: red, brown Foul Odor After Cleansing: No Slough/Fibrino Yes Wound Bed Granulation Amount: Large (67-100%) Exposed Structure Granulation Quality: Red, Hyper-granulation Fascia Exposed: No Necrotic Amount: Small (1-33%) Fat Layer (Subcutaneous Tissue) Exposed: Yes Necrotic Quality: Adherent Slough Tendon Exposed: No Muscle Exposed: No Joint Exposed: No Bone Exposed: No Periwound Skin Texture Texture Color No Abnormalities Noted: No No Abnormalities Noted: No Callus: No Atrophie Blanche: No Crepitus: No Cyanosis: No EUTIMIO, SHADDOX ([id number] Page 7 of 9 Excoriation: No Ecchymosis: No Induration: No Erythema: No Rash: No Hemosiderin Staining: Yes Scarring: No Mottled: No Pallor: No Moisture Rubor: No No Abnormalities Noted: No Dry / Scaly: No Maceration: No Treatment Notes Wound #2 (Lower Leg) Wound Laterality: Right, Medial, Proximal Cleanser Soap and Water Discharge Instruction: May shower and wash wound with dial antibacterial soap and water prior to dressing change. Peri-Wound Care Sween Lotion (Moisturizing lotion) Discharge Instruction: Apply moisturizing lotion as directed Topical zinc at the top of lower portion of leg to secure wrap in place. Discharge Instruction: see product. Primary Dressing Hydrofera Blue Ready Transfer Foam, 2.5x2.5 (in/in) Discharge Instruction: Apply directly to wound bed as directed Santyl Ointment Discharge Instruction: Apply nickel thick amount to wound bed as instructed Secondary Dressing Woven Gauze Sponge, Non-Sterile 4x4 in Discharge Instruction: Apply over primary dressing as directed. Secured With Compression  Wrap FourPress (4 layer compression wrap) Discharge Instruction: Apply four layer compression as directed. May also use URGO K2. Urgo K2, two layer compression system, regular Discharge Instruction: Apply Urgo K2 as directed (alternative to 4 layer compression). Unnaboot w/Calamine, 4x10 (in/yd) Discharge Instruction: Apply Unnaboot as directed. Compression  Stockings Environmental education officer) Signed: 06/20/2022 5:19:15 PM By: Sharyn Creamer RN, BSN Entered By: Sharyn Creamer on 06/20/2022 12:56:11 -------------------------------------------------------------------------------- Wound Assessment Details Patient Name: Date of Service: [name] 06/20/2022 12:30 PM Medical Record Number: [id number] Patient Account Number: [id number] Date of Birth/Sex: Treating RN: 02-05-1981 (42 y.o. Mare Ferrari Primary Care Albertine Lafoy: PCP, NO Other Clinician: Referring Jaydrien Wassenaar: Treating Shakenya Stoneberg/Extender: Yaakov Guthrie in Treatment: 2 Wound Status Wound Number: 3 Primary Etiology: Venous Leg Ulcer Wound Location: Right, Distal, Medial Lower Leg Wound Status: Healed - Epithelialized Wounding Event: Gradually Appeared Comorbid Sleep Apnea, Hypertension, Rheumatoid Arthritis, MARCIANO, MEGA ([id number].pdf Page 8 of 9 History: Osteoarthritis Date Acquired: 04/03/2022 Weeks Of Treatment: 2 Clustered Wound: No Photos Wound Measurements Length: (cm) Width: (cm) Depth: (cm) Area: (cm) Volume: (cm) 0 % Reduction in Area: 100% 0 % Reduction in Volume: 100% 0 Epithelialization: Large (67-100%) 0 Tunneling: No 0 Undermining: No Wound Description Classification: Full Thickness Without Exposed Support Wound Margin: Distinct, outline attached Exudate Amount: None Present Structures Foul Odor After Cleansing: No Slough/Fibrino No Wound Bed Granulation Amount: None Present (0%) Exposed Structure Necrotic Amount: None Present  (0%) Fascia Exposed: No Fat Layer (Subcutaneous Tissue) Exposed: No Tendon Exposed: No Muscle Exposed: No Joint Exposed: No Bone Exposed: No Periwound Skin Texture Texture Color No Abnormalities Noted: No No Abnormalities Noted: No Callus: No Atrophie Blanche: No Crepitus: No Cyanosis: No Excoriation: No Ecchymosis: No Induration: No Erythema: No Rash: No Hemosiderin Staining: Yes Scarring: No Mottled: No Pallor: No Moisture Rubor: No No Abnormalities Noted: No Dry / Scaly: No Maceration: No Treatment Notes Wound #3 (Lower Leg) Wound Laterality: Right, Medial, Distal Cleanser Peri-Wound Care Topical Primary Dressing Secondary Dressing Secured With Compression Wrap Compression Stockings Add-Ons Electronic Signature(s) AVYON, KELLEY ([id number]) [phone].pdf Page 9 of 9 Signed: 06/20/2022 5:19:15 PM By: Sharyn Creamer RN, BSN Entered By: Sharyn Creamer on 06/20/2022 12:56:48 -------------------------------------------------------------------------------- Bishop Details Patient Name: Date of Service: [name] 06/20/2022 12:30 PM Medical Record Number: [id number] Patient Account Number: [id number] Date of Birth/Sex: Treating RN: August 05, 1980 (42 y.o. Mare Ferrari Primary Care Jakyrie Totherow: PCP, NO Other Clinician: Referring Juline Sanderford: Treating Brent Taillon/Extender: Yaakov Guthrie in Treatment: 2 Vital Signs Time Taken: 12:48 Temperature (F): 98.5 Height (in): 68 Pulse (bpm): 76 Weight (lbs): 574 Respiratory Rate (breaths/min): 18 Body Mass Index (BMI): 87.3 Blood Pressure (mmHg): 151/71 Reference Range: 80 - 120 mg / dl Electronic Signature(s) Signed: 06/20/2022 5:19:15 PM By: Sharyn Creamer RN, BSN Entered By: Sharyn Creamer on 06/20/2022 12:50:12

## 2022-06-21 NOTE — Progress Notes (Signed)
EVEN, POLIO ([id number]) [id number] Page 1 of 8 Visit Report for 06/20/2022 Chief Complaint Document Details Patient Name: Date of Service: Anthony [name], Anthony [name] 06/20/2022 12:30 PM Medical Record Number: [id number] Patient Account Number: [id number] Date of Birth/Sex: Treating RN: July 23, 1980 (42 y.o. M) Primary Care Provider: PCP, NO Other Clinician: Referring Provider: Treating Provider/Extender: Yaakov Guthrie in Treatment: 2 Information Obtained from: Patient Chief Complaint Right lower extremity wound Electronic Signature(s) Signed: 06/20/2022 3:04:17 PM By: Kalman Shan DO Entered By: Kalman Shan on 06/20/2022 13:34:48 -------------------------------------------------------------------------------- HPI Details Patient Name: Date of Service: Anthony Deeds RO N J. 06/20/2022 12:30 PM Medical Record Number: [id number] Patient Account Number: [id number] Date of Birth/Sex: Treating RN: [birth year] (42 y.o. M) Primary Care Provider: PCP, NO Other Clinician: Referring Provider: Treating Provider/Extender: Yaakov Guthrie in Treatment: 2 History of Present Illness HPI Description: Admission 07/31/2021 Anthony [name] is a 42 year old male with a past medical history of sarcoidosis, obstructive sleep apnea and morbid obesity that presents to the clinic for a 1 year history of wounds to his right lower extremity. He states these wax and wane in size. He states that it currently does not drain but has weeped in the past. He reports taking amoxicillin last month and this helped improve the wound size and appearance. He does not have compression stockings. He currently denies signs of infection. 3/28; patient presents for follow-up. He tolerated the compression wrap well. He has no issues or complaints today. He denies signs of infection. 4/4; patient presents for follow-up. He has no issues or complaints 4/11; 1 week follow-up. The patient  appears to be doing very well with the wound on the right medial lower leg. We are using Hydrofera Blue and 3 layer compression. The patient has sarcoidosis and is on chronic prednisone he is convinced that both of these have caused this wound although it certainly looks like this is chronic venous insufficiency with developing lymphedema.He has never worn compression stockings. 4/18; patient presents for follow-up. He has no issues or complaints today. He has not ordered compression stockings yet but states he will do this this week. 4/25; patient presents for follow-up. He has not ordered his compression stockings. He has the information to do so. He tolerated the compression wrap well. He has no issues or complaints today. 06/06/2022 Anthony [name] is a 42 year old male with a past medical history of venous insufficiency previously treated in our clinic last year for right lower extremity wound. He again presents with a similar wound that started spontaneously 2 months ago. He has been keeping the area covered. It sounds like he is never order the compression stockings that were recommended at last clinic admission. We gave him information today to elastic therapy to order these. 2/2; patient presents for follow-up. We were using antibiotic ointment with Hydrofera Blue under 3 layer compression. Unfortunately he states that the wrap slid down 2 days after it was placed. He did not let us know. He has been keeping the area covered. 2/8; patient presents for follow-up. We have been using Santyl and Hydrofera Blue under 4-layer compression to the right lower extremity. One of the two wounds has healed. He tolerated the wrap well but still slid down some this week. Anthony [name], Anthony [name] ([id number]) [id number] Page 2 of 8 Electronic Signature(s) Signed: 06/20/2022 3:04:17 PM By: Kalman Shan DO Entered By: Kalman Shan on 06/20/2022  13:36:37 -------------------------------------------------------------------------------- Physical Exam Details Patient Name: Date of Service: Anthony Deeds RO N  J. 06/20/2022 12:30 PM Medical Record Number: [id number] Patient Account Number: [id number] Date of Birth/Sex: Treating RN: Oct 03, 1980 (42 y.o. M) Primary Care Provider: PCP, NO Other Clinician: Referring Provider: Treating Provider/Extender: Yaakov Guthrie in Treatment: 2 Constitutional respirations regular, non-labored and within target range for patient.. Cardiovascular 2+ dorsalis pedis/posterior tibialis pulses. Psychiatric pleasant and cooperative. Notes Right lower extremity: T the medial aspect there is one open wound with granulation tissue throughout. Epithelialization to the other wound just distal to the o open wound. Stage III lymphedema. Electronic Signature(s) Signed: 06/20/2022 3:04:17 PM By: Kalman Shan DO Entered By: Kalman Shan on 06/20/2022 13:37:24 -------------------------------------------------------------------------------- Physician Orders Details Patient Name: Date of Service: Anthony Deeds RO N J. 06/20/2022 12:30 PM Medical Record Number: [id number] Patient Account Number: [id number] Date of Birth/Sex: Treating RN: [birth year] (42 y.o. Mare Ferrari Primary Care Provider: PCP, NO Other Clinician: Referring Provider: Treating Provider/Extender: Yaakov Guthrie in Treatment: 2 Verbal / Phone Orders: No Diagnosis Coding Follow-up Appointments ppointment in 1 week. - Dr Heber [place] 06/28/2022 0930 Friday Room 8 Return A ppointment in 2 weeks. - Dr. Heber [place] (needs to schedule) Return A Anesthetic Wound #2 Right,Proximal,Medial Lower Leg (In clinic) Topical Lidocaine 5% applied to wound bed Bathing/ Shower/ Hygiene May shower with protection but do not get wound dressing(s) wet. Protect dressing(s) with water repellant cover (for example, large plastic bag) or a cast cover and  may then take shower. - cast protector may be purchased from [facility], [facility] or [employer] Edema Control - Lymphedema / SCD / Other Elevate legs to the level of the heart or above for 30 minutes daily and/or when sitting for 3-4 times a day throughout the day. Avoid standing for long periods of time. Exercise regularly Anthony [name], Anthony [name] ([id number]) [phone].pdf Page 3 of 8 Wound Treatment Wound #2 - Lower Leg Wound Laterality: Right, Medial, Proximal Cleanser: Soap and Water 1 x Per Week/15 Days Discharge Instructions: May shower and wash wound with dial antibacterial soap and water prior to dressing change. Peri-Wound Care: Sween Lotion (Moisturizing lotion) 1 x Per Week/15 Days Discharge Instructions: Apply moisturizing lotion as directed Topical: zinc at the top of lower portion of leg to secure wrap in place. 1 x Per Week/15 Days Discharge Instructions: see product. Prim Dressing: Hydrofera Blue Ready Transfer Foam, 2.5x2.5 (in/in) 1 x Per Week/15 Days ary Discharge Instructions: Apply directly to wound bed as directed Prim Dressing: Santyl Ointment 1 x Per Week/15 Days ary Discharge Instructions: Apply nickel thick amount to wound bed as instructed Secondary Dressing: Woven Gauze Sponge, Non-Sterile 4x4 in 1 x Per Week/15 Days Discharge Instructions: Apply over primary dressing as directed. Compression Wrap: FourPress (4 layer compression wrap) 1 x Per Week/15 Days Discharge Instructions: Apply four layer compression as directed. May also use URGO K2. Compression Wrap: Urgo K2, two layer compression system, regular 1 x Per Week/15 Days Discharge Instructions: Apply Urgo K2 as directed (alternative to 4 layer compression). Compression Wrap: Unnaboot w/Calamine, 4x10 (in/yd) 1 x Per Week/15 Days Discharge Instructions: Apply Unnaboot as directed. Patient Medications llergies: Xarelto A Notifications Medication Indication Start End prior to debridement  06/20/2022 lidocaine DOSE topical 5 % ointment - ointment topical once daily Electronic Signature(s) Signed: 06/20/2022 3:04:17 PM By: Kalman Shan DO Entered By: Kalman Shan on 06/20/2022 13:37:33 -------------------------------------------------------------------------------- Problem List Details Patient Name: Date of Service: Anthony Deeds RO N J. 06/20/2022 12:30 PM Medical Record Number: [id number] Patient Account Number: [id number] Date of Birth/Sex: Treating RN: [birth year] (42 y.o. 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 06/20/2022 12:30 PM Medical Record Number: [id number] Patient Account Number: [id number] Date of Birth/Sex: Treating RN: January 14, 1981 (42 y.o. M) Primary Care Provider: PCP, NO Other Clinician: Referring Provider: Treating Provider/Extender: Yaakov Guthrie in Treatment: 2 Subjective Chief Complaint Information obtained from Patient Right lower extremity wound History of Present Illness (HPI) Admission 07/31/2021 Mr. Hallie Koupal is a 42 year old male with a past medical history of sarcoidosis,  obstructive sleep apnea and morbid obesity that presents to the clinic for a 1 year history of wounds to his right lower extremity. He states these wax and wane in size. He states that it currently does not drain but has weeped in the past. He reports taking amoxicillin last month and this helped improve the wound size and appearance. He does not have compression stockings. He currently denies signs of infection. 3/28; patient presents for follow-up. He tolerated the compression wrap well. He has no issues or complaints today. He denies signs of infection. 4/4; patient presents for follow-up. He has no issues or complaints 4/11; 1 week follow-up. The patient appears to be doing very well with the wound on the right medial lower leg. We are using Hydrofera Blue and 3 layer compression. The patient has sarcoidosis and is on chronic prednisone he is convinced that both of these have caused this wound although it certainly looks like this is chronic venous insufficiency with developing lymphedema.He has never worn compression stockings. 4/18; patient presents for follow-up. He has no issues or complaints today. He has not ordered compression stockings yet but states he will do this this week. 4/25; patient presents for follow-up. He has not ordered his compression stockings. He has the information to do so. He tolerated the compression wrap well. He has no issues or complaints today. 06/06/2022 Anthony [name] is a 42 year old male with a past medical history of venous insufficiency previously treated in our clinic last year for right lower extremity wound. He again presents with a similar wound that started spontaneously 2 months ago. He has been keeping the area covered. It sounds like he is never order the compression stockings that were recommended at last clinic admission. We gave him information today to elastic therapy to order these. 2/2; patient presents for follow-up. We were using antibiotic  ointment with Hydrofera Blue under 3 layer compression. Unfortunately he states that the wrap slid down 2 days after it was placed. He did not let us know. He has been keeping the area covered. 2/8; patient presents for follow-up. We have been using Santyl and Hydrofera Blue under 4-layer compression to the right lower extremity. One of the two wounds has healed. He tolerated the wrap well but still slid down some this week. Patient History Information obtained from Patient. Family History Cancer - Maternal Grandparents, Diabetes - Father, Heart Disease - Father, Hypertension -  Father, Stroke - Father, No family history of Hereditary Spherocytosis, Kidney Disease, Lung Disease, Seizures, Thyroid Problems, Tuberculosis. Social History Former smoker - Quit 2017, Marital Status - Married, Alcohol Use - Moderate, Drug Use - No History, Caffeine Use - Daily. Medical History Respiratory Patient has history of Sleep Apnea Cardiovascular Anthony [name], Anthony [name] ([id number]) [id number] Page 5 of 8 Patient has history of Hypertension Musculoskeletal Patient has history of Rheumatoid Arthritis, Osteoarthritis - knee Medical A Surgical History Notes nd Respiratory Sarcoidosis of lung Cardiovascular Mediastinal lymphadenopathy Immunological Sarcoidosis Objective Constitutional respirations regular, non-labored and within target range for patient.. Vitals Time Taken: 12:48 PM, Height: 68 in, Weight: 574 lbs, BMI: 87.3, Temperature: 98.5 F, Pulse: 76 bpm, Respiratory Rate: 18 breaths/min, Blood Pressure: 151/71 mmHg. Cardiovascular 2+ dorsalis pedis/posterior tibialis pulses. Psychiatric pleasant and cooperative. General Notes: Right lower extremity: T the medial aspect there is one open wound with granulation tissue throughout. Epithelialization to the other wound just o distal to the open wound. Stage III lymphedema. Integumentary (Hair, Skin) Wound #2 status is  Open. Original cause of wound was Gradually Appeared. The date acquired was: 04/03/2022. The wound has been in treatment 2 weeks. The wound is located on the Right,Proximal,Medial Lower Leg. The wound measures 0.5cm length x 0.6cm width x 0.1cm depth; 0.236cm^2 area and 0.024cm^3 volume. There is Fat Layer (Subcutaneous Tissue) exposed. There is no tunneling or undermining noted. There is a medium amount of serosanguineous drainage noted. The wound margin is distinct with the outline attached to the wound base. There is large (67-100%) red, hyper - granulation within the wound bed. There is a small (1-33%) amount of necrotic tissue within the wound bed including Adherent Slough. The periwound skin appearance exhibited: Hemosiderin Staining. The periwound skin appearance did not exhibit: Callus, Crepitus, Excoriation, Induration, Rash, Scarring, Dry/Scaly, Maceration, Atrophie Blanche, Cyanosis, Ecchymosis, Mottled, Pallor, Rubor, Erythema. Wound #3 status is Healed - Epithelialized. Original cause of wound was Gradually Appeared. The date acquired was: 04/03/2022. The wound has been in treatment 2 weeks. The wound is located on the Right,Distal,Medial Lower Leg. The wound measures 0cm length x 0cm width x 0cm depth; 0cm^2 area and 0cm^3 volume. There is no tunneling or undermining noted. There is a none present amount of drainage noted. The wound margin is distinct with the outline attached to the wound base. There is no granulation within the wound bed. There is no necrotic tissue within the wound bed. The periwound skin appearance exhibited: Hemosiderin Staining. The periwound skin appearance did not exhibit: Callus, Crepitus, Excoriation, Induration, Rash, Scarring, Dry/Scaly, Maceration, Atrophie Blanche, Cyanosis, Ecchymosis, Mottled, Pallor, Rubor, Erythema. 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 The remaining wound appears well-healing. I recommended continuing the course with Santyl and Hydrofera Blue under 4-layer compression. Follow-up in 1 week. Procedures Wound #2 Pre-procedure diagnosis of Wound #2 is a Venous Leg Ulcer located on the Right,Proximal,Medial Lower Leg . There was a Four Layer Compression Therapy Procedure by Sharyn Creamer, RN. Post procedure Diagnosis Wound #2: Same as Pre-Procedure Anthony [name], Anthony [name] ([id number]) [phone].pdf Page 6 of 8 Plan Follow-up Appointments: Return Appointment in 1 week. - Dr Heber [place] 06/28/2022 0930 Friday Room 8 Return Appointment in 2 weeks. - Dr. Heber [place] (needs to schedule) Anesthetic: Wound #2 Right,Proximal,Medial Lower Leg: (In clinic) Topical  Lidocaine 5% applied to wound bed Bathing/ Shower/ Hygiene: May shower with protection but do not get wound dressing(s) wet. Protect dressing(s) with water repellant cover (for example, large plastic bag) or a cast cover and may then take shower. - cast protector may be purchased from [facility], [facility] or [employer] Edema Control - Lymphedema / SCD / Other: Elevate legs to the level of the heart or above for 30 minutes daily and/or when sitting for 3-4 times a day throughout the day. Avoid standing for long periods of time. Exercise regularly The following medication(s) was prescribed: lidocaine topical 5 % ointment ointment topical once daily for prior to debridement was prescribed at facility WOUND #2: - Lower Leg Wound Laterality: Right, Medial, Proximal Cleanser: Soap and Water 1 x Per Week/15 Days Discharge Instructions: May shower and wash wound with dial antibacterial soap and water prior to dressing change. Peri-Wound Care: Sween Lotion (Moisturizing lotion) 1 x Per Week/15 Days Discharge Instructions: Apply  moisturizing lotion as directed Topical: zinc at the top of lower portion of leg to secure wrap in place. 1 x Per Week/15 Days Discharge Instructions: see product. Prim Dressing: Hydrofera Blue Ready Transfer Foam, 2.5x2.5 (in/in) 1 x Per Week/15 Days ary Discharge Instructions: Apply directly to wound bed as directed Prim Dressing: Santyl Ointment 1 x Per Week/15 Days ary Discharge Instructions: Apply nickel thick amount to wound bed as instructed Secondary Dressing: Woven Gauze Sponge, Non-Sterile 4x4 in 1 x Per Week/15 Days Discharge Instructions: Apply over primary dressing as directed. Com pression Wrap: FourPress (4 layer compression wrap) 1 x Per Week/15 Days Discharge Instructions: Apply four layer compression as directed. May also use URGO K2. Com pression Wrap: Urgo K2, two layer compression system, regular 1 x Per Week/15 Days Discharge Instructions: Apply Urgo K2 as directed (alternative to 4 layer compression). Com pression Wrap: Unnaboot w/Calamine, 4x10 (in/yd) 1 x Per Week/15 Days Discharge Instructions: Apply Unnaboot as directed. 1. Santyl and Hydrofera Blue under 4-layer compressionooright lower extremity 2. Follow-up in 1 week Electronic Signature(s) Signed: 06/24/2022 12:24:04 PM By: Deon Pilling RN, BSN Signed: 06/24/2022 1:15:42 PM By: Kalman Shan DO Previous Signature: 06/20/2022 3:04:17 PM Version By: Kalman Shan DO Entered By: Deon Pilling on 06/24/2022 12:21:59 -------------------------------------------------------------------------------- HxROS Details Patient Name: Date of Service: Anthony Deeds RO N J. 06/20/2022 12:30 PM Medical Record Number: [id number] Patient Account Number: [id number] Date of Birth/Sex: Treating RN: [birth year] (42 y.o. M) Primary Care Provider: PCP, NO Other Clinician: Referring Provider: Treating Provider/Extender: Yaakov Guthrie in Treatment: 2 Information Obtained From Patient Respiratory Medical  History: Positive for: Sleep Apnea Past Medical History Notes: Sarcoidosis of lung Cardiovascular Medical History: Positive for: Hypertension Past Medical History Notes: Mediastinal lymphadenopathy Anthony [name], Anthony [name] ([id number] Page 7 of 8 Immunological Medical History: Past Medical History Notes: Sarcoidosis Musculoskeletal Medical History: Positive for: Rheumatoid Arthritis; Osteoarthritis - knee Immunizations Pneumococcal Vaccine: Received Pneumococcal Vaccination: No Implantable Devices None Family and Social History Cancer: Yes - Maternal Grandparents; Diabetes: Yes - Father; Heart Disease: Yes - Father; Hereditary Spherocytosis: No; Hypertension: Yes - Father; Kidney Disease: No; Lung Disease: No; Seizures: No; Stroke: Yes - Father; Thyroid Problems: No; Tuberculosis: No; Former smoker - Quit 2017; Marital Status - Married; Alcohol Use: Moderate; Drug Use: No History; Caffeine Use: Daily; Financial Concerns: No; Food, Clothing or Shelter Needs: No; Support System Lacking: No; Transportation Concerns: No Electronic Signature(s) Signed: 06/20/2022 3:04:17 PM By: Kalman Shan DO Entered By: Kalman Shan on 06/20/2022 13:36:45 -------------------------------------------------------------------------------- SuperBill Details Patient Name:  Date of Service: [place], SEGREST 06/20/2022 Medical Record Number: [id number] Patient Account Number: [id number] Date of Birth/Sex: Treating RN: 19-May-1980 (42 y.o. 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 Facility  Procedures : CPT4 Code: [id number] Description: (Facility Use Only) [facility] LWR RT LEG Modifier: Quantity: 1 Physician Procedures : CPT4 Code Description Modifier [id number] - WC PHYS LEVEL 3 - EST PT ICD-10 Diagnosis Description I87.311 Chronic venous hypertension (idiopathic) with ulcer of right lower extremity L97.812 Non-pressure chronic ulcer of other part of right lower  leg with fat layer exposed 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

## 2022-06-24 ENCOUNTER — Other Ambulatory Visit: Payer: Self-pay | Admitting: Emergency Medicine

## 2022-06-24 NOTE — Telephone Encounter (Signed)
Please call the patient to confirm his current dose prednisone so we can refill. At his last office visit we discussed decreasing him to 18m daily. Thanks.

## 2022-06-24 NOTE — Telephone Encounter (Signed)
ATC pt LVM for him to call us back to confirm current dosage.

## 2022-06-25 ENCOUNTER — Encounter: Payer: Self-pay | Admitting: Registered Nurse

## 2022-06-25 ENCOUNTER — Encounter: Payer: Commercial Managed Care - HMO | Attending: Physical Medicine and Rehabilitation | Admitting: Registered Nurse

## 2022-06-25 VITALS — BP 117/77 | HR 74 | Ht 68.0 in | Wt >= 6400 oz

## 2022-06-25 DIAGNOSIS — M25561 Pain in right knee: Secondary | ICD-10-CM | POA: Insufficient documentation

## 2022-06-25 DIAGNOSIS — M48062 Spinal stenosis, lumbar region with neurogenic claudication: Secondary | ICD-10-CM | POA: Diagnosis present

## 2022-06-25 DIAGNOSIS — G894 Chronic pain syndrome: Secondary | ICD-10-CM | POA: Diagnosis present

## 2022-06-25 DIAGNOSIS — Z79891 Long term (current) use of opiate analgesic: Secondary | ICD-10-CM | POA: Insufficient documentation

## 2022-06-25 DIAGNOSIS — M25562 Pain in left knee: Secondary | ICD-10-CM | POA: Insufficient documentation

## 2022-06-25 DIAGNOSIS — G8929 Other chronic pain: Secondary | ICD-10-CM | POA: Diagnosis present

## 2022-06-25 DIAGNOSIS — M546 Pain in thoracic spine: Secondary | ICD-10-CM | POA: Diagnosis present

## 2022-06-25 DIAGNOSIS — Z5181 Encounter for therapeutic drug level monitoring: Secondary | ICD-10-CM | POA: Diagnosis present

## 2022-06-25 MED ORDER — OXYCODONE-ACETAMINOPHEN 10-325 MG PO TABS: 1.0000 | ORAL_TABLET | Freq: Four times a day (QID) | ORAL | 0 refills | Status: DC | PRN

## 2022-06-25 NOTE — Progress Notes (Signed)
Subjective:    Patient ID: Anthony [name], male    DOB: [birth year], 42 y.o.   MRN: [id number]  HPI: Anthony [name] is a 42 y.o. male who returns for follow up appointment for chronic pain and medication refill. He states his pain is located in his upeer- lower back and bilateral knee pain. He rates his pain 7. His current exercise regime is walking and performing stretching exercises.  Anthony [name] Morphine equivalent is 60.00 MME.   UDS ordered today.     Pain Inventory Average Pain 7 Pain Right Now 7 My pain is intermittent, constant, sharp, burning, dull, stabbing, and aching  In the last 24 hours, has pain interfered with the following? General activity 10 Relation with others 10 Enjoyment of life 10 What TIME of day is your pain at its worst? morning , daytime, evening, and night Sleep (in general) Poor  Pain is worse with: walking, bending, sitting, inactivity, and standing Pain improves with: rest, heat/ice, and medication Relief from Meds: 4  Family History  Problem Relation Age of Onset   Hypertension Father    Diabetes Father    Stroke Father    Congestive Heart Failure Father    Lymphoma Paternal Uncle    Ovarian cancer Maternal Grandmother 44   Social History   Socioeconomic History   Marital status: Married    Spouse name: Not on file   Number of children: Not on file   Years of education: Not on file   Highest education level: Not on file  Occupational History   Occupation: Disabled  Tobacco Use   Smoking status: Former    Packs/day: 0.30    Years: 10.00    Total pack years: 3.00    Types: Cigarettes    Quit date: 09/30/2016    Years since quitting: 5.7    Passive exposure: Never   Smokeless tobacco: Never  Vaping Use   Vaping Use: Never used  Substance and Sexual Activity   Alcohol use: No   Drug use: No   Sexual activity: Yes    Birth control/protection: Condom  Other Topics Concern   Not on file  Social History Narrative   Not on  file   Social Determinants of Health   Financial Resource Strain: Not on file  Food Insecurity: Not on file  Transportation Needs: Not on file  Physical Activity: Not on file  Stress: Not on file  Social Connections: Not on file   Past Surgical History:  Procedure Laterality Date   ARTHROSCOPY KNEE W/ DRILLING     right knee    ENDOBRONCHIAL ULTRASOUND Bilateral 03/04/2017   Procedure: ENDOBRONCHIAL ULTRASOUND;  Surgeon: Collene Gobble, MD;  Location: [facility];  Service: Cardiopulmonary;  Laterality: Bilateral;   left knee inner growth plate removed     to straighten leg   Past Surgical History:  Procedure Laterality Date   ARTHROSCOPY KNEE W/ DRILLING     right knee    ENDOBRONCHIAL ULTRASOUND Bilateral 03/04/2017   Procedure: ENDOBRONCHIAL ULTRASOUND;  Surgeon: Collene Gobble, MD;  Location: [facility];  Service: Cardiopulmonary;  Laterality: Bilateral;   left knee inner growth plate removed     to straighten leg   Past Medical History:  Diagnosis Date   Anxiety    Depression    Dyspnea    Eczema of hand    Headache    hx of with tooth pain   Obese    Pneumonia    12/2016  Pre-diabetes    Sarcoidosis    Sleep apnea    cpap   BP 117/77   Pulse 74   Ht 5' 8"$  (1.727 m)   Wt (!) 563 lb (255.4 kg)   SpO2 94%   BMI 85.60 kg/m   Opioid Risk Score:   Fall Risk Score:  `1  Depression screen [facility] 2/9     05/27/2022    9:09 AM 04/30/2022    2:35 PM 03/27/2022    9:06 AM 02/25/2022    8:43 AM 01/29/2022    8:38 AM 12/31/2021    9:02 AM 11/29/2021    8:57 AM  Depression screen PHQ 2/9  Decreased Interest 0 0 0 0 0 0 0  Down, Depressed, Hopeless 0 0 0 0 0 0 0  PHQ - 2 Score 0 0 0 0 0 0 0     Review of Systems  Musculoskeletal:  Positive for back pain.       B/L knee pain  Back of right knee Interior right leg        Objective:   Physical Exam Vitals and nursing note reviewed.  Constitutional:      Appearance: Normal appearance.   Cardiovascular:     Rate and Rhythm: Normal rate and regular rhythm.     Pulses: Normal pulses.     Heart sounds: Normal heart sounds.  Pulmonary:     Effort: Pulmonary effort is normal.     Breath sounds: Normal breath sounds.  Musculoskeletal:     Cervical back: Normal range of motion and neck supple.     Comments: Normal Muscle Bulk and Muscle Testing Reveals:  Upper Extremities: Full ROM and Muscle Strength 5/5 Thoracic Paraspinal Tenderness: T-3-T-4 Lumbar Paraspinal Tenderness: L-4-L-5 Lower Extremities: Full ROM and Muscle Strength 5/5 Bilateral Lower Extremities Flexion Produces Pain intp his Bilateral Patella's Arises from Table Slowly Antalgic  Gait     Skin:    General: Skin is warm and dry.  Neurological:     Mental Status: He is alert and oriented to person, place, and time.  Psychiatric:        Mood and Affect: Mood normal.        Behavior: Behavior normal.         Assessment & Plan:  1.Right Lumbar Radiculitis/ Spinal Stenosis : Continue HEP as Tolerated and Continue to Monitor. 06/25/2022 Refilled: :Oxycodone 10/325 mg one tablet 4 times a day as needed for pain #120. We will continue the opioid monitoring program, this consists of regular clinic visits, examinations, urine drug screen, pill counts as well as use of [place] Controlled Substance Reporting system. A 12 month History has been reviewed on the [place] on 06/25/2022.  2. Thoracic Back Pain: No complaints today.Continue HEP as Tolerated . Continue to Monitor. 06/25/2022 3. Right Shoulder Pain: No Complaints today. Continue HEP as Tolerated and Continue to Monitor.[id number] 4. Chronic Pain Syndrome: Continue HEP as Tolerated. Continue to Monitor. 06/25/2022 5. Morbid Obesity: Continue Healthy Diet Regimen. Continue to Monitor. 06/25/2022 6. Chronic Bilateral Knee Pain:Continue HEP as Tolerated. Continue to Monitor. 06/25/2022 7. Left Ankle Pain: No  complaints today.  Ortho Following.    F/U in 1 Month

## 2022-06-27 ENCOUNTER — Ambulatory Visit (HOSPITAL_BASED_OUTPATIENT_CLINIC_OR_DEPARTMENT_OTHER): Payer: Commercial Managed Care - HMO | Admitting: Internal Medicine

## 2022-06-28 ENCOUNTER — Encounter (HOSPITAL_BASED_OUTPATIENT_CLINIC_OR_DEPARTMENT_OTHER): Payer: Commercial Managed Care - HMO | Admitting: Internal Medicine

## 2022-06-28 DIAGNOSIS — I87311 Chronic venous hypertension (idiopathic) with ulcer of right lower extremity: Secondary | ICD-10-CM

## 2022-06-28 DIAGNOSIS — L97812 Non-pressure chronic ulcer of other part of right lower leg with fat layer exposed: Secondary | ICD-10-CM | POA: Diagnosis not present

## 2022-06-28 LAB — TOXASSURE SELECT,+ANTIDEPR,UR

## 2022-06-30 NOTE — Progress Notes (Signed)
AMILIANO, PEIFFER ([id number]) [phone].pdf Page 1 of 8 Visit Report for 06/28/2022 Chief Complaint Document Details Patient Name: Date of Service: Anthony [name], Anthony [name] 06/28/2022 9:30 A M Medical Record Number: [id number] Patient Account Number: [id number] Date of Birth/Sex: Treating RN: 1981-01-17 (42 y.o. M) Primary Care Provider: PCP, NO Other Clinician: Referring Provider: Treating Provider/Extender: Yaakov Guthrie in Treatment: 3 Information Obtained from: Patient Chief Complaint Right lower extremity wound Electronic Signature(s) Signed: 06/28/2022 11:14:04 AM By: Kalman Shan DO Entered By: Kalman Shan on 06/28/2022 10:15:47 -------------------------------------------------------------------------------- Debridement Details Patient Name: Date of Service: Anthony Deeds RO N J. 06/28/2022 9:30 A M Medical Record Number: [id number] Patient Account Number: [id number] Date of Birth/Sex: Treating RN: 01/05/1981 (42 y.o. Hessie Diener Primary Care Provider: PCP, NO Other Clinician: Referring Provider: Treating Provider/Extender: Yaakov Guthrie in Treatment: 3 Debridement Performed for Assessment: Wound #2 Right,Proximal,Medial Lower Leg Performed By: Physician Kalman Shan, DO Debridement Type: Debridement Severity of Tissue Pre Debridement: Fat layer exposed Level of Consciousness (Pre-procedure): Awake and Alert Pre-procedure Verification/Time Out Yes - 10:00 Taken: Start Time: 10:01 Pain Control: Lidocaine 5% topical ointment T Area Debrided (L x W): otal 1 (cm) x 1 (cm) = 1 (cm) Tissue and other material debrided: Viable, Non-Viable, Slough, Skin: Dermis , Skin: Epidermis, Slough Level: Skin/Epidermis Debridement Description: Selective/Open Wound Instrument: Curette Bleeding: Minimum Hemostasis Achieved: Pressure End Time: 10:07 Procedural Pain: 0 Post Procedural Pain: 0 Response to Treatment: Procedure was  tolerated well Level of Consciousness (Post- Awake and Alert procedure): Post Debridement Measurements of Total Wound Length: (cm) 1 Width: (cm) 0.5 Depth: (cm) 0.1 Volume: (cm) 0.039 Character of Wound/Ulcer Post Debridement: Improved Severity of Tissue Post Debridement: Fat layer exposed [name] ([id number]) [phone].pdf Page 2 of 8 Post Procedure Diagnosis Same as Pre-procedure Electronic Signature(s) Signed: 06/28/2022 10:13:15 AM By: Kalman Shan DO Signed: 06/30/2022 2:19:36 PM By: Deon Pilling RN, BSN Entered By: Deon Pilling on 06/28/2022 10:07:54 -------------------------------------------------------------------------------- HPI Details Patient Name: Date of Service: Anthony Deeds RO N J. 06/28/2022 9:30 A M Medical Record Number: [id number] Patient Account Number: [id number] Date of Birth/Sex: Treating RN: July 14, 1980 (42 y.o. M) Primary Care Provider: PCP, NO Other Clinician: Referring Provider: Treating Provider/Extender: Yaakov Guthrie in Treatment: 3 History of Present Illness HPI Description: Admission 07/31/2021 [name] is a 42 year old male with a past medical history of sarcoidosis, obstructive sleep apnea and morbid obesity that presents to the clinic for a 1 year history of wounds to his right lower extremity. He states these wax and wane in size. He states that it currently does not drain but has weeped in the past. He reports taking amoxicillin last month and this helped improve the wound size and appearance. He does not have compression stockings. He currently denies signs of infection. 3/28; patient presents for follow-up. He tolerated the compression wrap well. He has no issues or complaints today. He denies signs of infection. 4/4; patient presents for follow-up. He has no issues or complaints 4/11; 1 week follow-up. The patient appears to be doing very well with the wound on the right medial lower  leg. We are using Hydrofera Blue and 3 layer compression. The patient has sarcoidosis and is on chronic prednisone he is convinced that both of these have caused this wound although it certainly looks like this is chronic venous insufficiency with developing lymphedema.He has never worn compression stockings. 4/18; patient presents for follow-up. He has no issues or complaints today. He has  not ordered compression stockings yet but states he will do this this week. 4/25; patient presents for follow-up. He has not ordered his compression stockings. He has the information to do so. He tolerated the compression wrap well. He has no issues or complaints today. 06/06/2022 [name] is a 42 year old male with a past medical history of venous insufficiency previously treated in our clinic last year for right lower extremity wound. He again presents with a similar wound that started spontaneously 2 months ago. He has been keeping the area covered. It sounds like he is never order the compression stockings that were recommended at last clinic admission. We gave him information today to elastic therapy to order these. 2/2; patient presents for follow-up. We were using antibiotic ointment with Hydrofera Blue under 3 layer compression. Unfortunately he states that the wrap slid down 2 days after it was placed. He did not let us know. He has been keeping the area covered. 2/8; patient presents for follow-up. We have been using Santyl and Hydrofera Blue under 4-layer compression to the right lower extremity. One of the two wounds has healed. He tolerated the wrap well but still slid down some this week. 2/16; patient presents for follow-up. We have been using Santyl and Hydrofera Blue under 4-layer compression to the right lower extremity. He has no issues or complaints today. Electronic Signature(s) Signed: 06/28/2022 11:14:04 AM By: Kalman Shan DO Entered By: Kalman Shan on 06/28/2022  10:16:11 -------------------------------------------------------------------------------- Physical Exam Details Patient Name: Date of Service: Anthony Deeds RO N J. 06/28/2022 9:30 A M Medical Record Number: [id number] Patient Account Number: [id number] Date of Birth/Sex: Treating RN: 03-29-1981 (42 y.o. M) Primary Care Provider: PCP, NO Other ClinicianDAVONN, HAMMONTREE ([id number]) [id number] Page 3 of 8 Referring Provider: Treating Provider/Extender: Yaakov Guthrie in Treatment: 3 Constitutional respirations regular, non-labored and within target range for patient.. Cardiovascular 2+ dorsalis pedis/posterior tibialis pulses. Psychiatric pleasant and cooperative. Notes Right lower extremity: T the medial aspect there is one open wound with granulation tissue And nonviable tissue. Epithelialization to the other wound just distal o to the open wound. Stage III lymphedema. Electronic Signature(s) Signed: 06/28/2022 11:14:04 AM By: Kalman Shan DO Entered By: Kalman Shan on 06/28/2022 10:16:36 -------------------------------------------------------------------------------- Physician Orders Details Patient Name: Date of Service: Anthony Deeds RO N J. 06/28/2022 9:30 A M Medical Record Number: [id number] Patient Account Number: [id number] Date of Birth/Sex: Treating RN: 06-12-1980 (42 y.o. 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 - Dr. Heber [place] 07/05/2022 0845 Friday Return  A ppointment in 2 weeks. - Dr .Heber [place] 07/12/2022 0930 Friday Return A Anesthetic Wound #2 Right,Proximal,Medial Lower Leg (In clinic) Topical Lidocaine 5% applied to wound bed Bathing/ Shower/ Hygiene May shower with protection but do not get wound dressing(s) wet. Protect dressing(s) with water repellant cover (for example, large plastic bag) or a cast cover and may then take shower. - cast protector may be purchased from [facility], [facility] or [employer] Edema Control - Lymphedema / SCD / Other Elevate legs to the level of the heart or above for 30 minutes daily and/or when sitting for 3-4 times  a day throughout the day. Avoid standing for long periods of time. Exercise regularly Wound Treatment Wound #2 - Lower Leg Wound Laterality: Right, Medial, Proximal Cleanser: Soap and Water 1 x Per Week/15 Days Discharge Instructions: May shower and wash wound with dial antibacterial soap and water prior to dressing change. Peri-Wound Care: Sween Lotion (Moisturizing lotion) 1 x Per Week/15 Days Discharge Instructions: Apply moisturizing lotion as directed Topical: Gentamicin 1 x Per Week/15 Days Discharge Instructions: As directed by physician [name] ([id number]) [id number] Page 4 of 8 Topical: Mupirocin Ointment 1 x Per Week/15 Days Discharge Instructions: Apply Mupirocin (Bactroban) as instructed Topical: zinc at the top of lower portion of leg to secure wrap in place. 1 x Per Week/15 Days Discharge Instructions: see product. Prim Dressing: PolyMem Silver Non-Adhesive Dressing, 4.25x4.25 in 1 x Per Week/15 Days ary Discharge Instructions: Apply to wound bed as instructed Secondary Dressing: Woven Gauze Sponge, Non-Sterile 4x4 in 1 x Per Week/15 Days Discharge Instructions: Apply over primary dressing as directed. Secondary Dressing: Zetuvit Plus Silicone Border Dressing 4x4 (in/in) 1 x Per Week/15 Days Discharge Instructions: Apply silicone border over primary  dressing as directed. Compression Wrap: FourPress (4 layer compression wrap) 1 x Per Week/15 Days Discharge Instructions: Apply four layer compression as directed. May also use URGO K2. Electronic Signature(s) Signed: 06/28/2022 11:14:04 AM By: Kalman Shan DO Previous Signature: 06/28/2022 10:13:15 AM Version By: Kalman Shan DO Entered By: Kalman Shan on 06/28/2022 10:18:59 -------------------------------------------------------------------------------- Problem List Details Patient Name: Date of Service: Anthony Deeds RO N J. 06/28/2022 9:30 A M Medical Record Number: [id number] Patient Account Number: [id number] Date of Birth/Sex: Treating RN: [birth year] (42 y.o. 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 06/06/2022 No Yes Inactive Problems Resolved Problems Electronic Signature(s) Signed: 06/28/2022 11:14:04 AM By: Kalman Shan DO Previous Signature: 06/28/2022 10:13:15 AM Version By: Kalman Shan DO Entered By: Kalman Shan on 06/28/2022 10:15:35 [name] ([id number]) [id number] Page 5 of 8 -------------------------------------------------------------------------------- Progress Note Details Patient Name: Date of Service: ALONSO, WELSH 06/28/2022 9:30 A M Medical Record Number: [id number] Patient Account Number: [id number] Date of Birth/Sex: Treating RN: 02-22-1981 (42 y.o. M) Primary Care Provider: PCP, NO Other Clinician: Referring Provider: Treating  Provider/Extender: Yaakov Guthrie in Treatment: 3 Subjective Chief Complaint Information obtained from Patient Right lower extremity wound History of Present Illness (HPI) Admission 07/31/2021 [name] is a 42 year old male with a past medical history of sarcoidosis, obstructive sleep apnea and morbid obesity that presents to the clinic for a 1 year history of wounds to his right lower extremity. He states these wax and wane in size. He states that it currently does not drain but has weeped in the past. He reports taking amoxicillin last month and this helped improve the wound size and appearance. He does not have compression stockings. He currently denies signs of infection. 3/28; patient presents for follow-up. He tolerated the compression wrap well. He has no issues or complaints today. He denies signs of infection. 4/4; patient presents for follow-up. He has no issues or complaints 4/11; 1 week follow-up. The patient appears to  be doing very well with the wound on the right medial lower leg. We are using Hydrofera Blue and 3 layer compression. The patient has sarcoidosis and is on chronic prednisone he is convinced that both of these have caused this wound although it certainly looks like this is chronic venous insufficiency with developing lymphedema.He has never worn compression stockings. 4/18; patient presents for follow-up. He has no issues or complaints today. He has not ordered compression stockings yet but states he will do this this week. 4/25; patient presents for follow-up. He has not ordered his compression stockings. He has the information to do so. He tolerated the compression wrap well. He has no issues or complaints today. 06/06/2022 Mr. Lamarian Anstey is a 42 year old male with a past medical history of venous insufficiency previously treated in our clinic last year for right lower extremity wound. He again presents with a similar wound that started  spontaneously 2 months ago. He has been keeping the area covered. It sounds like he is never order the compression stockings that were recommended at last clinic admission. We gave him information today to elastic therapy to order these. 2/2; patient presents for follow-up. We were using antibiotic ointment with Hydrofera Blue under 3 layer compression. Unfortunately he states that the wrap slid down 2 days after it was placed. He did not let us know. He has been keeping the area covered. 2/8; patient presents for follow-up. We have been using Santyl and Hydrofera Blue under 4-layer compression to the right lower extremity. One of the two wounds has healed. He tolerated the wrap well but still slid down some this week. 2/16; patient presents for follow-up. We have been using Santyl and Hydrofera Blue under 4-layer compression to the right lower extremity. He has no issues or complaints today. Patient History Information obtained from Patient. Family History Cancer - Maternal Grandparents, Diabetes - Father, Heart Disease - Father, Hypertension - Father, Stroke - Father, No family history of Hereditary Spherocytosis, Kidney Disease, Lung Disease, Seizures, Thyroid Problems, Tuberculosis. Social History Former smoker - Quit 2017, Marital Status - Married, Alcohol Use - Moderate, Drug Use - No History, Caffeine Use - Daily. Medical History Respiratory Patient has history of Sleep Apnea Cardiovascular Patient has history of Hypertension Musculoskeletal Patient has history of Rheumatoid Arthritis, Osteoarthritis - knee Medical A Surgical History Notes nd Respiratory Sarcoidosis of lung Cardiovascular Mediastinal lymphadenopathy Immunological Sarcoidosis MUKUL, KRAHMER ([id number]) [id number] Page 6 of 8 Objective Constitutional respirations regular, non-labored and within target range for patient.. Vitals Time Taken: 9:50 AM, Height: 68 in, Weight: 574 lbs,  BMI: 87.3, Temperature: 98.2 F, Pulse: 73 bpm, Respiratory Rate: 20 breaths/min, Blood Pressure: 146/85 mmHg. Cardiovascular 2+ dorsalis pedis/posterior tibialis pulses. Psychiatric pleasant and cooperative. General Notes: Right lower extremity: T the medial aspect there is one open wound with granulation tissue And nonviable tissue. Epithelialization to the other o wound just distal to the open wound. Stage III lymphedema. Integumentary (Hair, Skin) Wound #2 status is Open. Original cause of wound was Gradually Appeared. The date acquired was: 04/03/2022. The wound has been in treatment 3 weeks. The wound is located on the Right,Proximal,Medial Lower Leg. The wound measures 0.4cm length x 0.5cm width x 0.1cm depth; 0.157cm^2 area and 0.016cm^3 volume. There is Fat Layer (Subcutaneous Tissue) exposed. There is no tunneling or undermining noted. There is a medium amount of serosanguineous drainage noted. The wound margin is distinct with the outline attached to the wound base. There is large (67-100%) red, hyper -  granulation within the wound bed. There is a small (1-33%) amount of necrotic tissue within the wound bed including Eschar. The periwound skin appearance exhibited: Hemosiderin Staining. The periwound skin appearance did not exhibit: Callus, Crepitus, Excoriation, Induration, Rash, Scarring, Dry/Scaly, Maceration, Atrophie Blanche, Cyanosis, Ecchymosis, Mottled, Pallor, Rubor, Erythema. 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 I debrided nonviable tissue. I recommended switching the dressing to PolyMem silver with antibiotic ointment and continuing 4-layer compression. Follow-up in 1 week. Procedures Wound #2 Pre-procedure diagnosis of Wound #2 is a  Venous Leg Ulcer located on the Right,Proximal,Medial Lower Leg .Severity of Tissue Pre Debridement is: Fat layer exposed. There was a Selective/Open Wound Skin/Epidermis Debridement with a total area of 1 sq cm performed by Kalman Shan, DO. With the following instrument(s): Curette to remove Viable and Non-Viable tissue/material. Material removed includes Slough, Skin: Dermis, and Skin: Epidermis after achieving pain control using Lidocaine 5% topical ointment. A time out was conducted at 10:00, prior to the start of the procedure. A Minimum amount of bleeding was controlled with Pressure. The procedure was tolerated well with a pain level of 0 throughout and a pain level of 0 following the procedure. Post Debridement Measurements: 1cm length x 0.5cm width x 0.1cm depth; 0.039cm^3 volume. Character of Wound/Ulcer Post Debridement is improved. Severity of Tissue Post Debridement is: Fat layer exposed. Post procedure Diagnosis Wound #2: Same as Pre-Procedure Pre-procedure diagnosis of Wound #2 is a Venous Leg Ulcer located on the Right,Proximal,Medial Lower Leg . There was a Four Layer Compression Therapy Procedure by Deon Pilling, RN. Post procedure Diagnosis Wound #2: Same as Pre-Procedure Plan Follow-up Appointments: Return Appointment in 1 week. - Dr. Heber [place] 07/05/2022 0845 Friday Return Appointment in 2 weeks. - Dr .Heber [place] 07/12/2022 0930 Friday Anesthetic: Wound #2 Right,Proximal,Medial Lower Leg: (In clinic) Topical Lidocaine 5% applied to wound bed Bathing/ Shower/ Hygiene: May shower with protection but do not get wound dressing(s) wet. Protect dressing(s) with water repellant cover (for example, large plastic bag) or a cast cover and may then take shower. - cast protector may be purchased from [facility], [facility] or [employer] ([id number]) [id number] Page 7 of 8 Edema Control - Lymphedema / SCD / Other: Elevate legs to the level of the heart  or above for 30 minutes daily and/or when sitting for 3-4 times a day throughout the day. Avoid standing for long periods of time. Exercise regularly WOUND #2: - Lower Leg Wound Laterality: Right, Medial, Proximal Cleanser: Soap and Water 1 x Per Week/15 Days Discharge Instructions: May shower and wash wound with dial antibacterial soap and water prior to dressing change. Peri-Wound Care: Sween Lotion (Moisturizing lotion) 1 x Per Week/15 Days Discharge Instructions: Apply moisturizing lotion as directed Topical: Gentamicin 1 x Per Week/15 Days Discharge Instructions: As directed by physician Topical: Mupirocin Ointment 1 x Per Week/15 Days Discharge Instructions: Apply Mupirocin (Bactroban) as instructed Topical: zinc at the top of lower portion of leg to secure wrap in place. 1 x Per Week/15 Days Discharge Instructions: see product. Prim Dressing: PolyMem Silver Non-Adhesive Dressing, 4.25x4.25 in 1 x Per Week/15 Days ary Discharge Instructions: Apply to wound bed as instructed Secondary Dressing: Woven Gauze Sponge, Non-Sterile 4x4 in 1 x Per Week/15 Days Discharge Instructions: Apply over primary dressing as directed. Secondary Dressing: Zetuvit Plus Silicone  Border Dressing 4x4 (in/in) 1 x Per Week/15 Days Discharge Instructions: Apply silicone border over primary dressing as directed. Com pression Wrap: FourPress (4 layer compression wrap) 1 x Per Week/15 Days Discharge Instructions: Apply four layer compression as directed. May also use URGO K2. 1. In office sharp debridement 2. PolyMem silver with antibiotic ointment 3. 4-layer compressionooright lower extremity 4. Follow-up in 1 week Electronic Signature(s) Signed: 06/28/2022 11:14:04 AM By: Kalman Shan DO Entered By: Kalman Shan on 06/28/2022 10:53:24 -------------------------------------------------------------------------------- HxROS Details Patient Name: Date of Service: Anthony Deeds RO N J. 06/28/2022 9:30 A  M Medical Record Number: [id number] Patient Account Number: [id number] Date of Birth/Sex: Treating RN: 01-Feb-1981 (42 y.o. M) Primary Care Provider: PCP, NO Other Clinician: Referring Provider: Treating Provider/Extender: Yaakov Guthrie in Treatment: 3 Information Obtained From Patient Respiratory Medical History: Positive for: Sleep Apnea Past Medical History Notes: Sarcoidosis of lung Cardiovascular Medical History: Positive for: Hypertension Past Medical History Notes: Mediastinal lymphadenopathy Immunological Medical History: Past Medical History Notes: Sarcoidosis Musculoskeletal Medical History: Positive for: Rheumatoid Arthritis; Osteoarthritis - knee MACARIO, NEYER ([id number]) [id number] Page 8 of 8 Immunizations Pneumococcal Vaccine: Received Pneumococcal Vaccination: No Implantable Devices None Family and Social History Cancer: Yes - Maternal Grandparents; Diabetes: Yes - Father; Heart Disease: Yes - Father; Hereditary Spherocytosis: No; Hypertension: Yes - Father; Kidney Disease: No; Lung Disease: No; Seizures: No; Stroke: Yes - Father; Thyroid Problems: No; Tuberculosis: No; Former smoker - Quit 2017; Marital Status - Married; Alcohol Use: Moderate; Drug Use: No History; Caffeine Use: Daily; Financial Concerns: No; Food, Clothing or Shelter Needs: No; Support System Lacking: No; Transportation Concerns: No Electronic Signature(s) Signed: 06/28/2022 11:14:04 AM By: Kalman Shan DO Entered By: Kalman Shan on 06/28/2022 10:16:15 -------------------------------------------------------------------------------- SuperBill Details Patient Name: Date of Service: Anthony [name]. 06/28/2022 Medical Record Number: [id number] Patient Account Number: [id number] Date of Birth/Sex: Treating RN: 1981/03/02 (42 y.o. 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 Facility Procedures : CPT4 Code: [id number] Description: [id number] - DEBRIDE WOUND 1ST 20 SQ CM OR < ICD-10 Diagnosis Description L97.812 Non-pressure chronic ulcer of other part of right lower leg with fat layer expos I87.311 Chronic venous hypertension (idiopathic) with ulcer of right lower  extremity Modifier: ed Quantity: 1 Physician Procedures : CPT4 Code Description Modifier [id number] - WC PHYS DEBR WO ANESTH 20 SQ CM ICD-10 Diagnosis Description [id number] Non-pressure chronic ulcer of other part of right lower leg with fat layer exposed I87.311 Chronic venous hypertension (idiopathic) with  ulcer of right lower extremity Quantity: 1 Electronic Signature(s) Signed: 06/28/2022 11:14:04 AM By: Kalman Shan DO Previous Signature: 06/28/2022 10:13:15 AM Version By: Kalman Shan DO Entered By: Kalman Shan on 06/28/2022 10:53:40

## 2022-06-30 NOTE — Progress Notes (Signed)
Anthony [name], Anthony [name] ([id number]) [phone].pdf Page 1 of 9 Visit Report for 06/28/2022 Arrival Information Details Patient Name: Date of Service: Anthony [name] 06/28/2022 9:30 A M Medical Record Number: [id number] Patient Account Number: [id number] Date of Birth/Sex: Treating RN: 09-21-1980 (42 y.o. Hessie Diener Primary Care Gerilynn Mccullars: PCP, NO Other Clinician: Referring Nygel Prokop: Treating Elysa Womac/Extender: Yaakov Guthrie in Treatment: 3 Visit Information History Since Last Visit Added or deleted any medications: No Patient Arrived: Ambulatory Any new allergies or adverse reactions: No Arrival Time: 09:50 Had a fall or experienced change in No Accompanied By: self activities of daily living that may affect Transfer Assistance: None risk of falls: Patient Identification Verified: Yes Signs or symptoms of abuse/neglect since last visito No Secondary Verification Process Completed: Yes Hospitalized since last visit: No Patient Requires Transmission-Based Precautions: No Implantable device outside of the clinic excluding No Patient Has Alerts: No cellular tissue based products placed in the center since last visit: Has Dressing in Place as Prescribed: Yes Has Compression in Place as Prescribed: Yes Pain Present Now: No Electronic Signature(s) Signed: 06/30/2022 2:19:36 PM By: Deon Pilling RN, BSN Entered By: Deon Pilling on 06/28/2022 09:50:29 -------------------------------------------------------------------------------- Compression Therapy Details Patient Name: Date of Service: Anthony Deeds RO N J. 06/28/2022 9:30 A M Medical Record Number: [id number] Patient Account Number: [id number] Date of Birth/Sex: Treating RN: 1980-07-21 (42 y.o. Hessie Diener Primary Care Kaliya Shreiner: PCP, NO Other Clinician: Referring Burley Kopka: Treating Lorry Anastasi/Extender: Yaakov Guthrie in Treatment: 3 Compression Therapy Performed for Wound Assessment:  Wound #2 Right,Proximal,Medial Lower Leg Performed By: Clinician Deon Pilling, RN Compression Type: Four Layer Post Procedure Diagnosis Same as Pre-procedure Electronic Signature(s) Signed: 06/30/2022 2:19:36 PM By: Deon Pilling RN, BSN Entered By: Deon Pilling on 06/28/2022 10:08:05 [name] ([id number].pdf Page 2 of 9 -------------------------------------------------------------------------------- Encounter Discharge Information Details Patient Name: Date of Service: [name], Anthony 06/28/2022 9:30 A M Medical Record Number: [id number] Patient Account Number: [id number] Date of Birth/Sex: Treating RN: 10/13/1980 (42 y.o. Hessie Diener Primary Care Cierrah Dace: PCP, NO Other Clinician: Referring Mataya Kilduff: Treating Geralynn Capri/Extender: Yaakov Guthrie in Treatment: 3 Encounter Discharge Information Items Post Procedure Vitals Discharge Condition: Stable Temperature (F): 98.2 Ambulatory Status: Ambulatory Pulse (bpm): 73 Discharge Destination: Home Respiratory Rate (breaths/min): 20 Transportation: Private Auto Blood Pressure (mmHg): 146/85 Accompanied By: self Schedule Follow-up Appointment: Yes Clinical Summary of Care: Electronic Signature(s) Signed: 06/30/2022 2:19:36 PM By: Deon Pilling RN, BSN Entered By: Deon Pilling on 06/28/2022 10:10:56 -------------------------------------------------------------------------------- Lower Extremity Assessment Details Patient Name: Date of Service: Anthony Deeds RO N J. 06/28/2022 9:30 A M Medical Record Number: [id number] Patient Account Number: [id number] Date of Birth/Sex: Treating RN: Oct 19, 1980 (42 y.o. Hessie Diener Primary Care Stehanie Ekstrom: PCP, NO Other Clinician: Referring Axtyn Woehler: Treating Nathanie Ottley/Extender: Yaakov Guthrie in Treatment: 3 Edema Assessment Assessed: [Left: No] [Right: Yes] Edema: [Left: Ye] [Right: s] Calf Left: Right: Point of Measurement:  32 cm From Medial Instep 61 cm Ankle Left: Right: Point of Measurement: 11 cm From Medial Instep 32 cm Vascular Assessment Pulses: Dorsalis Pedis Palpable: [Right:Yes] Electronic Signature(s) Signed: 06/30/2022 2:19:36 PM By: Deon Pilling RN, BSN Entered By: Deon Pilling on 06/28/2022 09:57:38 Multi Wound Chart Details -------------------------------------------------------------------------------- [name] ([id number].pdf Page 3 of 9 Patient Name: Date of Service: Anthony [name] 06/28/2022 9:30 A M Medical Record Number: [id number] Patient Account Number: [id number] Date of Birth/Sex: Treating RN: 1981/04/28 (42 y.o. M) Primary Care Aala Ransom:  PCP, NO Other Clinician: Referring Benson Porcaro: Treating Tonya Wantz/Extender: Yaakov Guthrie in Treatment: 3 Vital Signs Height(in): 68 Pulse(bpm): 19 Weight(lbs): [id number] Blood Pressure(mmHg): 146/85 Body Mass Index(BMI): 87.3 Temperature(F): 98.2 Respiratory Rate(breaths/min): 20 Wound Assessments Wound Number: 2 N/A N/A Photos: N/A N/A Right, Proximal, Medial Lower Leg N/A N/A Wound Location: Gradually Appeared N/A N/A Wounding Event: Venous Leg Ulcer N/A N/A Primary Etiology: Sleep Apnea, Hypertension, N/A N/A Comorbid History: Rheumatoid Arthritis, Osteoarthritis 04/03/2022 N/A N/A Date Acquired: 3 N/A N/A Weeks of Treatment: Open N/A N/A Wound Status: No N/A N/A Wound Recurrence: 0.4x0.5x0.1 N/A N/A Measurements L x W x D (cm) 0.157 N/A N/A A (cm) : rea 0.016 N/A N/A Volume (cm) : 55.50% N/A N/A % Reduction in A rea: 54.30% N/A N/A % Reduction in Volume: Full Thickness Without Exposed N/A N/A Classification: Support Structures Medium N/A N/A Exudate A mount: Serosanguineous N/A N/A Exudate Type: red, brown N/A N/A Exudate Color: Distinct, outline attached N/A N/A Wound Margin: Large (67-100%) N/A N/A Granulation A mount: Red, Hyper-granulation N/A  N/A Granulation Quality: Small (1-33%) N/A N/A Necrotic A mount: Eschar N/A N/A Necrotic Tissue: Fat Layer (Subcutaneous Tissue): Yes N/A N/A Exposed Structures: Fascia: No Tendon: No Muscle: No Joint: No Bone: No Large (67-100%) N/A N/A Epithelialization: Debridement - Selective/Open Wound N/A N/A Debridement: Pre-procedure Verification/Time Out 10:00 N/A N/A Taken: Lidocaine 5% topical ointment N/A N/A Pain Control: Slough N/A N/A Tissue Debrided: Skin/Epidermis N/A N/A Level: 1 N/A N/A Debridement A (sq cm): rea Curette N/A N/A Instrument: Minimum N/A N/A Bleeding: Pressure N/A N/A Hemostasis A chieved: 0 N/A N/A Procedural Pain: 0 N/A N/A Post Procedural Pain: Procedure was tolerated well N/A N/A Debridement Treatment Response: 1x0.5x0.1 N/A N/A Post Debridement Measurements L x W x D (cm) 0.039 N/A N/A Post Debridement Volume: (cm) Excoriation: No N/A N/A Periwound Skin Texture: Induration: No Callus: No Crepitus: No Rash: No Scarring: No Maceration: No N/A N/A Periwound Skin Moisture: Dry/Scaly: No Hemosiderin Staining: Yes N/A N/A Periwound Skin Color: Atrophie BlancheJAHSIER, RITTENOUR ([id number]) [id number].pdf Page 4 of 9 Cyanosis: No Ecchymosis: No Erythema: No Mottled: No Pallor: No Rubor: No Compression Therapy N/A N/A Procedures Performed: Debridement Treatment Notes Wound #2 (Lower Leg) Wound Laterality: Right, Medial, Proximal Cleanser Soap and Water Discharge Instruction: May shower and wash wound with dial antibacterial soap and water prior to dressing change. Peri-Wound Care Sween Lotion (Moisturizing lotion) Discharge Instruction: Apply moisturizing lotion as directed Topical Gentamicin Discharge Instruction: As directed by physician Mupirocin Ointment Discharge Instruction: Apply Mupirocin (Bactroban) as instructed zinc at the top of lower portion of leg to secure wrap in  place. Discharge Instruction: see product. Primary Dressing PolyMem Silver Non-Adhesive Dressing, 4.25x4.25 in Discharge Instruction: Apply to wound bed as instructed Secondary Dressing Woven Gauze Sponge, Non-Sterile 4x4 in Discharge Instruction: Apply over primary dressing as directed. Zetuvit Plus Silicone Border Dressing 4x4 (in/in) Discharge Instruction: Apply silicone border over primary dressing as directed. Secured With Compression Wrap FourPress (4 layer compression wrap) Discharge Instruction: Apply four layer compression as directed. May also use URGO K2. Compression Stockings Add-Ons Electronic Signature(s) Signed: 06/28/2022 11:14:04 AM By: Kalman Shan DO Entered By: Kalman Shan on 06/28/2022 10:15:40 -------------------------------------------------------------------------------- Multi-Disciplinary Care Plan Details Patient Name: Date of Service: Anthony Deeds RO N J. 06/28/2022 9:30 A M Medical Record Number: [id number] Patient Account Number: [id number] Date of Birth/Sex: Treating RN: [birth year] (42 y.o. Hessie Diener Primary Care Bernis Schreur: PCP, NO Other Clinician: Referring Faustino Luecke: Treating Latrena Benegas/Extender: Yaakov Guthrie  in Treatment: [address] MIKKEL, BURKER ([id number]) [phone].pdf Page 5 of 9 Venous Leg Ulcer Nursing Diagnoses: Actual venous Insuffiency (use after diagnosis is confirmed) Knowledge deficit related to disease process and management Goals: Patient will maintain optimal edema control Date Initiated: 06/06/2022 Target Resolution Date: 08/09/2022 Goal Status: Active Patient/caregiver will verbalize understanding of disease process and disease management Date Initiated: 06/06/2022 Target Resolution Date: 08/09/2022 Goal Status: Active Interventions: Assess peripheral edema status every visit. Compression as ordered Provide education on venous insufficiency Notes: Wound/Skin  Impairment Nursing Diagnoses: Impaired tissue integrity Knowledge deficit related to ulceration/compromised skin integrity Goals: Patient/caregiver will verbalize understanding of skin care regimen Date Initiated: 06/06/2022 Target Resolution Date: 08/09/2022 Goal Status: Active Ulcer/skin breakdown will have a volume reduction of 30% by week 4 Date Initiated: 06/06/2022 Target Resolution Date: 07/04/2022 Goal Status: Active Interventions: Assess patient/caregiver ability to obtain necessary supplies Assess patient/caregiver ability to perform ulcer/skin care regimen upon admission and as needed Assess ulceration(s) every visit Provide education on ulcer and skin care Notes: Electronic Signature(s) Signed: 06/30/2022 2:19:36 PM By: Deon Pilling RN, BSN Entered By: Deon Pilling on 06/28/2022 09:58:33 -------------------------------------------------------------------------------- Pain Assessment Details Patient Name: Date of Service: Anthony Deeds RO N J. 06/28/2022 9:30 A M Medical Record Number: [id number] Patient Account Number: [id number] Date of Birth/Sex: Treating RN: 10-Sep-1980 (42 y.o. Hessie Diener Primary Care Arlene Genova: PCP, NO Other Clinician: Referring Demico Ploch: Treating Jaelyne Deeg/Extender: Yaakov Guthrie in Treatment: 3 Active Problems Location of Pain Severity and Description of Pain Patient Has Paino No Site Locations Rate the pain. SAMI, BLUNK ([id number]) [phone].pdf Page 6 of 9 Rate the pain. Current Pain Level: 0 Pain Management and Medication Current Pain Management: Medication: No Cold Application: No Rest: No Massage: No Activity: No T.E.N.S.: No Heat Application: No Leg drop or elevation: No Is the Current Pain Management Adequate: Adequate How does your wound impact your activities of daily livingo Sleep: No Bathing: No Appetite: No Relationship With Others: No Bladder Continence: No Emotions: No Bowel  Continence: No Work: No Toileting: No Drive: No Dressing: No Hobbies: No Engineer, maintenance) Signed: 06/30/2022 2:19:36 PM By: Deon Pilling RN, BSN Entered By: Deon Pilling on 06/28/2022 09:51:09 -------------------------------------------------------------------------------- Patient/Caregiver Education Details Patient Name: Date of Service: Sherryl Manges 2/16/2024andnbsp9:30 A M Medical Record Number: [id number] Patient Account Number: [id number] Date of Birth/Gender: Treating RN: 05/14/1980 (42 y.o. Hessie Diener Primary Care Physician: PCP, NO Other Clinician: Referring Physician: Treating Physician/Extender: Yaakov Guthrie in Treatment: 3 Education Assessment Education Provided To: Patient Education Topics Provided Venous: Handouts: Controlling Swelling with Compression Stockings, Managing Venous Insufficiency Methods: Explain/Verbal Responses: Reinforcements needed Electronic Signature(s) Signed: 06/30/2022 2:19:36 PM By: Deon Pilling RN, BSN Entered By: Deon Pilling on 06/28/2022 09:59:46 [name] ([id number]) [id number].pdf Page 7 of 9 -------------------------------------------------------------------------------- Wound Assessment Details Patient Name: Date of Service: CORDY, BOX 06/28/2022 9:30 A M Medical Record Number: [id number] Patient Account Number: [id number] Date of Birth/Sex: Treating RN: 03/19/1981 (42 y.o. Hessie Diener Primary Care Joselinne Lawal: PCP, NO Other Clinician: Referring Brandan Glauber: Treating Cormac Wint/Extender: Yaakov Guthrie in Treatment: 3 Wound Status Wound Number: 2 Primary Etiology: Venous Leg Ulcer Wound Location: Right, Proximal, Medial Lower Leg Wound Status: Open Wounding Event: Gradually Appeared Comorbid Sleep Apnea, Hypertension, Rheumatoid Arthritis, History: Osteoarthritis Date Acquired: 04/03/2022 Weeks Of Treatment: 3 Clustered Wound: No Photos Wound  Measurements Length: (cm) 0. Width: (cm) 0. Depth: (cm) 0. Area: (cm) 0 Volume: (cm) 0 4 %  Reduction in Area: 55.5% 5 % Reduction in Volume: 54.3% 1 Epithelialization: Large (67-100%) .157 Tunneling: No .016 Undermining: No Wound Description Classification: Full Thickness Without Exposed Suppor Wound Margin: Distinct, outline attached Exudate Amount: Medium Exudate Type: Serosanguineous Exudate Color: red, brown t Structures Foul Odor After Cleansing: No Slough/Fibrino Yes Wound Bed Granulation Amount: Large (67-100%) Exposed Structure Granulation Quality: Red, Hyper-granulation Fascia Exposed: No Necrotic Amount: Small (1-33%) Fat Layer (Subcutaneous Tissue) Exposed: Yes Necrotic Quality: Eschar Tendon Exposed: No Muscle Exposed: No Joint Exposed: No Bone Exposed: No Periwound Skin Texture Texture Color No Abnormalities Noted: No No Abnormalities Noted: No Callus: No Atrophie Blanche: No Crepitus: No Cyanosis: No Excoriation: No Ecchymosis: No Induration: No Erythema: No Rash: No Hemosiderin Staining: Yes Scarring: No Mottled: No Pallor: No Moisture Rubor: No No Abnormalities Noted: No Dry / Scaly: No Maceration: No ZAYDIN, AUGHENBAUGH ([id number]) [phone].pdf Page 8 of 9 Treatment Notes Wound #2 (Lower Leg) Wound Laterality: Right, Medial, Proximal Cleanser Soap and Water Discharge Instruction: May shower and wash wound with dial antibacterial soap and water prior to dressing change. Peri-Wound Care Sween Lotion (Moisturizing lotion) Discharge Instruction: Apply moisturizing lotion as directed Topical Gentamicin Discharge Instruction: As directed by physician Mupirocin Ointment Discharge Instruction: Apply Mupirocin (Bactroban) as instructed zinc at the top of lower portion of leg to secure wrap in place. Discharge Instruction: see product. Primary Dressing PolyMem Silver Non-Adhesive Dressing, 4.25x4.25 in Discharge  Instruction: Apply to wound bed as instructed Secondary Dressing Woven Gauze Sponge, Non-Sterile 4x4 in Discharge Instruction: Apply over primary dressing as directed. Zetuvit Plus Silicone Border Dressing 4x4 (in/in) Discharge Instruction: Apply silicone border over primary dressing as directed. Secured With Compression Wrap FourPress (4 layer compression wrap) Discharge Instruction: Apply four layer compression as directed. May also use URGO K2. Compression Stockings Add-Ons Electronic Signature(s) Signed: 06/30/2022 2:19:36 PM By: Deon Pilling RN, BSN Entered By: Deon Pilling on 06/28/2022 09:59:00 -------------------------------------------------------------------------------- Vitals Details Patient Name: Date of Service: Anthony Deeds RO N J. 06/28/2022 9:30 A M Medical Record Number: [id number] Patient Account Number: [id number] Date of Birth/Sex: Treating RN: 05-02-1981 (42 y.o. Hessie Diener Primary Care Kerria Sapien: PCP, NO Other Clinician: Referring Ivon Oelkers: Treating Nikolis Berent/Extender: Yaakov Guthrie in Treatment: 3 Vital Signs Time Taken: 09:50 Temperature (F): 98.2 Height (in): 68 Pulse (bpm): 73 Weight (lbs): 574 Respiratory Rate (breaths/min): 20 Body Mass Index (BMI): 87.3 Blood Pressure (mmHg): 146/85 Reference Range: 80 - 120 mg / dl Electronic Signature(s) Signed: 06/30/2022 2:19:36 PM By: Deon Pilling RN, BSN [name] ([id number]) [phone].pdf Page 9 of 9 Entered By: Deon Pilling on 06/28/2022 09:51:02

## 2022-07-05 ENCOUNTER — Encounter (HOSPITAL_BASED_OUTPATIENT_CLINIC_OR_DEPARTMENT_OTHER): Payer: Commercial Managed Care - HMO | Admitting: Internal Medicine

## 2022-07-05 DIAGNOSIS — L97812 Non-pressure chronic ulcer of other part of right lower leg with fat layer exposed: Secondary | ICD-10-CM

## 2022-07-05 DIAGNOSIS — D862 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: Secondary | ICD-10-CM

## 2022-07-05 DIAGNOSIS — I87311 Chronic venous hypertension (idiopathic) with ulcer of right lower extremity: Secondary | ICD-10-CM

## 2022-07-06 NOTE — Progress Notes (Signed)
KAZ, RISHER ([id number]) [id number] Page 1 of 7 Visit Report for 07/05/2022 Chief Complaint Document Details Patient Name: Date of Service: Anthony [name], Anthony [name] 07/05/2022 8:45 A M Medical Record Number: [id number] Patient Account Number: [id number] Date of Birth/Sex: Treating RN: 23-Aug-1980 (42 y.o. M) Primary Care Provider: PCP, NO Other Clinician: Referring Provider: Treating Provider/Extender: Anthony [name] in Treatment: 4 Information Obtained from: Patient Chief Complaint Right lower extremity wound Electronic Signature(s) Signed: 07/05/2022 12:04:24 PM By: Kalman Shan DO Entered By: Kalman Shan on 07/05/2022 09:23:19 -------------------------------------------------------------------------------- HPI Details Patient Name: Date of Service: Anthony [name]. 07/05/2022 8:45 A M Medical Record Number: [id number] Patient Account Number: [id number] Date of Birth/Sex: Treating RN: 14-Nov-1980 (42 y.o. M) Primary Care Provider: PCP, NO Other Clinician: Referring Provider: Treating Provider/Extender: Anthony [name] in Treatment: 4 History of Present Illness HPI Description: Admission 07/31/2021 [name] is a 42 year old male with a past medical history of sarcoidosis, obstructive sleep apnea and morbid obesity that presents to the clinic for a 1 year history of wounds to his right lower extremity. He states these wax and wane in size. He states that it currently does not drain but has weeped in the past. He reports taking amoxicillin last month and this helped improve the wound size and appearance. He does not have compression stockings. He currently denies signs of infection. 3/28; patient presents for follow-up. He tolerated the compression wrap well. He has no issues or complaints today. He denies signs of infection. 4/4; patient presents for follow-up. He has no issues or complaints 4/11; 1 week follow-up. The  patient appears to be doing very well with the wound on the right medial lower leg. We are using Hydrofera Blue and 3 layer compression. The patient has sarcoidosis and is on chronic prednisone he is convinced that both of these have caused this wound although it certainly looks like this is chronic venous insufficiency with developing lymphedema.He has never worn compression stockings. 4/18; patient presents for follow-up. He has no issues or complaints today. He has not ordered compression stockings yet but states he will do this this week. 4/25; patient presents for follow-up. He has not ordered his compression stockings. He has the information to do so. He tolerated the compression wrap well. He has no issues or complaints today. 06/06/2022 Anthony [name] is a 42 year old male with a past medical history of venous insufficiency previously treated in our clinic last year for right lower extremity wound. He again presents with a similar wound that started spontaneously 2 months ago. He has been keeping the area covered. It sounds like he is never order the compression stockings that were recommended at last clinic admission. We gave him information today to elastic therapy to order these. 2/2; patient presents for follow-up. We were using antibiotic ointment with Hydrofera Blue under 3 layer compression. Unfortunately he states that the wrap slid down 2 days after it was placed. He did not let us know. He has been keeping the area covered. 2/8; patient presents for follow-up. We have been using Santyl and Hydrofera Blue under 4-layer compression to the right lower extremity. One of the two wounds has healed. He tolerated the wrap well but still slid down some this week. 2/16; patient presents for follow-up. We have been using Santyl and Hydrofera Blue under 4-layer compression to the right lower extremity. He has no issues ELIHUE, LUNDSTEN ([id number]) [id number] Page  2 of 7 or complaints today. 2/23;  patient presents for follow-up. We have been using PolyMem silver with antibiotic ointment under 4-layer compression. His wound is healed. He has compression stockings at home. Electronic Signature(s) Signed: 07/05/2022 12:04:24 PM By: Kalman Shan DO Entered By: Kalman Shan on 07/05/2022 09:23:52 -------------------------------------------------------------------------------- Physical Exam Details Patient Name: Date of Service: Anthony [name]. 07/05/2022 8:45 A M Medical Record Number: [id number] Patient Account Number: [id number] Date of Birth/Sex: Treating RN: 1980/09/15 (42 y.o. M) Primary Care Provider: PCP, NO Other Clinician: Referring Provider: Treating Provider/Extender: Anthony [name] in Treatment: 4 Constitutional respirations regular, non-labored and within target range for patient.. Cardiovascular 2+ dorsalis pedis/posterior tibialis pulses. Psychiatric pleasant and cooperative. Notes Right lower extremity: T the medial aspect there there is epithelization to the previous wound sites. Stage III lymphedema. No signs of infection. o Electronic Signature(s) Signed: 07/05/2022 12:04:24 PM By: Kalman Shan DO Entered By: Kalman Shan on 07/05/2022 09:24:37 -------------------------------------------------------------------------------- Physician Orders Details Patient Name: Date of Service: Anthony [name]. 07/05/2022 8:45 A M Medical Record Number: [id number] Patient Account Number: [id number] Date of Birth/Sex: Treating RN: 07-22-1980 (42 y.o. Hessie Diener Primary Care Provider: PCP, NO Other Clinician: Referring Provider: Treating Provider/Extender: Anthony [name] in Treatment: 4 Verbal / Phone Orders: No Diagnosis Coding Discharge From [facility] Discharge from [place] - Call if any future wound care needs. wear compression stockings for life. Replace and Purchase new  compression stockings 6-8 months. Edema Control - Lymphedema / SCD / Other Elevate legs to the level of the heart or above for 30 minutes daily and/or when sitting for 3-4 times a day throughout the day. Avoid standing for long periods of time. Patient to wear own compression stockings every day. Exercise regularly Moisturize legs daily. Other Edema Control Orders/Instructions: - wear tubigrip size E from office today until you arrive home and switch to your compression stockings. BARRICK, GLEASON ([id number]) [id number] Page 3 of 7 Electronic Signature(s) Signed: 07/05/2022 12:04:24 PM By: Kalman Shan DO Entered By: Kalman Shan on 07/05/2022 09:24:45 -------------------------------------------------------------------------------- Problem List Details Patient Name: Date of Service: Anthony [name]. 07/05/2022 8:45 A M Medical Record Number: [id number] Patient Account Number: [id number] Date of Birth/Sex: Treating RN: 1980/07/09 (42 y.o. 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 07/05/2022 8:45 A M Medical Record Number: [id number] Patient Account Number: [id number] Date of  Birth/Sex: Treating RN: 1981/04/09 (42 y.o. M) Primary Care Provider: PCP, NO Other Clinician: Referring Provider: Treating Provider/Extender: Anthony [name] in Treatment: 4 Subjective Chief Complaint Information obtained from Patient Right lower extremity wound History of Present Illness (HPI) Admission 07/31/2021 Anthony [name] is a 42 year old male with a past medical history of sarcoidosis, obstructive sleep apnea and morbid obesity that presents to the clinic for NATH, CARTER ([id number]) [phone] Page 4 of 7 a 1 year history of wounds to his right lower extremity. He states  these wax and wane in size. He states that it currently does not drain but has weeped in the past. He reports taking amoxicillin last month and this helped improve the wound size and appearance. He does not have compression stockings. He currently denies signs of infection. 3/28; patient presents for follow-up. He tolerated the compression wrap well. He has no issues or complaints today. He denies signs of infection. 4/4; patient presents for follow-up. He has no issues or complaints 4/11; 1 week follow-up. The patient appears to be doing very well with the wound on the right medial lower leg. We are using Hydrofera Blue and 3 layer compression. The patient has sarcoidosis and is on chronic prednisone he is convinced that both of these have caused this wound although it certainly looks like this is chronic venous insufficiency with developing lymphedema.He has never worn compression stockings. 4/18; patient presents for follow-up. He has no issues or complaints today. He has not ordered compression stockings yet but states he will do this this week. 4/25; patient presents for follow-up. He has not ordered his compression stockings. He has the information to do so. He tolerated the compression wrap well. He has no issues or complaints today. 06/06/2022 Anthony [name] is  a 42 year old male with a past medical history of venous insufficiency previously treated in our clinic last year for right lower extremity wound. He again presents with a similar wound that started spontaneously 2 months ago. He has been keeping the area covered. It sounds like he is never order the compression stockings that were recommended at last clinic admission. We gave him information today to elastic therapy to order these. 2/2; patient presents for follow-up. We were using antibiotic ointment with Hydrofera Blue under 3 layer compression. Unfortunately he states that the wrap slid down 2 days after it was placed. He did not let us know. He has been keeping the area covered. 2/8; patient presents for follow-up. We have been using Santyl and Hydrofera Blue under 4-layer compression to the right lower extremity. One of the two wounds has healed. He tolerated the wrap well but still slid down some this week. 2/16; patient presents for follow-up. We have been using Santyl and Hydrofera Blue under 4-layer compression to the right lower extremity. He has no issues or complaints today. 2/23; patient presents for follow-up. We have been using PolyMem silver with antibiotic ointment under 4-layer compression. His wound is healed. He has compression stockings at home. Patient History Information obtained from Patient. Family History Cancer - Maternal Grandparents, Diabetes - Father, Heart Disease - Father, Hypertension - Father, Stroke - Father, No family history of Hereditary Spherocytosis, Kidney Disease, Lung Disease, Seizures, Thyroid Problems, Tuberculosis. Social History Former smoker - Quit 2017, Marital Status - Married, Alcohol Use - Moderate, Drug Use - No History, Caffeine Use - Daily. Medical History Respiratory Patient has history of Sleep Apnea Cardiovascular Patient has history of Hypertension Musculoskeletal Patient has history of Rheumatoid Arthritis, Osteoarthritis -  knee Medical A Surgical History Notes nd Respiratory Sarcoidosis of lung Cardiovascular Mediastinal lymphadenopathy Immunological Sarcoidosis Objective Constitutional respirations regular, non-labored and within target range for patient.. Vitals Time Taken: 9:02 AM, Height: 68 in, Weight: 574 lbs, BMI: 87.3, Temperature: 97.7 F, Pulse: 85 bpm, Respiratory Rate: 20 breaths/min, Blood Pressure: 150/80 mmHg. Cardiovascular 2+ dorsalis pedis/posterior tibialis pulses. Psychiatric pleasant and cooperative. General Notes: Right lower extremity: T the medial aspect there there is epithelization to the previous wound sites. Stage III lymphedema. No signs of  o infection. Integumentary (Hair, Skin) Anthony [name], Anthony [name] ([id number]) [id number] Page 5 of 7 Wound #2 status is Open. Original cause of wound was Gradually Appeared. The date acquired was: 04/03/2022. The wound has been in treatment 4 weeks. The wound is located on the Right,Proximal,Medial Lower Leg. The wound measures 0cm length x 0cm width x 0cm depth; 0cm^2 area and 0cm^3 volume. There is no tunneling or undermining noted. There is a none present amount of drainage noted. The wound margin is distinct with the outline attached to the wound base. There is no granulation within the wound bed. There is no necrotic tissue within the wound bed. The periwound skin appearance exhibited: Hemosiderin Staining. The periwound skin appearance did not exhibit: Callus, Crepitus, Excoriation, Induration, Rash, Scarring, Dry/Scaly, Maceration, Atrophie Blanche, Cyanosis, Ecchymosis, Mottled, Pallor, Rubor, Erythema. 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 His wound is healed. I recommended wearing compression stockings daily. He may follow-up as needed. Plan Discharge From [facility]: Discharge from [place] - Call if any future wound care needs. wear compression stockings for life. Replace and Purchase new compression stockings 6-8 months. Edema Control - Lymphedema / SCD / Other: Elevate legs to the level of the heart or above for 30 minutes daily and/or when sitting for 3-4 times a day throughout the day. Avoid standing for long periods of time. Patient to wear own compression stockings every day. Exercise regularly Moisturize legs daily. Other Edema Control Orders/Instructions: - wear tubigrip size E from office today until you arrive home and switch to your compression stockings. 1. Follow-up as needed 2. Discharge from clinic due to closed wound 3. Compression stockings daily Electronic Signature(s) Signed: 07/05/2022 12:04:24 PM By: Kalman Shan DO Entered By: Kalman Shan on 07/05/2022 09:26:52 -------------------------------------------------------------------------------- HxROS Details Patient Name: Date of Service: Anthony [name]. 07/05/2022 8:45 A M Medical Record Number: [id number] Patient Account Number: [id number] Date of Birth/Sex: Treating RN: 1980-06-17 (42 y.o. M) Primary Care Provider: PCP, NO Other Clinician: Referring Provider: Treating Provider/Extender: Anthony [name] in Treatment: 4 Information Obtained From Patient Respiratory Medical History: Positive for: Sleep Apnea Past Medical History Notes: Sarcoidosis of lung Cardiovascular KIMANI, TITONE ([id number]) [id number] Page 6 of 7 Medical History: Positive for: Hypertension Past Medical History Notes: Mediastinal lymphadenopathy Immunological Medical History: Past Medical History Notes: Sarcoidosis Musculoskeletal Medical History: Positive for:  Rheumatoid Arthritis; Osteoarthritis - knee Immunizations Pneumococcal Vaccine: Received Pneumococcal Vaccination: No Implantable Devices None Family and Social History Cancer: Yes - Maternal Grandparents; Diabetes: Yes - Father; Heart Disease: Yes - Father; Hereditary Spherocytosis: No; Hypertension: Yes - Father; Kidney Disease: No; Lung Disease: No; Seizures: No; Stroke: Yes - Father; Thyroid Problems: No; Tuberculosis: No; Former smoker - Quit 2017; Marital Status - Married; Alcohol Use: Moderate; Drug Use: No History; Caffeine Use: Daily; Financial Concerns: No; Food, Clothing or Shelter Needs: No; Support System Lacking: No; Transportation Concerns: No Electronic Signature(s) Signed: 07/05/2022 12:04:24 PM By: Kalman Shan DO Entered By: Kalman Shan on 07/05/2022 09:23:57 -------------------------------------------------------------------------------- SuperBill Details Patient Name: Date of Service: Sherryl Manges. 07/05/2022 Medical Record Number: [id number] Patient Account Number: [id number] Date of Birth/Sex: Treating RN: Jun 26, 1980 (42 y.o. 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 Facility Procedures : CPT4 Code: [id number] Description: 99213 - WOUND CARE VISIT-LEV 3 EST PT Modifier: Quantity: 1 Physician Procedures : CPT4 Code Description Modifier [id number] - WC PHYS LEVEL 3 - EST PT ICD-10 Diagnosis Description I87.311 Chronic venous hypertension (idiopathic) with ulcer of right lower extremity L97.812 Non-pressure chronic ulcer of other part of right lower  leg with  fat layer exposed D86.2 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

## 2022-07-06 NOTE — Progress Notes (Signed)
LORAIN, WHORTON ([id number]) [id number] Page 1 of 8 Visit Report for 07/05/2022 Arrival Information Details Patient Name: Date of Service: [name], [name] 07/05/2022 8:45 A M Medical Record Number: [id number] Patient Account Number: [id number] Date of Birth/Sex: Treating RN: 1981-05-04 (42 y.o. Anthony [name] Primary Care Japleen Tornow: PCP, NO Other Clinician: Referring Yu Cragun: Treating Asli Tokarski/Extender: Yaakov Guthrie in Treatment: 4 Visit Information History Since Last Visit Added or deleted any medications: No Patient Arrived: Ambulatory Any new allergies or adverse reactions: No Arrival Time: 09:00 Had a fall or experienced change in No Accompanied By: self activities of daily living that may affect Transfer Assistance: None risk of falls: Patient Identification Verified: Yes Signs or symptoms of abuse/neglect since last visito No Secondary Verification Process Completed: Yes Hospitalized since last visit: No Patient Requires Transmission-Based Precautions: No Implantable device outside of the clinic excluding No Patient Has Alerts: No cellular tissue based products placed in the center since last visit: Has Dressing in Place as Prescribed: Yes Has Compression in Place as Prescribed: Yes Pain Present Now: No Electronic Signature(s) Signed: 07/05/2022 12:41:16 PM By: Deon Pilling RN, BSN Entered By: Deon Pilling on 07/05/2022 09:05:15 -------------------------------------------------------------------------------- Clinic Level of Care Assessment Details Patient Name: Date of Service: Anthony Deeds RO N J. 07/05/2022 8:45 A M Medical Record Number: [id number] Patient Account Number: [id number] Date of Birth/Sex: Treating RN: 08-02-1980 (42 y.o. Anthony [name] Primary Care Kaisyn Millea: PCP, NO Other Clinician: Referring Daemyn Gariepy: Treating Asma Boldon/Extender: Yaakov Guthrie in Treatment: 4 Clinic Level of Care Assessment  Items TOOL 4 Quantity Score X- 1 0 Use when only an EandM is performed on FOLLOW-UP visit ASSESSMENTS - Nursing Assessment / Reassessment X- 1 10 Reassessment of Co-morbidities (includes updates in patient status) X- 1 5 Reassessment of Adherence to Treatment Plan ASSESSMENTS - Wound and Skin A ssessment / Reassessment X - Simple Wound Assessment / Reassessment - one wound 1 5 '[]'$  - 0 Complex Wound Assessment / Reassessment - multiple wounds X- 1 10 Dermatologic / Skin Assessment (not related to wound area) ASSESSMENTS - Focused Assessment X- 1 5 Circumferential Edema Measurements - multi extremities X- 1 10 Nutritional Assessment / Counseling / Intervention BRAYLAN, KLEVER ([id number]) [phone].pdf Page 2 of 8 '[]'$  - 0 Lower Extremity Assessment (monofilament, tuning fork, pulses) '[]'$  - 0 Peripheral Arterial Disease Assessment (using hand held doppler) ASSESSMENTS - Ostomy and/or Continence Assessment and Care '[]'$  - 0 Incontinence Assessment and Management '[]'$  - 0 Ostomy Care Assessment and Management (repouching, etc.) PROCESS - Coordination of Care X - Simple Patient / Family Education for ongoing care 1 15 '[]'$  - 0 Complex (extensive) Patient / Family Education for ongoing care X- 1 10 Staff obtains Programmer, systems, Records, T Results / Process Orders est X- 1 10 Staff telephones HHA, Nursing Homes / Clarify orders / etc '[]'$  - 0 Routine Transfer to another Facility (non-emergent condition) '[]'$  - 0 Routine Hospital Admission (non-emergent condition) '[]'$  - 0 New Admissions / Biomedical engineer / Ordering NPWT Apligraf, etc. , '[]'$  - 0 Emergency Hospital Admission (emergent condition) X- 1 10 Simple Discharge Coordination '[]'$  - 0 Complex (extensive) Discharge Coordination PROCESS - Special Needs '[]'$  - 0 Pediatric / Minor Patient Management '[]'$  - 0 Isolation Patient Management '[]'$  - 0 Hearing / Language / Visual special needs '[]'$  - 0 Assessment of  Community assistance (transportation, D/C planning, etc.) '[]'$  - 0 Additional assistance / Altered mentation '[]'$  - 0 Support Surface(s) Assessment (bed, cushion, seat, etc.) INTERVENTIONS -  Wound Cleansing / Measurement X - Simple Wound Cleansing - one wound 1 5 '[]'$  - 0 Complex Wound Cleansing - multiple wounds X- 1 5 Wound Imaging (photographs - any number of wounds) '[]'$  - 0 Wound Tracing (instead of photographs) X- 1 5 Simple Wound Measurement - one wound '[]'$  - 0 Complex Wound Measurement - multiple wounds INTERVENTIONS - Wound Dressings '[]'$  - 0 Small Wound Dressing one or multiple wounds '[]'$  - 0 Medium Wound Dressing one or multiple wounds '[]'$  - 0 Large Wound Dressing one or multiple wounds '[]'$  - 0 Application of Medications - topical '[]'$  - 0 Application of Medications - injection INTERVENTIONS - Miscellaneous '[]'$  - 0 External ear exam '[]'$  - 0 Specimen Collection (cultures, biopsies, blood, body fluids, etc.) '[]'$  - 0 Specimen(s) / Culture(s) sent or taken to Lab for analysis '[]'$  - 0 Patient Transfer (multiple staff / Civil Service fast streamer / Similar devices) '[]'$  - 0 Simple Staple / Suture removal (25 or less) '[]'$  - 0 Complex Staple / Suture removal (26 or more) '[]'$  - 0 Hypo / Hyperglycemic Management (close monitor of Blood Glucose) CLARKE, BLAKER ([id number].pdf Page 3 of 8 '[]'$  - 0 Ankle / Brachial Index (ABI) - do not check if billed separately X- 1 5 Vital Signs Has the patient been seen at the hospital within the last three years: Yes Total Score: 110 Level Of Care: New/Established - Level 3 Electronic Signature(s) Signed: 07/05/2022 12:41:16 PM By: Deon Pilling RN, BSN Entered By: Deon Pilling on 07/05/2022 09:13:29 -------------------------------------------------------------------------------- Encounter Discharge Information Details Patient Name: Date of Service: Anthony Deeds RO N J. 07/05/2022 8:45 A M Medical Record Number:  [id number] Patient Account Number: [id number] Date of Birth/Sex: Treating RN: 11/08/1980 (42 y.o. Anthony [name] Primary Care Anthony [name]: PCP, NO Other Clinician: Referring Leondre Taul: Treating Tyrease Vandeberg/Extender: Yaakov Guthrie in Treatment: 4 Encounter Discharge Information Items Discharge Condition: Stable Ambulatory Status: Ambulatory Discharge Destination: Home Transportation: Private Auto Accompanied By: self Schedule Follow-up Appointment: No Clinical Summary of Care: Notes tubgrip size E applied to right leg. Electronic Signature(s) Signed: 07/05/2022 12:41:16 PM By: Deon Pilling RN, BSN Entered By: Deon Pilling on 07/05/2022 09:13:58 -------------------------------------------------------------------------------- Lower Extremity Assessment Details Patient Name: Date of Service: Anthony Deeds RO N J. 07/05/2022 8:45 A M Medical Record Number: [id number] Patient Account Number: [id number] Date of Birth/Sex: Treating RN: June 17, 1980 (42 y.o. Anthony [name] Primary Care Charmon Thorson: PCP, NO Other Clinician: Referring Tana Trefry: Treating Lakira Ogando/Extender: Yaakov Guthrie in Treatment: 4 Edema Assessment Assessed: [Left: No] [Right: Yes] Edema: [Left: Ye] [Right: s] Calf Left: Right: Point of Measurement: 32 cm From Medial Instep 59 cm Ankle Left: Right: Point of Measurement: 11 cm From Medial Instep 31 cm ENDRIT, GOODBAR ([id number]) [phone].pdf Page 4 of 8 Vascular Assessment Pulses: Dorsalis Pedis Palpable: [Right:Yes] Electronic Signature(s) Signed: 07/05/2022 12:41:16 PM By: Deon Pilling RN, BSN Entered By: Deon Pilling on 07/05/2022 09:05:49 -------------------------------------------------------------------------------- Multi Wound Chart Details Patient Name: Date of Service: Anthony Deeds RO N J. 07/05/2022 8:45 A M Medical Record Number: [id number] Patient Account Number: [id number] Date of Birth/Sex: Treating  RN: 1980/10/23 (42 y.o. M) Primary Care Trelyn Vanderlinde: PCP, NO Other Clinician: Referring Elye Harmsen: Treating Brandan Robicheaux/Extender: Yaakov Guthrie in Treatment: 4 Vital Signs Height(in): 68 Pulse(bpm): 38 Weight(lbs): [id number] Blood Pressure(mmHg): 150/80 Body Mass Index(BMI): 87.3 Temperature(F): 97.7 Respiratory Rate(breaths/min): 20 [2:Photos:] [N/A:N/A] Right, Proximal, Medial Lower Leg N/A N/A Wound Location: Gradually Appeared N/A N/A Wounding Event: Venous Leg Ulcer N/A N/A Primary Etiology: Sleep Apnea,  Hypertension, N/A N/A Comorbid History: Rheumatoid Arthritis, Osteoarthritis 04/03/2022 N/A N/A Date Acquired: 4 N/A N/A Weeks of Treatment: Open N/A N/A Wound Status: No N/A N/A Wound Recurrence: 0x0x0 N/A N/A Measurements L x W x D (cm) 0 N/A N/A A (cm) : rea 0 N/A N/A Volume (cm) : 100.00% N/A N/A % Reduction in Area: 100.00% N/A N/A % Reduction in Volume: Full Thickness Without Exposed N/A N/A Classification: Support Structures None Present N/A N/A Exudate Amount: Distinct, outline attached N/A N/A Wound Margin: None Present (0%) N/A N/A Granulation Amount: None Present (0%) N/A N/A Necrotic Amount: Fascia: No N/A N/A Exposed Structures: Fat Layer (Subcutaneous Tissue): No Tendon: No Muscle: No Joint: No Bone: No Large (67-100%) N/A N/A Epithelialization: Excoriation: No N/A N/A Periwound Skin Texture: Induration: No Callus: No Crepitus: No Rash: No ZYIERE, BODKINS ([id number]) [phone].pdf Page 5 of 8 Scarring: No Maceration: No N/A N/A Periwound Skin Moisture: Dry/Scaly: No Hemosiderin Staining: Yes N/A N/A Periwound Skin Color: Atrophie Blanche: No Cyanosis: No Ecchymosis: No Erythema: No Mottled: No Pallor: No Rubor: No Treatment Notes Electronic Signature(s) Signed: 07/05/2022 12:04:24 PM By: Kalman Shan DO Entered By: Kalman Shan on 07/05/2022  09:23:12 -------------------------------------------------------------------------------- Multi-Disciplinary Care Plan Details Patient Name: Date of Service: Anthony Deeds RO N J. 07/05/2022 8:45 A M Medical Record Number: [id number] Patient Account Number: [id number] Date of Birth/Sex: Treating RN: 06/11/1980 (43 y.o. Anthony [name] Primary Care Antonios Ostrow: PCP, NO Other Clinician: Referring Zacheriah Stumpe: Treating Dequon Schnebly/Extender: Yaakov Guthrie in Treatment: 4 Active Inactive Electronic Signature(s) Signed: 07/05/2022 12:41:16 PM By: Deon Pilling RN, BSN Entered By: Deon Pilling on 07/05/2022 09:12:56 -------------------------------------------------------------------------------- Pain Assessment Details Patient Name: Date of Service: Anthony Deeds RO N J. 07/05/2022 8:45 A M Medical Record Number: [id number] Patient Account Number: [id number] Date of Birth/Sex: Treating RN: 09-Dec-1980 (42 y.o. Anthony [name] Primary Care Muslima Toppins: PCP, NO Other Clinician: Referring Vale Mousseau: Treating Kule Gascoigne/Extender: Yaakov Guthrie in Treatment: 4 Active Problems Location of Pain Severity and Description of Pain Patient Has Paino No Site Locations Rate the pain. MARLIN, DENNEN ([id number]) [id number] Page 6 of 8 Rate the pain. Current Pain Level: 0 Pain Management and Medication Current Pain Management: Medication: No Cold Application: No Rest: No Massage: No Activity: No T.E.N.S.: No Heat Application: No Leg drop or elevation: No Is the Current Pain Management Adequate: Adequate How does your wound impact your activities of daily livingo Sleep: No Bathing: No Appetite: No Relationship With Others: No Bladder Continence: No Emotions: No Bowel Continence: No Work: No Toileting: No Drive: No Dressing: No Hobbies: No Engineer, maintenance) Signed: 07/05/2022 12:41:16 PM By: Deon Pilling RN, BSN Entered By: Deon Pilling on  07/05/2022 09:05:38 -------------------------------------------------------------------------------- Wound Assessment Details Patient Name: Date of Service: Anthony Deeds RO N J. 07/05/2022 8:45 A M Medical Record Number: [id number] Patient Account Number: [id number] Date of Birth/Sex: Treating RN: May 22, 1980 (42 y.o. Anthony [name] Primary Care Mikell Kazlauskas: PCP, NO Other Clinician: Referring Shacora Zynda: Treating Del Overfelt/Extender: Yaakov Guthrie in Treatment: 4 Wound Status Wound Number: 2 Primary Etiology: Venous Leg Ulcer Wound Location: Right, Proximal, Medial Lower Leg Wound Status: Open Wounding Event: Gradually Appeared Comorbid Sleep Apnea, Hypertension, Rheumatoid Arthritis, History: Osteoarthritis Date Acquired: 04/03/2022 Weeks Of Treatment: 4 Clustered Wound: No Photos BRONISLAUS, JEFF ([id number]) [phone].pdf Page 7 of 8 Wound Measurements Length: (cm) Width: (cm) Depth: (cm) Area: (cm) Volume: (cm) 0 % Reduction in Area: 100% 0 % Reduction in Volume: 100% 0 Epithelialization: Large (67-100%) 0 Tunneling: No  0 Undermining: No Wound Description Classification: Full Thickness Without Exposed Support Wound Margin: Distinct, outline attached Exudate Amount: None Present Structures Foul Odor After Cleansing: No Slough/Fibrino No Wound Bed Granulation Amount: None Present (0%) Exposed Structure Necrotic Amount: None Present (0%) Fascia Exposed: No Fat Layer (Subcutaneous Tissue) Exposed: No Tendon Exposed: No Muscle Exposed: No Joint Exposed: No Bone Exposed: No Periwound Skin Texture Texture Color No Abnormalities Noted: No No Abnormalities Noted: No Callus: No Atrophie Blanche: No Crepitus: No Cyanosis: No Excoriation: No Ecchymosis: No Induration: No Erythema: No Rash: No Hemosiderin Staining: Yes Scarring: No Mottled: No Pallor: No Moisture Rubor: No No Abnormalities Noted: No Dry / Scaly: No Maceration:  No Electronic Signature(s) Signed: 07/05/2022 12:41:16 PM By: Deon Pilling RN, BSN Entered By: Deon Pilling on 07/05/2022 09:07:13 -------------------------------------------------------------------------------- [place] Details Patient Name: Date of Service: Anthony Deeds RO N J. 07/05/2022 8:45 A M Medical Record Number: [id number] Patient Account Number: [id number] Date of Birth/Sex: Treating RN: March 20, 1981 (42 y.o. Anthony [name] Primary Care Conan Mcmanaway: PCP, NO Other Clinician: Referring Carel Carrier: Treating Livian Vanderbeck/Extender: Yaakov Guthrie in Treatment: 4 Vital Signs Time Taken: 09:02 Temperature (F): 97.7 Height (in): 68 Pulse (bpm): 85 Kosek, Jamorris J ([id number]) [phone].pdf Page 8 of 8 Weight (lbs): 574 Respiratory Rate (breaths/min): 20 Body Mass Index (BMI): 87.3 Blood Pressure (mmHg): 150/80 Reference Range: 80 - 120 mg / dl Electronic Signature(s) Signed: 07/05/2022 12:41:16 PM By: Deon Pilling RN, BSN Entered By: Deon Pilling on 07/05/2022 09:05:29

## 2022-07-12 ENCOUNTER — Encounter (HOSPITAL_BASED_OUTPATIENT_CLINIC_OR_DEPARTMENT_OTHER): Payer: Commercial Managed Care - HMO | Admitting: Internal Medicine

## 2022-07-15 ENCOUNTER — Other Ambulatory Visit: Payer: Self-pay | Admitting: Internal Medicine

## 2022-07-15 DIAGNOSIS — Z79899 Other long term (current) drug therapy: Secondary | ICD-10-CM

## 2022-07-15 DIAGNOSIS — D862 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: Secondary | ICD-10-CM

## 2022-07-15 NOTE — Telephone Encounter (Signed)
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 14 days.   Labs: 04/30/2022 Lab results look fine for continuing the current Humira treatment with normal CBC and CMP.   TB Gold: 08/27/2021 Neg    Patient to update labs at upcoming appointment on 08/01/2022  Okay to refill Humira?

## 2022-07-19 ENCOUNTER — Telehealth: Payer: Self-pay | Admitting: *Deleted

## 2022-07-19 NOTE — Telephone Encounter (Signed)
Urine drug screen for this encounter is consistent for prescribed medication 

## 2022-07-22 ENCOUNTER — Encounter: Payer: Commercial Managed Care - HMO | Attending: Physical Medicine and Rehabilitation | Admitting: Registered Nurse

## 2022-07-22 ENCOUNTER — Encounter: Payer: Self-pay | Admitting: Registered Nurse

## 2022-07-22 VITALS — BP 142/80 | HR 62 | Ht 68.0 in | Wt >= 6400 oz

## 2022-07-22 DIAGNOSIS — Z5181 Encounter for therapeutic drug level monitoring: Secondary | ICD-10-CM | POA: Insufficient documentation

## 2022-07-22 DIAGNOSIS — M48062 Spinal stenosis, lumbar region with neurogenic claudication: Secondary | ICD-10-CM | POA: Insufficient documentation

## 2022-07-22 DIAGNOSIS — M25562 Pain in left knee: Secondary | ICD-10-CM | POA: Diagnosis present

## 2022-07-22 DIAGNOSIS — Z79891 Long term (current) use of opiate analgesic: Secondary | ICD-10-CM | POA: Insufficient documentation

## 2022-07-22 DIAGNOSIS — G8929 Other chronic pain: Secondary | ICD-10-CM | POA: Diagnosis present

## 2022-07-22 DIAGNOSIS — G894 Chronic pain syndrome: Secondary | ICD-10-CM | POA: Insufficient documentation

## 2022-07-22 DIAGNOSIS — M25561 Pain in right knee: Secondary | ICD-10-CM | POA: Insufficient documentation

## 2022-07-22 MED ORDER — OXYCODONE-ACETAMINOPHEN 10-325 MG PO TABS: 1.0000 | ORAL_TABLET | Freq: Four times a day (QID) | ORAL | 0 refills | Status: DC | PRN

## 2022-07-22 NOTE — Progress Notes (Signed)
Subjective:    Patient ID: Anthony [name], male    DOB: 03-Nov-1980, 42 y.o.   MRN: [id number]  HPI: Anthony [name] is a 42 y.o. male who returns for follow up appointment for chronic pain and medication refill. He states his pain is located in his lower back and bilateral knee pain. He rates his pain 7. His current exercise regime is walking and performing stretching exercises.  Mr. [name] Morphine equivalent is 60.00 MME.   Last UDS was Performed on 06/25/2022, it was consistent.     Pain Inventory Average Pain 7 Pain Right Now 7 My pain is sharp, burning, dull, stabbing, and aching  In the last 24 hours, has pain interfered with the following? General activity 10 Relation with others 10 Enjoyment of life 10 What TIME of day is your pain at its worst? morning , daytime, evening, and night Sleep (in general) Poor  Pain is worse with: walking, bending, sitting, inactivity, and standing Pain improves with: rest, heat/ice, and medication Relief from Meds: 5  Family History  Problem Relation Age of Onset   Hypertension Father    Diabetes Father    Stroke Father    Congestive Heart Failure Father    Lymphoma Paternal Uncle    Ovarian cancer Maternal Grandmother 23   Social History   Socioeconomic History   Marital status: Married    Spouse name: Not on file   Number of children: Not on file   Years of education: Not on file   Highest education level: Not on file  Occupational History   Occupation: Disabled  Tobacco Use   Smoking status: Former    Packs/day: 0.30    Years: 10.00    Total pack years: 3.00    Types: Cigarettes    Quit date: 09/30/2016    Years since quitting: 5.8    Passive exposure: Never   Smokeless tobacco: Never  Vaping Use   Vaping Use: Never used  Substance and Sexual Activity   Alcohol use: No   Drug use: No   Sexual activity: Yes    Birth control/protection: Condom  Other Topics Concern   Not on file  Social History Narrative   Not  on file   Social Determinants of Health   Financial Resource Strain: Not on file  Food Insecurity: Not on file  Transportation Needs: Not on file  Physical Activity: Not on file  Stress: Not on file  Social Connections: Not on file   Past Surgical History:  Procedure Laterality Date   ARTHROSCOPY KNEE W/ DRILLING     right knee    ENDOBRONCHIAL ULTRASOUND Bilateral 03/04/2017   Procedure: ENDOBRONCHIAL ULTRASOUND;  Surgeon: Collene Gobble, MD;  Location: [facility];  Service: Cardiopulmonary;  Laterality: Bilateral;   left knee inner growth plate removed     to straighten leg   Past Surgical History:  Procedure Laterality Date   ARTHROSCOPY KNEE W/ DRILLING     right knee    ENDOBRONCHIAL ULTRASOUND Bilateral 03/04/2017   Procedure: ENDOBRONCHIAL ULTRASOUND;  Surgeon: Collene Gobble, MD;  Location: [facility];  Service: Cardiopulmonary;  Laterality: Bilateral;   left knee inner growth plate removed     to straighten leg   Past Medical History:  Diagnosis Date   Anxiety    Depression    Dyspnea    Eczema of hand    Headache    hx of with tooth pain   Obese    Pneumonia  12/2016   Pre-diabetes    Sarcoidosis    Sleep apnea    cpap   BP (!) 166/76   Pulse 74   Ht '5\' 8"'$  (1.727 m)   Wt (!) 567 lb (257.2 kg)   SpO2 93%   BMI 86.21 kg/m   Opioid Risk Score:   Fall Risk Score:  `1  Depression screen [facility] 2/9     07/22/2022    9:53 AM 05/27/2022    9:09 AM 04/30/2022    2:35 PM 03/27/2022    9:06 AM 02/25/2022    8:43 AM 01/29/2022    8:38 AM 12/31/2021    9:02 AM  Depression screen PHQ 2/9  Decreased Interest 0 0 0 0 0 0 0  Down, Depressed, Hopeless 0 0 0 0 0 0 0  PHQ - 2 Score 0 0 0 0 0 0 0     Review of Systems  Constitutional: Negative.   HENT: Negative.    Eyes: Negative.   Respiratory: Negative.    Cardiovascular: Negative.   Gastrointestinal: Negative.   Endocrine: Negative.   Genitourinary: Negative.   Musculoskeletal:  Positive for  arthralgias and back pain.  Skin: Negative.   Allergic/Immunologic: Negative.   Neurological: Negative.   Hematological: Negative.   Psychiatric/Behavioral: Negative.    All other systems reviewed and are negative.      Objective:   Physical Exam Vitals and nursing note reviewed.  Constitutional:      Appearance: Normal appearance. He is obese.  Cardiovascular:     Rate and Rhythm: Normal rate and regular rhythm.     Pulses: Normal pulses.     Heart sounds: Normal heart sounds.  Pulmonary:     Effort: Pulmonary effort is normal.     Breath sounds: Normal breath sounds.  Musculoskeletal:     Cervical back: Normal range of motion and neck supple.     Comments: Normal Muscle Bulk and Muscle Testing Reveals:  Upper Extremities: Full ROM and Muscle Strength 5/5  Thoracic Paraspinal  Tenderness: T-1-T-3 Lumbar Paraspinal Tenderness: L-3-L-5 Lower Extremities: Full ROM and Muscle Strength 5/5 Arises from Table slowly Narrow Based  Gait     Skin:    General: Skin is warm and dry.  Neurological:     Mental Status: He is alert and oriented to person, place, and time.  Psychiatric:        Mood and Affect: Mood normal.        Behavior: Behavior normal.         Assessment & Plan:  1.Right Lumbar Radiculitis/ Spinal Stenosis : Continue HEP as Tolerated and Continue to Monitor. 07/22/2022 Refilled: :Oxycodone 10/325 mg one tablet 4 times a day as needed for pain #120. We will continue the opioid monitoring program, this consists of regular clinic visits, examinations, urine drug screen, pill counts as well as use of [place] Controlled Substance Reporting system. A 12 month History has been reviewed on the [place] on 07/22/2022.  2. Thoracic Back Pain: No complaints today.Continue HEP as Tolerated . Continue to Monitor. 07/22/2022 3. Right Shoulder Pain: No Complaints today. Continue HEP as Tolerated and Continue to  Monitor.[id number] 4. Chronic Pain Syndrome: Continue HEP as Tolerated. Continue to Monitor. 07/22/2022 5. Morbid Obesity: Continue Healthy Diet Regimen. Continue to Monitor. 07/22/2022 6. Chronic Bilateral Knee Pain:Continue HEP as Tolerated. Continue to Monitor. 07/22/2022 7. Left Ankle Pain: No complaints today.  Ortho Following.    F/U in 1 Month

## 2022-08-01 ENCOUNTER — Ambulatory Visit: Payer: Commercial Managed Care - HMO | Admitting: Internal Medicine

## 2022-08-01 NOTE — Progress Notes (Deleted)
Office Visit Note  Patient: Anthony [name]             Date of Birth: 12-May-1981           MRN: [id number]             PCP: Pcp, No Referring: No ref. provider found Visit Date: 08/01/2022   Subjective:  No chief complaint on file.   History of Present Illness: Anthony [name] is a 42 y.o. male here for follow up ***   Previous HPI 04/30/22 Anthony [name] is a 42 y.o. 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 Prednisone was tapered down form 15 mg after last visit with pulmonology clinic and lung disease appeared controlled without new symptom or image findings.  However subsequently he sustained a fall with right knee and left ankle pain significantly increased afterwards.  He has been in an ankle boot for immobilization did not require any procedure.  He was concerned about his injury susceptibility with long-term prednisone use and discontinued since about 1 month ago.  He did not notice new pulmonary symptoms when stopping the prednisone.  Joint pain has been problematic mostly in the bilateral knees he is taking diclofenac twice daily which helps a lot otherwise he requires a cane for walking due to knee pain.  Skin rashes have increased noticeably on both palms.  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  He does admit to somewhat inconsistent adherence to the Humira often taking some doses late.   Previous HPI 12/25/21 Anthony [name] is a 42 y.o. 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 14 days and prednisone 15 mg daily.  He had some delay in getting the Humira so only started this at home more recently reports taking the fourth dose today before clinic.  He  had updated CT imaging of the chest about a week ago that showed resolution of his mediastinal lymphadenopathy but did demonstrate some persistent mild scarring changes.  Back and knee pain symptoms have been stable about the same as before reasonably controlled on his pain medication but definitely limits his mobility does not tolerate walking long distance due to back pain.   Previous HPI 09/18/2021 Anthony [name] is a 43 y.o. 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 He is feeling about the same as at our last visit still join pain worst in bilateral knees and skin rash is present on both palms. Labs from last visit were mostly normal although also showed low TPMT function test.   Previous HPI 08/27/2021 Anthony [name] is a 42 y.o. 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 He previously took methotrexate without definite symptom control. Recently increased back to 30 mg prednisone tapering. He has some chronic joint pain in multiple areas including chronic rotator cuff tear and lumbar spine DJD.  He has longstanding disease with chronic joint pain in both knees since he was a teenager. This has been attributed to use related and also found to have significant OA on xrays from a few years ago.  Skin disease is also been chronic mostly affecting the palms of bilateral hands.  However starting around 2018 he developed shortness of breath symptoms as well as worsening skin inflammation this led to original diagnosis of sarcoidosis.  He has received off-and-on treatments of prednisone which improved his symptoms significantly.  He did take methotrexate however feels like his joints and skin may have been not improved or possibly worse when on the medication definitely do not see an improvement in symptoms.  More recently in the past year he is also developed some hair loss and pigmentation changes on the scalp.  He has hyperpigmented  skin patches on distal legs and developed a right leg ulcer this has been healing over the past 3 weeks following up with wound care treatment is also been on the prednisone during this time.  The most recent respiratory symptom exacerbation in the past few months now on prednisone for least about 6 weeks slowly tapering.  He was recommended for updated chest CT scan is had be rescheduled to a different site for machine weight capacity limit he has not yet gotten this rescheduled again. He denies any involvement of skin on soles of the feet like he has on his hands.  He has used hypotensive topical steroids for this in the past but not recently.  He denies any eye inflammation problems or vision changes.   09/10/2017 CT Chest IMPRESSION: 1. Marked decrease in size of RIGHT perihilar pulmonary parenchymal nodules and LEFT upper lobe nodule with linear platelike scarring remaining. 2. Mild mediastinal adenopathy is similar to slightly decreased.   No Rheumatology ROS completed.   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[place]) 02/24/2017   Reactive depression 01/16/2017   Anticoagulated by anticoagulation treatment 01/16/2017    Past Medical History:  Diagnosis Date   Anxiety    Depression    Dyspnea    Eczema of hand    Headache    hx of with tooth pain   Obese    Pneumonia    12/2016   Pre-diabetes    Sarcoidosis    Sleep apnea    cpap    Family History  Problem Relation Age of Onset   Hypertension Father    Diabetes Father    Stroke Father     Congestive Heart Failure Father    Lymphoma Paternal Uncle    Ovarian cancer Maternal Grandmother 31   Past Surgical History:  Procedure Laterality Date   ARTHROSCOPY KNEE W/ DRILLING     right knee    ENDOBRONCHIAL ULTRASOUND Bilateral 03/04/2017   Procedure: ENDOBRONCHIAL ULTRASOUND;  Surgeon: Collene Gobble, MD;  Location: [facility];  Service: Cardiopulmonary;  Laterality: Bilateral;   left knee inner growth plate removed     to straighten leg   Social History   Social History Narrative   Not on file    There is no immunization history on file for this patient.   Objective: Vital Signs: There were no vitals taken for this visit.   Physical Exam   Musculoskeletal Exam: ***  CDAI Exam: CDAI Score: -- Patient Global: --; Provider Global: -- Swollen: --;  Tender: -- Joint Exam 08/01/2022   No joint exam has been documented for this visit   There is currently no information documented on the homunculus. Go to the Rheumatology activity and complete the homunculus joint exam.  Investigation: No additional findings.  Imaging: No results found.  Recent Labs: Lab Results  Component Value Date   WBC 5.0 04/30/2022   HGB 15.0 04/30/2022   PLT 254 04/30/2022   NA 138 04/30/2022   K 4.6 04/30/2022   CL 103 04/30/2022   CO2 29 04/30/2022   GLUCOSE 89 04/30/2022   BUN 9 04/30/2022   CREATININE 0.92 04/30/2022   BILITOT 0.5 04/30/2022   ALKPHOS 66 05/10/2021   AST 28 04/30/2022   ALT 44 04/30/2022   PROT 7.3 04/30/2022   ALBUMIN 4.1 05/10/2021   CALCIUM 9.3 04/30/2022   GFRAA >60 01/02/2017   QFTBGOLDPLUS NEGATIVE 08/27/2021    Speciality Comments: No specialty comments available.  Procedures:  No procedures performed Allergies: Xarelto [rivaroxaban]   Assessment / Plan:     Visit Diagnoses: No diagnosis found.  ***  Orders: No orders of the defined types were placed in this encounter.  No orders of the defined types were placed in this  encounter.    Follow-Up Instructions: No follow-ups on file.   Collier Salina, MD  Note - This record has been created using [employer].  Chart creation errors have been sought, but may not always  have been located. Such creation errors do not reflect on  the standard of medical care.

## 2022-08-21 ENCOUNTER — Encounter: Payer: Commercial Managed Care - HMO | Attending: Physical Medicine and Rehabilitation | Admitting: Registered Nurse

## 2022-08-21 ENCOUNTER — Encounter: Payer: Self-pay | Admitting: Registered Nurse

## 2022-08-21 VITALS — BP 137/82 | HR 66 | Ht 68.0 in | Wt >= 6400 oz

## 2022-08-21 DIAGNOSIS — Z5181 Encounter for therapeutic drug level monitoring: Secondary | ICD-10-CM | POA: Insufficient documentation

## 2022-08-21 DIAGNOSIS — M25562 Pain in left knee: Secondary | ICD-10-CM | POA: Diagnosis present

## 2022-08-21 DIAGNOSIS — Z79891 Long term (current) use of opiate analgesic: Secondary | ICD-10-CM | POA: Diagnosis present

## 2022-08-21 DIAGNOSIS — G894 Chronic pain syndrome: Secondary | ICD-10-CM | POA: Diagnosis not present

## 2022-08-21 DIAGNOSIS — M48062 Spinal stenosis, lumbar region with neurogenic claudication: Secondary | ICD-10-CM | POA: Diagnosis not present

## 2022-08-21 DIAGNOSIS — G8929 Other chronic pain: Secondary | ICD-10-CM | POA: Diagnosis present

## 2022-08-21 DIAGNOSIS — M25561 Pain in right knee: Secondary | ICD-10-CM | POA: Insufficient documentation

## 2022-08-21 MED ORDER — OXYCODONE-ACETAMINOPHEN 10-325 MG PO TABS: 1.0000 | ORAL_TABLET | Freq: Four times a day (QID) | ORAL | 0 refills | Status: DC | PRN

## 2022-08-21 NOTE — Progress Notes (Signed)
Subjective:    Patient ID: Anthony [name], male    DOB: 21-Feb-1981, 42 y.o.   MRN: [id number]  HPI: Anthony [name] is a 42 y.o. male who returns for follow up appointment for chronic pain and medication refill. He states his pain is located in his lower back and bilateral knees. He rates his pain 6. His current exercise regime is walking and performing stretching exercises.  Anthony [name] Morphine equivalent is 60.00 MME.   Last UDS was Performed on 06/25/2022, it was consistent.    Pain Inventory Average Pain 8 Pain Right Now 6 My pain is sharp, burning, dull, stabbing, and aching  In the last 24 hours, has pain interfered with the following? General activity 10 Relation with others 10 Enjoyment of life 10 What TIME of day is your pain at its worst? morning , daytime, evening, and night Sleep (in general) Fair  Pain is worse with: walking, bending, sitting, inactivity, and standing Pain improves with: rest and medication Relief from Meds: 4  Family History  Problem Relation Age of Onset   Hypertension Father    Diabetes Father    Stroke Father    Congestive Heart Failure Father    Lymphoma Paternal Uncle    Ovarian cancer Maternal Grandmother 10   Social History   Socioeconomic History   Marital status: Married    Spouse name: Not on file   Number of children: Not on file   Years of education: Not on file   Highest education level: Not on file  Occupational History   Occupation: Disabled  Tobacco Use   Smoking status: Former    Packs/day: 0.30    Years: 10.00    Additional pack years: 0.00    Total pack years: 3.00    Types: Cigarettes    Quit date: 09/30/2016    Years since quitting: 5.8    Passive exposure: Never   Smokeless tobacco: Never  Vaping Use   Vaping Use: Never used  Substance and Sexual Activity   Alcohol use: No   Drug use: No   Sexual activity: Yes    Birth control/protection: Condom  Other Topics Concern   Not on file  Social History  Narrative   Not on file   Social Determinants of Health   Financial Resource Strain: Not on file  Food Insecurity: Not on file  Transportation Needs: Not on file  Physical Activity: Not on file  Stress: Not on file  Social Connections: Not on file   Past Surgical History:  Procedure Laterality Date   ARTHROSCOPY KNEE W/ DRILLING     right knee    ENDOBRONCHIAL ULTRASOUND Bilateral 03/04/2017   Procedure: ENDOBRONCHIAL ULTRASOUND;  Surgeon: Leslye Peer, MD;  Location: [facility];  Service: Cardiopulmonary;  Laterality: Bilateral;   left knee inner growth plate removed     to straighten leg   Past Surgical History:  Procedure Laterality Date   ARTHROSCOPY KNEE W/ DRILLING     right knee    ENDOBRONCHIAL ULTRASOUND Bilateral 03/04/2017   Procedure: ENDOBRONCHIAL ULTRASOUND;  Surgeon: Leslye Peer, MD;  Location: [facility];  Service: Cardiopulmonary;  Laterality: Bilateral;   left knee inner growth plate removed     to straighten leg   Past Medical History:  Diagnosis Date   Anxiety    Depression    Dyspnea    Eczema of hand    Headache    hx of with tooth pain   Obese  Pneumonia    12/2016   Pre-diabetes    Sarcoidosis    Sleep apnea    cpap   BP (!) 149/88   Pulse 66   Ht 5\' 8"  (1.727 m)   Wt (!) 561 lb 6.4 oz (254.6 kg)   SpO2 95%   BMI 85.36 kg/m   Opioid Risk Score:   Fall Risk Score:  `1  Depression screen [facility] 2/9     07/22/2022    9:53 AM 05/27/2022    9:09 AM 04/30/2022    2:35 PM 03/27/2022    9:06 AM 02/25/2022    8:43 AM 01/29/2022    8:38 AM 12/31/2021    9:02 AM  Depression screen PHQ 2/9  Decreased Interest 0 0 0 0 0 0 0  Down, Depressed, Hopeless 0 0 0 0 0 0 0  PHQ - 2 Score 0 0 0 0 0 0 0      Review of Systems  Musculoskeletal:  Positive for back pain.       Right leg pain Bilateral knee pain  All other systems reviewed and are negative.     Objective:   Physical Exam Vitals and nursing note reviewed.   Constitutional:      Appearance: Normal appearance. He is obese.  Cardiovascular:     Rate and Rhythm: Normal rate and regular rhythm.     Pulses: Normal pulses.     Heart sounds: Normal heart sounds.  Pulmonary:     Effort: Pulmonary effort is normal.     Breath sounds: Normal breath sounds.  Musculoskeletal:     Cervical back: Normal range of motion and neck supple.     Comments: Normal Muscle Bulk and Muscle Testing Reveals:  Upper Extremities: Full ROM and Muscle Strength 5/5 Lower Extremities: Full ROM and Muscle Strength 5/5 Bilateral Lower Extremity Flexion Produces Pain into her Bilateral Patella's Arises from Table Slowly Antalgic  Gait     Skin:    General: Skin is warm and dry.  Neurological:     Mental Status: He is alert and oriented to person, place, and time.  Psychiatric:        Mood and Affect: Mood normal.        Behavior: Behavior normal.         Assessment & Plan:  1.Right Lumbar Radiculitis/ Spinal Stenosis : Continue HEP as Tolerated and Continue to Monitor. 08/21/2022 Refilled: :Oxycodone 10/325 mg one tablet 4 times a day as needed for pain #120. We will continue the opioid monitoring program, this consists of regular clinic visits, examinations, urine drug screen, pill counts as well as use of [place] Controlled Substance Reporting system. A 12 month History has been reviewed on the [place] Controlled Substance Reporting System on 08/21/2022.  2. Thoracic Back Pain: No complaints today.Continue HEP as Tolerated . Continue to Monitor. 08/21/2022 3. Right Shoulder Pain: No Complaints today. Continue HEP as Tolerated and Continue to Monitor.04/102024 4. Chronic Pain Syndrome: Continue HEP as Tolerated. Continue to Monitor. 08/21/2022 5. Morbid Obesity: Continue Healthy Diet Regimen. Continue to Monitor. 08/21/2022 6. Chronic Bilateral Knee Pain:Continue HEP as Tolerated. Continue to Monitor. 08/21/2022 7. Left Ankle Pain: No complaints  today.  Ortho Following.    F/U in 1 Month

## 2022-09-13 ENCOUNTER — Telehealth: Payer: Self-pay | Admitting: Pharmacist

## 2022-09-13 ENCOUNTER — Other Ambulatory Visit (HOSPITAL_COMMUNITY): Payer: Self-pay

## 2022-09-13 NOTE — Telephone Encounter (Addendum)
Received fax from [employer] that patient's Humira PA is set to expire however I was unable to find any active insurance through [place] or [facility] eligibility check. Old plan is inactive per attempt to renew original CMM PA key.  MyChart message sent to patient for insurance information. Per dispense history, Humira has not been filled since the end of Feb 2024 for 28 day supply only.  If uninsured, will have to apply for patient assistance throuhg [employer]  Chesley Mires, PharmD, MPH, BCPS, CPP Clinical Pharmacist (Rheumatology and Pulmonology)

## 2022-09-18 ENCOUNTER — Encounter: Payer: Self-pay | Admitting: Registered Nurse

## 2022-09-18 ENCOUNTER — Encounter: Payer: Commercial Managed Care - HMO | Attending: Physical Medicine and Rehabilitation | Admitting: Registered Nurse

## 2022-09-18 VITALS — BP 148/89 | HR 67 | Ht 68.0 in | Wt >= 6400 oz

## 2022-09-18 DIAGNOSIS — M25561 Pain in right knee: Secondary | ICD-10-CM | POA: Insufficient documentation

## 2022-09-18 DIAGNOSIS — Z79891 Long term (current) use of opiate analgesic: Secondary | ICD-10-CM | POA: Insufficient documentation

## 2022-09-18 DIAGNOSIS — M25562 Pain in left knee: Secondary | ICD-10-CM

## 2022-09-18 DIAGNOSIS — G894 Chronic pain syndrome: Secondary | ICD-10-CM | POA: Insufficient documentation

## 2022-09-18 DIAGNOSIS — G8929 Other chronic pain: Secondary | ICD-10-CM | POA: Insufficient documentation

## 2022-09-18 DIAGNOSIS — M48062 Spinal stenosis, lumbar region with neurogenic claudication: Secondary | ICD-10-CM | POA: Insufficient documentation

## 2022-09-18 DIAGNOSIS — Z5181 Encounter for therapeutic drug level monitoring: Secondary | ICD-10-CM

## 2022-09-18 MED ORDER — OXYCODONE-ACETAMINOPHEN 10-325 MG PO TABS: 1.0000 | ORAL_TABLET | Freq: Four times a day (QID) | ORAL | 0 refills | Status: DC | PRN

## 2022-09-18 NOTE — Progress Notes (Signed)
Subjective:    Patient ID: Anthony [name], male    DOB: January 26, 1981, 42 y.o.   MRN: [id number]  HPI: Anthony [name] is a 42 y.o. male who returns for follow up appointment for chronic pain and medication refill. He states his pain is located in his lower back and bilateral knee pain. He rates his pain 7. His current exercise regime is walking and performing stretching exercises.  Anthony [name] Morphine equivalent is 60.00 MME.   Last UDS was Performed on 06/25/2022, it was consistent.    Pain Inventory Average Pain 7 Pain Right Now 7 My pain is sharp, burning, dull, stabbing, and aching  In the last 24 hours, has pain interfered with the following? General activity 10 Relation with others 10 Enjoyment of life 10 What TIME of day is your pain at its worst? morning , daytime, evening, and night Sleep (in general) Poor  Pain is worse with: walking, bending, sitting, inactivity, and standing Pain improves with: rest, heat/ice, and medication Relief from Meds: 5  Family History  Problem Relation Age of Onset   Hypertension Father    Diabetes Father    Stroke Father    Congestive Heart Failure Father    Lymphoma Paternal Uncle    Ovarian cancer Maternal Grandmother 69   Social History   Socioeconomic History   Marital status: Married    Spouse name: Not on file   Number of children: Not on file   Years of education: Not on file   Highest education level: Not on file  Occupational History   Occupation: Disabled  Tobacco Use   Smoking status: Former    Packs/day: 0.30    Years: 10.00    Additional pack years: 0.00    Total pack years: 3.00    Types: Cigarettes    Quit date: 09/30/2016    Years since quitting: 5.9    Passive exposure: Never   Smokeless tobacco: Never  Vaping Use   Vaping Use: Never used  Substance and Sexual Activity   Alcohol use: No   Drug use: No   Sexual activity: Yes    Birth control/protection: Condom  Other Topics Concern   Not on file   Social History Narrative   Not on file   Social Determinants of Health   Financial Resource Strain: Not on file  Food Insecurity: Not on file  Transportation Needs: Not on file  Physical Activity: Not on file  Stress: Not on file  Social Connections: Not on file   Past Surgical History:  Procedure Laterality Date   ARTHROSCOPY KNEE W/ DRILLING     right knee    ENDOBRONCHIAL ULTRASOUND Bilateral 03/04/2017   Procedure: ENDOBRONCHIAL ULTRASOUND;  Surgeon: Leslye Peer, MD;  Location: [facility];  Service: Cardiopulmonary;  Laterality: Bilateral;   left knee inner growth plate removed     to straighten leg   Past Surgical History:  Procedure Laterality Date   ARTHROSCOPY KNEE W/ DRILLING     right knee    ENDOBRONCHIAL ULTRASOUND Bilateral 03/04/2017   Procedure: ENDOBRONCHIAL ULTRASOUND;  Surgeon: Leslye Peer, MD;  Location: [facility];  Service: Cardiopulmonary;  Laterality: Bilateral;   left knee inner growth plate removed     to straighten leg   Past Medical History:  Diagnosis Date   Anxiety    Depression    Dyspnea    Eczema of hand    Headache    hx of with tooth pain   Obese  Pneumonia    12/2016   Pre-diabetes    Sarcoidosis    Sleep apnea    cpap   BP (!) 148/89   Pulse 67   Ht 5\' 8"  (1.727 m)   Wt (!) 554 lb 3.2 oz (251.4 kg)   SpO2 97%   BMI 84.27 kg/m   Opioid Risk Score:   Fall Risk Score:  `1  Depression screen [facility] 2/9     07/22/2022    9:53 AM 05/27/2022    9:09 AM 04/30/2022    2:35 PM 03/27/2022    9:06 AM 02/25/2022    8:43 AM 01/29/2022    8:38 AM 12/31/2021    9:02 AM  Depression screen PHQ 2/9  Decreased Interest 0 0 0 0 0 0 0  Down, Depressed, Hopeless 0 0 0 0 0 0 0  PHQ - 2 Score 0 0 0 0 0 0 0      Review of Systems  Musculoskeletal:  Positive for back pain and gait problem.       B/L knee pain   All other systems reviewed and are negative.     Objective:   Physical Exam Vitals and nursing note  reviewed.  Constitutional:      Appearance: Normal appearance. He is obese.  Cardiovascular:     Rate and Rhythm: Normal rate and regular rhythm.     Pulses: Normal pulses.     Heart sounds: Normal heart sounds.  Pulmonary:     Effort: Pulmonary effort is normal.     Breath sounds: Normal breath sounds. No stridor.  Musculoskeletal:     Cervical back: Normal range of motion and neck supple.     Comments: Normal Muscle Bulk and Muscle Testing Reveals:  Upper Extremities: Full ROM and Muscle Strength 5/5  Lumbar Paraspinal Tenderness: L-3-L-5 Lower Extremities: Full ROM and Muscle Strength 5/5 Arises from Table slowly Antalgic  Gait     Skin:    General: Skin is warm and dry.  Neurological:     Mental Status: He is alert and oriented to person, place, and time.  Psychiatric:        Mood and Affect: Mood normal.        Behavior: Behavior normal.         Assessment & Plan:  1.Right Lumbar Radiculitis/ Spinal Stenosis : Continue HEP as Tolerated and Continue to Monitor. 09/18/2022 Refilled: :Oxycodone 10/325 mg one tablet 4 times a day as needed for pain #120. We will continue the opioid monitoring program, this consists of regular clinic visits, examinations, urine drug screen, pill counts as well as use of [place] Controlled Substance Reporting system. A 12 month History has been reviewed on the [place] Controlled Substance Reporting System on 09/18/2022.  2. Thoracic Back Pain: No complaints today.Continue HEP as Tolerated . Continue to Monitor. 09/18/2022 3. Right Shoulder Pain: No Complaints today. Continue HEP as Tolerated and Continue to Monitor.05/082024 4. Chronic Pain Syndrome: Continue HEP as Tolerated. Continue to Monitor. 09/18/2022 5. Morbid Obesity: Continue Healthy Diet Regimen. Continue to Monitor. 09/18/2022 6. Chronic Bilateral Knee Pain:Continue HEP as Tolerated. Continue to Monitor. 09/18/2022 7. Left Ankle Pain: No complaints today.  Ortho  Following.    F/U in 1 Month

## 2022-09-19 NOTE — Telephone Encounter (Signed)
ATC patient regarding insurance status. Unable to reach and unable to leave VM because VM box is full. Another MyChart message has been sent to patient. If no respones by 10/03/22, will close encounter.  Chesley Mires, PharmD, MPH, BCPS, CPP Clinical Pharmacist (Rheumatology and Pulmonology)

## 2022-10-03 ENCOUNTER — Other Ambulatory Visit: Payer: Self-pay | Admitting: Physical Medicine and Rehabilitation

## 2022-10-03 NOTE — Telephone Encounter (Signed)
Due to lack of f/u from patient, will be closing encounter and further follow-up. Please let pharmacy know if further assistance is needed  Chesley Mires, PharmD, MPH, BCPS, CPP Clinical Pharmacist (Rheumatology and Pulmonology)

## 2022-10-17 ENCOUNTER — Encounter: Payer: Self-pay | Attending: Physical Medicine and Rehabilitation | Admitting: Registered Nurse

## 2022-10-17 ENCOUNTER — Encounter: Payer: Self-pay | Admitting: Registered Nurse

## 2022-10-17 VITALS — BP 129/78 | HR 74 | Ht 68.0 in | Wt >= 6400 oz

## 2022-10-17 DIAGNOSIS — M25562 Pain in left knee: Secondary | ICD-10-CM | POA: Insufficient documentation

## 2022-10-17 DIAGNOSIS — G8929 Other chronic pain: Secondary | ICD-10-CM

## 2022-10-17 DIAGNOSIS — G894 Chronic pain syndrome: Secondary | ICD-10-CM

## 2022-10-17 DIAGNOSIS — Z79891 Long term (current) use of opiate analgesic: Secondary | ICD-10-CM

## 2022-10-17 DIAGNOSIS — M5416 Radiculopathy, lumbar region: Secondary | ICD-10-CM

## 2022-10-17 DIAGNOSIS — M48062 Spinal stenosis, lumbar region with neurogenic claudication: Secondary | ICD-10-CM

## 2022-10-17 DIAGNOSIS — Z5181 Encounter for therapeutic drug level monitoring: Secondary | ICD-10-CM

## 2022-10-17 DIAGNOSIS — M25561 Pain in right knee: Secondary | ICD-10-CM | POA: Insufficient documentation

## 2022-10-17 MED ORDER — OXYCODONE-ACETAMINOPHEN 10-325 MG PO TABS: 1.0000 | ORAL_TABLET | Freq: Four times a day (QID) | ORAL | 0 refills | Status: DC | PRN

## 2022-10-17 NOTE — Progress Notes (Signed)
Subjective:    Patient ID: Anthony [name], male    DOB: 01/30/1981, 42 y.o.   MRN: [id number]  HPI: Anthony [name] is a 42 y.o. male who returns for follow up appointment for chronic pain and medication refill. He states his pain is located in his lower back radiating into his right lower extremities and bilateral knee pain. She rates her pain 8. Her current exercise regime is walking and performing stretching exercises.  Ms. Lopeman Morphine equivalent is 60.00 MME.  Mr. 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 The hotel is in the process of trying to locate his personal items along with his medication bag. He was instructed to call the [facility] again today, he verbalizes understanding.    Last UDS was Performed on 06/25/2022, it was consistent.    Pain Inventory Average Pain 7 Pain Right Now 8 My pain is sharp, burning, dull, stabbing, tingling, and aching  In the last 24 hours, has pain interfered with the following? General activity 10 Relation with others 10 Enjoyment of life 10 What TIME of day is your pain at its worst? morning , daytime, evening, and night Sleep (in general) Poor  Pain is worse with: walking, bending, sitting, inactivity, and standing Pain improves with: rest, heat/ice, and medication Relief from Meds: 5  Family History  Problem Relation Age of Onset   Hypertension Father    Diabetes Father    Stroke Father    Congestive Heart Failure Father    Lymphoma Paternal Uncle    Ovarian cancer Maternal Grandmother 86   Social History   Socioeconomic History   Marital status: Married    Spouse name: Not on file   Number of children: Not on file   Years of education: Not on file   Highest education level: Not on file  Occupational History   Occupation: Disabled  Tobacco Use   Smoking status: Former    Packs/day: 0.30    Years: 10.00    Additional pack years: 0.00     Total pack years: 3.00    Types: Cigarettes    Quit date: 09/30/2016    Years since quitting: 6.0    Passive exposure: Never   Smokeless tobacco: Never  Vaping Use   Vaping Use: Never used  Substance and Sexual Activity   Alcohol use: No   Drug use: No   Sexual activity: Yes    Birth control/protection: Condom  Other Topics Concern   Not on file  Social History Narrative   Not on file   Social Determinants of Health   Financial Resource Strain: Not on file  Food Insecurity: Not on file  Transportation Needs: Not on file  Physical Activity: Not on file  Stress: Not on file  Social Connections: Not on file   Past Surgical History:  Procedure Laterality Date   ARTHROSCOPY KNEE W/ DRILLING     right knee    ENDOBRONCHIAL ULTRASOUND Bilateral 03/04/2017   Procedure: ENDOBRONCHIAL ULTRASOUND;  Surgeon: Leslye Peer, MD;  Location: [facility];  Service: Cardiopulmonary;  Laterality: Bilateral;   left knee inner growth plate removed     to straighten leg   Past Surgical History:  Procedure Laterality Date   ARTHROSCOPY KNEE W/ DRILLING     right knee    ENDOBRONCHIAL ULTRASOUND Bilateral 03/04/2017   Procedure: ENDOBRONCHIAL ULTRASOUND;  Surgeon: Leslye Peer, MD;  Location: [facility];  Service: Cardiopulmonary;  Laterality: Bilateral;   left knee inner growth plate removed     to straighten leg   Past Medical History:  Diagnosis Date   Anxiety    Depression    Dyspnea    Eczema of hand    Headache    hx of with tooth pain   Obese    Pneumonia    12/2016   Pre-diabetes    Sarcoidosis    Sleep apnea    cpap   BP 129/78   Pulse 74   Ht 5\' 8"  (1.727 m)   Wt (!) 569 lb (258.1 kg)   SpO2 97%   BMI 86.52 kg/m   Opioid Risk Score:   Fall Risk Score:  `1  Depression screen [facility] 2/9     10/17/2022    9:55 AM 07/22/2022    9:53 AM 05/27/2022    9:09 AM 04/30/2022    2:35 PM 03/27/2022    9:06 AM 02/25/2022    8:43 AM 01/29/2022    8:38 AM   Depression screen PHQ 2/9  Decreased Interest 0 0 0 0 0 0 0  Down, Depressed, Hopeless 0 0 0 0 0 0 0  PHQ - 2 Score 0 0 0 0 0 0 0    Review of Systems  Musculoskeletal:  Positive for back pain and neck pain.       Pain both knees, pain from right hip down to right foot  All other systems reviewed and are negative.     Objective:   Physical Exam Vitals and nursing note reviewed.  Constitutional:      Appearance: Normal appearance. He is obese.  Cardiovascular:     Rate and Rhythm: Normal rate and regular rhythm.     Pulses: Normal pulses.     Heart sounds: Normal heart sounds.  Pulmonary:     Effort: Pulmonary effort is normal.     Breath sounds: Normal breath sounds.  Musculoskeletal:     Cervical back: Normal range of motion and neck supple.     Comments: Normal Muscle Bulk and Muscle Testing Reveals:  Upper Extremities: Full ROM and Muscle Strength 5/5  Thoracic Paraspinal Tenderness: T-1-T-3   Lumbar Paraspinal Tenderness: L-4-L-5 Lower Extremities: Decreased ROM and Muscle Strength 5/5 Arises from Table Slowly  Antalgic  Gait     Skin:    General: Skin is warm and dry.  Neurological:     Mental Status: He is alert and oriented to person, place, and time.  Psychiatric:        Mood and Affect: Mood normal.        Behavior: Behavior normal.         Assessment & Plan:  1.Right Lumbar Radiculitis/ Spinal Stenosis : Continue HEP as Tolerated and Continue to Monitor. 10/17/2022 Refilled: :Oxycodone 10/325 mg one tablet 4 times a day as needed for pain #120. We will continue the opioid monitoring program, this consists of regular clinic visits, examinations, urine drug screen, pill counts as well as use of [place] Controlled Substance Reporting system. A 12 month History has been reviewed on the [place] Controlled Substance Reporting System on 10/17/2022.  2. Thoracic Back Pain: No complaints today.Continue HEP as Tolerated . Continue to Monitor.  10/17/2022 3. Right Shoulder Pain: No Complaints today. Continue HEP as Tolerated and Continue to Monitor.06/062024 4. Chronic Pain Syndrome: Continue HEP as Tolerated. Continue to Monitor. 10/17/2022 5. Morbid Obesity: Continue Healthy Diet Regimen. Continue to Monitor. 10/17/2022 6.  Chronic Bilateral Knee Pain:Continue HEP as Tolerated. Continue to Monitor. 10/17/2022 7. Left Ankle Pain: No complaints today.  Ortho Following.    F/U in 1 Month

## 2022-11-15 ENCOUNTER — Encounter: Payer: Self-pay | Admitting: Registered Nurse

## 2022-11-15 ENCOUNTER — Encounter: Payer: Self-pay | Attending: Physical Medicine and Rehabilitation | Admitting: Registered Nurse

## 2022-11-15 VITALS — BP 154/73 | HR 78 | Ht 68.0 in | Wt >= 6400 oz

## 2022-11-15 DIAGNOSIS — M25561 Pain in right knee: Secondary | ICD-10-CM | POA: Insufficient documentation

## 2022-11-15 DIAGNOSIS — Z79891 Long term (current) use of opiate analgesic: Secondary | ICD-10-CM | POA: Insufficient documentation

## 2022-11-15 DIAGNOSIS — M48062 Spinal stenosis, lumbar region with neurogenic claudication: Secondary | ICD-10-CM | POA: Insufficient documentation

## 2022-11-15 DIAGNOSIS — M25562 Pain in left knee: Secondary | ICD-10-CM | POA: Insufficient documentation

## 2022-11-15 DIAGNOSIS — G8929 Other chronic pain: Secondary | ICD-10-CM | POA: Insufficient documentation

## 2022-11-15 DIAGNOSIS — Z5181 Encounter for therapeutic drug level monitoring: Secondary | ICD-10-CM | POA: Insufficient documentation

## 2022-11-15 DIAGNOSIS — M5416 Radiculopathy, lumbar region: Secondary | ICD-10-CM | POA: Insufficient documentation

## 2022-11-15 DIAGNOSIS — G894 Chronic pain syndrome: Secondary | ICD-10-CM | POA: Insufficient documentation

## 2022-11-15 MED ORDER — OXYCODONE-ACETAMINOPHEN 10-325 MG PO TABS: 1.0000 | ORAL_TABLET | Freq: Every day | ORAL | 0 refills | Status: DC | PRN

## 2022-11-15 NOTE — Progress Notes (Unsigned)
Subjective:    Patient ID: Anthony [name], male    DOB: 1980-06-18, 42 y.o.   MRN: [id number]  HPI: Anthony [name] is a 41 y.o. male who returns for follow up appointment for chronic pain and medication refill. states *** pain is located in  ***. rates pain ***. current exercise regime is walking and performing stretching exercises.  Anthony [name] Morphine equivalent is *** MME.   Last UDS was performed 06/25/2022, it ws consistent      Pain Inventory Average Pain 7 Pain Right Now 7 My pain is sharp, burning, dull, and stabbing  In the last 24 hours, has pain interfered with the following? General activity 10 Relation with others 10 Enjoyment of life 10 What TIME of day is your pain at its worst? morning , daytime, evening, and night Sleep (in general) Poor  Pain is worse with: walking, bending, sitting, and standing Pain improves with: rest and medication Relief from Meds: 4  Family History  Problem Relation Age of Onset   Hypertension Father    Diabetes Father    Stroke Father    Congestive Heart Failure Father    Lymphoma Paternal Uncle    Ovarian cancer Maternal Grandmother 45   Social History   Socioeconomic History   Marital status: Married    Spouse name: Not on file   Number of children: Not on file   Years of education: Not on file   Highest education level: Not on file  Occupational History   Occupation: Disabled  Tobacco Use   Smoking status: Former    Packs/day: 0.30    Years: 10.00    Additional pack years: 0.00    Total pack years: 3.00    Types: Cigarettes    Quit date: 09/30/2016    Years since quitting: 6.1    Passive exposure: Never   Smokeless tobacco: Never  Vaping Use   Vaping Use: Never used  Substance and Sexual Activity   Alcohol use: No   Drug use: No   Sexual activity: Yes    Birth control/protection: Condom  Other Topics Concern   Not on file  Social History Narrative   Not on file   Social Determinants of Health    Financial Resource Strain: Not on file  Food Insecurity: Not on file  Transportation Needs: Not on file  Physical Activity: Not on file  Stress: Not on file  Social Connections: Not on file   Past Surgical History:  Procedure Laterality Date   ARTHROSCOPY KNEE W/ DRILLING     right knee    ENDOBRONCHIAL ULTRASOUND Bilateral 03/04/2017   Procedure: ENDOBRONCHIAL ULTRASOUND;  Surgeon: Leslye Peer, MD;  Location: [facility];  Service: Cardiopulmonary;  Laterality: Bilateral;   left knee inner growth plate removed     to straighten leg   Past Surgical History:  Procedure Laterality Date   ARTHROSCOPY KNEE W/ DRILLING     right knee    ENDOBRONCHIAL ULTRASOUND Bilateral 03/04/2017   Procedure: ENDOBRONCHIAL ULTRASOUND;  Surgeon: Leslye Peer, MD;  [facility]: [facility];  Service: Cardiopulmonary;  Laterality: Bilateral;   left knee inner growth plate removed     to straighten leg   Past Medical History:  Diagnosis Date   Anxiety    Depression    Dyspnea    Eczema of hand    Headache    hx of with tooth pain   Obese    Pneumonia    12/2016   Pre-diabetes  Sarcoidosis    Sleep apnea    cpap   BP 98/63   Pulse 76   Ht 5\' 8"  (1.727 m)   Wt (!) 572 lb (259.5 kg)   SpO2 97%   BMI 86.97 kg/m   Opioid Risk Score:   Fall Risk Score:  `1  Depression screen [facility] 2/9     10/17/2022    9:55 AM 07/22/2022    9:53 AM 05/27/2022    9:09 AM 04/30/2022    2:35 PM 03/27/2022    9:06 AM 02/25/2022    8:43 AM 01/29/2022    8:38 AM  Depression screen PHQ 2/9  Decreased Interest 0 0 0 0 0 0 0  Down, Depressed, Hopeless 0 0 0 0 0 0 0  PHQ - 2 Score 0 0 0 0 0 0 0    Review of Systems  Musculoskeletal:  Positive for back pain.       Bilateral knee pain Right outer thigh pain  All other systems reviewed and are negative.      Objective:   Physical Exam        Assessment & Plan:  1.Right Lumbar Radiculitis/ Spinal Stenosis : Continue HEP as Tolerated and  Continue to Monitor. 10/17/2022 Refilled: :Oxycodone 10/325 mg one tablet 4 times a day as needed for pain #120. We will continue the opioid monitoring program, this consists of regular clinic visits, examinations, urine drug screen, pill counts as well as use of [place] Controlled Substance Reporting system. A 12 month History has been reviewed on the [place] Controlled Substance Reporting System on 10/17/2022.  2. Thoracic Back Pain: No complaints today.Continue HEP as Tolerated . Continue to Monitor. 10/17/2022 3. Right Shoulder Pain: No Complaints today. Continue HEP as Tolerated and Continue to Monitor.06/062024 4. Chronic Pain Syndrome: Continue HEP as Tolerated. Continue to Monitor. 10/17/2022 5. Morbid Obesity: Continue Healthy Diet Regimen. Continue to Monitor. 10/17/2022 6. Chronic Bilateral Knee Pain:Continue HEP as Tolerated. Continue to Monitor. 10/17/2022 7. Left Ankle Pain: No complaints today.  Ortho Following.    F/U in 1 Month

## 2022-11-29 ENCOUNTER — Other Ambulatory Visit: Payer: Self-pay | Admitting: Emergency Medicine

## 2022-12-13 ENCOUNTER — Encounter: Payer: Self-pay | Attending: Physical Medicine and Rehabilitation | Admitting: Registered Nurse

## 2022-12-13 ENCOUNTER — Encounter: Payer: Self-pay | Admitting: Registered Nurse

## 2022-12-13 VITALS — BP 115/76 | HR 61 | Ht 68.0 in | Wt >= 6400 oz

## 2022-12-13 DIAGNOSIS — G894 Chronic pain syndrome: Secondary | ICD-10-CM | POA: Insufficient documentation

## 2022-12-13 DIAGNOSIS — M48062 Spinal stenosis, lumbar region with neurogenic claudication: Secondary | ICD-10-CM | POA: Insufficient documentation

## 2022-12-13 DIAGNOSIS — M25562 Pain in left knee: Secondary | ICD-10-CM | POA: Insufficient documentation

## 2022-12-13 DIAGNOSIS — G8929 Other chronic pain: Secondary | ICD-10-CM | POA: Insufficient documentation

## 2022-12-13 DIAGNOSIS — M25561 Pain in right knee: Secondary | ICD-10-CM | POA: Insufficient documentation

## 2022-12-13 DIAGNOSIS — Z79891 Long term (current) use of opiate analgesic: Secondary | ICD-10-CM | POA: Insufficient documentation

## 2022-12-13 DIAGNOSIS — Z5181 Encounter for therapeutic drug level monitoring: Secondary | ICD-10-CM | POA: Insufficient documentation

## 2022-12-13 MED ORDER — OXYCODONE-ACETAMINOPHEN 10-325 MG PO TABS: 1.0000 | ORAL_TABLET | Freq: Every day | ORAL | 0 refills | Status: DC | PRN

## 2022-12-13 NOTE — Progress Notes (Signed)
Subjective:    Patient ID: Anthony [name], male    DOB: August 18, 1980, 42 y.o.   MRN: [id number]  HPI: Anthony [name] is a 42 y.o. male who returns for follow up appointment for chronic pain and medication refill. He states his pain is located in his lower back and bilateral knees. He reports his pain was controlled  with the increase frequency of Oxycodone, we will continue with current medication regimen.He rates his pain 8. His current exercise regime is walking and performing stretching exercises.  Anthony [name] Morphine equivalent is 75.00 MME.   UDS ordered today.     Pain Inventory Average Pain 8 Pain Right Now 8 My pain is constant, sharp, burning, dull, stabbing, and aching  In the last 24 hours, has pain interfered with the following? General activity 10 Relation with others 10 Enjoyment of life 10 What TIME of day is your pain at its worst? morning , daytime, evening, and night Sleep (in general) Poor  Pain is worse with: walking, bending, sitting, inactivity, and standing Pain improves with: rest and medication Relief from Meds: 5  Family History  Problem Relation Age of Onset   Hypertension Father    Diabetes Father    Stroke Father    Congestive Heart Failure Father    Lymphoma Paternal Uncle    Ovarian cancer Maternal Grandmother 45   Social History   Socioeconomic History   Marital status: Married    Spouse name: Not on file   Number of children: Not on file   Years of education: Not on file   Highest education level: Not on file  Occupational History   Occupation: Disabled  Tobacco Use   Smoking status: Former    Current packs/day: 0.00    Average packs/day: 0.3 packs/day for 10.0 years (3.0 ttl pk-yrs)    Types: Cigarettes    Start date: 10/01/2006    Quit date: 09/30/2016    Years since quitting: 6.2    Passive exposure: Never   Smokeless tobacco: Never  Vaping Use   Vaping status: Never Used  Substance and Sexual Activity   Alcohol use: No    Drug use: No   Sexual activity: Yes    Birth control/protection: Condom  Other Topics Concern   Not on file  Social History Narrative   Not on file   Social Determinants of Health   Financial Resource Strain: Not on file  Food Insecurity: Not on file  Transportation Needs: Not on file  Physical Activity: Not on file  Stress: Not on file  Social Connections: Not on file   Past Surgical History:  Procedure Laterality Date   ARTHROSCOPY KNEE W/ DRILLING     right knee    ENDOBRONCHIAL ULTRASOUND Bilateral 03/04/2017   Procedure: ENDOBRONCHIAL ULTRASOUND;  Surgeon: Leslye Peer, MD;  Location: [facility];  Service: Cardiopulmonary;  Laterality: Bilateral;   left knee inner growth plate removed     to straighten leg   Past Surgical History:  Procedure Laterality Date   ARTHROSCOPY KNEE W/ DRILLING     right knee    ENDOBRONCHIAL ULTRASOUND Bilateral 03/04/2017   Procedure: ENDOBRONCHIAL ULTRASOUND;  Surgeon: Leslye Peer, MD;  [facility]: [facility];  Service: Cardiopulmonary;  Laterality: Bilateral;   left knee inner growth plate removed     to straighten leg   Past Medical History:  Diagnosis Date   Anxiety    Depression    Dyspnea    Eczema of hand  Headache    hx of with tooth pain   Obese    Pneumonia    12/2016   Pre-diabetes    Sarcoidosis    Sleep apnea    cpap   There were no vitals taken for this visit.  Opioid Risk Score:   Fall Risk Score:  `1  Depression screen [facility] 2/9     11/15/2022    9:31 AM 10/17/2022    9:55 AM 07/22/2022    9:53 AM 05/27/2022    9:09 AM 04/30/2022    2:35 PM 03/27/2022    9:06 AM 02/25/2022    8:43 AM  Depression screen PHQ 2/9  Decreased Interest 1 0 0 0 0 0 0  Down, Depressed, Hopeless 0 0 0 0 0 0 0  PHQ - 2 Score 1 0 0 0 0 0 0     Review of Systems  Musculoskeletal:  Positive for back pain and neck pain.       Pain in both knees, and right hip & right leg  All other systems reviewed and are negative.      Objective:   Physical Exam Vitals and nursing note reviewed.  Constitutional:      Appearance: Normal appearance. He is obese.  Cardiovascular:     Rate and Rhythm: Normal rate and regular rhythm.     Pulses: Normal pulses.     Heart sounds: Normal heart sounds.  Pulmonary:     Effort: Pulmonary effort is normal.     Breath sounds: Normal breath sounds.  Musculoskeletal:     Cervical back: Normal range of motion and neck supple.     Comments: Normal Muscle Bulk and Muscle Testing Reveals:  Upper Extremities: Full ROM and Muscle Strength 5/5  Lumbar Paraspinal Tenderness: L-4-L-5 Lower Extremities: Decreased ROM and Muscle Strength 5/5 Bilateral Lower extremities Flexion Produces Pain into his Bilateral Patella's Arises from Table slowly Narrow Based  Gait     Skin:    General: Skin is warm and dry.  Neurological:     Mental Status: He is alert and oriented to person, place, and time.  Psychiatric:        Mood and Affect: Mood normal.        Behavior: Behavior normal.         Assessment & Plan:  1.Right Lumbar Radiculitis/ Spinal Stenosis : Continue HEP as Tolerated and Continue to Monitor. 12/13/2022 Refilled: :Increased : Oxycodone 10/325 mg one tablet 5 times a day as needed for pain #150. We will continue the opioid monitoring program, this consists of regular clinic visits, examinations, urine drug screen, pill counts as well as use of [place] Controlled Substance Reporting system. A 12 month History has been reviewed on the [place] Controlled Substance Reporting System on 12/13/2022.  2. Thoracic Back Pain: No complaints today.Continue HEP as Tolerated . Continue to Monitor. 12/13/2022 3. Right Shoulder Pain: No Complaints today. Continue HEP as Tolerated and Continue to Monitor.08/022024 4. Chronic Pain Syndrome: Continue HEP as Tolerated. Continue to Monitor. 12/13/2022 5. Morbid Obesity: Continue Healthy Diet Regimen. Continue to Monitor.  12/13/2022 6. Chronic Bilateral Knee Pain:Continue HEP as Tolerated. Continue to Monitor. 12/13/2022 7. Left Ankle Pain: No complaints today.  Ortho Following. 12/13/2022   F/U in 1 Month

## 2022-12-29 ENCOUNTER — Other Ambulatory Visit: Payer: Self-pay | Admitting: Physical Medicine and Rehabilitation

## 2023-01-10 ENCOUNTER — Ambulatory Visit: Payer: Self-pay | Admitting: Registered Nurse

## 2023-01-14 ENCOUNTER — Encounter: Payer: Self-pay | Admitting: Registered Nurse

## 2023-01-14 ENCOUNTER — Encounter: Payer: Self-pay | Attending: Physical Medicine and Rehabilitation | Admitting: Registered Nurse

## 2023-01-14 VITALS — BP 143/80 | HR 75 | Ht 68.0 in | Wt >= 6400 oz

## 2023-01-14 DIAGNOSIS — Z5181 Encounter for therapeutic drug level monitoring: Secondary | ICD-10-CM

## 2023-01-14 DIAGNOSIS — G894 Chronic pain syndrome: Secondary | ICD-10-CM

## 2023-01-14 DIAGNOSIS — M25561 Pain in right knee: Secondary | ICD-10-CM | POA: Insufficient documentation

## 2023-01-14 DIAGNOSIS — M25562 Pain in left knee: Secondary | ICD-10-CM | POA: Insufficient documentation

## 2023-01-14 DIAGNOSIS — G8929 Other chronic pain: Secondary | ICD-10-CM

## 2023-01-14 DIAGNOSIS — M48062 Spinal stenosis, lumbar region with neurogenic claudication: Secondary | ICD-10-CM

## 2023-01-14 DIAGNOSIS — Z79891 Long term (current) use of opiate analgesic: Secondary | ICD-10-CM

## 2023-01-14 MED ORDER — OXYCODONE-ACETAMINOPHEN 10-325 MG PO TABS: 1.0000 | ORAL_TABLET | Freq: Every day | ORAL | 0 refills | Status: DC | PRN

## 2023-01-14 NOTE — Progress Notes (Signed)
Subjective:    Patient ID: Anthony [name], male    DOB: [birth year], 42 y.o.   MRN: [id number]  HPI: Anthony [name] is a 42 y.o. male who returns for follow up appointment for chronic pain and medication refill. He states his  pain is located in his lower back and bilateral knee pain. He rates his pain 7. His current exercise regime is walking and performing stretching exercises.  Mr. Charon Morphine equivalent is 67.50 MME.   Last UDS was Performed on 12/13/2022, it was consistent.     Pain Inventory Average Pain 7 Pain Right Now 7 My pain is sharp, burning, dull, stabbing, and aching  In the last 24 hours, has pain interfered with the following? General activity 10 Relation with others 10 Enjoyment of life 10 What TIME of day is your pain at its worst? morning , daytime, evening, and night Sleep (in general) Poor  Pain is worse with: walking, bending, sitting, inactivity, and standing Pain improves with: rest and medication Relief from Meds: 5  Family History  Problem Relation Age of Onset   Hypertension Father    Diabetes Father    Stroke Father    Congestive Heart Failure Father    Lymphoma Paternal Uncle    Ovarian cancer Maternal Grandmother 2   Social History   Socioeconomic History   Marital status: Married    Spouse name: Not on file   Number of children: Not on file   Years of education: Not on file   Highest education level: Not on file  Occupational History   Occupation: Disabled  Tobacco Use   Smoking status: Former    Current packs/day: 0.00    Average packs/day: 0.3 packs/day for 10.0 years (3.0 ttl pk-yrs)    Types: Cigarettes    Start date: 10/01/2006    Quit date: 09/30/2016    Years since quitting: 6.2    Passive exposure: Never   Smokeless tobacco: Never  Vaping Use   Vaping status: Never Used  Substance and Sexual Activity   Alcohol use: No   Drug use: No   Sexual activity: Yes    Birth control/protection: Condom  Other Topics  Concern   Not on file  Social History Narrative   Not on file   Social Determinants of Health   Financial Resource Strain: Not on file  Food Insecurity: Not on file  Transportation Needs: Not on file  Physical Activity: Not on file  Stress: Not on file  Social Connections: Not on file   Past Surgical History:  Procedure Laterality Date   ARTHROSCOPY KNEE W/ DRILLING     right knee    ENDOBRONCHIAL ULTRASOUND Bilateral 03/04/2017   Procedure: ENDOBRONCHIAL ULTRASOUND;  Surgeon: Leslye Peer, MD;  [facility];  Service: Cardiopulmonary;  Laterality: Bilateral;   left knee inner growth plate removed     to straighten leg   Past Surgical History:  Procedure Laterality Date   ARTHROSCOPY KNEE W/ DRILLING     right knee    ENDOBRONCHIAL ULTRASOUND Bilateral 03/04/2017   Procedure: ENDOBRONCHIAL ULTRASOUND;  Surgeon: Leslye Peer, MD;  [facility];  Service: Cardiopulmonary;  Laterality: Bilateral;   left knee inner growth plate removed     to straighten leg   Past Medical History:  Diagnosis Date   Anxiety    Depression    Dyspnea    Eczema of hand    Headache    hx of with tooth pain  Obese    Pneumonia    12/2016   Pre-diabetes    Sarcoidosis    Sleep apnea    cpap   BP (!) 155/84   Pulse 75   Ht 5\' 8"  (1.727 m)   Wt (!) 566 lb (256.7 kg)   SpO2 93%   BMI 86.06 kg/m   Opioid Risk Score:   Fall Risk Score:  `1  Depression screen [facility] 2/9     12/13/2022    9:40 AM 11/15/2022    9:31 AM 10/17/2022    9:55 AM 07/22/2022    9:53 AM 05/27/2022    9:09 AM 04/30/2022    2:35 PM 03/27/2022    9:06 AM  Depression screen PHQ 2/9  Decreased Interest 0 1 0 0 0 0 0  Down, Depressed, Hopeless 0 0 0 0 0 0 0  PHQ - 2 Score 0 1 0 0 0 0 0     Review of Systems  Musculoskeletal:  Positive for back pain.       Bilateral knee pain Right leg pain  All other systems reviewed and are negative.     Objective:   Physical Exam Vitals and nursing  note reviewed.  Constitutional:      Appearance: Normal appearance. He is obese.  Cardiovascular:     Rate and Rhythm: Normal rate and regular rhythm.     Pulses: Normal pulses.     Heart sounds: Normal heart sounds.  Pulmonary:     Effort: Pulmonary effort is normal.     Breath sounds: Normal breath sounds.  Musculoskeletal:     Cervical back: Normal range of motion and neck supple.     Comments: Normal Muscle Bulk and Muscle Testing Reveals:  Upper Extremities: Full ROM and Muscle Strength 5/5 Lumbar Paraspinal Tenderness: L-4-L-5 Lower Extremities: Full ROM and Muscle Strength 5.5 Arises from Table slowly Narrow Based  Gait     Skin:    General: Skin is warm and dry.  Neurological:     Mental Status: He is alert and oriented to person, place, and time.  Psychiatric:        Mood and Affect: Mood normal.        Behavior: Behavior normal.         Assessment & Plan:  1.Right Lumbar Radiculitis/ Spinal Stenosis : Continue HEP as Tolerated and Continue to Monitor. 01/14/2023 Refilled: : Oxycodone 10/325 mg one tablet 5 times a day as needed for pain #150. We will continue the opioid monitoring program, this consists of regular clinic visits, examinations, urine drug screen, pill counts as well as use of [place] Controlled Substance Reporting system. A 12 month History has been reviewed on the [place] Controlled Substance Reporting System on 01/14/2023.  2. Thoracic Back Pain: No complaints today.Continue HEP as Tolerated . Continue to Monitor. 01/14/2023 3. Right Shoulder Pain: No Complaints today. Continue HEP as Tolerated and Continue to Monitor.09/032024 4. Chronic Pain Syndrome: Continue HEP as Tolerated. Continue to Monitor. 01/14/2023 5. Morbid Obesity: Continue Healthy Diet Regimen. Continue to Monitor. 01/14/2023 6. Chronic Bilateral Knee Pain:Continue HEP as Tolerated. Continue to Monitor. 01/14/2023 7. Left Ankle Pain: No complaints today.  Ortho Following.  01/14/2023   F/U in 1 Month

## 2023-01-16 ENCOUNTER — Other Ambulatory Visit: Payer: Self-pay | Admitting: Emergency Medicine

## 2023-01-28 ENCOUNTER — Other Ambulatory Visit: Payer: Self-pay | Admitting: Emergency Medicine

## 2023-02-11 ENCOUNTER — Telehealth: Payer: Self-pay | Admitting: Registered Nurse

## 2023-02-11 ENCOUNTER — Encounter: Payer: Self-pay | Admitting: Registered Nurse

## 2023-02-11 ENCOUNTER — Encounter: Payer: Self-pay | Attending: Physical Medicine and Rehabilitation | Admitting: Registered Nurse

## 2023-02-11 VITALS — BP 134/75 | HR 99 | Ht 68.0 in | Wt >= 6400 oz

## 2023-02-11 DIAGNOSIS — M546 Pain in thoracic spine: Secondary | ICD-10-CM | POA: Insufficient documentation

## 2023-02-11 DIAGNOSIS — M25561 Pain in right knee: Secondary | ICD-10-CM | POA: Insufficient documentation

## 2023-02-11 DIAGNOSIS — G8929 Other chronic pain: Secondary | ICD-10-CM | POA: Insufficient documentation

## 2023-02-11 DIAGNOSIS — G894 Chronic pain syndrome: Secondary | ICD-10-CM | POA: Insufficient documentation

## 2023-02-11 DIAGNOSIS — M25562 Pain in left knee: Secondary | ICD-10-CM | POA: Insufficient documentation

## 2023-02-11 DIAGNOSIS — Z5181 Encounter for therapeutic drug level monitoring: Secondary | ICD-10-CM | POA: Insufficient documentation

## 2023-02-11 DIAGNOSIS — Z79891 Long term (current) use of opiate analgesic: Secondary | ICD-10-CM | POA: Insufficient documentation

## 2023-02-11 DIAGNOSIS — M48062 Spinal stenosis, lumbar region with neurogenic claudication: Secondary | ICD-10-CM | POA: Insufficient documentation

## 2023-02-11 MED ORDER — OXYCODONE-ACETAMINOPHEN 10-325 MG PO TABS: 1.0000 | ORAL_TABLET | Freq: Every day | ORAL | 0 refills | Status: DC | PRN

## 2023-02-11 NOTE — Telephone Encounter (Signed)
Prescription that was sent to [facility] does not have the medication. [facility] to [facility] [address].

## 2023-02-11 NOTE — Telephone Encounter (Signed)
[facility]codone e-scribed to [facility] [address] [address] out of stock of Oxycodone. Call placed to Mr. Hartnett regarding the above, he verbalizes understanding.

## 2023-02-11 NOTE — Progress Notes (Signed)
Subjective:    Patient ID: Anthony [name], male    DOB: 09/06/1980, 42 y.o.   MRN: [id number]  HPI: Anthony [name] is a 42 y.o. male who returns for follow up appointment for chronic pain and medication refill. He states his pain is located in his upper- lower back and bilateral knee pain. He rates his pain 8. His current exercise regime is walking and performing stretching exercises.  Anthony [name] equivalent is 75.00 MME.   Last UDS was Performed on 12/13/2022   Pain Inventory Average Pain 7 Pain Right Now 8 My pain is sharp, burning, dull, stabbing, and aching  In the last 24 hours, has pain interfered with the following? General activity 10 Relation with others 10 Enjoyment of life 10 What TIME of day is your pain at its worst? morning , daytime, evening, and night Sleep (in general) Fair  Pain is worse with: walking, bending, sitting, inactivity, and standing Pain improves with: rest and medication Relief from Meds: 5  Family History  Problem Relation Age of Onset   Hypertension Father    Diabetes Father    Stroke Father    Congestive Heart Failure Father    Lymphoma Paternal Uncle    Ovarian cancer Maternal Grandmother 47   Social History   Socioeconomic History   Marital status: Married    Spouse name: Not on file   Number of children: Not on file   Years of education: Not on file   Highest education level: Not on file  Occupational History   Occupation: Disabled  Tobacco Use   Smoking status: Former    Current packs/day: 0.00    Average packs/day: 0.3 packs/day for 10.0 years (3.0 ttl pk-yrs)    Types: Cigarettes    Start date: 10/01/2006    Quit date: 09/30/2016    Years since quitting: 6.3    Passive exposure: Never   Smokeless tobacco: Never  Vaping Use   Vaping status: Never Used  Substance and Sexual Activity   Alcohol use: No   Drug use: No   Sexual activity: Yes    Birth control/protection: Condom  Other Topics Concern   Not on  file  Social History Narrative   Not on file   Social Determinants of Health   Financial Resource Strain: Not on file  Food Insecurity: Not on file  Transportation Needs: Not on file  Physical Activity: Not on file  Stress: Not on file  Social Connections: Not on file   Past Surgical History:  Procedure Laterality Date   ARTHROSCOPY KNEE W/ DRILLING     right knee    ENDOBRONCHIAL ULTRASOUND Bilateral 03/04/2017   Procedure: ENDOBRONCHIAL ULTRASOUND;  Surgeon: Leslye Peer, MD;  Location: [facility];  Service: Cardiopulmonary;  Laterality: Bilateral;   left knee inner growth plate removed     to straighten leg   Past Surgical History:  Procedure Laterality Date   ARTHROSCOPY KNEE W/ DRILLING     right knee    ENDOBRONCHIAL ULTRASOUND Bilateral 03/04/2017   Procedure: ENDOBRONCHIAL ULTRASOUND;  Surgeon: Leslye Peer, MD;  Location: [facility];  Service: Cardiopulmonary;  Laterality: Bilateral;   left knee inner growth plate removed     to straighten leg   Past Medical History:  Diagnosis Date   Anxiety    Depression    Dyspnea    Eczema of hand    Headache    hx of with tooth pain   Obese  Pneumonia    12/2016   Pre-diabetes    Sarcoidosis    Sleep apnea    cpap   There were no vitals taken for this visit.  Opioid Risk Score:   Fall Risk Score:  `1  Depression screen [facility] 2/9     12/13/2022    9:40 AM 11/15/2022    9:31 AM 10/17/2022    9:55 AM 07/22/2022    9:53 AM 05/27/2022    9:09 AM 04/30/2022    2:35 PM 03/27/2022    9:06 AM  Depression screen PHQ 2/9  Decreased Interest 0 1 0 0 0 0 0  Down, Depressed, Hopeless 0 0 0 0 0 0 0  PHQ - 2 Score 0 1 0 0 0 0 0      Review of Systems  Musculoskeletal:  Positive for back pain and gait problem.       B/L knee pain   All other systems reviewed and are negative.     Objective:   Physical Exam Vitals and nursing note reviewed.  Constitutional:      Appearance: Normal appearance. He is obese.   Cardiovascular:     Rate and Rhythm: Normal rate and regular rhythm.     Pulses: Normal pulses.     Heart sounds: Normal heart sounds.  Pulmonary:     Effort: Pulmonary effort is normal.     Breath sounds: Normal breath sounds.  Musculoskeletal:     Cervical back: Normal range of motion and neck supple.     Comments: Normal Muscle Bulk and Muscle Testing Reveals:  Upper Extremities: Full ROM and Muscle Strength 5/5 Thoracic Paraspinal Tenderness: T-2- T-4 Lumbar Paraspinal Tenderness: L-4-L-5 Lower Extremities: Decreased ROM and Muscle Strength 5/5 Bilateral Lower Extremities Flexion Produces Pain into his bilateral Lower Extremities  Arises from Table Slowly Antalgic  Gait     Skin:    General: Skin is warm and dry.  Neurological:     Mental Status: He is alert and oriented to person, place, and time.  Psychiatric:        Mood and Affect: Mood normal.        Behavior: Behavior normal.          Assessment & Plan:  1.Right Lumbar Radiculitis/ Spinal Stenosis : Continue HEP as Tolerated and Continue to Monitor. 02/11/2023 Refilled: : Oxycodone 10/325 mg one tablet 5 times a day as needed for pain #150. We will continue the opioid monitoring program, this consists of regular clinic visits, examinations, urine drug screen, pill counts as well as use of [place] Controlled Substance Reporting system. A 12 month History has been reviewed on the [place] Controlled Substance Reporting System on 02/11/2023.  2. Thoracic Back Pain: .Continue HEP as Tolerated . Continue to Monitor. 02/11/2023 3. Right Shoulder Pain: No Complaints today. Continue HEP as Tolerated and Continue to Monitor.10/012024 4. Chronic Pain Syndrome: Continue HEP as Tolerated. Continue to Monitor. 02/11/2023 5. Morbid Obesity: Continue Healthy Diet Regimen. Continue to Monitor. 02/11/2023 6. Chronic Bilateral Knee Pain:Continue HEP as Tolerated. Continue to Monitor. 02/11/2023 7. Left Ankle Pain: No  complaints today.  Ortho Following. 02/11/2023   F/U in 1 Month

## 2023-02-15 ENCOUNTER — Other Ambulatory Visit: Payer: Self-pay | Admitting: Emergency Medicine

## 2023-03-12 NOTE — Progress Notes (Deleted)
Subjective:    Patient ID: Anthony [name], male    DOB: [birth year], 42 y.o.   MRN: [id number]  HPI   Pain Inventory Average Pain {NUMBERS; 0-10:5044} Pain Right Now {NUMBERS; 0-10:5044} My pain is {PAIN DESCRIPTION:21022940}  In the last 24 hours, has pain interfered with the following? General activity {NUMBERS; 0-10:5044} Relation with others {NUMBERS; 0-10:5044} Enjoyment of life {NUMBERS; 0-10:5044} What TIME of day is your pain at its worst? {time of day:24191} Sleep (in general) {BHH GOOD/FAIR/POOR:22877}  Pain is worse with: {ACTIVITIES:21022942} Pain improves with: {PAIN IMPROVES GEXB:28413244} Relief from Meds: {NUMBERS; 0-10:5044}  Family History  Problem Relation Age of Onset   Hypertension Father    Diabetes Father    Stroke Father    Congestive Heart Failure Father    Lymphoma Paternal Uncle    Ovarian cancer Maternal Grandmother 87   Social History   Socioeconomic History   Marital status: Married    Spouse name: Not on file   Number of children: Not on file   Years of education: Not on file   Highest education level: Not on file  Occupational History   Occupation: Disabled  Tobacco Use   Smoking status: Former    Current packs/day: 0.00    Average packs/day: 0.3 packs/day for 10.0 years (3.0 ttl pk-yrs)    Types: Cigarettes    Start date: 10/01/2006    Quit date: 09/30/2016    Years since quitting: 6.4    Passive exposure: Never   Smokeless tobacco: Never  Vaping Use   Vaping status: Never Used  Substance and Sexual Activity   Alcohol use: No   Drug use: No   Sexual activity: Yes    Birth control/protection: Condom  Other Topics Concern   Not on file  Social History Narrative   Not on file   Social Determinants of Health   Financial Resource Strain: Not on file  Food Insecurity: Not on file  Transportation Needs: Not on file  Physical Activity: Not on file  Stress: Not on file  Social Connections: Not on file   Past Surgical  History:  Procedure Laterality Date   ARTHROSCOPY KNEE W/ DRILLING     right knee    ENDOBRONCHIAL ULTRASOUND Bilateral 03/04/2017   Procedure: ENDOBRONCHIAL ULTRASOUND;  Surgeon: Leslye Peer, MD;  Location: [facility];  Service: Cardiopulmonary;  Laterality: Bilateral;   left knee inner growth plate removed     to straighten leg   Past Surgical History:  Procedure Laterality Date   ARTHROSCOPY KNEE W/ DRILLING     right knee    ENDOBRONCHIAL ULTRASOUND Bilateral 03/04/2017   Procedure: ENDOBRONCHIAL ULTRASOUND;  Surgeon: Leslye Peer, MD;  Location: [facility];  Service: Cardiopulmonary;  Laterality: Bilateral;   left knee inner growth plate removed     to straighten leg   Past Medical History:  Diagnosis Date   Anxiety    Depression    Dyspnea    Eczema of hand    Headache    hx of with tooth pain   Obese    Pneumonia    12/2016   Pre-diabetes    Sarcoidosis    Sleep apnea    cpap   There were no vitals taken for this visit.  Opioid Risk Score:   Fall Risk Score:  `1  Depression screen [facility] 2/9     02/11/2023    9:48 AM 12/13/2022    9:40 AM 11/15/2022    9:31 AM 10/17/2022  9:55 AM 07/22/2022    9:53 AM 05/27/2022    9:09 AM 04/30/2022    2:35 PM  Depression screen PHQ 2/9  Decreased Interest 0 0 1 0 0 0 0  Down, Depressed, Hopeless 0 0 0 0 0 0 0  PHQ - 2 Score 0 0 1 0 0 0 0    Review of Systems     Objective:   Physical Exam        Assessment & Plan:

## 2023-03-14 ENCOUNTER — Encounter: Payer: Self-pay | Attending: Physical Medicine and Rehabilitation | Admitting: Registered Nurse

## 2023-03-14 DIAGNOSIS — M48062 Spinal stenosis, lumbar region with neurogenic claudication: Secondary | ICD-10-CM | POA: Insufficient documentation

## 2023-03-14 DIAGNOSIS — M546 Pain in thoracic spine: Secondary | ICD-10-CM | POA: Insufficient documentation

## 2023-03-14 DIAGNOSIS — M25562 Pain in left knee: Secondary | ICD-10-CM | POA: Insufficient documentation

## 2023-03-14 DIAGNOSIS — Z79891 Long term (current) use of opiate analgesic: Secondary | ICD-10-CM | POA: Insufficient documentation

## 2023-03-14 DIAGNOSIS — G894 Chronic pain syndrome: Secondary | ICD-10-CM | POA: Insufficient documentation

## 2023-03-14 DIAGNOSIS — M25561 Pain in right knee: Secondary | ICD-10-CM | POA: Insufficient documentation

## 2023-03-14 DIAGNOSIS — Z5181 Encounter for therapeutic drug level monitoring: Secondary | ICD-10-CM | POA: Insufficient documentation

## 2023-03-14 DIAGNOSIS — G8929 Other chronic pain: Secondary | ICD-10-CM | POA: Insufficient documentation

## 2023-03-29 ENCOUNTER — Other Ambulatory Visit: Payer: Self-pay | Admitting: Emergency Medicine

## 2023-03-29 ENCOUNTER — Other Ambulatory Visit: Payer: Self-pay | Admitting: Physical Medicine and Rehabilitation

## 2023-04-09 ENCOUNTER — Ambulatory Visit: Payer: Self-pay | Admitting: Primary Care

## 2023-04-15 ENCOUNTER — Other Ambulatory Visit: Payer: Self-pay | Admitting: Emergency Medicine

## 2023-04-18 ENCOUNTER — Telehealth: Payer: Self-pay | Admitting: Registered Nurse

## 2023-04-18 MED ORDER — DICLOFENAC SODIUM 75 MG PO TBEC: 75.0000 mg | DELAYED_RELEASE_TABLET | Freq: Two times a day (BID) | ORAL | 2 refills | Status: DC

## 2023-04-18 MED ORDER — OXYCODONE-ACETAMINOPHEN 10-325 MG PO TABS: 1.0000 | ORAL_TABLET | Freq: Every day | ORAL | 0 refills | Status: DC | PRN

## 2023-04-18 NOTE — Telephone Encounter (Signed)
Filled  Written  ID  Drug  QTY  Days  Prescriber  RX #  Dispenser  Refill  Daily Dose*  Pymt Type  PMP  02/11/2023 02/11/2023 1  Oxycodone-Acetaminophen 10-325 150.00 30 Eu Tho [id number] Nor (5429) 0/0 75.00 MME Private Pay [place]

## 2023-04-18 NOTE — Telephone Encounter (Signed)
Call placed to Anthony [name], he  had financial hardship and will be scheduled for an appointment on 05/13/2023. He will be starting a new job on Monday 04/21/2023.  PMP was Reviewed. UDS was Reviewed.  Oxycodone e-scribed to pharmacy and he has a scheduled appointment on 05/12/2023, he verbalizes understanding.

## 2023-04-18 NOTE — Telephone Encounter (Signed)
Anthony [name] last appointment was in October, he has been call to schedule an appointment.  No medication can be prescribed without an appointment.

## 2023-04-18 NOTE — Telephone Encounter (Signed)
Patient called in requesting medication refill on dicolfenac and oxycodone , patient is starting a new job on Monday and will call us back to reschedule an appointment once he has his schedule

## 2023-05-12 ENCOUNTER — Encounter: Payer: Self-pay | Admitting: Registered Nurse

## 2023-05-12 ENCOUNTER — Encounter: Payer: Self-pay | Attending: Physical Medicine and Rehabilitation | Admitting: Registered Nurse

## 2023-05-12 VITALS — BP 114/68 | HR 71 | Ht 68.0 in | Wt >= 6400 oz

## 2023-05-12 DIAGNOSIS — M25562 Pain in left knee: Secondary | ICD-10-CM | POA: Insufficient documentation

## 2023-05-12 DIAGNOSIS — G8929 Other chronic pain: Secondary | ICD-10-CM

## 2023-05-12 DIAGNOSIS — Z5181 Encounter for therapeutic drug level monitoring: Secondary | ICD-10-CM

## 2023-05-12 DIAGNOSIS — M5416 Radiculopathy, lumbar region: Secondary | ICD-10-CM

## 2023-05-12 DIAGNOSIS — M25561 Pain in right knee: Secondary | ICD-10-CM | POA: Insufficient documentation

## 2023-05-12 DIAGNOSIS — G894 Chronic pain syndrome: Secondary | ICD-10-CM

## 2023-05-12 DIAGNOSIS — M48062 Spinal stenosis, lumbar region with neurogenic claudication: Secondary | ICD-10-CM

## 2023-05-12 DIAGNOSIS — Z79891 Long term (current) use of opiate analgesic: Secondary | ICD-10-CM

## 2023-05-12 MED ORDER — OXYCODONE-ACETAMINOPHEN 10-325 MG PO TABS: 1.0000 | ORAL_TABLET | Freq: Every day | ORAL | 0 refills | Status: DC | PRN

## 2023-05-12 NOTE — Progress Notes (Signed)
Subjective:    Patient ID: Anthony [name], male    DOB: [birth year], 42 y.o.   MRN: [id number]  HPI: Anthony [name] is a 42 y.o. male who returns for follow up appointment for chronic pain and medication refill. He states his pain is located in his lower back radiating into his right lower extremity and bilateral knee pain. He rates his pain 8. His current exercise regime is walking and performing stretching exercises.  Morphine equivalent is 75.00 MME.   Last UDS was Performed on 12/13/2022, it was consistent.    Pain Inventory Average Pain 8 Pain Right Now 8 My pain is sharp, burning, dull, stabbing, and aching  In the last 24 hours, has pain interfered with the following? General activity 10 Relation with others 10 Enjoyment of life 10 What TIME of day is your pain at its worst? morning , daytime, evening, and night Sleep (in general) Poor  Pain is worse with: walking, bending, sitting, inactivity, and standing Pain improves with: rest and medication Relief from Meds: 5  Family History  Problem Relation Age of Onset   Hypertension Father    Diabetes Father    Stroke Father    Congestive Heart Failure Father    Lymphoma Paternal Uncle    Ovarian cancer Maternal Grandmother 24   Social History   Socioeconomic History   Marital status: Married    Spouse name: Not on file   Number of children: Not on file   Years of education: Not on file   Highest education level: Not on file  Occupational History   Occupation: Disabled  Tobacco Use   Smoking status: Former    Current packs/day: 0.00    Average packs/day: 0.3 packs/day for 10.0 years (3.0 ttl pk-yrs)    Types: Cigarettes    Start date: 10/01/2006    Quit date: 09/30/2016    Years since quitting: 6.6    Passive exposure: Never   Smokeless tobacco: Never  Vaping Use   Vaping status: Never Used  Substance and Sexual Activity   Alcohol use: No   Drug use: No   Sexual activity: Yes    Birth control/protection:  Condom  Other Topics Concern   Not on file  Social History Narrative   Not on file   Social Drivers of Health   Financial Resource Strain: Not on file  Food Insecurity: Not on file  Transportation Needs: Not on file  Physical Activity: Not on file  Stress: Not on file  Social Connections: Not on file   Past Surgical History:  Procedure Laterality Date   ARTHROSCOPY KNEE W/ DRILLING     right knee    ENDOBRONCHIAL ULTRASOUND Bilateral 03/04/2017   Procedure: ENDOBRONCHIAL ULTRASOUND;  Surgeon: Leslye Peer, MD;  Location: [facility];  Service: Cardiopulmonary;  Laterality: Bilateral;   left knee inner growth plate removed     to straighten leg   Past Surgical History:  Procedure Laterality Date   ARTHROSCOPY KNEE W/ DRILLING     right knee    ENDOBRONCHIAL ULTRASOUND Bilateral 03/04/2017   Procedure: ENDOBRONCHIAL ULTRASOUND;  Surgeon: Leslye Peer, MD;  Location: [facility];  Service: Cardiopulmonary;  Laterality: Bilateral;   left knee inner growth plate removed     to straighten leg   Past Medical History:  Diagnosis Date   Anxiety    Depression    Dyspnea    Eczema of hand    Headache    hx of with tooth  pain   Obese    Pneumonia    12/2016   Pre-diabetes    Sarcoidosis    Sleep apnea    cpap   BP 114/68   Pulse 71   Ht 5\' 8"  (1.727 m)   Wt (!) 569 lb (258.1 kg)   SpO2 96%   BMI 86.52 kg/m   Opioid Risk Score:   Fall Risk Score:  `1  Depression screen [facility] 2/9     05/12/2023    8:26 AM 02/11/2023    9:48 AM 12/13/2022    9:40 AM 11/15/2022    9:31 AM 10/17/2022    9:55 AM 07/22/2022    9:53 AM 05/27/2022    9:09 AM  Depression screen PHQ 2/9  Decreased Interest 0 0 0 1 0 0 0  Down, Depressed, Hopeless 0 0 0 0 0 0 0  PHQ - 2 Score 0 0 0 1 0 0 0      Review of Systems  Musculoskeletal:  Positive for back pain, gait problem and neck pain.  All other systems reviewed and are negative.     Objective:   Physical Exam Vitals and nursing  note reviewed.  Constitutional:      Appearance: Normal appearance.  Cardiovascular:     Rate and Rhythm: Normal rate and regular rhythm.     Pulses: Normal pulses.     Heart sounds: Normal heart sounds.  Pulmonary:     Effort: Pulmonary effort is normal.     Breath sounds: Normal breath sounds.  Musculoskeletal:     Comments: Normal Muscle Bulk and Muscle Testing Reveals:  Upper Extremities: Full ROM and Muscle Strength  5/5 Lumbar Paraspinal Tenderness: L-4-L-5 Lower Extremities: Decreased ROM and Muscle Strength 5/5 Bilateral Lower Extremities Flexion Produces Pain into his Bilateral Knees  Arises from Table slowly Antalgic  Gait     Skin:    General: Skin is warm and dry.  Neurological:     Mental Status: He is alert and oriented to person, place, and time.  Psychiatric:        Mood and Affect: Mood normal.        Behavior: Behavior normal.         Assessment & Plan:  1.Right Lumbar Radiculitis/ Spinal Stenosis : Continue HEP as Tolerated and Continue to Monitor. 05/12/2023 Refilled: : Oxycodone 10/325 mg one tablet 5 times a day as needed for pain #150. Second script sent for the following month. We will continue the opioid monitoring program, this consists of regular clinic visits, examinations, urine drug screen, pill counts as well as use of [place] Controlled Substance Reporting system. A 12 month History has been reviewed on the [place] Controlled Substance Reporting System on 05/12/2023.  2. Thoracic Back Pain: .Continue HEP as Tolerated . Continue to Monitor. 05/12/2023 3. Right Shoulder Pain: No Complaints today. Continue HEP as Tolerated and Continue to Monitor.12/302024 4. Chronic Pain Syndrome: Continue HEP as Tolerated. Continue to Monitor. 05/12/2023 5. Morbid Obesity: Continue Healthy Diet Regimen. Continue to Monitor. 05/12/2023 6. Chronic Bilateral Knee Pain:Continue HEP as Tolerated. Continue to Monitor. 05/12/2023 7. Left Ankle Pain: No  complaints today.  Ortho Following. 05/12/2023   F/U in 2 Months  [name] started a new job, he was given permission to F/U every 2 Months, he verbalizes understanding.

## 2023-05-28 ENCOUNTER — Other Ambulatory Visit: Payer: Self-pay | Admitting: Emergency Medicine

## 2023-06-13 ENCOUNTER — Telehealth: Payer: Self-pay

## 2023-06-13 NOTE — Telephone Encounter (Signed)
Anthony [name] is requesting a refill of Oxycodone 10-325 MG. Please send to [facility] on [address].

## 2023-06-13 NOTE — Telephone Encounter (Signed)
Patient has been informed Rx was sent

## 2023-07-11 ENCOUNTER — Encounter
Payer: No Typology Code available for payment source | Attending: Physical Medicine and Rehabilitation | Admitting: Registered Nurse

## 2023-07-11 ENCOUNTER — Encounter: Payer: Self-pay | Admitting: Registered Nurse

## 2023-07-11 VITALS — BP 120/77 | HR 61 | Ht 68.0 in | Wt >= 6400 oz

## 2023-07-11 DIAGNOSIS — Z79891 Long term (current) use of opiate analgesic: Secondary | ICD-10-CM | POA: Diagnosis present

## 2023-07-11 DIAGNOSIS — G894 Chronic pain syndrome: Secondary | ICD-10-CM | POA: Insufficient documentation

## 2023-07-11 DIAGNOSIS — Z5181 Encounter for therapeutic drug level monitoring: Secondary | ICD-10-CM | POA: Insufficient documentation

## 2023-07-11 MED ORDER — OXYCODONE-ACETAMINOPHEN 10-325 MG PO TABS: 1.0000 | ORAL_TABLET | Freq: Every day | ORAL | 0 refills | Status: DC | PRN

## 2023-07-11 MED ORDER — OXYCODONE-ACETAMINOPHEN 10-325 MG PO TABS
1.0000 | ORAL_TABLET | Freq: Every day | ORAL | 0 refills | Status: DC | PRN
Start: 2023-07-11 — End: 2023-07-11

## 2023-07-11 NOTE — Progress Notes (Deleted)
 Subjective:    Patient ID: Anthony [name], male    DOB: 1980-06-02, 42 y.o.   MRN: [id number]  HPI Pain Inventory Average Pain {NUMBERS; 0-10:5044} Pain Right Now {NUMBERS; 0-10:5044} My pain is {PAIN DESCRIPTION:21022940}  In the last 24 hours, has pain interfered with the following? General activity {NUMBERS; 0-10:5044} Relation with others {NUMBERS; 0-10:5044} Enjoyment of life {NUMBERS; 0-10:5044} What TIME of day is your pain at its worst? {time of day:24191} Sleep (in general) {BHH GOOD/FAIR/POOR:22877}  Pain is worse with: {ACTIVITIES:21022942} Pain improves with: {PAIN IMPROVES NWGN:56213086} Relief from Meds: {NUMBERS; 0-10:5044}  Family History  Problem Relation Age of Onset   Hypertension Father    Diabetes Father    Stroke Father    Congestive Heart Failure Father    Lymphoma Paternal Uncle    Ovarian cancer Maternal Grandmother 5   Social History   Socioeconomic History   Marital status: Married    Spouse name: Not on file   Number of children: Not on file   Years of education: Not on file   Highest education level: Not on file  Occupational History   Occupation: Disabled  Tobacco Use   Smoking status: Former    Current packs/day: 0.00    Average packs/day: 0.3 packs/day for 10.0 years (3.0 ttl pk-yrs)    Types: Cigarettes    Start date: 10/01/2006    Quit date: 09/30/2016    Years since quitting: 6.7    Passive exposure: Never   Smokeless tobacco: Never  Vaping Use   Vaping status: Never Used  Substance and Sexual Activity   Alcohol use: No   Drug use: No   Sexual activity: Yes    Birth control/protection: Condom  Other Topics Concern   Not on file  Social History Narrative   Not on file   Social Drivers of Health   Financial Resource Strain: Not on file  Food Insecurity: Not on file  Transportation Needs: Not on file  Physical Activity: Not on file  Stress: Not on file  Social Connections: Not on file   Past Surgical History:   Procedure Laterality Date   ARTHROSCOPY KNEE W/ DRILLING     right knee    ENDOBRONCHIAL ULTRASOUND Bilateral 03/04/2017   Procedure: ENDOBRONCHIAL ULTRASOUND;  Surgeon: Leslye Peer, MD;  Location: [facility];  Service: Cardiopulmonary;  Laterality: Bilateral;   left knee inner growth plate removed     to straighten leg   Past Surgical History:  Procedure Laterality Date   ARTHROSCOPY KNEE W/ DRILLING     right knee    ENDOBRONCHIAL ULTRASOUND Bilateral 03/04/2017   Procedure: ENDOBRONCHIAL ULTRASOUND;  Surgeon: Leslye Peer, MD;  Location: [facility];  Service: Cardiopulmonary;  Laterality: Bilateral;   left knee inner growth plate removed     to straighten leg   Past Medical History:  Diagnosis Date   Anxiety    Depression    Dyspnea    Eczema of hand    Headache    hx of with tooth pain   Obese    Pneumonia    12/2016   Pre-diabetes    Sarcoidosis    Sleep apnea    cpap   There were no vitals taken for this visit.  Opioid Risk Score:   Fall Risk Score:  `1  Depression screen [facility] 2/9     05/12/2023    8:26 AM 02/11/2023    9:48 AM 12/13/2022    9:40 AM 11/15/2022  9:31 AM 10/17/2022    9:55 AM 07/22/2022    9:53 AM 05/27/2022    9:09 AM  Depression screen PHQ 2/9  Decreased Interest 0 0 0 1 0 0 0  Down, Depressed, Hopeless 0 0 0 0 0 0 0  PHQ - 2 Score 0 0 0 1 0 0 0     Review of Systems     Objective:   Physical Exam        Assessment & Plan:

## 2023-07-11 NOTE — Progress Notes (Signed)
 Subjective:    Patient ID: Anthony [name], male    DOB: 1980/10/09, 43 y.o.   MRN: [id number]  HPI: Anthony [name] is a 43 y.o. male who returns for follow up appointment for chronic pain and medication refill. He states his pain is located in his upper- lower back and bilateral knee pain. He rates his pain 6. His current exercise regime is walking and performing stretching exercises.  [name] Morphine equivalent is 75.00  MME.   UDS ordered today.      Pain Inventory Average Pain 7 Pain Right Now 6 My pain is intermittent, constant, sharp, burning, dull, stabbing, and aching  In the last 24 hours, has pain interfered with the following? General activity 10 Relation with others 10 Enjoyment of life 10 What TIME of day is your pain at its worst? morning , daytime, evening, and night Sleep (in general) Poor  Pain is worse with: walking, bending, sitting, standing, and some activites Pain improves with: rest, heat/ice, medication, and streching Relief from Meds: 5  Family History  Problem Relation Age of Onset   Hypertension Father    Diabetes Father    Stroke Father    Congestive Heart Failure Father    Lymphoma Paternal Uncle    Ovarian cancer Maternal Grandmother 66   Social History   Socioeconomic History   Marital status: Married    Spouse name: Not on file   Number of children: Not on file   Years of education: Not on file   Highest education level: Not on file  Occupational History   Occupation: Disabled  Tobacco Use   Smoking status: Former    Current packs/day: 0.00    Average packs/day: 0.3 packs/day for 10.0 years (3.0 ttl pk-yrs)    Types: Cigarettes    Start date: 10/01/2006    Quit date: 09/30/2016    Years since quitting: 6.7    Passive exposure: Never   Smokeless tobacco: Never  Vaping Use   Vaping status: Never Used  Substance and Sexual Activity   Alcohol use: No   Drug use: No   Sexual activity: Yes    Birth control/protection:  Condom  Other Topics Concern   Not on file  Social History Narrative   Not on file   Social Drivers of Health   Financial Resource Strain: Not on file  Food Insecurity: Not on file  Transportation Needs: Not on file  Physical Activity: Not on file  Stress: Not on file  Social Connections: Not on file   Past Surgical History:  Procedure Laterality Date   ARTHROSCOPY KNEE W/ DRILLING     right knee    ENDOBRONCHIAL ULTRASOUND Bilateral 03/04/2017   Procedure: ENDOBRONCHIAL ULTRASOUND;  Surgeon: Leslye Peer, MD;  Location: [facility];  Service: Cardiopulmonary;  Laterality: Bilateral;   left knee inner growth plate removed     to straighten leg   Past Surgical History:  Procedure Laterality Date   ARTHROSCOPY KNEE W/ DRILLING     right knee    ENDOBRONCHIAL ULTRASOUND Bilateral 03/04/2017   Procedure: ENDOBRONCHIAL ULTRASOUND;  Surgeon: Leslye Peer, MD;  [facility];  Service: Cardiopulmonary;  Laterality: Bilateral;   left knee inner growth plate removed     to straighten leg   Past Medical History:  Diagnosis Date   Anxiety    Depression    Dyspnea    Eczema of hand    Headache    hx of with tooth pain  Obese    Pneumonia    12/2016   Pre-diabetes    Sarcoidosis    Sleep apnea    cpap   BP 120/77   Pulse 61   Ht 5\' 8"  (1.727 m)   Wt (!) 572 lb (259.5 kg)   SpO2 95%   BMI 86.97 kg/m   Opioid Risk Score:   Fall Risk Score:  `1  Depression screen [facility] 2/9     07/11/2023    8:56 AM 05/12/2023    8:26 AM 02/11/2023    9:48 AM 12/13/2022    9:40 AM 11/15/2022    9:31 AM 10/17/2022    9:55 AM 07/22/2022    9:53 AM  Depression screen PHQ 2/9  Decreased Interest 0 0 0 0 1 0 0  Down, Depressed, Hopeless 0 0 0 0 0 0 0  PHQ - 2 Score 0 0 0 0 1 0 0    Review of Systems  Musculoskeletal:  Positive for back pain.       Pain in both knees  All other systems reviewed and are negative.      Objective:   Physical Exam Vitals and nursing  note reviewed.  Constitutional:      Appearance: Normal appearance. He is obese.  Cardiovascular:     Rate and Rhythm: Normal rate and regular rhythm.     Pulses: Normal pulses.     Heart sounds: Normal heart sounds.  Pulmonary:     Effort: Pulmonary effort is normal.     Breath sounds: Normal breath sounds.  Musculoskeletal:     Comments: Normal Muscle Bulk and Muscle Testing Reveals:  Upper Extremities: Full ROM and Muscle Strength 5/5  Thoracic Paraspinal Tenderness: T-1-T-4 Lumbar Paraspinal Tenderness: L-4-L-5 Lower Extremities: Decreased ROM and Muscle Strength 5/5 Bilateral Lower Extremities Flexion Produces Pain into his Bilateral Patella's Arises from Table slowly Antalgic  Gait     Skin:    General: Skin is warm and dry.  Neurological:     Mental Status: He is alert and oriented to person, place, and time.  Psychiatric:        Mood and Affect: Mood normal.        Behavior: Behavior normal.         Assessment & Plan:  1.Right Lumbar Radiculitis/ Spinal Stenosis : Continue HEP as Tolerated and Continue to Monitor. 07/11/2023 Refilled: : Oxycodone 10/325 mg one tablet 5 times a day as needed for pain #150. Second script sent for the following month. We will continue the opioid monitoring program, this consists of regular clinic visits, examinations, urine drug screen, pill counts as well as use of [place] Controlled Substance Reporting system. A 12 month History has been reviewed on the [place] Controlled Substance Reporting System on 07/11/2023.  2. Thoracic Back Pain: .Continue HEP as Tolerated . Continue to Monitor. 07/11/2023 3. Right Shoulder Pain: No Complaints today. Continue HEP as Tolerated and Continue to Monitor.02/282025 4. Chronic Pain Syndrome: Continue HEP as Tolerated. Continue to Monitor. 07/11/2023 5. Morbid Obesity: Continue Healthy Diet Regimen. Continue to Monitor. 07/11/2023 6. Chronic Bilateral Knee Pain:Continue HEP as Tolerated.  Continue to Monitor. 07/11/2023 7. Left Ankle Pain: No complaints today.  Ortho Following. 07/11/2023   F/U in 2 Months

## 2023-07-15 LAB — TOXASSURE SELECT,+ANTIDEPR,UR

## 2023-07-22 ENCOUNTER — Other Ambulatory Visit: Payer: Self-pay | Admitting: Emergency Medicine

## 2023-07-22 ENCOUNTER — Other Ambulatory Visit: Payer: Self-pay | Admitting: Registered Nurse

## 2023-08-13 ENCOUNTER — Other Ambulatory Visit: Payer: Self-pay | Admitting: Emergency Medicine

## 2023-08-22 ENCOUNTER — Other Ambulatory Visit: Payer: Self-pay | Admitting: Emergency Medicine

## 2023-08-22 NOTE — Telephone Encounter (Signed)
 Copied from CRM [id number]. Topic: Clinical - Medication Refill >> Aug 22, 2023  2:30 PM Konrad Dolores wrote: Most Recent Primary Care Visit:   Medication: predniSONE (DELTASONE) 10 MG tablet   Has the patient contacted their pharmacy? Yes; told to contact provider for a new med prescription. (Agent: If no, request that the patient contact the pharmacy for the refill. If patient does not wish to contact the pharmacy document the reason why and proceed with request.) (Agent: If yes, when and what did the pharmacy advise?)  Is this the correct pharmacy for this prescription? Yes If no, delete pharmacy and type the correct one.  This is the patient's preferred pharmacy:  [id number] - [place], [place] - [address] AT [facility] OF [address] [place] [address] Phone: [phone] Fax: [phone]   Has the prescription been filled recently? Yes  Is the patient out of the medication? Yes  Has the patient been seen for an appointment in the last year OR does the patient have an upcoming appointment? Yes  Can we respond through MyChart? Yes  Agent: Please be advised that Rx refills may take up to 3 business days. We ask that you follow-up with your pharmacy.

## 2023-08-25 ENCOUNTER — Telehealth: Payer: Self-pay | Admitting: Emergency Medicine

## 2023-08-25 NOTE — Telephone Encounter (Signed)
 PT would like 10 mg Pred refilled. Not seen in a year but has a FU appt. Pls fill or call PT at [phone]  [facility] on [address].

## 2023-08-27 ENCOUNTER — Telehealth: Payer: Self-pay

## 2023-08-27 MED ORDER — PREDNISONE 10 MG PO TABS: 10.0000 mg | ORAL_TABLET | Freq: Every day | ORAL | 0 refills | Status: DC

## 2023-08-27 NOTE — Telephone Encounter (Addendum)
 Called and spoke with Anthony [name]. Pt has been scheduled for Acute ov 4/24. Pred has been sent to pharmacy. Pt verbalized understanding & had no concerns. NFN

## 2023-08-27 NOTE — Telephone Encounter (Signed)
 Okay to refill for now and get him in for an appointment ASAP

## 2023-08-27 NOTE — Telephone Encounter (Signed)
 Spoke with pt. Rash still present, complains of wheezing during exertion. Please advise as pt is requesting Pred refill

## 2023-09-04 ENCOUNTER — Ambulatory Visit: Admitting: Pulmonary Disease

## 2023-09-04 ENCOUNTER — Ambulatory Visit: Admitting: Emergency Medicine

## 2023-09-04 ENCOUNTER — Encounter: Payer: Self-pay | Admitting: Emergency Medicine

## 2023-09-04 DIAGNOSIS — D862 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: Secondary | ICD-10-CM | POA: Diagnosis not present

## 2023-09-04 DIAGNOSIS — Z87891 Personal history of nicotine dependence: Secondary | ICD-10-CM

## 2023-09-04 DIAGNOSIS — G4733 Obstructive sleep apnea (adult) (pediatric): Secondary | ICD-10-CM

## 2023-09-04 MED ORDER — PREDNISONE 10 MG PO TABS: ORAL_TABLET | ORAL | 0 refills | Status: AC

## 2023-09-04 NOTE — Progress Notes (Deleted)
 Subjective:    Patient ID: Anthony [name], male    DOB: 06-25-1980, 43 y.o.   MRN: [id number]  HPI   Pain Inventory Average Pain {NUMBERS; 0-10:5044} Pain Right Now {NUMBERS; 0-10:5044} My pain is {PAIN DESCRIPTION:21022940}  In the last 24 hours, has pain interfered with the following? General activity {NUMBERS; 0-10:5044} Relation with others {NUMBERS; 0-10:5044} Enjoyment of life {NUMBERS; 0-10:5044} What TIME of day is your pain at its worst? {time of day:24191} Sleep (in general) {BHH GOOD/FAIR/POOR:22877}  Pain is worse with: {ACTIVITIES:21022942} Pain improves with: {PAIN IMPROVES WUJW:11914782} Relief from Meds: {NUMBERS; 0-10:5044}  Family History  Problem Relation Age of Onset   Hypertension Father    Diabetes Father    Stroke Father    Congestive Heart Failure Father    Lymphoma Paternal Uncle    Ovarian cancer Maternal Grandmother 3   Social History   Socioeconomic History   Marital status: Married    Spouse name: Not on file   Number of children: Not on file   Years of education: Not on file   Highest education level: Not on file  Occupational History   Occupation: Disabled  Tobacco Use   Smoking status: Former    Current packs/day: 0.00    Average packs/day: 0.3 packs/day for 10.0 years (3.0 ttl pk-yrs)    Types: Cigarettes    Start date: 10/01/2006    Quit date: 09/30/2016    Years since quitting: 6.9    Passive exposure: Never   Smokeless tobacco: Never  Vaping Use   Vaping status: Never Used  Substance and Sexual Activity   Alcohol use: No   Drug use: No   Sexual activity: Yes    Birth control/protection: Condom  Other Topics Concern   Not on file  Social History Narrative   Not on file   Social Drivers of Health   Financial Resource Strain: Not on file  Food Insecurity: Not on file  Transportation Needs: Not on file  Physical Activity: Not on file  Stress: Not on file  Social Connections: Not on file   Past Surgical History:   Procedure Laterality Date   ARTHROSCOPY KNEE W/ DRILLING     right knee    ENDOBRONCHIAL ULTRASOUND Bilateral 03/04/2017   Procedure: ENDOBRONCHIAL ULTRASOUND;  Surgeon: Denson Flake, MD;  Location: [facility];  Service: Cardiopulmonary;  Laterality: Bilateral;   left knee inner growth plate removed     to straighten leg   Past Surgical History:  Procedure Laterality Date   ARTHROSCOPY KNEE W/ DRILLING     right knee    ENDOBRONCHIAL ULTRASOUND Bilateral 03/04/2017   Procedure: ENDOBRONCHIAL ULTRASOUND;  Surgeon: Denson Flake, MD;  [facility];  Service: Cardiopulmonary;  Laterality: Bilateral;   left knee inner growth plate removed     to straighten leg   Past Medical History:  Diagnosis Date   Anxiety    Depression    Dyspnea    Eczema of hand    Headache    hx of with tooth pain   Obese    Pneumonia    12/2016   Pre-diabetes    Sarcoidosis    Sleep apnea    cpap   There were no vitals taken for this visit.  Opioid Risk Score:   Fall Risk Score:  `1  Depression screen [facility] 2/9     07/11/2023    8:56 AM 05/12/2023    8:26 AM 02/11/2023    9:48 AM 12/13/2022  9:40 AM 11/15/2022    9:31 AM 10/17/2022    9:55 AM 07/22/2022    9:53 AM  Depression screen PHQ 2/9  Decreased Interest 0 0 0 0 1 0 0  Down, Depressed, Hopeless 0 0 0 0 0 0 0  PHQ - 2 Score 0 0 0 0 1 0 0    Review of Systems     Objective:   Physical Exam        Assessment & Plan:

## 2023-09-04 NOTE — Patient Instructions (Addendum)
 We will send in additional prescription for your prednisone .  Please take 30 mg daily for the next 3 weeks, then decrease to 20 mg daily and stay on that dose until we see you in office.  At that time we can decide whether we can get you back down to your usual 10 mg daily. Keep albuterol  available use 2 puffs when needed for shortness of breath, chest tightness, wheezing. Continue your CPAP every night while sleeping.  We may need to perform a CPAP titration study to confirm that your current pressures are adequate and optimized.  Alternatively we could consider changing to an AutoSet mode with a range that goes higher than 12 cm water Follow-up with the either Dr. Baldwin Levee or APP in about 4-5 weeks to decide about your prednisone  dosing

## 2023-09-04 NOTE — Assessment & Plan Note (Signed)
 With OHS.  Has been on BiPAP in the past but currently on CPAP 12 cm water.  Good compliance confirmed today.  Question whether he may need to be changed to an auto titration device as he does not feel he is getting enough pressure.  Alternatively he may need a titration study to see if BiPAP would be superior to CPAP.

## 2023-09-04 NOTE — Progress Notes (Signed)
 Virtual Visit via Video Note  I connected with Anthony [name] on 09/04/23 at 11:00 AM EDT by a video enabled telemedicine application and verified that I am speaking with the correct person using two identifiers.  Location: Patient: Home Provider: Office   I discussed the limitations of evaluation and management by telemedicine and the availability of in person appointments. The patient expressed understanding and agreed to proceed.  History of Present Illness: 43 year old man with history of obesity and OSA/OHS on CPAP, pulmonary and cutaneous sarcoidosis for which he has required maintenance prednisone .  I have tried putting him on methotrexate  in the past, difficulty with effectiveness and also compliance with lab work, etc.   Observations/Objective: He is a lot more active these days, has been working, has noticed over the last 6 weeks that he has had increased exertional SOB. He also had some increased scaling and rash on his R > L palms.  He has been on prednisone  10mg  for several months with stable sx, but then began to get these sx. He increased to 30mg  every day, but then ran out and was on 0 for about 3 weeks. Minimal albuterol  use - only just had it refilled last week.   CPAP compliance good, feels that he is benefiting but ? Whether he needs more pressure.  Current compliance data between 08/05/2023 and 09/03/2023 available for review.  He is on a set pressure of 12 cmH2O.  He has 93% usage, 80% of the time for greater than 4 hours.  Minimal leak.   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  He has increased his prednisone  due to increasing skin symptoms on his palms, some increased dyspnea.  He then ran out of prednisone  and has been off for 2 weeks.  I think we need to treat him for a full course of treatment dose prednisone  30 mg daily for 3 weeks.  Then he can go to 20 mg daily and then follow-up with us  to  see if he is improved, stable to go to 10 mg daily.  He likely also needs repeat CT scan of the chest even if he does improve, last was in 12/2021.  Obstructive sleep apnea With OHS.  Has been on BiPAP in the past but currently on CPAP 12 cm water.  Good compliance confirmed today.  Question whether he may need to be changed to an auto titration device as he does not feel he is getting enough pressure.  Alternatively he may need a titration study to see if BiPAP would be superior to CPAP.   Follow Up Instructions: 5-6 weeks   I discussed the assessment and treatment plan with the patient. The patient was provided an opportunity to ask questions and all were answered. The patient agreed with the plan and demonstrated an understanding of the instructions.   The patient was advised to call back or seek an in-person evaluation if the symptoms worsen or if the condition fails to improve as anticipated.  I provided 31 minutes of non-face-to-face time during this encounter.   Denson Flake, MD

## 2023-09-04 NOTE — Assessment & Plan Note (Signed)
 Cutaneous and pulmonary sarcoidosis.  He has increased his prednisone  due to increasing skin symptoms on his palms, some increased dyspnea.  He then ran out of prednisone  and has been off for 2 weeks.  I think we need to treat him for a full course of treatment dose prednisone  30 mg daily for 3 weeks.  Then he can go to 20 mg daily and then follow-up with us  to see if he is improved, stable to go to 10 mg daily.  He likely also needs repeat CT scan of the chest even if he does improve, last was in 12/2021.

## 2023-09-05 ENCOUNTER — Encounter
Payer: No Typology Code available for payment source | Attending: Physical Medicine and Rehabilitation | Admitting: Registered Nurse

## 2023-09-05 DIAGNOSIS — Z5181 Encounter for therapeutic drug level monitoring: Secondary | ICD-10-CM | POA: Insufficient documentation

## 2023-09-05 DIAGNOSIS — G894 Chronic pain syndrome: Secondary | ICD-10-CM | POA: Insufficient documentation

## 2023-09-05 DIAGNOSIS — Z79891 Long term (current) use of opiate analgesic: Secondary | ICD-10-CM | POA: Insufficient documentation

## 2023-09-17 NOTE — Progress Notes (Unsigned)
 Subjective:    Patient ID: Anthony [name], male    DOB: 29-Jan-1981, 43 y.o.   MRN: [id number]  HPI: Anthony [name] is a 43 y.o. male who returns for follow up appointment for chronic pain and medication refill. He states his pain is located in his upper back, lower back radiating into his right lower extremity and bilateral knee pain. He rates his pain 7. His current exercise regime is walking and performing stretching exercises.  Anthony [name] equivalent is 75.00  MME.   Last UDS was Performed on 07/11/2023, it was consistent.     Pain Inventory Average Pain 7 Pain Right Now 7 My pain is sharp, burning, dull, stabbing, and aching  In the last 24 hours, has pain interfered with the following? General activity 10 Relation with others 10 Enjoyment of life 10 What TIME of day is your pain at its worst? morning , daytime, evening, and night Sleep (in general) Poor  Pain is worse with: walking, bending, sitting, and standing Pain improves with: rest and medication Relief from Meds: 5  Family History  Problem Relation Age of Onset   Hypertension Father    Diabetes Father    Stroke Father    Congestive Heart Failure Father    Lymphoma Paternal Uncle    Ovarian cancer Maternal Grandmother 103   Social History   Socioeconomic History   Marital status: Married    Spouse name: Not on file   Number of children: Not on file   Years of education: Not on file   Highest education level: Not on file  Occupational History   Occupation: Disabled  Tobacco Use   Smoking status: Former    Current packs/day: 0.00    Average packs/day: 0.3 packs/day for 10.0 years (3.0 ttl pk-yrs)    Types: Cigarettes    Start date: 10/01/2006    Quit date: 09/30/2016    Years since quitting: 6.9    Passive exposure: Never   Smokeless tobacco: Never  Vaping Use   Vaping status: Never Used  Substance and Sexual Activity   Alcohol use: No   Drug use: No   Sexual activity: Yes    Birth  control/protection: Condom  Other Topics Concern   Not on file  Social History Narrative   Not on file   Social Drivers of Health   Financial Resource Strain: Not on file  Food Insecurity: Not on file  Transportation Needs: Not on file  Physical Activity: Not on file  Stress: Not on file  Social Connections: Not on file   Past Surgical History:  Procedure Laterality Date   ARTHROSCOPY KNEE W/ DRILLING     right knee    ENDOBRONCHIAL ULTRASOUND Bilateral 03/04/2017   Procedure: ENDOBRONCHIAL ULTRASOUND;  Surgeon: Denson Flake, MD;  [facility];  Service: Cardiopulmonary;  Laterality: Bilateral;   left knee inner growth plate removed     to straighten leg   Past Surgical History:  Procedure Laterality Date   ARTHROSCOPY KNEE W/ DRILLING     right knee    ENDOBRONCHIAL ULTRASOUND Bilateral 03/04/2017   Procedure: ENDOBRONCHIAL ULTRASOUND;  Surgeon: Denson Flake, MD;  [facility];  Service: Cardiopulmonary;  Laterality: Bilateral;   left knee inner growth plate removed     to straighten leg   Past Medical History:  Diagnosis Date   Anxiety    Depression    Dyspnea    Eczema of hand    Headache  hx of with tooth pain   Obese    Pneumonia    12/2016   Pre-diabetes    Sarcoidosis    Sleep apnea    cpap   There were no vitals taken for this visit.  Opioid Risk Score:   Fall Risk Score:  `1  Depression screen [facility] 2/9     07/11/2023    8:56 AM 05/12/2023    8:26 AM 02/11/2023    9:48 AM 12/13/2022    9:40 AM 11/15/2022    9:31 AM 10/17/2022    9:55 AM 07/22/2022    9:53 AM  Depression screen PHQ 2/9  Decreased Interest 0 0 0 0 1 0 0  Down, Depressed, Hopeless 0 0 0 0 0 0 0  PHQ - 2 Score 0 0 0 0 1 0 0     Review of Systems  Musculoskeletal:  Positive for back pain.       Bilateral knee pain Right outer leg pain  All other systems reviewed and are negative.     Objective:   Physical Exam Vitals and nursing note reviewed.   Constitutional:      Appearance: Normal appearance. He is obese.  Cardiovascular:     Rate and Rhythm: Normal rate and regular rhythm.     Pulses: Normal pulses.     Heart sounds: Normal heart sounds.  Pulmonary:     Effort: Pulmonary effort is normal.     Breath sounds: Normal breath sounds.  Musculoskeletal:     Comments: Normal Muscle Bulk and Muscle Testing Reveals:  Upper Extremities: Full ROM and Muscle Strength 5/5  Thoracic Paraspinal Tenderness: T-1-T-2  Lumbar Paraspinal Tenderness: L-4-l_5 Lower Extremities: Full ROM and Muscle Strength 5/5 Arises from Table slowly Antalgic Gait     Skin:    General: Skin is warm and dry.  Neurological:     Mental Status: He is alert and oriented to person, place, and time.  Psychiatric:        Mood and Affect: Mood normal.        Behavior: Behavior normal.         Assessment & Plan:  1.Right Lumbar Radiculitis/ Spinal Stenosis : Continue HEP as Tolerated and Continue to Monitor. 09/18/2023 Refilled: : Oxycodone  10/325 mg one tablet 5 times a day as needed for pain #150. Second script sent for the following month. We will continue the opioid monitoring program, this consists of regular clinic visits, examinations, urine drug screen, pill counts as well as use of [place]  Controlled Substance Reporting system. A 12 month History has been reviewed on the [place]  Controlled Substance Reporting System on 09/18/2023.  2. Thoracic Back Pain: .Continue HEP as Tolerated . Continue to Monitor. 09/18/2023 3. Right Shoulder Pain: No Complaints today. Continue HEP as Tolerated and Continue to Monitor.05/082025 4. Chronic Pain Syndrome: Continue HEP as Tolerated. Continue to Monitor. 09/18/2023 5. Morbid Obesity: Continue Healthy Diet Regimen. Continue to Monitor. 09/18/2023 6. Chronic Bilateral Knee Pain:Continue HEP as Tolerated. Continue to Monitor. 09/18/2023 7. Left Ankle Pain: No complaints today.  Ortho Following.  09/18/2023   F/U in 2 Months

## 2023-09-18 ENCOUNTER — Encounter: Payer: Self-pay | Admitting: Registered Nurse

## 2023-09-18 ENCOUNTER — Encounter: Attending: Physical Medicine and Rehabilitation | Admitting: Registered Nurse

## 2023-09-18 VITALS — BP 159/77 | HR 78 | Ht 68.0 in | Wt >= 6400 oz

## 2023-09-18 DIAGNOSIS — M546 Pain in thoracic spine: Secondary | ICD-10-CM | POA: Diagnosis present

## 2023-09-18 DIAGNOSIS — G894 Chronic pain syndrome: Secondary | ICD-10-CM | POA: Diagnosis present

## 2023-09-18 DIAGNOSIS — G8929 Other chronic pain: Secondary | ICD-10-CM | POA: Insufficient documentation

## 2023-09-18 DIAGNOSIS — M48062 Spinal stenosis, lumbar region with neurogenic claudication: Secondary | ICD-10-CM | POA: Insufficient documentation

## 2023-09-18 DIAGNOSIS — Z79891 Long term (current) use of opiate analgesic: Secondary | ICD-10-CM | POA: Diagnosis present

## 2023-09-18 DIAGNOSIS — M25561 Pain in right knee: Secondary | ICD-10-CM | POA: Insufficient documentation

## 2023-09-18 DIAGNOSIS — Z5181 Encounter for therapeutic drug level monitoring: Secondary | ICD-10-CM | POA: Insufficient documentation

## 2023-09-18 DIAGNOSIS — M25562 Pain in left knee: Secondary | ICD-10-CM | POA: Diagnosis present

## 2023-09-18 DIAGNOSIS — M5416 Radiculopathy, lumbar region: Secondary | ICD-10-CM | POA: Diagnosis present

## 2023-09-18 MED ORDER — OXYCODONE-ACETAMINOPHEN 10-325 MG PO TABS
1.0000 | ORAL_TABLET | Freq: Every day | ORAL | 0 refills | Status: DC | PRN
Start: 2023-09-18 — End: 2023-09-26

## 2023-09-18 MED ORDER — OXYCODONE-ACETAMINOPHEN 10-325 MG PO TABS: 1.0000 | ORAL_TABLET | Freq: Every day | ORAL | 0 refills | Status: DC | PRN

## 2023-09-25 ENCOUNTER — Telehealth: Payer: Self-pay

## 2023-09-25 NOTE — Telephone Encounter (Signed)
 This encounter was created in error - please disregard.

## 2023-09-25 NOTE — Telephone Encounter (Signed)
 PA faxed to [facility] for Oxy/APAP

## 2023-09-25 NOTE — Telephone Encounter (Signed)
 Copied from CRM [id number]. Topic: Clinical - Medication Refill >> Sep 25, 2023  5:06 PM Felizardo Hotter wrote: Medication: oxyCODONE -acetaminophen  (PERCOCET ) 10-325 MG tablet  Has the patient contacted their pharmacy? Yes (Agent: If no, request that the patient contact the pharmacy for the refill. If patient does not wish to contact the pharmacy document the reason why and proceed with request.) (Agent: If yes, when and what did the pharmacy advise?)  This is the patient's preferred pharmacy:   [id number] - [place],  - [address] AT [address] [place] [address] Phone: [phone] Fax: [phone]  Is this the correct pharmacy for this prescription? Yes If no, delete pharmacy and type the correct one.   Has the prescription been filled recently? Yes  Is the patient out of the medication? Yes  Has the patient been seen for an appointment in the last year OR does the patient have an upcoming appointment? Yes  Can we respond through MyChart? Yes  Agent: Please be advised that Rx refills may take up to 3 business days. We ask that you follow-up with your pharmacy.

## 2023-09-26 MED ORDER — OXYCODONE-ACETAMINOPHEN 10-325 MG PO TABS: 1.0000 | ORAL_TABLET | Freq: Every day | ORAL | 0 refills | Status: DC | PRN

## 2023-09-26 NOTE — Telephone Encounter (Signed)
 Denied.  Spoke with patient and said he has a [employer] gold card that he can use to get Oxy/APAP 10/325 mg. He picked up a 7 day supply on 09/18/23 and now he needs the remaining of his prescription.

## 2023-09-26 NOTE — Telephone Encounter (Signed)
 The prescription was sent [facility] but should have went to [facility]. [facility] deleted it so [facility]?

## 2023-10-01 ENCOUNTER — Telehealth: Payer: Self-pay | Admitting: Registered Nurse

## 2023-10-01 MED ORDER — OXYCODONE-ACETAMINOPHEN 10-325 MG PO TABS: 1.0000 | ORAL_TABLET | Freq: Every day | ORAL | 0 refills | Status: DC | PRN

## 2023-10-01 NOTE — Telephone Encounter (Signed)
 PMP was Reviewed.  Oxycodone  e-scribed to pharmacy.  My-Chart message sent to Anthony [name]

## 2023-10-01 NOTE — Telephone Encounter (Signed)
 Patient called in states medication needs a PA and he will be out of medication Friday

## 2023-10-02 ENCOUNTER — Telehealth: Payer: Self-pay | Admitting: *Deleted

## 2023-10-02 NOTE — Telephone Encounter (Signed)
 Dpoke with Mr. Gubbels today he verbalizes understanding.

## 2023-10-02 NOTE — Telephone Encounter (Signed)
 Anthony [name] is calling again about his hydrocodone message he sent through [employer]CH[employer] needing to be addressed.

## 2023-10-15 ENCOUNTER — Telehealth: Payer: Self-pay | Admitting: Registered Nurse

## 2023-10-15 NOTE — Telephone Encounter (Signed)
 Patient called in requesting medication refill on   oxyCODONE -acetaminophen  (PERCOCET ) 10-325 MG tablet   States he has been out for a couple days and is asking for 7 day supply - and would like [facility] to [facility] [address]

## 2023-10-16 ENCOUNTER — Telehealth: Payer: Self-pay | Admitting: Registered Nurse

## 2023-10-16 MED ORDER — OXYCODONE-ACETAMINOPHEN 10-325 MG PO TABS: 1.0000 | ORAL_TABLET | Freq: Every day | ORAL | 0 refills | Status: DC | PRN

## 2023-10-16 NOTE — Telephone Encounter (Signed)
 See previous telephone note. I have spoken with him about this.

## 2023-10-16 NOTE — Telephone Encounter (Signed)
 PMP was Reviewed.  Oxycodone  e-scribed to pharmacy.  Can someone look to see why they are only dispensing 7 days ? I believe his PA was denied and he stated he was going to pay.  Thanks

## 2023-10-16 NOTE — Telephone Encounter (Signed)
 I spoke with Mr Anthony [name] and he will just go to [facility] although [facility]. He says they told him because he needed to call his insurance is why they would only fill 7 days. He has not done that. He will try to get the full Rx at [facility] though it may be $150 with good rx

## 2023-10-22 ENCOUNTER — Other Ambulatory Visit: Payer: Self-pay | Admitting: Registered Nurse

## 2023-11-04 ENCOUNTER — Other Ambulatory Visit: Payer: Self-pay

## 2023-11-04 MED ORDER — DICLOFENAC SODIUM 75 MG PO TBEC: 75.0000 mg | DELAYED_RELEASE_TABLET | Freq: Two times a day (BID) | ORAL | 2 refills | Status: DC

## 2023-11-04 NOTE — Telephone Encounter (Signed)
 [name] called for a refill of Diclofenac  75 MG.

## 2023-11-06 ENCOUNTER — Telehealth: Admitting: Physician Assistant

## 2023-11-06 DIAGNOSIS — K047 Periapical abscess without sinus: Secondary | ICD-10-CM | POA: Diagnosis not present

## 2023-11-07 MED ORDER — AMOXICILLIN-POT CLAVULANATE 875-125 MG PO TABS: 1.0000 | ORAL_TABLET | Freq: Two times a day (BID) | ORAL | 0 refills | Status: AC

## 2023-11-07 NOTE — Progress Notes (Signed)

## 2023-11-09 ENCOUNTER — Other Ambulatory Visit: Payer: Self-pay | Admitting: Emergency Medicine

## 2023-11-10 ENCOUNTER — Other Ambulatory Visit: Payer: Self-pay | Admitting: *Deleted

## 2023-11-10 MED ORDER — PREDNISONE 10 MG PO TABS: 20.0000 mg | ORAL_TABLET | Freq: Every day | ORAL | 0 refills | Status: DC

## 2023-11-11 NOTE — Telephone Encounter (Signed)
 Error

## 2023-11-12 ENCOUNTER — Encounter: Attending: Physical Medicine and Rehabilitation | Admitting: Registered Nurse

## 2023-11-12 VITALS — BP 110/61 | HR 63 | Ht 68.0 in | Wt >= 6400 oz

## 2023-11-12 DIAGNOSIS — Z5181 Encounter for therapeutic drug level monitoring: Secondary | ICD-10-CM | POA: Insufficient documentation

## 2023-11-12 DIAGNOSIS — G8929 Other chronic pain: Secondary | ICD-10-CM | POA: Insufficient documentation

## 2023-11-12 DIAGNOSIS — M25562 Pain in left knee: Secondary | ICD-10-CM | POA: Insufficient documentation

## 2023-11-12 DIAGNOSIS — M48062 Spinal stenosis, lumbar region with neurogenic claudication: Secondary | ICD-10-CM | POA: Insufficient documentation

## 2023-11-12 DIAGNOSIS — M25561 Pain in right knee: Secondary | ICD-10-CM | POA: Diagnosis present

## 2023-11-12 DIAGNOSIS — Z79891 Long term (current) use of opiate analgesic: Secondary | ICD-10-CM | POA: Insufficient documentation

## 2023-11-12 DIAGNOSIS — G894 Chronic pain syndrome: Secondary | ICD-10-CM | POA: Insufficient documentation

## 2023-11-12 MED ORDER — OXYCODONE-ACETAMINOPHEN 10-325 MG PO TABS: 1.0000 | ORAL_TABLET | Freq: Every day | ORAL | 0 refills | Status: DC | PRN

## 2023-11-12 NOTE — Progress Notes (Signed)
 Subjective:    Patient ID: Anthony [name], male    DOB: 06-08-1980, 43 y.o.   MRN: [id number]  HPI: Anthony [name] is a 43 y.o. male who returns for follow up appointment for chronic pain and medication refill. He states his pain is located in his lower back and bilateral knees. He rates his pain 7. His current exercise regime is walking and performing stretching exercises.  Mr. Bankson Morphine equivalent is 60.00 MME.   UDS ordered today.     Pain Inventory Average Pain 7 Pain Right Now 7 My pain is sharp, burning, dull, and aching  In the last 24 hours, has pain interfered with the following? General activity 10 Relation with others 10 Enjoyment of life 10 What TIME of day is your pain at its worst? morning , daytime, evening, and night Sleep (in general) Poor  Pain is worse with: walking, bending, sitting, inactivity, and standing Pain improves with: rest, heat/ice, and medication Relief from Meds: 5  Family History  Problem Relation Age of Onset   Hypertension Father    Diabetes Father    Stroke Father    Congestive Heart Failure Father    Lymphoma Paternal Uncle    Ovarian cancer Maternal Grandmother 60   Social History   Socioeconomic History   Marital status: Married    Spouse name: Not on file   Number of children: Not on file   Years of education: Not on file   Highest education level: Not on file  Occupational History   Occupation: Disabled  Tobacco Use   Smoking status: Former    Current packs/day: 0.00    Average packs/day: 0.3 packs/day for 10.0 years (3.0 ttl pk-yrs)    Types: Cigarettes    Start date: 10/01/2006    Quit date: 09/30/2016    Years since quitting: 7.1    Passive exposure: Never   Smokeless tobacco: Never  Vaping Use   Vaping status: Never Used  Substance and Sexual Activity   Alcohol use: No   Drug use: No   Sexual activity: Yes    Birth control/protection: Condom  Other Topics Concern   Not on file  Social History  Narrative   Not on file   Social Drivers of Health   Financial Resource Strain: Not on file  Food Insecurity: Not on file  Transportation Needs: Not on file  Physical Activity: Not on file  Stress: Not on file  Social Connections: Not on file   Past Surgical History:  Procedure Laterality Date   ARTHROSCOPY KNEE W/ DRILLING     right knee    ENDOBRONCHIAL ULTRASOUND Bilateral 03/04/2017   Procedure: ENDOBRONCHIAL ULTRASOUND;  Surgeon: Shelah Lamar RAMAN, MD;  Location: [facility];  Service: Cardiopulmonary;  Laterality: Bilateral;   left knee inner growth plate removed     to straighten leg   Past Surgical History:  Procedure Laterality Date   ARTHROSCOPY KNEE W/ DRILLING     right knee    ENDOBRONCHIAL ULTRASOUND Bilateral 03/04/2017   Procedure: ENDOBRONCHIAL ULTRASOUND;  Surgeon: Shelah Lamar RAMAN, MD;  Location: [facility];  Service: Cardiopulmonary;  Laterality: Bilateral;   left knee inner growth plate removed     to straighten leg   Past Medical History:  Diagnosis Date   Anxiety    Depression    Dyspnea    Eczema of hand    Headache    hx of with tooth pain   Obese    Pneumonia  12/2016   Pre-diabetes    Sarcoidosis    Sleep apnea    cpap   BP 110/61   Pulse 63   Ht 5' 8 (1.727 m)   Wt (!) 562 lb (254.9 kg)   SpO2 95%   BMI 85.45 kg/m   Opioid Risk Score:   Fall Risk Score:  `1  Depression screen [facility] 2/9     07/11/2023    8:56 AM 05/12/2023    8:26 AM 02/11/2023    9:48 AM 12/13/2022    9:40 AM 11/15/2022    9:31 AM 10/17/2022    9:55 AM 07/22/2022    9:53 AM  Depression screen PHQ 2/9  Decreased Interest 0 0 0 0 1 0 0  Down, Depressed, Hopeless 0 0 0 0 0 0 0  PHQ - 2 Score 0 0 0 0 1 0 0     Review of Systems  Musculoskeletal:  Positive for back pain.       Bilateral knee pain Right leg pain  All other systems reviewed and are negative.      Objective:   Physical Exam Vitals and nursing note reviewed.  Constitutional:       Appearance: Normal appearance. He is obese.  Cardiovascular:     Rate and Rhythm: Normal rate and regular rhythm.     Pulses: Normal pulses.     Heart sounds: Normal heart sounds.  Pulmonary:     Effort: Pulmonary effort is normal.     Breath sounds: Normal breath sounds.  Musculoskeletal:     Comments: Normal Muscle Bulk and Muscle Testing Reveals:  Upper Extremities: Full ROM and Muscle Strength 5/5 Lumbar paraspinal Tenderness: L-4-L-5 Lower Extremities : Decreased ROM and Muscle Strength 5/5 Bilateral Lower extremities Flexion Produces Pain into his Bilateral Patella's Arises from Table slowly Antalgic Gait     Skin:    General: Skin is warm and dry.  Neurological:     Mental Status: He is alert and oriented to person, place, and time.  Psychiatric:        Mood and Affect: Mood normal.        Behavior: Behavior normal.          Assessment & Plan:  1.Chronic Low Back Pain/ Right Lumbar Radiculitis/ Spinal Stenosis : Continue HEP as Tolerated and Continue to Monitor. 11/12/2023 Refilled: : Oxycodone  10/325 mg one tablet 5 times a day as needed for pain #150. Second script sent for the following month. We will continue the opioid monitoring program, this consists of regular clinic visits, examinations, urine drug screen, pill counts as well as use of [place]  Controlled Substance Reporting system. A 12 month History has been reviewed on the [place]  Controlled Substance Reporting System on 11/12/2023.  2. Thoracic Back Pain: .Continue HEP as Tolerated . Continue to Monitor. 11/12/2023 3. Right Shoulder Pain: No Complaints today. Continue HEP as Tolerated and Continue to Monitor.11/12/2023 4. Chronic Pain Syndrome: Continue HEP as Tolerated. Continue to Monitor. 11/12/2023 5. Morbid Obesity: Continue Healthy Diet Regimen. Continue to Monitor. 11/12/2023 6. Chronic Bilateral Knee Pain:Continue HEP as Tolerated. Continue to Monitor. 11/12/2023 7. Left Ankle Pain: No  complaints today.  Ortho Following. 11/12/2023   F/U in 2 Months

## 2023-11-19 LAB — TOXASSURE SELECT,+ANTIDEPR,UR

## 2023-11-22 ENCOUNTER — Encounter: Payer: Self-pay | Admitting: Registered Nurse

## 2023-12-10 ENCOUNTER — Other Ambulatory Visit: Payer: Self-pay | Admitting: Emergency Medicine

## 2023-12-10 MED ORDER — PREDNISONE 10 MG PO TABS: 20.0000 mg | ORAL_TABLET | Freq: Every day | ORAL | 0 refills | Status: DC

## 2023-12-10 NOTE — Telephone Encounter (Signed)
 Copied from CRM [id number]. Topic: Clinical - Medication Refill >> Dec 10, 2023 12:36 PM Corean SAUNDERS wrote: Medication: predniSONE  (DELTASONE ) 10 MG tablet  Has the patient contacted their pharmacy? No, out of refills  (Agent: If no, request that the patient contact the pharmacy for the refill. If patient does not wish to contact the pharmacy document the reason why and proceed with request.) (Agent: If yes, when and what did the pharmacy advise?)  This is the patient's preferred pharmacy:  [id number] - [place], [place] - [address] AT [facility] OF [address] [place] [address] Phone: [phone] Fax: [phone]     Is this the correct pharmacy for this prescription? Yes If no, delete pharmacy and type the correct one.   Has the prescription been filled recently? Unknown  Is the patient out of the medication? Yes  Has the patient been seen for an appointment in the last year OR does the patient have an upcoming appointment? Yes  Can we respond through MyChart? No  Agent: Please be advised that Rx refills may take up to 3 business days. We ask that you follow-up with your pharmacy.

## 2023-12-31 ENCOUNTER — Other Ambulatory Visit: Payer: Self-pay | Admitting: Emergency Medicine

## 2024-01-13 ENCOUNTER — Encounter: Payer: Self-pay | Attending: Physical Medicine and Rehabilitation | Admitting: Registered Nurse

## 2024-01-13 ENCOUNTER — Encounter: Payer: Self-pay | Admitting: Registered Nurse

## 2024-01-13 VITALS — BP 167/71 | HR 79 | Ht 68.0 in | Wt >= 6400 oz

## 2024-01-13 DIAGNOSIS — M48062 Spinal stenosis, lumbar region with neurogenic claudication: Secondary | ICD-10-CM | POA: Insufficient documentation

## 2024-01-13 DIAGNOSIS — Z5181 Encounter for therapeutic drug level monitoring: Secondary | ICD-10-CM | POA: Insufficient documentation

## 2024-01-13 DIAGNOSIS — Z79891 Long term (current) use of opiate analgesic: Secondary | ICD-10-CM | POA: Insufficient documentation

## 2024-01-13 DIAGNOSIS — M5416 Radiculopathy, lumbar region: Secondary | ICD-10-CM | POA: Insufficient documentation

## 2024-01-13 DIAGNOSIS — G894 Chronic pain syndrome: Secondary | ICD-10-CM | POA: Insufficient documentation

## 2024-01-13 DIAGNOSIS — G8929 Other chronic pain: Secondary | ICD-10-CM | POA: Insufficient documentation

## 2024-01-13 DIAGNOSIS — M25562 Pain in left knee: Secondary | ICD-10-CM | POA: Insufficient documentation

## 2024-01-13 DIAGNOSIS — M25561 Pain in right knee: Secondary | ICD-10-CM | POA: Insufficient documentation

## 2024-01-13 MED ORDER — OXYCODONE-ACETAMINOPHEN 10-325 MG PO TABS: 1.0000 | ORAL_TABLET | Freq: Every day | ORAL | 0 refills | Status: DC | PRN

## 2024-01-13 NOTE — Progress Notes (Signed)
 Subjective:    Patient ID: Anthony [name], male    DOB: 1981-03-26, 43 y.o.   MRN: [id number]  HPI: Anthony [name] is a 43 y.o. male who returns for follow up appointment for chronic pain and medication refill. He states his pain is located in his lower back radiating into his right lower extremity and bilateral knee pain. He rates his pain 7. His current exercise regime is walking and performing stretching exercises.  Anthony [name] Morphine equivalent is 75.00 MME.   Last UDS was Performed on 11/12/2023, it was consistent.    Pain Inventory Average Pain 7 Pain Right Now 7 My pain is sharp, burning, dull, stabbing, and aching  In the last 24 hours, has pain interfered with the following? General activity 10 Relation with others 10 Enjoyment of life 10 What TIME of day is your pain at its worst? morning , daytime, evening, and night Sleep (in general) Poor  Pain is worse with: walking, bending, sitting, inactivity, standing, and some activites Pain improves with: rest and medication Relief from Meds: 5  Family History  Problem Relation Age of Onset   Hypertension Father    Diabetes Father    Stroke Father    Congestive Heart Failure Father    Lymphoma Paternal Uncle    Ovarian cancer Maternal Grandmother 6   Social History   Socioeconomic History   Marital status: Married    Spouse name: Not on file   Number of children: Not on file   Years of education: Not on file   Highest education level: Not on file  Occupational History   Occupation: Disabled  Tobacco Use   Smoking status: Former    Current packs/day: 0.00    Average packs/day: 0.3 packs/day for 10.0 years (3.0 ttl pk-yrs)    Types: Cigarettes    Start date: 10/01/2006    Quit date: 09/30/2016    Years since quitting: 7.2    Passive exposure: Never   Smokeless tobacco: Never  Vaping Use   Vaping status: Never Used  Substance and Sexual Activity   Alcohol use: No   Drug use: No   Sexual activity: Yes     Birth control/protection: Condom  Other Topics Concern   Not on file  Social History Narrative   Not on file   Social Drivers of Health   Financial Resource Strain: Not on file  Food Insecurity: Not on file  Transportation Needs: Not on file  Physical Activity: Not on file  Stress: Not on file  Social Connections: Not on file   Past Surgical History:  Procedure Laterality Date   ARTHROSCOPY KNEE W/ DRILLING     right knee    ENDOBRONCHIAL ULTRASOUND Bilateral 03/04/2017   Procedure: ENDOBRONCHIAL ULTRASOUND;  Surgeon: Shelah Lamar RAMAN, MD;  Location: [facility];  Service: Cardiopulmonary;  Laterality: Bilateral;   left knee inner growth plate removed     to straighten leg   Past Surgical History:  Procedure Laterality Date   ARTHROSCOPY KNEE W/ DRILLING     right knee    ENDOBRONCHIAL ULTRASOUND Bilateral 03/04/2017   Procedure: ENDOBRONCHIAL ULTRASOUND;  Surgeon: Shelah Lamar RAMAN, MD;  Location: [facility];  Service: Cardiopulmonary;  Laterality: Bilateral;   left knee inner growth plate removed     to straighten leg   Past Medical History:  Diagnosis Date   Anxiety    Depression    Dyspnea    Eczema of hand    Headache  hx of with tooth pain   Obese    Pneumonia    12/2016   Pre-diabetes    Sarcoidosis    Sleep apnea    cpap   BP (!) 161/82 (BP Location: Left Arm, Patient Position: Sitting, Cuff Size: Large)   Pulse 79   Ht 5' 8 (1.727 m)   Wt (!) 567 lb 3.2 oz (257.3 kg)   SpO2 98%   BMI 86.24 kg/m   Opioid Risk Score:   Fall Risk Score:  `1  Depression screen [facility] 2/9     01/13/2024    9:25 AM 07/11/2023    8:56 AM 05/12/2023    8:26 AM 02/11/2023    9:48 AM 12/13/2022    9:40 AM 11/15/2022    9:31 AM 10/17/2022    9:55 AM  Depression screen PHQ 2/9  Decreased Interest 0 0 0 0 0 1 0  Down, Depressed, Hopeless 0 0 0 0 0 0 0  PHQ - 2 Score 0 0 0 0 0 1 0      Review of Systems  Musculoskeletal:  Positive for back pain, joint swelling and  myalgias.       Low back pain, pain across shoulders , right hip pain radiating down leg, bilateral knee pain  All other systems reviewed and are negative.      Objective:   Physical Exam Vitals and nursing note reviewed.  Constitutional:      Appearance: Normal appearance. He is obese.  Cardiovascular:     Rate and Rhythm: Normal rate and regular rhythm.     Pulses: Normal pulses.     Heart sounds: Normal heart sounds.  Pulmonary:     Effort: Pulmonary effort is normal.     Breath sounds: Normal breath sounds.  Musculoskeletal:     Comments: Normal Muscle Bulk and Muscle Testing Reveals:  Upper Extremities: Full ROM and Muscle Strength  5/5  Lumbar Paraspinal Tenderness: L-4-L-5 Right Greater Trochanter Tenderness Lower Extremities: Full ROM and Muscle Strength 5/5 Bilateral Lower Extremity Flexion Produces paoin into his Bilateral Patella's  Arises from Table Slowly Antalgic  Gait     Skin:    General: Skin is warm and dry.  Neurological:     Mental Status: He is alert and oriented to person, place, and time.  Psychiatric:        Mood and Affect: Mood normal.        Behavior: Behavior normal.          Assessment & Plan:  1.Chronic Low Back Pain/ Right Lumbar Radiculitis/ Spinal Stenosis : Continue HEP as Tolerated and Continue to Monitor. 01/13/2024 Refilled: : Oxycodone  10/325 mg one tablet 5 times a day as needed for pain #150. Second script sent for the following month. We will continue the opioid monitoring program, this consists of regular clinic visits, examinations, urine drug screen, pill counts as well as use of [place]  Controlled Substance Reporting system. A 12 month History has been reviewed on the [place]  Controlled Substance Reporting System on 11/12/2023.  2. Thoracic Back Pain: .Continue HEP as Tolerated . Continue to Monitor. 01/13/2024 3. Right Shoulder Pain: No Complaints today. Continue HEP as Tolerated and Continue to  Monitor.01/13/2024 4. Chronic Pain Syndrome: Continue HEP as Tolerated. Continue to Monitor. 01/13/2024 5. Morbid Obesity: Continue Healthy Diet Regimen. Continue to Monitor. 01/13/2024 6. Chronic Bilateral Knee Pain:Continue HEP as Tolerated. Continue to Monitor. 01/13/2024 7. Left Ankle Pain: No complaints today.  Ortho Following. 01/13/2024  F/U in 2 Months

## 2024-01-13 NOTE — Patient Instructions (Signed)
 Keep Blood Pressure Log and F/U with your Primary Care Physician

## 2024-01-26 ENCOUNTER — Other Ambulatory Visit: Payer: Self-pay | Admitting: Emergency Medicine

## 2024-01-30 ENCOUNTER — Other Ambulatory Visit: Payer: Self-pay

## 2024-02-02 ENCOUNTER — Telehealth: Payer: Self-pay

## 2024-02-02 NOTE — Telephone Encounter (Signed)
 Left message on VM.  Patient has OV with Dr. Shelah on 02/02/2024.  Per chart note from last OV on 09/04/2023:  He likely also needs repeat CT scan of the chest even if he does improve, last was in 12/2021.   Left message to ask patient if he needs to reschedule this appointment until he gets the CT scan and then reschedule the appointment with Dr. Shelah to go over the CT scan.

## 2024-02-04 ENCOUNTER — Telehealth: Payer: Self-pay | Admitting: Registered Nurse

## 2024-02-04 ENCOUNTER — Ambulatory Visit: Admitting: Emergency Medicine

## 2024-02-04 MED ORDER — DICLOFENAC SODIUM 75 MG PO TBEC: 75.0000 mg | DELAYED_RELEASE_TABLET | Freq: Two times a day (BID) | ORAL | 2 refills | Status: DC

## 2024-02-04 NOTE — Telephone Encounter (Signed)
 Call placed to Anthony [name], he is taking the Diclofenac .  Prescription sent to pharmacy, he verbalizes understanding.

## 2024-02-25 ENCOUNTER — Encounter: Payer: Self-pay | Admitting: Emergency Medicine

## 2024-02-25 ENCOUNTER — Ambulatory Visit (INDEPENDENT_AMBULATORY_CARE_PROVIDER_SITE_OTHER): Payer: Self-pay | Admitting: Emergency Medicine

## 2024-02-25 VITALS — BP 148/80 | HR 90 | Temp 98.5°F | Ht 68.0 in | Wt >= 6400 oz

## 2024-02-25 DIAGNOSIS — G4733 Obstructive sleep apnea (adult) (pediatric): Secondary | ICD-10-CM

## 2024-02-25 DIAGNOSIS — D862 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: Secondary | ICD-10-CM

## 2024-02-25 MED ORDER — PREDNISONE 10 MG PO TABS: 20.0000 mg | ORAL_TABLET | Freq: Every day | ORAL | 5 refills | Status: AC

## 2024-02-25 NOTE — Patient Instructions (Addendum)
  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 We discussed your current treatments and made some adjustments to your medication and management plan.  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 You are currently using a CPAP machine at a pressure of 12 cm H2O, which is effectively managing your condition. We will do a trial of going back on your BiPAP machine (27/22) to see if this is better. Depending on how you do we may also consider a repeat BiPAP titration sleep study.   -PULMONARY AND CUTANEOUS SARCOIDOSIS: Sarcoidosis is a condition where inflammatory cells grow in different parts of your body, including your lungs and skin. You are currently taking prednisone  20 mg daily, which has been effective. We will start tapering your prednisone  to 15 mg daily for one month, and then to 10 mg daily if you tolerate it well. This is to minimize the side effects of long-term steroid use. You will be prescribed enough prednisone  to ensure you have an adequate supply.  -OBESITY: Your weight is contributing to your breathing issues and may affect the management of your sarcoidosis. We discussed the importance of weight management to improve your overall health and breathing.  -EDEMA OF LOWER EXTREMITIES: You have mild swelling in your lower legs. This will be monitored to see if it improves with your current treatment plan.  -CHRONIC JOINT PAIN: You have ongoing joint pain. We acknowledge this issue and will continue to monitor it.  INSTRUCTIONS: Please schedule a follow-up appointment in 3-4 months to assess how you are responding to the prednisone  taper and  to evaluate the effectiveness of your BiPAP trial.

## 2024-02-25 NOTE — Progress Notes (Signed)
 Subjective:    Patient ID: Anthony [name], male    DOB: 1980-12-06, 43 y.o.   MRN: [id number]  HPI Discussed the use of AI scribe software for clinical note transcription with the patient, who gave verbal consent to proceed.  History of Present Illness Anthony [name] is a 43 year old male with massive obesity, obstructive sleep apnea (OSA), obesity hypoventilation syndrome (OHS), and sarcoidosis who presents for follow-up of his conditions.  He has been using CPAP at a pressure of 12 cm H2O for several months, with compliance data showing usage greater than four hours on 70% of days and for a portion of the day 90% of the time. He previously used BiPAP but switched back to his original CPAP machine due to feeling sleepy during the day with the newer machine. No daytime sleepiness or naps, and he maintains a full work schedule.  He has a history of pulmonary and cutaneous sarcoidosis. In April 2025, he experienced increased cutaneous involvement on the palms and increased dyspnea after running out of prednisone . He was treated with prednisone  30 mg daily for three weeks, then reduced to 20 mg daily. His hands are currently 'amazing' with no pain. He previously attempted to reduce prednisone  to 10 mg but experienced a recurrence of symptoms. He is currently taking 20 mg of prednisone  daily, split into two 10 mg doses.  He experiences joint pain and uses albuterol , which he keeps by his bedside, primarily using it when going up and down stairs. Albuterol  does not significantly help his breathing. Being on 20 mg of prednisone  has kept his breathing 'natural' given his weight, allowing him to manage daily activities like climbing stairs.  His last CT scan of the chest was in August 2023, and his last pulmonary function test was in 2019. He had a BiPAP titration study in January 2022, which showed benefit from pressures of 27/22 cm H2O, but he is currently using a CPAP at 12 cm H2O. He reports some  ankle swelling.    Results DIAGNOSTIC BiPAP titration: Pressures of 27/22 cm H2O beneficial (05/24/2020)    Review of Systems As per HPI  Past Medical History:  Diagnosis Date   Anxiety    Depression    Dyspnea    Eczema of hand    Headache    hx of with tooth pain   Obese    Pneumonia    12/2016   Pre-diabetes    Sarcoidosis    Sleep apnea    cpap    Family History  Problem Relation Age of Onset   Hypertension Father    Diabetes Father    Stroke Father    Congestive Heart Failure Father    Lymphoma Paternal Uncle    Ovarian cancer Maternal Grandmother 52     Social History   Socioeconomic History   Marital status: Married    Spouse name: Not on file   Number of children: Not on file   Years of education: Not on file   Highest education level: Not on file  Occupational History   Occupation: Disabled  Tobacco Use   Smoking status: Former    Current packs/day: 0.00    Average packs/day: 0.3 packs/day for 10.0 years (3.0 ttl pk-yrs)    Types: Cigarettes    Start date: 10/01/2006    Quit date: 09/30/2016    Years since quitting: 7.4    Passive exposure: Never   Smokeless tobacco: Never  Vaping Use   Vaping  status: Never Used  Substance and Sexual Activity   Alcohol use: No   Drug use: No   Sexual activity: Yes    Birth control/protection: Condom  Other Topics Concern   Not on file  Social History Narrative   Not on file   Social Drivers of Health   Financial Resource Strain: Not on file  Food Insecurity: Not on file  Transportation Needs: Not on file  Physical Activity: Not on file  Stress: Not on file  Social Connections: Not on file  Intimate Partner Violence: Not on file    Allergies  Allergen Reactions   Xarelto  [Rivaroxaban ]     Bleeding gums, weakness, eye swelling/bluring vision, intensified joint pain    Current Outpatient Medications on File Prior to Visit  Medication Sig Dispense Refill   albuterol  (VENTOLIN  HFA) 108 (90  Base) MCG/ACT inhaler INHALE 2 PUFFS INTO THE LUNGS EVERY 6 HOURS AS NEEDED FOR WHEEZING OR SHORTNESS OF BREATH 6.7 g 6   augmented betamethasone  dipropionate (DIPROLENE -AF) 0.05 % ointment SMARTSIG:Sparingly Topical Twice Daily     diclofenac  (VOLTAREN ) 75 MG EC tablet Take 1 tablet (75 mg total) by mouth 2 (two) times daily. 60 tablet 2   Menthol, Topical Analgesic, (ICY HOT EX) Apply 1 application topically daily as needed (muscle pain).     oxyCODONE -acetaminophen  (PERCOCET ) 10-325 MG tablet Take 1 tablet by mouth 5 (five) times daily as needed for pain. 150 tablet 0   amoxicillin -clavulanate (AUGMENTIN ) 875-125 MG tablet Take 1 tablet by mouth 2 (two) times daily. (Patient not taking: Reported on 02/25/2024) 14 tablet 0   ibuprofen  (ADVIL ) 600 MG tablet Take 1 tablet (600 mg total) by mouth every 6 (six) hours as needed. (Patient not taking: Reported on 02/25/2024) 30 tablet 0   predniSONE  (DELTASONE ) 10 MG tablet TAKE 2 TABLETS(20 MG) BY MOUTH DAILY WITH BREAKFAST 60 tablet 0   No current facility-administered medications on file prior to visit.       Objective:    Vitals:   02/25/24 1520  BP: (!) 148/80  Pulse: 90  Temp: 98.5 F (36.9 C)  SpO2: 98%  Weight: (!) 556 lb (252.2 kg)  Height: 5' 8 (1.727 m)   Physical Exam Gen: Pleasant, massively obese, in no distress,  normal affect  ENT: No lesions,  mouth clear,  oropharynx clear, no postnasal drip  Neck: No JVD, no stridor  Lungs: No use of accessory muscles, no crackles or wheezing on normal respiration, no wheeze on forced expiration  Cardiovascular: RRR, heart sounds normal, no murmur or gallops, no peripheral edema  Musculoskeletal: No deformities, no cyanosis or clubbing  Neuro: alert, awake, non focal  Skin: Warm, no cracking, few pale brown spots, no raised rash       Assessment & Plan:   Assessment & Plan   Assessment and Plan Assessment & Plan Obesity hypoventilation syndrome with obstructive sleep  apnea OHS with OSA managed on CPAP at 12 cm H2O. Compliance >4 hours on 70% of days. Good control of apneic events. No current daytime sleepiness. Discussed BiPAP benefits but current CPAP effective. - Will do a trial of his BiPAp machine to see if he tolerates, see if it is better. May consider a repeat BiPAP titration study depending on how he does.  - Monitor for daytime sleepiness and CPAP effectiveness. - Consider repeat sleep study if symptoms change or BiPAP trial ineffective.  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 Previous flare  with increased cutaneous involvement and dyspnea. Current dose effective with clear hands and well-managed breathing. Discussed tapering strategy to minimize side effects. - Reduce prednisone  to 15 mg daily for one month, then to 10 mg daily if tolerated. - Prescribe prednisone  as if continuing 20 mg daily to ensure supply.  Obesity Obesity contributing to hypoventilation and potentially impacting sarcoidosis management. Discussed weight impact on breathing and prednisone  side effects.  Edema of lower extremities Mild edema noted.  Chronic joint pain Chronic joint pain acknowledged.  Follow-up Follow-up discussed to monitor response to prednisone  taper and CPAP/BiPAP effectiveness. - Schedule follow-up appointment in 3-4 months to assess prednisone  taper and CPAP/BiPAP effectiveness.     Return in about 3 months (around 05/27/2024) for With Dr. Shelah.   Lamar Shelah, MD, PhD 02/25/2024, 3:54 PM [facility] [phone] or if no answer before 7:00PM call [phone] For any issues after 7:00PM please call eLink [phone]

## 2024-03-10 NOTE — Progress Notes (Deleted)
 Subjective:    Patient ID: Anthony [name], male    DOB: 1981/03/20, 43 y.o.   MRN: [id number]  HPI   Pain Inventory Average Pain {NUMBERS; 0-10:5044} Pain Right Now {NUMBERS; 0-10:5044} My pain is {PAIN DESCRIPTION:21022940}  In the last 24 hours, has pain interfered with the following? General activity {NUMBERS; 0-10:5044} Relation with others {NUMBERS; 0-10:5044} Enjoyment of life {NUMBERS; 0-10:5044} What TIME of day is your pain at its worst? {time of day:24191} Sleep (in general) {BHH GOOD/FAIR/POOR:22877}  Pain is worse with: {ACTIVITIES:21022942} Pain improves with: {PAIN IMPROVES TPUY:78977056} Relief from Meds: {NUMBERS; 0-10:5044}  Family History  Problem Relation Age of Onset   Hypertension Father    Diabetes Father    Stroke Father    Congestive Heart Failure Father    Lymphoma Paternal Uncle    Ovarian cancer Maternal Grandmother 19   Social History   Socioeconomic History   Marital status: Married    Spouse name: Not on file   Number of children: Not on file   Years of education: Not on file   Highest education level: Not on file  Occupational History   Occupation: Disabled  Tobacco Use   Smoking status: Former    Current packs/day: 0.00    Average packs/day: 0.3 packs/day for 10.0 years (3.0 ttl pk-yrs)    Types: Cigarettes    Start date: 10/01/2006    Quit date: 09/30/2016    Years since quitting: 7.4    Passive exposure: Never   Smokeless tobacco: Never  Vaping Use   Vaping status: Never Used  Substance and Sexual Activity   Alcohol use: No   Drug use: No   Sexual activity: Yes    Birth control/protection: Condom  Other Topics Concern   Not on file  Social History Narrative   Not on file   Social Drivers of Health   Financial Resource Strain: Not on file  Food Insecurity: Not on file  Transportation Needs: Not on file  Physical Activity: Not on file  Stress: Not on file  Social Connections: Not on file   Past Surgical History:   Procedure Laterality Date   ARTHROSCOPY KNEE W/ DRILLING     right knee    ENDOBRONCHIAL ULTRASOUND Bilateral 03/04/2017   Procedure: ENDOBRONCHIAL ULTRASOUND;  Surgeon: Shelah Lamar RAMAN, MD;  Location: [facility];  Service: Cardiopulmonary;  Laterality: Bilateral;   left knee inner growth plate removed     to straighten leg   Past Surgical History:  Procedure Laterality Date   ARTHROSCOPY KNEE W/ DRILLING     right knee    ENDOBRONCHIAL ULTRASOUND Bilateral 03/04/2017   Procedure: ENDOBRONCHIAL ULTRASOUND;  Surgeon: Shelah Lamar RAMAN, MD;  Location: [facility];  Service: Cardiopulmonary;  Laterality: Bilateral;   left knee inner growth plate removed     to straighten leg   Past Medical History:  Diagnosis Date   Anxiety    Depression    Dyspnea    Eczema of hand    Headache    hx of with tooth pain   Obese    Pneumonia    12/2016   Pre-diabetes    Sarcoidosis    Sleep apnea    cpap   There were no vitals taken for this visit.  Opioid Risk Score:   Fall Risk Score:  `1  Depression screen [place] 2/9     01/13/2024    9:25 AM 07/11/2023    8:56 AM 05/12/2023    8:26 AM 02/11/2023  9:48 AM 12/13/2022    9:40 AM 11/15/2022    9:31 AM 10/17/2022    9:55 AM  Depression screen PHQ 2/9  Decreased Interest 0 0 0 0 0 1 0  Down, Depressed, Hopeless 0 0 0 0 0 0 0  PHQ - 2 Score 0 0 0 0 0 1 0    Review of Systems     Objective:   Physical Exam        Assessment & Plan:

## 2024-03-11 ENCOUNTER — Encounter: Payer: Self-pay | Attending: Physical Medicine and Rehabilitation | Admitting: Registered Nurse

## 2024-03-11 DIAGNOSIS — G894 Chronic pain syndrome: Secondary | ICD-10-CM | POA: Insufficient documentation

## 2024-03-11 DIAGNOSIS — Z79891 Long term (current) use of opiate analgesic: Secondary | ICD-10-CM | POA: Insufficient documentation

## 2024-03-11 DIAGNOSIS — Z5181 Encounter for therapeutic drug level monitoring: Secondary | ICD-10-CM | POA: Insufficient documentation

## 2024-03-12 ENCOUNTER — Ambulatory Visit: Payer: Self-pay | Admitting: Emergency Medicine

## 2024-03-17 ENCOUNTER — Encounter: Payer: Self-pay | Attending: Physical Medicine and Rehabilitation | Admitting: Registered Nurse

## 2024-03-17 ENCOUNTER — Encounter: Payer: Self-pay | Admitting: Registered Nurse

## 2024-03-17 VITALS — BP 117/73 | HR 91 | Ht 68.0 in | Wt >= 6400 oz

## 2024-03-17 DIAGNOSIS — M25561 Pain in right knee: Secondary | ICD-10-CM | POA: Insufficient documentation

## 2024-03-17 DIAGNOSIS — M25562 Pain in left knee: Secondary | ICD-10-CM | POA: Insufficient documentation

## 2024-03-17 DIAGNOSIS — Z79891 Long term (current) use of opiate analgesic: Secondary | ICD-10-CM | POA: Insufficient documentation

## 2024-03-17 DIAGNOSIS — M48062 Spinal stenosis, lumbar region with neurogenic claudication: Secondary | ICD-10-CM | POA: Insufficient documentation

## 2024-03-17 DIAGNOSIS — G894 Chronic pain syndrome: Secondary | ICD-10-CM | POA: Insufficient documentation

## 2024-03-17 DIAGNOSIS — M5416 Radiculopathy, lumbar region: Secondary | ICD-10-CM | POA: Insufficient documentation

## 2024-03-17 DIAGNOSIS — Z5181 Encounter for therapeutic drug level monitoring: Secondary | ICD-10-CM | POA: Insufficient documentation

## 2024-03-17 DIAGNOSIS — G8929 Other chronic pain: Secondary | ICD-10-CM | POA: Insufficient documentation

## 2024-03-17 MED ORDER — OXYCODONE-ACETAMINOPHEN 10-325 MG PO TABS: 1.0000 | ORAL_TABLET | Freq: Every day | ORAL | 0 refills | Status: DC | PRN

## 2024-03-17 NOTE — Progress Notes (Signed)
 Subjective:    Patient ID: Anthony [name], male    DOB: [birth year], 43 y.o.   MRN: [id number]  HPI: Anthony [name] is a 43 y.o. male who returns for follow up appointment for chronic pain and medication refill. He states his pain is located in his lower back and occasionally radiating into his right lower extremity and bilateral knee pain. He rates his  pain 8. His current exercise regime is walking and performing stretching exercises.  Mr. Dalziel Morphine equivalent is 75.00 MME.   Oral Swab was performed today.    Pain Inventory Average Pain 8 Pain Right Now 8 My pain is sharp, burning, dull, stabbing, and aching  In the last 24 hours, has pain interfered with the following? General activity 10 Relation with others 10 Enjoyment of life 10 What TIME of day is your pain at its worst? morning , daytime, evening, and night Sleep (in general) Poor  Pain is worse with: walking, bending, sitting, inactivity, and standing Pain improves with: rest and medication Relief from Meds: 5  Family History  Problem Relation Age of Onset   Hypertension Father    Diabetes Father    Stroke Father    Congestive Heart Failure Father    Lymphoma Paternal Uncle    Ovarian cancer Maternal Grandmother 58   Social History   Socioeconomic History   Marital status: Married    Spouse name: Not on file   Number of children: Not on file   Years of education: Not on file   Highest education level: Not on file  Occupational History   Occupation: Disabled  Tobacco Use   Smoking status: Former    Current packs/day: 0.00    Average packs/day: 0.3 packs/day for 10.0 years (3.0 ttl pk-yrs)    Types: Cigarettes    Start date: 10/01/2006    Quit date: 09/30/2016    Years since quitting: 7.4    Passive exposure: Never   Smokeless tobacco: Never  Vaping Use   Vaping status: Never Used  Substance and Sexual Activity   Alcohol use: No   Drug use: No   Sexual activity: Yes    Birth  control/protection: Condom  Other Topics Concern   Not on file  Social History Narrative   Not on file   Social Drivers of Health   Financial Resource Strain: Not on file  Food Insecurity: Not on file  Transportation Needs: Not on file  Physical Activity: Not on file  Stress: Not on file  Social Connections: Not on file   Past Surgical History:  Procedure Laterality Date   ARTHROSCOPY KNEE W/ DRILLING     right knee    ENDOBRONCHIAL ULTRASOUND Bilateral 03/04/2017   Procedure: ENDOBRONCHIAL ULTRASOUND;  Surgeon: Shelah Lamar RAMAN, MD;  Location: [facility];  Service: Cardiopulmonary;  Laterality: Bilateral;   left knee inner growth plate removed     to straighten leg   Past Surgical History:  Procedure Laterality Date   ARTHROSCOPY KNEE W/ DRILLING     right knee    ENDOBRONCHIAL ULTRASOUND Bilateral 03/04/2017   Procedure: ENDOBRONCHIAL ULTRASOUND;  Surgeon: Shelah Lamar RAMAN, MD;  Location: [facility];  Service: Cardiopulmonary;  Laterality: Bilateral;   left knee inner growth plate removed     to straighten leg   Past Medical History:  Diagnosis Date   Anxiety    Depression    Dyspnea    Eczema of hand    Headache    hx of with  tooth pain   Obese    Pneumonia    12/2016   Pre-diabetes    Sarcoidosis    Sleep apnea    cpap   BP (!) 185/70 (BP Location: Left Arm, Patient Position: Sitting, Cuff Size: Large) Comment: 2nd BP reading  Pulse 91   Ht 5' 8 (1.727 m)   Wt (!) 566 lb 9.6 oz (257 kg)   SpO2 98%   BMI 86.15 kg/m   Opioid Risk Score:   Fall Risk Score:  `1  Depression screen [facility] 2/9     03/17/2024   10:25 AM 01/13/2024    9:25 AM 07/11/2023    8:56 AM 05/12/2023    8:26 AM 02/11/2023    9:48 AM 12/13/2022    9:40 AM 11/15/2022    9:31 AM  Depression screen PHQ 2/9  Decreased Interest 0 0 0 0 0 0 1  Down, Depressed, Hopeless 0 0 0 0 0 0 0  PHQ - 2 Score 0 0 0 0 0 0 1      Review of Systems  Musculoskeletal:  Positive for arthralgias, back  pain and myalgias.       Upper and lower back pain with sciatica radiating down right leg to foot, bilateral knee pain   All other systems reviewed and are negative.      Objective:   Physical Exam Vitals and nursing note reviewed.  Constitutional:      Appearance: Normal appearance.  Cardiovascular:     Rate and Rhythm: Normal rate and regular rhythm.     Pulses: Normal pulses.     Heart sounds: Normal heart sounds.  Pulmonary:     Effort: Pulmonary effort is normal.     Breath sounds: Normal breath sounds.  Musculoskeletal:     Comments: Normal Muscle Bulk and Muscle Testing Reveals:  Upper Extremities: Full ROM and Muscle Strength 5/5  Thoracic Paraspinal Tenderness: T-1-T-2 Lumbar Paraspinal Tenderness: L-4-L-5 Lower Extremities: Full ROM and Muscle Strength 5/5 Bilateral  Lower Extremities Flexion Produces Pain into his Bilateral Patella Arises from Table slowly Antalgic Gait     Skin:    General: Skin is warm and dry.  Neurological:     Mental Status: He is alert and oriented to person, place, and time.  Psychiatric:        Mood and Affect: Mood normal.        Behavior: Behavior normal.          Assessment & Plan:  1.Chronic Low Back Pain/ Right Lumbar Radiculitis/ Spinal Stenosis : Continue HEP as Tolerated and Continue to Monitor. 03/17/2024 Refilled: : Oxycodone  10/325 mg one tablet 5 times a day as needed for pain #150. Second script sent for the following month. We will continue the opioid monitoring program, this consists of regular clinic visits, examinations, urine drug screen, pill counts as well as use of [place]  Controlled Substance Reporting system. A 12 month History has been reviewed on the [place]  Controlled Substance Reporting System on 03/17/2024.  2. Thoracic Back Pain: .Continue HEP as Tolerated . Continue to Monitor. 03/17/2024 3. Right Shoulder Pain: No Complaints today. Continue HEP as Tolerated and Continue to  Monitor.03/17/2024 4. Chronic Pain Syndrome: Continue HEP as Tolerated. Continue to Monitor. 03/17/2024 5. Morbid Obesity: Continue Healthy Diet Regimen. Continue to Monitor. 03/17/2024 6. Chronic Bilateral Knee Pain:Continue HEP as Tolerated. Continue to Monitor. 03/17/2024 7. Left Ankle Pain: No complaints today.  Ortho Following. 03/17/2024   F/U in 2 Months

## 2024-03-22 LAB — DRUG TOX MONITOR 1 W/CONF, ORAL FLD

## 2024-03-22 LAB — DRUG TOX ALC METAB W/CON, ORAL FLD: Alcohol Metabolite: NEGATIVE ng/mL (ref <25)

## 2024-05-11 ENCOUNTER — Telehealth: Payer: Self-pay | Admitting: Registered Nurse

## 2024-05-11 ENCOUNTER — Other Ambulatory Visit: Payer: Self-pay | Admitting: Registered Nurse

## 2024-05-11 NOTE — Telephone Encounter (Signed)
 Need refill. Last sent on 02/04/24 with 2 refills. Out of refills

## 2024-05-11 NOTE — Telephone Encounter (Signed)
 Patient call wanting an update on his refill for Diclofenac . He says the request was sent a few days ago.

## 2024-05-11 NOTE — Telephone Encounter (Signed)
 Pt is waiting for their med refill and they do not have anymore for the last four days Diclofemac 75 mg

## 2024-05-12 MED ORDER — DICLOFENAC SODIUM 75 MG PO TBEC: 75.0000 mg | DELAYED_RELEASE_TABLET | Freq: Two times a day (BID) | ORAL | 2 refills | Status: AC

## 2024-05-14 ENCOUNTER — Encounter: Payer: Self-pay | Attending: Physical Medicine and Rehabilitation | Admitting: Registered Nurse

## 2024-05-17 ENCOUNTER — Telehealth: Payer: Self-pay | Admitting: Registered Nurse

## 2024-05-17 ENCOUNTER — Encounter: Payer: Self-pay | Admitting: Registered Nurse

## 2024-05-17 ENCOUNTER — Encounter: Payer: Self-pay | Attending: Physical Medicine and Rehabilitation | Admitting: Registered Nurse

## 2024-05-17 VITALS — BP 122/75 | HR 73 | Ht 68.0 in | Wt >= 6400 oz

## 2024-05-17 DIAGNOSIS — M48062 Spinal stenosis, lumbar region with neurogenic claudication: Secondary | ICD-10-CM

## 2024-05-17 DIAGNOSIS — G894 Chronic pain syndrome: Secondary | ICD-10-CM

## 2024-05-17 DIAGNOSIS — Z79891 Long term (current) use of opiate analgesic: Secondary | ICD-10-CM | POA: Insufficient documentation

## 2024-05-17 DIAGNOSIS — M25561 Pain in right knee: Secondary | ICD-10-CM | POA: Insufficient documentation

## 2024-05-17 DIAGNOSIS — G8929 Other chronic pain: Secondary | ICD-10-CM | POA: Insufficient documentation

## 2024-05-17 DIAGNOSIS — Z5181 Encounter for therapeutic drug level monitoring: Secondary | ICD-10-CM | POA: Insufficient documentation

## 2024-05-17 DIAGNOSIS — M5416 Radiculopathy, lumbar region: Secondary | ICD-10-CM | POA: Insufficient documentation

## 2024-05-17 DIAGNOSIS — M25562 Pain in left knee: Secondary | ICD-10-CM | POA: Insufficient documentation

## 2024-05-17 MED ORDER — OXYCODONE-ACETAMINOPHEN 10-325 MG PO TABS: 1.0000 | ORAL_TABLET | Freq: Every day | ORAL | 0 refills | Status: DC | PRN

## 2024-05-17 MED ORDER — OXYCODONE-ACETAMINOPHEN 10-325 MG PO TABS: 1.0000 | ORAL_TABLET | Freq: Every day | ORAL | 0 refills | Status: AC | PRN

## 2024-05-17 NOTE — Progress Notes (Signed)
 "  Subjective:    Patient ID: Anthony [name], male    DOB: 1980/07/25, 44 y.o.   MRN: [id number]  HPI: Anthony [name] is a 44 y.o. male who returns for follow up appointment for chronic pain and medication refill. He states his pain is located in his lower back and bilateral knee pain. He rates his pain 7. His current exercise regime is walking and performing stretching exercises.  Mr. Jupin Morphine equivalent is  75.00 MME.   Last Oral Swab was Performed on 03/17/2024, it was consistent.    Pain Inventory Average Pain 7 Pain Right Now 7 My pain is intermittent, sharp, burning, dull, stabbing, and aching  In the last 24 hours, has pain interfered with the following? General activity 10 Relation with others 10 Enjoyment of life 10 What TIME of day is your pain at its worst? morning , daytime, evening, and night Sleep (in general) Poor  Pain is worse with: walking, bending, sitting, inactivity, standing, and some activites Pain improves with: rest, heat/ice, therapy/exercise, and medication Relief from Meds: 5  Family History  Problem Relation Age of Onset   Hypertension Father    Diabetes Father    Stroke Father    Congestive Heart Failure Father    Lymphoma Paternal Uncle    Ovarian cancer Maternal Grandmother 15   Social History   Socioeconomic History   Marital status: Married    Spouse name: Not on file   Number of children: Not on file   Years of education: Not on file   Highest education level: Not on file  Occupational History   Occupation: Disabled  Tobacco Use   Smoking status: Former    Current packs/day: 0.00    Average packs/day: 0.3 packs/day for 10.0 years (3.0 ttl pk-yrs)    Types: Cigarettes    Start date: 10/01/2006    Quit date: 09/30/2016    Years since quitting: 7.6    Passive exposure: Never   Smokeless tobacco: Never  Vaping Use   Vaping status: Never Used  Substance and Sexual Activity   Alcohol use: No   Drug use: No   Sexual  activity: Yes    Birth control/protection: Condom  Other Topics Concern   Not on file  Social History Narrative   Not on file   Social Drivers of Health   Tobacco Use: Medium Risk (03/17/2024)   Patient History    Smoking Tobacco Use: Former    Smokeless Tobacco Use: Never    Passive Exposure: Never  Programmer, Applications: Not on file  Food Insecurity: Not on file  Transportation Needs: Not on file  Physical Activity: Not on file  Stress: Not on file  Social Connections: Not on file  Depression (PHQ2-9): Low Risk (03/17/2024)   Depression (PHQ2-9)    PHQ-2 Score: 0  Alcohol Screen: Not on file  Housing: Not on file  Utilities: Not on file  Health Literacy: Not on file   Past Surgical History:  Procedure Laterality Date   ARTHROSCOPY KNEE W/ DRILLING     right knee    ENDOBRONCHIAL ULTRASOUND Bilateral 03/04/2017   Procedure: ENDOBRONCHIAL ULTRASOUND;  Surgeon: Shelah Lamar RAMAN, MD;  Location: [facility];  Service: Cardiopulmonary;  Laterality: Bilateral;   left knee inner growth plate removed     to straighten leg   Past Surgical History:  Procedure Laterality Date   ARTHROSCOPY KNEE W/ DRILLING     right knee    ENDOBRONCHIAL ULTRASOUND Bilateral 03/04/2017  Procedure: ENDOBRONCHIAL ULTRASOUND;  Surgeon: Shelah Lamar RAMAN, MD;  Location: [facility];  Service: Cardiopulmonary;  Laterality: Bilateral;   left knee inner growth plate removed     to straighten leg   Past Medical History:  Diagnosis Date   Anxiety    Depression    Dyspnea    Eczema of hand    Headache    hx of with tooth pain   Obese    Pneumonia    12/2016   Pre-diabetes    Sarcoidosis    Sleep apnea    cpap   There were no vitals taken for this visit.  Opioid Risk Score:   Fall Risk Score:  `1  Depression screen [facility] 2/9     03/17/2024   10:25 AM 01/13/2024    9:25 AM 07/11/2023    8:56 AM 05/12/2023    8:26 AM 02/11/2023    9:48 AM 12/13/2022    9:40 AM 11/15/2022    9:31 AM   Depression screen PHQ 2/9  Decreased Interest 0 0 0 0 0 0 1  Down, Depressed, Hopeless 0 0 0 0 0 0 0  PHQ - 2 Score 0 0 0 0 0 0 1    Review of Systems  Musculoskeletal:  Positive for back pain.       Pain in both knees  All other systems reviewed and are negative.      Objective:   Physical Exam Vitals and nursing note reviewed.  Constitutional:      Appearance: Normal appearance. He is obese.  Cardiovascular:     Rate and Rhythm: Normal rate and regular rhythm.     Pulses: Normal pulses.     Heart sounds: Normal heart sounds.  Pulmonary:     Effort: Pulmonary effort is normal.     Breath sounds: Normal breath sounds.  Musculoskeletal:     Comments: Normal Muscle Bulk and Muscle Testing Reveals:  Upper Extremities: Full ROM and Muscle Strength 5/5 Lumbar Paraspinal Tenderness: L-3-L-5 Lower Extremities: Decreased ROM and Muscle Strength 5/5 Bilateral Lower Extremities Flexion Produces Pain into his Bilateral Patella's Arises from Table slowly Antalgic  Gait     Skin:    General: Skin is warm and dry.  Neurological:     Mental Status: He is alert and oriented to person, place, and time.  Psychiatric:        Mood and Affect: Mood normal.        Behavior: Behavior normal.          Assessment & Plan:  1..Chronic Low Back Pain/ Right Lumbar Radiculitis/ Spinal Stenosis : Continue HEP as Tolerated and Continue to Monitor. 05/17/2024 Refilled: : Oxycodone  10/325 mg one tablet 5 times a day as needed for pain #150. Second script sent for the following month. We will continue the opioid monitoring program, this consists of regular clinic visits, examinations, urine drug screen, pill counts as well as use of [place]  Controlled Substance Reporting system. A 12 month History has been reviewed on the [place]  Controlled Substance Reporting System on 05/17/2024.  2. Thoracic Back Pain: No complaints today. .Continue HEP as Tolerated . Continue to Monitor.  05/17/2024 3. Right Shoulder Pain: No Complaints today. Continue HEP as Tolerated and Continue to Monitor.05/17/2024 4. Chronic Pain Syndrome: Continue Diclofenac . Continue HEP as Tolerated. Continue to Monitor. 05/17/2024 5. Morbid Obesity: Continue Healthy Diet Regimen. Continue to Monitor. 05/17/2024 6. Chronic Bilateral Knee Pain:Continue HEP as Tolerated. Continue to Monitor. 05/17/2024 7. Left Ankle  Pain: No complaints today.  Ortho Following. 05/17/2024   F/U in 2 Months                     "

## 2024-05-17 NOTE — Telephone Encounter (Signed)
 Oxycodone  is out of stock and needs to be sent to new location at [facility]

## 2024-05-17 NOTE — Telephone Encounter (Signed)
 PDMP was Reviewed.  [facility] out of stock : Oxycodone   Oxycodone  prescription sent to Pharmacy and Mr. Shear is aware of the above.

## 2024-05-28 ENCOUNTER — Encounter: Payer: Self-pay | Admitting: Emergency Medicine

## 2024-05-28 ENCOUNTER — Ambulatory Visit: Payer: Self-pay | Admitting: Emergency Medicine

## 2024-07-12 ENCOUNTER — Encounter: Payer: Self-pay | Attending: Physical Medicine and Rehabilitation | Admitting: Registered Nurse
